# Patient Record
Sex: Male | Born: 1935 | Race: White | Hispanic: No | Marital: Married | Smoking: Former smoker
Health system: Southern US, Community
[De-identification: ages and names within clinical notes are randomized; demographics above are authoritative.]

## PROBLEM LIST (undated history)

## (undated) DIAGNOSIS — E039 Hypothyroidism, unspecified: Secondary | ICD-10-CM

## (undated) DIAGNOSIS — D649 Anemia, unspecified: Secondary | ICD-10-CM

## (undated) DIAGNOSIS — C801 Malignant (primary) neoplasm, unspecified: Secondary | ICD-10-CM

## (undated) DIAGNOSIS — F0390 Unspecified dementia without behavioral disturbance: Secondary | ICD-10-CM

---
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
## 2006-02-07 DIAGNOSIS — C801 Malignant (primary) neoplasm, unspecified: Secondary | ICD-10-CM

## 2006-02-07 HISTORY — DX: Malignant (primary) neoplasm, unspecified: C80.1

## 2015-12-08 DIAGNOSIS — E559 Vitamin D deficiency, unspecified: Secondary | ICD-10-CM | POA: Insufficient documentation

## 2015-12-08 DIAGNOSIS — E039 Hypothyroidism, unspecified: Secondary | ICD-10-CM | POA: Insufficient documentation

## 2015-12-09 DIAGNOSIS — R3129 Other microscopic hematuria: Secondary | ICD-10-CM | POA: Insufficient documentation

## 2015-12-09 DIAGNOSIS — Z66 Do not resuscitate: Secondary | ICD-10-CM | POA: Insufficient documentation

## 2015-12-09 DIAGNOSIS — R0989 Other specified symptoms and signs involving the circulatory and respiratory systems: Secondary | ICD-10-CM | POA: Insufficient documentation

## 2016-02-23 DIAGNOSIS — L309 Dermatitis, unspecified: Secondary | ICD-10-CM | POA: Insufficient documentation

## 2016-04-15 DIAGNOSIS — H6123 Impacted cerumen, bilateral: Secondary | ICD-10-CM | POA: Insufficient documentation

## 2017-05-09 DIAGNOSIS — K521 Toxic gastroenteritis and colitis: Secondary | ICD-10-CM | POA: Insufficient documentation

## 2017-05-09 DIAGNOSIS — T451X5A Adverse effect of antineoplastic and immunosuppressive drugs, initial encounter: Secondary | ICD-10-CM | POA: Insufficient documentation

## 2017-05-09 DIAGNOSIS — R7301 Impaired fasting glucose: Secondary | ICD-10-CM | POA: Insufficient documentation

## 2017-05-09 DIAGNOSIS — F0280 Dementia in other diseases classified elsewhere without behavioral disturbance: Secondary | ICD-10-CM | POA: Insufficient documentation

## 2018-01-12 DIAGNOSIS — Z7189 Other specified counseling: Secondary | ICD-10-CM | POA: Insufficient documentation

## 2018-02-26 ENCOUNTER — Telehealth: Payer: Self-pay | Admitting: Hematology

## 2018-02-26 NOTE — Telephone Encounter (Signed)
Pt's wife cld back to reschedule her husband Dr. Irene Limbo on 2/5 at 11am.

## 2018-02-26 NOTE — Telephone Encounter (Signed)
A new patient appt has been scheduled for the pt to see Dr. Irene Limbo on 2/4 at 11am. Appt date and time has been given to the pt's wife and aware to arrive 30 minutes early to be checked in on time.

## 2018-03-13 ENCOUNTER — Encounter: Payer: Self-pay | Admitting: Hematology

## 2018-03-13 NOTE — Progress Notes (Signed)
HEMATOLOGY/ONCOLOGY CONSULTATION NOTE  Date of Service: 03/14/2018  Patient Care Team: Patient, No Pcp Per as PCP - General (General Practice)  CHIEF COMPLAINTS/PURPOSE OF CONSULTATION:  Myelodysplastic Syndrome  Oncologic History:   Shaun Hobbs initially presented to care with pancytopenia and was evaluated with a BM Bx on 05/16/06 which did not reveal evidence of abnormal myeloid maturation or other abnormalities, and was without chromosomal abnormality as well. A BM Bx was repeated on 08/28/07 which did reveal dyserythropoiesis and some dysplasia. A 04/17/12 Bm biopsy revealed mild dysplasia, hypercellular marrow with 1.5% blasts, and a minute CD5 positive monoclonal B-cell population detected by flow. 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 His most recent 12/27/17 Bm Bx revealed 30% cellularity with trilineage hematopoiesis, mild megakaryocytic hyperplasia with dyspoietic changes, mild erythroid hyperplasia with ringed sideroblasts, and a minute population of monoclonal B-cells detected by flow.   The pt started 45m Revlimid for 3 months, beginning 09/03/10 "without noticeable improvement." He later began Vidaza, and completed his 21st cycle the week of November 30, 2017.  HISTORY OF PRESENTING ILLNESS:   Shaun GANAWAYis a wonderful 85y.o. male who [facility]el[place] Syndrome. He is accompanied today by his wife and daughter. The pt reports that he is doing well overall.  The pt's wife reports that the first thing that occurred related to the patient's MDS was that his PLT were seen to be reduced to 99k, 12 years ago. The pt was evaluated with repeat BM biopsies over the last 12 years, and began  Revlimid for only 3 months in 2012. The pt's wife notes that he began Vidaza a little less than two years ago, after his PLT dropped to about 60k, and his last dose of Vidaza was in late October 2019. The pt has had one blood transfusion ever, which was in November 2019. The pt's wife notes that during this time of not taking Vidaza, she has not noticed a change in her husband's energy levels or day to day activities. The pt denies dizziness, light headedness, fatigue, abnormal bruising, nose bleeds, gum bleeds, or other concerns for bleeding. The pt and wife deny recent or frequent infections.  The pt's wife notes that Vidaza was stopped to "take a break," trend his counts, [place] His last BM Bx was November 2019.  The pt and his wife note that there have not been other recent medical concerns. She notes that the pt's last A1C was 4.9.  The pt's wife notes that he is current with his annual flu vaccine, both every 5-year Prevnar and Pneumovax, and shingles vaccine.  The pt's wife notes that the pt's memory is "slowly decreasing," and he no longer drives. The pt's [facility]," and has taken Aricept for 20 years.  Most recent lab results (02/19/18) of CBC w/diff and CMP is as follows: all values are WNL except for WBC at 1.5k, RBC at 2.42, HGB at 7.9, HCT at 23.3, MCH at 32.2, PLT at 28k, RDW at 18.8, ANC at 700, Lymphocytes at 600, Monocytes at 200, Glucose at 111, BUN at 28.  On review of systems, pt  reports stable energy levels, eating well, stable weight, good appetite, and denies fevers, fatigue, light headedness, dizziness, nose bleeds, gum bleeds, abnormal bruising, other concerns for bleeding, falls, recent infections, frequent infections, bone pains, problems passing urine, abdominal pains, lower abdominal pains, and any other symptoms.  On PMHx the pt reports MDS, HLD, Early onset  Alzheimer's dementia, Hypothyroidism, hypertrophy or prostate with urinary obstruction.  MEDICAL HISTORY:  MDS, HLD, Early onset Alzheimer's dementia, Hypothyroidism, hypertrophy or prostate with urinary obstruction.  SURGICAL HISTORY: BM Bx  SOCIAL HISTORY: Social History   Socioeconomic History  . Marital status: Married    Spouse name: Not on file  . Number of children: Not on file  . Years of education: Not on file  . Highest education level: Not on file  Occupational History  . Not on file  Social Needs  . Financial resource strain: Not on file  . Food insecurity:    Worry: Not on file    Inability: Not on file  . Transportation needs:    Medical: Not on file    Non-medical: Not on file  Tobacco Use  . Smoking status: Not on file  Substance and Sexual Activity  . Alcohol use: Not on file  . Drug use: Not on file  . Sexual activity: Not on file  Lifestyle  . Physical activity:    Days per week: Not on file    Minutes per session: Not on file  . Stress: Not on file  Relationships  . Social connections:    Talks on phone: Not on file    Gets together: Not on file    Attends religious service: Not on file    Active member of club or organization: Not on file    Attends meetings of clubs or organizations: Not on file    Relationship status: Not on file  . Intimate partner violence:    Fear of current or ex partner: Not on file    Emotionally abused: Not on file    Physically abused: Not on file    Forced sexual activity: Not on file  Other Topics Concern  . Not on file  Social History Narrative  . Not on file    FAMILY HISTORY: No family history on file.  ALLERGIES:  has no allergies on file.  MEDICATIONS:  No current outpatient medications on file.   No current facility-administered medications for this visit.     REVIEW OF SYSTEMS:    10 Point review of Systems was done is negative except as noted above.  PHYSICAL EXAMINATION: ECOG  PERFORMANCE STATUS: 2-3   . Vitals:   03/14/18 1058  BP: (!) 109/52  Pulse: 82  Resp: 18  Temp: (!) 97.4 F (36.3 C)  SpO2: 100%   Filed Weights   03/14/18 1058  Weight: 167 lb 11.2 oz (76.1 kg)   .Body mass index is 26.27 kg/m.  GENERAL:alert, in no acute distress and comfortable SKIN: no acute rashes, no significant lesions EYES: conjunctiva are pink and non-injected, sclera anicteric OROPHARYNX: MMM, no exudates, no oropharyngeal erythema or ulceration NECK: supple, no JVD LYMPH:  no palpable lymphadenopathy in the cervical, axillary or inguinal regions LUNGS: clear to auscultation b/l with normal respiratory effort HEART: regular rate & rhythm ABDOMEN:  normoactive bowel sounds , non tender, not distended. Extremity: no pedal edema PSYCH: alert & oriented x 3 with fluent speech NEURO: no focal motor/sensory deficits  LABORATORY DATA:  I have reviewed the data as listed  .  No flowsheet data found.  .No flowsheet data found.  CBC w/diff and CMP:    11/19/15 BM Bx:   11/19/15 Cytogenetics:   12/27/17 Bm Bx:    RADIOGRAPHIC STUDIES: I have personally reviewed the radiological images as listed and agreed with the findings in the report. No results found.  ASSESSMENT & PLAN:  85 y.o. male with  1. 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 No evidence for a lymphoproliferative disorder. Normal cytogenetics. 08/28/07 BM Bx revealed mild dyserythropoiesis and a suggestion of some dysplasia within the myeloid series. 04/17/12 BM Bx revealed hypercellular marrow with 50% cellularity, 1.5% blasts, with trilineage hematopoiesis, marrow monocytosis, mild myeloid and megakaryocytic dysplasia, adequate storage and iron utilization, no ring sideroblasts.  Small monoclonal B lymphocytic population of uncertain significance. 11/19/15 BM Bx revealed Hypercellular marrow for age about 70% with panmyelosis including increased megakaryocytes with atypia, moderate marrow monocytosis about 15-20% and <5% blasts. Minute CD5 positive monoclonal B-cell population. Storage iron present. 12/27/17 BM Bx revealed normocellular marrow for age with 30% cellularity and trilineage hematopoiesis, mild megakaryocytic hyperplasia with dyspoietic changes, mild erythroid hyperplasia with ringed sideroblasts, and minute population of monoclonal B cells detected by flow.  S/p 21 cycles of Vidaza completed in the week of November 30, 2017  PLAN: -Discussed patient's most recent labs from 02/19/18, WBC at 1.5k, HGB at 7.9, PLT at 28k, [place] at 700 -Pt has not been transfusion dependent, and has not had frequent nor recent infections -Previous oncologic notes suggest that the patient's pancytopenia was continuing to worsen through [facility] treatment -Discussed that as the pt has completed 21 cycles of Vidaza without overt blood count improvement, does not have excess blasts, and has not been transfusion dependent, a supportive care approach could be indicated at this time as opposed to returning to [place] -The pt and his family agree with this suggestion, and we will continue to trend out his blood counts and offer supportive therapies and blood transfusions as needed -transfuse PRBC for hgb <+7 or if symptomatic and PLT<= 10k or if bleeding. -avoid NSAIDS and other medications that could lower platelet counts or affect platelet function. -Advised that the pt prioritize establishing care with a PCP locally -Advised that pt continue to stay up to date with flu vaccine, Prevnar, Pneumovax, and Shignles vaccines -Will order labs today -Will see the pt back in 6 weeks with labs   Labs today RTC with Dr Irene Limbo with labs in 6 weeks (with appointment for 1 unit of PRBC if needed)   All  of the patients questions were answered with apparent satisfaction. The patient knows to call the clinic with any problems, questions or concerns.  The total time spent in the appt was 65 minutes and more than 50% was on counseling and direct patient cares.    Sullivan Lone MD MS AAHIVMS [facility] Hematology/Oncology Physician [facility]  (Office):       [phone] (Work cell):  [phone] (Fax):           [phone]  03/14/2018 12:12 PM  I, Baldwin [place], am acting as a scribe for Dr. Sullivan Lone.   .I have reviewed the above documentation for accuracy and completeness, and I agree with the above. Brunetta Genera MD

## 2018-03-14 ENCOUNTER — Inpatient Hospital Stay: Payer: Medicare Other | Attending: Hematology | Admitting: Hematology

## 2018-03-14 ENCOUNTER — Telehealth: Payer: Self-pay | Admitting: Hematology

## 2018-03-14 ENCOUNTER — Inpatient Hospital Stay: Payer: Medicare Other

## 2018-03-14 VITALS — BP 109/52 | HR 82 | Temp 97.4°F | Resp 18 | Ht 67.0 in | Wt 167.7 lb

## 2018-03-14 DIAGNOSIS — D469 Myelodysplastic syndrome, unspecified: Secondary | ICD-10-CM

## 2018-03-14 DIAGNOSIS — D61818 Other pancytopenia: Secondary | ICD-10-CM

## 2018-03-14 LAB — SAMPLE TO BLOOD BANK

## 2018-03-14 LAB — LACTATE DEHYDROGENASE: LDH: 190 U/L (ref 98–192)

## 2018-03-14 LAB — CMP (CANCER CENTER ONLY)
ALT: 9 U/L (ref 0–44)
AST: 18 U/L (ref 15–41)
Albumin: 4.1 g/dL (ref 3.5–5.0)
Alkaline Phosphatase: 54 U/L (ref 38–126)
Anion gap: 6 (ref 5–15)
BUN: 28 mg/dL — ABNORMAL HIGH (ref 8–23)
CO2: 27 mmol/L (ref 22–32)
Calcium: 8.6 mg/dL — ABNORMAL LOW (ref 8.9–10.3)
Chloride: 108 mmol/L (ref 98–111)
Creatinine: 0.98 mg/dL (ref 0.61–1.24)
GFR, Est AFR Am: >60 mL/min (ref >60)
GFR, Estimated: >60 mL/min (ref >60)
Glucose, Bld: 134 mg/dL — ABNORMAL HIGH (ref 70–99)
Potassium: 4.4 mmol/L (ref 3.5–5.1)
Sodium: 141 mmol/L (ref 135–145)
Total Bilirubin: 0.7 mg/dL (ref 0.3–1.2)
Total Protein: 6.2 g/dL — ABNORMAL LOW (ref 6.5–8.1)

## 2018-03-14 LAB — CBC WITH DIFFERENTIAL/PLATELET
Abs Immature Granulocytes: 0 10*3/uL (ref 0.00–0.07)
Basophils Absolute: 0 10*3/uL (ref 0.0–0.1)
Basophils Relative: 0 %
Eosinophils Absolute: 0 10*3/uL (ref 0.0–0.5)
Eosinophils Relative: 1 %
HCT: 24.5 % — ABNORMAL LOW (ref 39.0–52.0)
Hemoglobin: 7.6 g/dL — ABNORMAL LOW (ref 13.0–17.0)
Immature Granulocytes: 0 %
Lymphocytes Relative: 39 %
Lymphs Abs: 0.6 10*3/uL — ABNORMAL LOW (ref 0.7–4.0)
MCH: 31.8 pg (ref 26.0–34.0)
MCHC: 31 g/dL (ref 30.0–36.0)
MCV: 102.5 fL — ABNORMAL HIGH (ref 80.0–100.0)
Monocytes Absolute: 0.2 10*3/uL (ref 0.1–1.0)
Monocytes Relative: 10 %
Neutro Abs: 0.8 10*3/uL — ABNORMAL LOW (ref 1.7–7.7)
Neutrophils Relative %: 50 %
Platelets: 12 10*3/uL — ABNORMAL LOW (ref 150–400)
RBC: 2.39 MIL/uL — ABNORMAL LOW (ref 4.22–5.81)
RDW: 23 % — ABNORMAL HIGH (ref 11.5–15.5)
WBC: 1.5 10*3/uL — ABNORMAL LOW (ref 4.0–10.5)
nRBC: 0 % (ref 0.0–0.2)

## 2018-03-14 LAB — RETICULOCYTES: RBC.: 2.39 MIL/uL — ABNORMAL LOW (ref 4.22–5.81)

## 2018-03-14 LAB — FERRITIN: Ferritin: 227 ng/mL (ref 24–336)

## 2018-03-14 LAB — IRON AND TIBC
Iron: 234 ug/dL — ABNORMAL HIGH (ref 42–163)
Saturation Ratios: 85 % — ABNORMAL HIGH (ref 20–55)
TIBC: 276 ug/dL (ref 202–409)
UIBC: 43 ug/dL — ABNORMAL LOW (ref 117–376)

## 2018-03-14 NOTE — Telephone Encounter (Signed)
Scheduled appt per 02/05 los. ° °Printed calendar and avs. °

## 2018-03-15 LAB — ERYTHROPOIETIN: Erythropoietin: 47.6 m[IU]/mL — ABNORMAL HIGH (ref 2.6–18.5)

## 2018-04-24 NOTE — Progress Notes (Signed)
HEMATOLOGY/ONCOLOGY CLINIC NOTE  Date of Service: 04/25/2018  Patient Care Team: Patient, No Pcp Per as PCP - General (General Practice)  CHIEF COMPLAINTS/PURPOSE OF CONSULTATION:  Myelodysplastic Syndrome  Oncologic History:   Shaun Hobbs initially presented to care with pancytopenia and was evaluated with a BM Bx on 05/16/06 which did not reveal evidence of abnormal myeloid maturation or other abnormalities, and was without chromosomal abnormality as well. A BM Bx was repeated on 08/28/07 which did reveal dyserythropoiesis and some dysplasia. A 04/17/12 Bm biopsy revealed mild dysplasia, hypercellular marrow with 1.5% blasts, and a minute CD5 positive monoclonal B-cell population detected by flow. 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 His most recent 12/27/17 Bm Bx revealed 30% cellularity with trilineage hematopoiesis, mild megakaryocytic hyperplasia with dyspoietic changes, mild erythroid hyperplasia with ringed sideroblasts, and a minute population of monoclonal B-cells detected by flow.   The pt started 68m Revlimid for 3 months, beginning 09/03/10 "without noticeable improvement." He later began Vidaza, and completed his 21st cycle the week of November 30, 2017.  HISTORY OF PRESENTING ILLNESS:   Shaun DOMANGUEis a wonderful 85y.o. male who [facility] [place] [facility] [place] [facility] [place]. He is accompanied today by his wife and daughter. The pt reports that he is doing well overall.  The pt's wife reports that the first thing that occurred related to the patient's MDS was that his PLT were seen to be reduced to 99k, 12 years ago. The pt was evaluated with repeat BM biopsies over the last 12 years, and began Revlimid  for only 3 months in 2012. The pt's wife notes that he began Vidaza a little less than two years ago, after his PLT dropped to about 60k, and his last dose of Vidaza was in late October 2019. The pt has had one blood transfusion ever, which was in November 2019. The pt's wife notes that during this time of not taking Vidaza, she has not noticed a change in her husband's energy levels or day to day activities. The pt denies dizziness, light headedness, fatigue, abnormal bruising, nose bleeds, gum bleeds, or other concerns for bleeding. The pt and wife deny recent or frequent infections.  The pt's wife notes that Vidaza was stopped to "take a break," trend his counts, and to [place] His last BM Bx was November 2019.  The pt and his wife note that there have not been other recent medical concerns. She notes that the pt's last A1C was 4.9.  The pt's wife notes that he is current with his annual flu vaccine, both every 5-year Prevnar and Pneumovax, and shingles vaccine.  The pt's wife notes that the pt's memory is "slowly decreasing," and he no longer drives. The pt's [facility]," and has taken Aricept for 20 years.  Most recent lab results (02/19/18) of CBC w/diff and CMP is as follows: all values are WNL except for WBC at 1.5k, RBC at 2.42, HGB at 7.9, HCT at 23.3, MCH at 32.2, PLT at 28k, RDW at 18.8, ANC at 700, Lymphocytes at 600, Monocytes at 200, Glucose at 111, BUN at 28.  On review of systems, pt  reports stable energy levels, eating well, stable weight, good appetite, and denies fevers, fatigue, light headedness, dizziness, nose bleeds, gum bleeds, abnormal bruising, other concerns for bleeding, falls, recent infections, frequent infections, bone pains, problems passing urine, abdominal pains, lower abdominal pains, and any other symptoms.  On PMHx the pt reports MDS, HLD, Early onset Alzheimer's  dementia, Hypothyroidism, hypertrophy or prostate with urinary obstruction.  Interval History:   Shaun Hobbs returns today for management and evaluation of his MDS. The patient's last visit with [place] was on 03/14/18. He is accompanied today by his wife and daughter. The pt reports that he is doing well overall.   The pt notes that he has not developed any new concerns. He denies new fatigue, light headedness, or dizziness. The pt also denies any nose bleeds, gum bleeds, or blood in the stools. The pt's wife reports that he was seen to have a drop of blood within his eye after seeing his ophthalmologist, but notes that there was not anything recommended for this and will continue following with ophthalmology.   The pt's wife notes that she has seen a decrease in the patient's energy levels and has had some SOB after walking a couple blocks. She notes that they walk regularly, but the pt has not been able to walk as far in the last 1-2 weeks.   The pt has not begun any new medications, and denies taking any NSAIDs.   The pt and his wife note that he eats well, and has walked outside every day.  Lab results today (04/25/18) of CBC w/diff and CMP is as follows: all values are WNL except for WBC at 1.1k, RBC at 2.02, HGB at 6.3, HCT at 21.3, MCV at 105.4, MCHC at 29.6, RDW at 26.2, PLT at 13k, ANC at 500, Lymphs abs at 500, Glucose at 143, Calcium at 8.5, Total Protein at 6.0, AST at 14.  On review of systems, pt reports eating well, continuing with mild activity, moving his bowels well, and denies light headedness, dizziness, concerns for bleeding, nose bleeds, gum bleeds, concerns for infection, recent infections, and any other symptoms.   MEDICAL HISTORY:  MDS, HLD, Early onset Alzheimer's dementia, Hypothyroidism, hypertrophy or prostate with urinary obstruction.  SURGICAL HISTORY: BM Bx  SOCIAL HISTORY: Social History   Socioeconomic History   Marital status: Married    Spouse name: Not  on file   Number of children: Not on file   Years of education: Not on file   Highest education level: Not on file  Occupational History   Not on file  Social Needs   Financial resource strain: Not on file   Food insecurity:    Worry: Not on file    Inability: Not on file   Transportation needs:    Medical: Not on file    Non-medical: Not on file  Tobacco Use   Smoking status: Not on file  Substance and Sexual Activity   Alcohol use: Not on file   Drug use: Not on file   Sexual activity: Not on file  Lifestyle   Physical activity:    Days per week: Not on file    Minutes per session: Not on file   Stress: Not on file  Relationships   Social connections:    Talks on phone: Not on file    Gets together: Not on file    Attends religious service: Not on file    Active member of club or organization: Not on file  Attends meetings of clubs or organizations: Not on file    Relationship status: Not on file   Intimate partner violence:    Fear of current or ex partner: Not on file    Emotionally abused: Not on file    Physically abused: Not on file    Forced sexual activity: Not on file  Other Topics Concern   Not on file  Social History Narrative   Not on file    FAMILY HISTORY: No family history on file.  ALLERGIES:  has no allergies on file.  MEDICATIONS:  No current outpatient medications on file.   No current facility-administered medications for this visit.     REVIEW OF SYSTEMS:    A 10+ POINT REVIEW OF SYSTEMS WAS OBTAINED including neurology, dermatology, psychiatry, cardiac, respiratory, lymph, extremities, GI, GU, Musculoskeletal, constitutional, breasts, reproductive, HEENT.  All pertinent positives are noted in the HPI.  All others are negative.   PHYSICAL EXAMINATION: ECOG PERFORMANCE STATUS: 2-3   . Vitals:   04/25/18 0944  BP: (!) 113/50  Pulse: 70  Resp: 17  Temp: 97.6 F (36.4 C)  SpO2: 100%   Filed Weights    04/25/18 0944  Weight: 167 lb 4.8 oz (75.9 kg)   .Body mass index is 26.2 kg/m.  GENERAL:alert, in no acute distress and comfortable SKIN: no acute rashes, no significant lesions EYES: conjunctiva are pink and non-injected, sclera anicteric OROPHARYNX: MMM, no exudates, no oropharyngeal erythema or ulceration NECK: supple, no JVD LYMPH:  no palpable lymphadenopathy in the cervical, axillary or inguinal regions LUNGS: clear to auscultation b/l with normal respiratory effort HEART: regular rate & rhythm ABDOMEN:  normoactive bowel sounds , non tender, not distended. No palpable hepatosplenomegaly.  Extremity: no pedal edema PSYCH: alert & oriented x 3 with fluent speech NEURO: no focal motor/sensory deficits   LABORATORY DATA:  I have reviewed the data as listed  . CBC Latest Ref Rng & Units 04/25/2018 03/14/2018  WBC 4.0 - 10.5 K/uL 1.1(L) 1.5(L)  Hemoglobin 13.0 - 17.0 g/dL 6.3(LL) 7.6(L)  Hematocrit 39.0 - 52.0 % 21.3(L) 24.5(L)  Platelets 150 - 400 K/uL 13(L) 12(L)   ANC 500 . CMP Latest Ref Rng & Units 04/25/2018 03/14/2018  Glucose 70 - 99 mg/dL 143(H) 134(H)  BUN 8 - 23 mg/dL 23 28(H)  Creatinine 0.61 - 1.24 mg/dL 1.01 0.98  Sodium 135 - 145 mmol/L 140 141  Potassium 3.5 - 5.1 mmol/L 4.2 4.4  Chloride 98 - 111 mmol/L 107 108  CO2 22 - 32 mmol/L 24 27  Calcium 8.9 - 10.3 mg/dL 8.5(L) 8.6(L)  Total Protein 6.5 - 8.1 g/dL 6.0(L) 6.2(L)  Total Bilirubin 0.3 - 1.2 mg/dL 0.9 0.7  Alkaline Phos 38 - 126 U/L 48 54  AST 15 - 41 U/L 14(L) 18  ALT 0 - 44 U/L 8 9    CBC w/diff and CMP:    11/19/15 BM Bx:   11/19/15 Cytogenetics:   12/27/17 Bm Bx:    RADIOGRAPHIC STUDIES: I have personally reviewed the radiological images as listed and agreed with the findings in the report. No results found.  ASSESSMENT & PLAN:  85 y.o. male with  1. Myelodysplastic syndrome - IPSS score of 2, with copy-neutral loss of heterozygosity in 7q consistent with myeloid lineage  clone  05/16/06 BM Bx indicated by pancytopenia but revealed no evidence of abnormal myeloid maturation or an increased blast population. No evidence for a lymphoproliferative disorder. Normal cytogenetics. 08/28/07 BM Bx revealed mild dyserythropoiesis  and a suggestion of some dysplasia within the myeloid series. 04/17/12 BM Bx revealed hypercellular marrow with 50% cellularity, 1.5% blasts, with trilineage hematopoiesis, marrow monocytosis, mild myeloid and megakaryocytic dysplasia, adequate storage and iron utilization, no ring sideroblasts. Small monoclonal B lymphocytic population of uncertain significance. 11/19/15 BM Bx revealed Hypercellular marrow for age about 70% with panmyelosis including increased megakaryocytes with atypia, moderate marrow monocytosis about 15-20% and <5% blasts. Minute CD5 positive monoclonal B-cell population. Storage iron present. 12/27/17 BM Bx revealed normocellular marrow for age with 30% cellularity and trilineage hematopoiesis, mild megakaryocytic hyperplasia with dyspoietic changes, mild erythroid hyperplasia with ringed sideroblasts, and minute population of monoclonal B cells detected by flow.  S/p 21 cycles of Vidaza completed in the week of November 30, 2017. Previous oncologic notes suggest that the patient's pancytopenia was continuing to worsen through [place] treatment.  Labs upon initial presentation from 02/19/18, WBC at 1.5k, HGB at 7.9, PLT at 28k, [place] at 700  PLAN: -Discussed pt labwork today, 04/25/18; HGB worsened to 6.3, PLT at 13k, ANC at 500 without recent infections -Will order 2 units PRBCs today -Will order 1 unit of platelets as well -Will continue with supportive cares as necessary -Recommend crowd avoidance and infection prevention strategies -Will consider prophylactic anti-microbials if ANC persists to decrease, but will continue to watch closely -Discussed that at any point, the pt and his family can choose to focus purely on comfort  measures with hospice services. -Pt has not been transfusion dependent, and has not had frequent nor recent infections -Discussed again that as the pt has completed 21 cycles of Vidaza without overt blood count improvement, does not have excess blasts, and has not been transfusion dependent, a supportive care approach could be indicated at this time as opposed to returning to [place]. The pt and his family agree with this suggestion, and we will continue to trend out his blood counts and offer supportive therapies and blood transfusions as needed -Transfuse PRBC for hgb <,=7 or if symptomatic and PLT<= 10k or if bleeding. -Avoid NSAIDS and other medications that could lower platelet counts or affect platelet function. -Advised that the pt prioritize establishing care with a PCP locally -Advised that pt continue to stay up to date with flu vaccine, Prevnar, Pneumovax, and Shignles vaccines -Will check labs again in 4 weeks with tentative appointment for blood transfusion  -Will see the pt back in 2 months   Labs and appointment for 2 units of PRBCs in 1 months RTC with Dr Irene Limbo with labs and appointment for 2 units of PRBCs in 2 months   All of the patients questions were answered with apparent satisfaction. The patient knows to call the clinic with any problems, questions or concerns.  The total time spent in the appt was 25 minutes and more than 50% was on counseling and direct patient cares.    Sullivan Lone MD MS AAHIVMS [facility] Hematology/Oncology Physician [facility]  (Office):       [phone] (Work cell):  [phone] (Fax):           [phone]  04/25/2018 10:29 AM  I, Baldwin Jamaica, am acting as a scribe for Dr. Sullivan Lone.   .I have reviewed the above documentation for accuracy and completeness, and I agree with the above. Brunetta Genera MD

## 2018-04-25 ENCOUNTER — Inpatient Hospital Stay: Payer: Medicare Other | Attending: Hematology | Admitting: Hematology

## 2018-04-25 ENCOUNTER — Inpatient Hospital Stay: Payer: Medicare Other

## 2018-04-25 ENCOUNTER — Other Ambulatory Visit: Payer: Self-pay

## 2018-04-25 ENCOUNTER — Other Ambulatory Visit: Payer: Self-pay | Admitting: *Deleted

## 2018-04-25 ENCOUNTER — Telehealth: Payer: Self-pay | Admitting: Hematology

## 2018-04-25 VITALS — BP 113/50 | HR 70 | Temp 97.6°F | Resp 17 | Ht 67.0 in | Wt 167.3 lb

## 2018-04-25 DIAGNOSIS — D696 Thrombocytopenia, unspecified: Secondary | ICD-10-CM

## 2018-04-25 DIAGNOSIS — D649 Anemia, unspecified: Secondary | ICD-10-CM

## 2018-04-25 DIAGNOSIS — D469 Myelodysplastic syndrome, unspecified: Secondary | ICD-10-CM

## 2018-04-25 DIAGNOSIS — D61818 Other pancytopenia: Secondary | ICD-10-CM

## 2018-04-25 DIAGNOSIS — E039 Hypothyroidism, unspecified: Secondary | ICD-10-CM | POA: Insufficient documentation

## 2018-04-25 LAB — CMP (CANCER CENTER ONLY)
ALT: 8 U/L (ref 0–44)
AST: 14 U/L — ABNORMAL LOW (ref 15–41)
Albumin: 4 g/dL (ref 3.5–5.0)
Alkaline Phosphatase: 48 U/L (ref 38–126)
Anion gap: 9 (ref 5–15)
BUN: 23 mg/dL (ref 8–23)
CO2: 24 mmol/L (ref 22–32)
Calcium: 8.5 mg/dL — ABNORMAL LOW (ref 8.9–10.3)
Chloride: 107 mmol/L (ref 98–111)
Creatinine: 1.01 mg/dL (ref 0.61–1.24)
GFR, Est AFR Am: >60 mL/min (ref >60)
GFR, Estimated: >60 mL/min (ref >60)
Glucose, Bld: 143 mg/dL — ABNORMAL HIGH (ref 70–99)
Potassium: 4.2 mmol/L (ref 3.5–5.1)
Sodium: 140 mmol/L (ref 135–145)
Total Bilirubin: 0.9 mg/dL (ref 0.3–1.2)
Total Protein: 6 g/dL — ABNORMAL LOW (ref 6.5–8.1)

## 2018-04-25 LAB — CBC WITH DIFFERENTIAL/PLATELET
Abs Immature Granulocytes: 0 10*3/uL (ref 0.00–0.07)
Basophils Absolute: 0 10*3/uL (ref 0.0–0.1)
Basophils Relative: 0 %
Eosinophils Absolute: 0 10*3/uL (ref 0.0–0.5)
Eosinophils Relative: 1 %
HCT: 21.3 % — ABNORMAL LOW (ref 39.0–52.0)
Hemoglobin: 6.3 g/dL — CL (ref 13.0–17.0)
Immature Granulocytes: 0 %
Lymphocytes Relative: 46 %
Lymphs Abs: 0.5 10*3/uL — ABNORMAL LOW (ref 0.7–4.0)
MCH: 31.2 pg (ref 26.0–34.0)
MCHC: 29.6 g/dL — ABNORMAL LOW (ref 30.0–36.0)
MCV: 105.4 fL — ABNORMAL HIGH (ref 80.0–100.0)
Monocytes Absolute: 0.1 10*3/uL (ref 0.1–1.0)
Monocytes Relative: 8 %
Neutro Abs: 0.5 10*3/uL — ABNORMAL LOW (ref 1.7–7.7)
Neutrophils Relative %: 45 %
Platelets: 13 10*3/uL — ABNORMAL LOW (ref 150–400)
RBC: 2.02 MIL/uL — ABNORMAL LOW (ref 4.22–5.81)
RDW: 26.2 % — ABNORMAL HIGH (ref 11.5–15.5)
WBC: 1.1 10*3/uL — ABNORMAL LOW (ref 4.0–10.5)
nRBC: 0 % (ref 0.0–0.2)

## 2018-04-25 LAB — SAMPLE TO BLOOD BANK

## 2018-04-25 LAB — ABO/RH: ABO/RH(D): A POS

## 2018-04-25 LAB — PREPARE RBC (CROSSMATCH)

## 2018-04-25 MED ORDER — SODIUM CHLORIDE 0.9% IV SOLUTION
250.0000 mL | Freq: Once | INTRAVENOUS | Status: AC
  Administered 2018-04-25: 250 mL via INTRAVENOUS
  Filled 2018-04-25: qty 250

## 2018-04-25 MED ORDER — DIPHENHYDRAMINE HCL 25 MG PO CAPS
25.0000 mg | ORAL_CAPSULE | Freq: Once | ORAL | Status: AC
  Administered 2018-04-25: 25 mg via ORAL

## 2018-04-25 MED ORDER — FUROSEMIDE 10 MG/ML IJ SOLN
20.0000 mg | Freq: Once | INTRAMUSCULAR | Status: AC
  Administered 2018-04-25: 20 mg via INTRAVENOUS

## 2018-04-25 MED ORDER — METHYLPREDNISOLONE SODIUM SUCC 40 MG IJ SOLR
INTRAMUSCULAR | Status: AC
  Filled 2018-04-25: qty 1

## 2018-04-25 MED ORDER — METHYLPREDNISOLONE SODIUM SUCC 40 MG IJ SOLR
40.0000 mg | Freq: Once | INTRAMUSCULAR | Status: AC
  Administered 2018-04-25: 40 mg via INTRAVENOUS

## 2018-04-25 MED ORDER — ACETAMINOPHEN 325 MG PO TABS
ORAL_TABLET | ORAL | Status: AC
  Filled 2018-04-25: qty 2

## 2018-04-25 MED ORDER — DIPHENHYDRAMINE HCL 25 MG PO CAPS
ORAL_CAPSULE | ORAL | Status: AC
  Filled 2018-04-25: qty 1

## 2018-04-25 MED ORDER — ACETAMINOPHEN 325 MG PO TABS
650.0000 mg | ORAL_TABLET | Freq: Once | ORAL | Status: AC
  Administered 2018-04-25: 650 mg via ORAL

## 2018-04-25 MED ORDER — FUROSEMIDE 10 MG/ML IJ SOLN
INTRAMUSCULAR | Status: AC
  Filled 2018-04-25: qty 2

## 2018-04-25 NOTE — Telephone Encounter (Signed)
Scheduled appt per 3/13 los. ° °Printed calendar and avs. °

## 2018-04-25 NOTE — Progress Notes (Signed)
Pt. Went to the Bathroom at 1530 while in the bathroom,Pt's wife stated that he pulled the IV out. Blood stopped, catheter was intact, 22g IV replaced by Douglass Rivers RN blood restarted at 1537.

## 2018-04-25 NOTE — Telephone Encounter (Signed)
Scheduled appt per 3/18 sch message - left message on daughters phone with apt date and time

## 2018-04-25 NOTE — Patient Instructions (Signed)
Thank you for choosing [facility] to provide your oncology and hematology care.  To afford each patient quality time with our providers, please arrive 30 minutes before your scheduled appointment time.  If you arrive late for your appointment, you may be asked to reschedule.  We strive to give you quality time with our providers, and arriving late affects you and other patients whose appointments are after yours.    If you are a no show for multiple scheduled visits, you may be dismissed from the clinic at the providers discretion.     Again, thank you for choosing [facility], our hope is that these requests will decrease the amount of time that you wait before being seen by our physicians.  ______________________________________________________________________   Should you have questions after your visit to the [facility], please contact our office at [phone] between the hours of 8:30 and 4:30 p.m.    Voicemails left after 4:30p.m will not be returned until the following business day.     For prescription refill requests, please have your pharmacy contact us directly.  Please also try to allow 48 hours for prescription requests.     Please contact the scheduling department for questions regarding scheduling.  For scheduling of procedures such as PET scans, CT scans, MRI, Ultrasound, etc please contact central scheduling at [phone].     Resources For Cancer Patients and Caregivers:    Oncolink.org:  A wonderful resource for patients and healthcare providers for information regarding your disease, ways to tract your treatment, what to expect, etc.      American Cancer Society:  800-227-2345  Can help patients locate various types of support and financial assistance   Cancer Care: 1-800-813-HOPE (4673) Provides financial assistance, online support groups, medication/co-pay assistance.     [place] DSS:  336-641-3447 Where to apply  for food stamps, Medicaid, and utility assistance   Medicare Rights Center: 800-333-4114 Helps people with Medicare understand their rights and benefits, navigate the Medicare system, and secure the quality healthcare they deserve   SCAT: 336-333-6589 [employer]'s shared-ride transportation service for eligible riders who have a disability that prevents them from riding the fixed route bus.     For additional information on assistance programs please contact our social worker:   Abigail Elmore:  [phone]  

## 2018-04-25 NOTE — Patient Instructions (Signed)
Blood Transfusion, Adult, Care After This sheet gives you information about how to care for yourself after your procedure. Your doctor may also give you more specific instructions. If you have problems or questions, contact your doctor. Follow these instructions at home:   Take over-the-counter and prescription medicines only as told by your doctor.  Go back to your normal activities as told by your doctor.  Follow instructions from your doctor about how to take care of the area where an IV tube was put into your vein (insertion site). Make sure you: ? Wash your hands with soap and water before you change your bandage (dressing). If there is no soap and water, use hand sanitizer. ? Change your bandage as told by your doctor.  Check your IV insertion site every day for signs of infection. Check for: ? More redness, swelling, or pain. ? More fluid or blood. ? Warmth. ? Pus or a bad smell. Contact a doctor if:  You have more redness, swelling, or pain around the IV insertion site.  You have more fluid or blood coming from the IV insertion site.  Your IV insertion site feels warm to the touch.  You have pus or a bad smell coming from the IV insertion site.  Your pee (urine) turns pink, red, or brown.  You feel weak after doing your normal activities. Get help right away if:  You have signs of a serious allergic or body defense (immune) system reaction, including: ? Itchiness. ? Hives. ? Trouble breathing. ? Anxiety. ? Pain in your chest or lower back. ? Fever, flushing, and chills. ? Fast pulse. ? Rash. ? Watery poop (diarrhea). ? Throwing up (vomiting). ? Dark pee. ? Serious headache. ? Dizziness. ? Stiff neck. ? Yellow color in your face or the white parts of your eyes (jaundice). Summary  After a blood transfusion, return to your normal activities as told by your doctor.  Every day, check for signs of infection where the IV tube was put into your vein.  Some  signs of infection are warm skin, more redness and pain, more fluid or blood, and pus or a bad smell where the needle went in.  Contact your doctor if you feel weak or have any unusual symptoms. This information is not intended to replace advice given to you by your health care provider. Make sure you discuss any questions you have with your health care provider. Document Released: 02/14/2014 Document Revised: 09/18/2015 Document Reviewed: 09/18/2015 Elsevier Interactive Patient Education  2019 [employer]    Platelet Transfusion A platelet transfusion is a procedure in which you receive donated platelets through an IV. Platelets are tiny pieces of blood cells. When you get an injury, platelets clump together in the area to form a blood clot. This helps stop bleeding and is the beginning of the healing process. If you have too few platelets, your blood may have trouble clotting. This may cause you to bleed and bruise very easily. You may need a platelet transfusion if you have a condition that causes a low number of platelets (thrombocytopenia). A platelet transfusion may be used to stop or prevent excessive bleeding. Tell a health care provider about:  Any reactions you have had during previous transfusions.  Any allergies you have.  All medicines you are taking, including vitamins, herbs, eye drops, creams, and over-the-counter medicines.  Any blood disorders you have.  Any surgeries you have had.  Any medical conditions you have.  Whether you are pregnant or   may be pregnant. What are the risks? Generally, this is a safe procedure. However, problems may occur, including:  Fever.  Infection.  Allergic reaction to the donor platelets.  Your body's disease-fighting system (immune system) attacking the donor platelets (hemolytic reaction). This is rare.  A rare reaction that causes lung damage (transfusion-related acute lung injury). What happens before the  procedure? Medicines  Ask your health care provider about: ? Changing or stopping your regular medicines. This is especially important if you are taking diabetes medicines or blood thinners. ? Taking medicines such as aspirin and ibuprofen. These medicines can thin your blood. Do not take these medicines unless your health care provider tells you to take them. ? Taking over-the-counter medicines, vitamins, herbs, and supplements. General instructions  You will have a blood test to determine your blood type. Your blood type determines what kind of platelets you will be given.  Follow instructions from your health care provider about eating or drinking restrictions.  If you have had an allergic reaction to a transfusion in the past, you may be given medicine to help prevent a reaction.  Your temperature, blood pressure, pulse, and breathing will be monitored. What happens during the procedure?   An IV will be inserted into one of your veins.  For your safety, two health care providers will verify your identity along with the donor platelets about to be infused.  A bag of donor platelets will be connected to your IV. The platelets will flow into your bloodstream. This usually takes 30-60 minutes.  Your temperature, blood pressure, pulse, and breathing will be monitored during the transfusion. This helps detect early signs of any reaction.  You will also be monitored for other symptoms that may indicate a reaction, including chills, hives, or itching.  If you have signs of a reaction at any time, your transfusion will be stopped, and you may be given medicine to help manage the reaction.  When your transfusion is complete, your IV will be removed.  Pressure may be applied to the IV site for a few minutes to stop any bleeding.  The IV site will be covered with a bandage (dressing). The procedure may vary among health care providers and hospitals. What happens after the  procedure?  Your blood pressure, temperature, pulse, and breathing will be monitored until you leave the hospital or clinic.  You may have some bruising and soreness at your IV site. Follow these instructions at home: Medicines  Take over-the-counter and prescription medicines only as told by your health care provider.  Talk with your health care provider before you take any medicines that contain aspirin or NSAIDs. These medicines increase your risk for dangerous bleeding. General instructions  Change or remove your dressing as told by your health care provider.  Return to your normal activities as told by your health care provider. Ask your health care provider what activities are safe for you.  Do not take baths, swim, or use a hot tub until your health care provider approves. Ask your health care provider if you may take showers.  Check your IV site every day for signs of infection. Check for: ? Redness, swelling, or pain. ? Fluid or blood. If fluid or blood drains from your IV site, use your hands to press down firmly on a bandage covering the area for a minute or two. Doing this should stop the bleeding. ? Warmth. ? Pus or a bad smell.  Keep all follow-up visits as told by your   health care provider. This is important. Contact a health care provider if you have:  A headache that does not go away with medicine.  Hives, rash, or itchy skin.  Nausea or vomiting.  Unusual tiredness or weakness.  Signs of infection at your IV site. Get help right away if:  You have a fever or chills.  You urinate less often than usual.  Your urine is darker colored than normal.  You have any of the following: ? Trouble breathing. ? Pain in your back, abdomen, or chest. ? Cool, clammy skin. ? A fast heartbeat. Summary  Platelets are tiny pieces of blood cells that clump together to form a blood clot when you have an injury. If you have too few platelets, your blood may have trouble  clotting.  A platelet transfusion is a procedure in which you receive donated platelets through an IV.  A platelet transfusion may be used to stop or prevent excessive bleeding.  After the procedure, check your IV site every day for signs of infection, including redness, swelling, pain, or warmth. This information is not intended to replace advice given to you by your health care provider. Make sure you discuss any questions you have with your health care provider. Document Released: 11/21/2006 Document Revised: 03/01/2017 Document Reviewed: 03/01/2017 Elsevier Interactive Patient Education  2019 [employer]  

## 2018-04-25 NOTE — Progress Notes (Signed)
Per Dr. Irene Limbo OK to run platelets in 30 minutes

## 2018-04-26 LAB — BPAM PLATELET PHERESIS
Blood Product Expiration Date: 202003202359
ISSUE DATE / TIME: 202003181155
Unit Type and Rh: 6200

## 2018-04-26 LAB — BPAM RBC
Blood Product Expiration Date: 202004102359
ISSUE DATE / TIME: 202003181153
Unit Type and Rh: 6200

## 2018-04-26 LAB — TYPE AND SCREEN
ABO/RH(D): A POS
Antibody Screen: NEGATIVE
Unit division: 0

## 2018-04-26 LAB — PREPARE PLATELET PHERESIS: Unit division: 0

## 2018-05-08 ENCOUNTER — Telehealth: Payer: Self-pay | Admitting: *Deleted

## 2018-05-08 NOTE — Telephone Encounter (Signed)
Wife called to confirm patient lab/transfusion appt for 4/1 and ask if Dr.Kale thought they needed to come. Per Dr. Irene Limbo, he recommends patient get labs checked for low hgb and receive transfusion if indicated. Wife asked if d/t husband's dx of Alzheimers that she be allowed to remain with patient. M.Rodgers, ADN, authorized wife to remain with spouse though [place] appts. Blood transfusion to take place in [facility] to minimize exposure. Wife advised to arrive early for appts. She verbalized understanding.

## 2018-05-09 ENCOUNTER — Inpatient Hospital Stay: Payer: Medicare Other

## 2018-05-09 ENCOUNTER — Other Ambulatory Visit: Payer: Self-pay

## 2018-05-09 ENCOUNTER — Other Ambulatory Visit: Payer: Self-pay | Admitting: Hematology

## 2018-05-09 ENCOUNTER — Other Ambulatory Visit: Payer: Self-pay | Admitting: Emergency Medicine

## 2018-05-09 ENCOUNTER — Inpatient Hospital Stay: Payer: Medicare Other | Attending: Hematology

## 2018-05-09 DIAGNOSIS — D469 Myelodysplastic syndrome, unspecified: Secondary | ICD-10-CM | POA: Diagnosis present

## 2018-05-09 DIAGNOSIS — D649 Anemia, unspecified: Secondary | ICD-10-CM

## 2018-05-09 LAB — CBC WITH DIFFERENTIAL (CANCER CENTER ONLY)
Abs Immature Granulocytes: 0 10*3/uL (ref 0.00–0.07)
Basophils Absolute: 0 10*3/uL (ref 0.0–0.1)
Basophils Relative: 0 %
Eosinophils Absolute: 0 10*3/uL (ref 0.0–0.5)
Eosinophils Relative: 0 %
HCT: 24.4 % — ABNORMAL LOW (ref 39.0–52.0)
Hemoglobin: 7.2 g/dL — ABNORMAL LOW (ref 13.0–17.0)
Immature Granulocytes: 0 %
Lymphocytes Relative: 53 %
Lymphs Abs: 0.6 10*3/uL — ABNORMAL LOW (ref 0.7–4.0)
MCH: 30 pg (ref 26.0–34.0)
MCHC: 29.5 g/dL — ABNORMAL LOW (ref 30.0–36.0)
MCV: 101.7 fL — ABNORMAL HIGH (ref 80.0–100.0)
Monocytes Absolute: 0.1 10*3/uL (ref 0.1–1.0)
Monocytes Relative: 7 %
Neutro Abs: 0.5 10*3/uL — ABNORMAL LOW (ref 1.7–7.7)
Neutrophils Relative %: 40 %
Platelet Count: 8 10*3/uL — CL (ref 150–400)
RBC: 2.4 MIL/uL — ABNORMAL LOW (ref 4.22–5.81)
RDW: 25 % — ABNORMAL HIGH (ref 11.5–15.5)
WBC Count: 1.2 10*3/uL — ABNORMAL LOW (ref 4.0–10.5)
nRBC: 0 % (ref 0.0–0.2)

## 2018-05-09 LAB — CMP (CANCER CENTER ONLY)
ALT: 7 U/L (ref 0–44)
AST: 14 U/L — ABNORMAL LOW (ref 15–41)
Albumin: 4 g/dL (ref 3.5–5.0)
Alkaline Phosphatase: 51 U/L (ref 38–126)
Anion gap: 8 (ref 5–15)
BUN: 23 mg/dL (ref 8–23)
CO2: 24 mmol/L (ref 22–32)
Calcium: 8.5 mg/dL — ABNORMAL LOW (ref 8.9–10.3)
Chloride: 106 mmol/L (ref 98–111)
Creatinine: 0.89 mg/dL (ref 0.61–1.24)
GFR, Est AFR Am: >60 mL/min (ref >60)
GFR, Estimated: >60 mL/min (ref >60)
Glucose, Bld: 109 mg/dL — ABNORMAL HIGH (ref 70–99)
Potassium: 4.2 mmol/L (ref 3.5–5.1)
Sodium: 138 mmol/L (ref 135–145)
Total Bilirubin: 0.7 mg/dL (ref 0.3–1.2)
Total Protein: 6.1 g/dL — ABNORMAL LOW (ref 6.5–8.1)

## 2018-05-09 LAB — SAMPLE TO BLOOD BANK

## 2018-05-09 LAB — PREPARE RBC (CROSSMATCH)

## 2018-05-09 MED ORDER — DIPHENHYDRAMINE HCL 25 MG PO CAPS
ORAL_CAPSULE | ORAL | Status: AC
  Filled 2018-05-09: qty 1

## 2018-05-09 MED ORDER — HEPARIN SOD (PORK) LOCK FLUSH 100 UNIT/ML IV SOLN
250.0000 [IU] | INTRAVENOUS | Status: DC | PRN
  Filled 2018-05-09: qty 5

## 2018-05-09 MED ORDER — HEPARIN SOD (PORK) LOCK FLUSH 100 UNIT/ML IV SOLN
500.0000 [IU] | Freq: Every day | INTRAVENOUS | Status: DC | PRN
  Filled 2018-05-09: qty 5

## 2018-05-09 MED ORDER — SODIUM CHLORIDE 0.9% FLUSH
10.0000 mL | INTRAVENOUS | Status: DC | PRN
  Filled 2018-05-09: qty 10

## 2018-05-09 MED ORDER — SODIUM CHLORIDE 0.9% IV SOLUTION
250.0000 mL | Freq: Once | INTRAVENOUS | Status: AC
  Administered 2018-05-09: 250 mL via INTRAVENOUS
  Filled 2018-05-09: qty 250

## 2018-05-09 MED ORDER — DIPHENHYDRAMINE HCL 25 MG PO CAPS
25.0000 mg | ORAL_CAPSULE | Freq: Once | ORAL | Status: AC
  Administered 2018-05-09: 25 mg via ORAL

## 2018-05-09 MED ORDER — ACETAMINOPHEN 325 MG PO TABS
650.0000 mg | ORAL_TABLET | Freq: Once | ORAL | Status: AC
  Administered 2018-05-09: 650 mg via ORAL

## 2018-05-09 MED ORDER — ACETAMINOPHEN 325 MG PO TABS
ORAL_TABLET | ORAL | Status: AC
  Filled 2018-05-09: qty 2

## 2018-05-09 MED ORDER — SODIUM CHLORIDE 0.9% FLUSH
3.0000 mL | INTRAVENOUS | Status: DC | PRN
  Filled 2018-05-09: qty 10

## 2018-05-09 NOTE — Patient Instructions (Signed)
Blood Transfusion, Adult, Care After This sheet gives you information about how to care for yourself after your procedure. Your doctor may also give you more specific instructions. If you have problems or questions, contact your doctor. Follow these instructions at home:   Take over-the-counter and prescription medicines only as told by your doctor.  Go back to your normal activities as told by your doctor.  Follow instructions from your doctor about how to take care of the area where an IV tube was put into your vein (insertion site). Make sure you: ? Wash your hands with soap and water before you change your bandage (dressing). If there is no soap and water, use hand sanitizer. ? Change your bandage as told by your doctor.  Check your IV insertion site every day for signs of infection. Check for: ? More redness, swelling, or pain. ? More fluid or blood. ? Warmth. ? Pus or a bad smell. Contact a doctor if:  You have more redness, swelling, or pain around the IV insertion site.  You have more fluid or blood coming from the IV insertion site.  Your IV insertion site feels warm to the touch.  You have pus or a bad smell coming from the IV insertion site.  Your pee (urine) turns pink, red, or brown.  You feel weak after doing your normal activities. Get help right away if:  You have signs of a serious allergic or body defense (immune) system reaction, including: ? Itchiness. ? Hives. ? Trouble breathing. ? Anxiety. ? Pain in your chest or lower back. ? Fever, flushing, and chills. ? Fast pulse. ? Rash. ? Watery poop (diarrhea). ? Throwing up (vomiting). ? Dark pee. ? Serious headache. ? Dizziness. ? Stiff neck. ? Yellow color in your face or the white parts of your eyes (jaundice). Summary  After a blood transfusion, return to your normal activities as told by your doctor.  Every day, check for signs of infection where the IV tube was put into your vein.  Some  signs of infection are warm skin, more redness and pain, more fluid or blood, and pus or a bad smell where the needle went in.  Contact your doctor if you feel weak or have any unusual symptoms. This information is not intended to replace advice given to you by your health care provider. Make sure you discuss any questions you have with your health care provider. Document Released: 02/14/2014 Document Revised: 09/18/2015 Document Reviewed: 09/18/2015 Elsevier Interactive Patient Education  2019 [employer]  

## 2018-05-09 NOTE — Progress Notes (Addendum)
Pt received 1 unit PRBCs today per MD Irene Limbo.  Tolerated well.  Ate and drank during transfusion without issue.  VSS.  Hx dementia, at baseline.  Pt had to be re-oriented multiple times but was calm & cooperative.  Pt signed blood consent during last transfusion in March 2020 with wife present, pt reports continued verbal understanding and consent of transfusion today.  Wife not present d/t visitor restrictions.  At end of transfusion pt was switched to saline to flush line.  Pt advised of line flush and told that RN Learta Codding would be going to restroom and be right back, call bell placed in his hand and door left open per pt request to see surroundings.  When RN returned after about a minute the pt had pulled out his IV without any memory of why or how he did it.  Pt was not aggressive or agitated, only stated he was ready to go home.  Applied pressure to area which showed swelling and bruising, bleeding stopped almost immediately.  PA Sandi Mealy called to assess site, applied new dressing w/pressure.  VS taken ,stable.  Pt denies any complaints or concerns or pain.  Per VO from PA Carrington new IV not started since pt had received all of transfusion and was starting his flush when IV was removed.  Pt cleaned and ambulated by PA Lucianne Lei and RN Learta Codding to exit where wife was waiting.  PA Lucianne Lei gave f/u instructions of how to care for site and what to call back for, wife VU of instructions and denies any further questions at this time.  MD Irene Limbo to be advised of incident by RN Sandi.

## 2018-05-10 LAB — TYPE AND SCREEN
ABO/RH(D): A POS
Antibody Screen: NEGATIVE
Unit division: 0

## 2018-05-10 LAB — BPAM RBC
Blood Product Expiration Date: 202004162359
ISSUE DATE / TIME: 202004011316
Unit Type and Rh: 6200

## 2018-05-24 ENCOUNTER — Telehealth: Payer: Self-pay | Admitting: *Deleted

## 2018-05-24 NOTE — Telephone Encounter (Signed)
Ms. Hubbert contacted office. Husband has poor memory and will require assistance during lab and transfusion appt on 4/17. She provided her cell#[phone].

## 2018-05-24 NOTE — Telephone Encounter (Signed)
Opened in error

## 2018-05-25 ENCOUNTER — Inpatient Hospital Stay: Payer: Medicare Other

## 2018-05-25 ENCOUNTER — Other Ambulatory Visit: Payer: Self-pay

## 2018-05-25 VITALS — BP 128/62 | HR 57 | Temp 97.7°F | Resp 18

## 2018-05-25 DIAGNOSIS — D61818 Other pancytopenia: Secondary | ICD-10-CM

## 2018-05-25 DIAGNOSIS — D469 Myelodysplastic syndrome, unspecified: Secondary | ICD-10-CM | POA: Diagnosis not present

## 2018-05-25 DIAGNOSIS — D649 Anemia, unspecified: Secondary | ICD-10-CM

## 2018-05-25 LAB — CBC WITH DIFFERENTIAL/PLATELET
Abs Immature Granulocytes: 0 10*3/uL (ref 0.00–0.07)
Basophils Absolute: 0 10*3/uL (ref 0.0–0.1)
Basophils Relative: 0 %
Eosinophils Absolute: 0 10*3/uL (ref 0.0–0.5)
Eosinophils Relative: 1 %
HCT: 26.8 % — ABNORMAL LOW (ref 39.0–52.0)
Hemoglobin: 8 g/dL — ABNORMAL LOW (ref 13.0–17.0)
Immature Granulocytes: 0 %
Lymphocytes Relative: 46 %
Lymphs Abs: 0.5 10*3/uL — ABNORMAL LOW (ref 0.7–4.0)
MCH: 30.1 pg (ref 26.0–34.0)
MCHC: 29.9 g/dL — ABNORMAL LOW (ref 30.0–36.0)
MCV: 100.8 fL — ABNORMAL HIGH (ref 80.0–100.0)
Monocytes Absolute: 0.1 10*3/uL (ref 0.1–1.0)
Monocytes Relative: 8 %
Neutro Abs: 0.5 10*3/uL — ABNORMAL LOW (ref 1.7–7.7)
Neutrophils Relative %: 45 %
Platelets: 8 10*3/uL — CL (ref 150–400)
RBC: 2.66 MIL/uL — ABNORMAL LOW (ref 4.22–5.81)
RDW: 23.8 % — ABNORMAL HIGH (ref 11.5–15.5)
WBC: 1.1 10*3/uL — ABNORMAL LOW (ref 4.0–10.5)
nRBC: 0 % (ref 0.0–0.2)

## 2018-05-25 LAB — CMP (CANCER CENTER ONLY)
ALT: 8 U/L (ref 0–44)
AST: 17 U/L (ref 15–41)
Albumin: 4 g/dL (ref 3.5–5.0)
Alkaline Phosphatase: 51 U/L (ref 38–126)
Anion gap: 7 (ref 5–15)
BUN: 21 mg/dL (ref 8–23)
CO2: 25 mmol/L (ref 22–32)
Calcium: 8.3 mg/dL — ABNORMAL LOW (ref 8.9–10.3)
Chloride: 108 mmol/L (ref 98–111)
Creatinine: 0.9 mg/dL (ref 0.61–1.24)
GFR, Est AFR Am: >60 mL/min (ref >60)
GFR, Estimated: >60 mL/min (ref >60)
Glucose, Bld: 132 mg/dL — ABNORMAL HIGH (ref 70–99)
Potassium: 4.4 mmol/L (ref 3.5–5.1)
Sodium: 140 mmol/L (ref 135–145)
Total Bilirubin: 0.5 mg/dL (ref 0.3–1.2)
Total Protein: 6.1 g/dL — ABNORMAL LOW (ref 6.5–8.1)

## 2018-05-25 LAB — SAMPLE TO BLOOD BANK

## 2018-05-25 MED ORDER — SODIUM CHLORIDE 0.9% IV SOLUTION
250.0000 mL | Freq: Once | INTRAVENOUS | Status: AC
  Administered 2018-05-25: 250 mL via INTRAVENOUS
  Filled 2018-05-25: qty 250

## 2018-05-25 MED ORDER — METHYLPREDNISOLONE SODIUM SUCC 40 MG IJ SOLR
INTRAMUSCULAR | Status: AC
  Filled 2018-05-25: qty 1

## 2018-05-25 MED ORDER — ACETAMINOPHEN 325 MG PO TABS
650.0000 mg | ORAL_TABLET | Freq: Once | ORAL | Status: AC
  Administered 2018-05-25: 650 mg via ORAL

## 2018-05-25 MED ORDER — DIPHENHYDRAMINE HCL 25 MG PO CAPS
ORAL_CAPSULE | ORAL | Status: AC
  Filled 2018-05-25: qty 1

## 2018-05-25 MED ORDER — DIPHENHYDRAMINE HCL 25 MG PO CAPS
25.0000 mg | ORAL_CAPSULE | Freq: Once | ORAL | Status: AC
  Administered 2018-05-25: 25 mg via ORAL

## 2018-05-25 MED ORDER — METHYLPREDNISOLONE SODIUM SUCC 40 MG IJ SOLR
40.0000 mg | Freq: Once | INTRAMUSCULAR | Status: AC
  Administered 2018-05-25: 40 mg via INTRAVENOUS

## 2018-05-25 MED ORDER — ACETAMINOPHEN 325 MG PO TABS
ORAL_TABLET | ORAL | Status: AC
  Filled 2018-05-25: qty 2

## 2018-05-25 NOTE — Patient Instructions (Signed)

## 2018-05-26 LAB — PREPARE PLATELET PHERESIS: Unit division: 0

## 2018-05-26 LAB — BPAM PLATELET PHERESIS
Blood Product Expiration Date: 202004192359
ISSUE DATE / TIME: 202004171037
Unit Type and Rh: 6200

## 2018-06-21 NOTE — Progress Notes (Signed)
HEMATOLOGY/ONCOLOGY CLINIC NOTE  Date of Service: 06/25/2018  Patient Care Team: Patient, No Pcp Per as PCP - General (General Practice)  CHIEF COMPLAINTS/PURPOSE OF CONSULTATION:  Myelodysplastic Syndrome  Oncologic History:   Shaun Hobbs initially presented to care with pancytopenia and was evaluated with a BM Bx on 05/16/06 which did not reveal evidence of abnormal myeloid maturation or other abnormalities, and was without chromosomal abnormality as well. A BM Bx was repeated on 08/28/07 which did reveal dyserythropoiesis and some dysplasia. A 04/17/12 Bm biopsy revealed mild dysplasia, hypercellular marrow with 1.5% blasts, and a minute CD5 positive monoclonal B-cell population detected by flow. 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 His most recent 12/27/17 Bm Bx revealed 30% cellularity with trilineage hematopoiesis, mild megakaryocytic hyperplasia with dyspoietic changes, mild erythroid hyperplasia with ringed sideroblasts, and a minute population of monoclonal B-cells detected by flow.   The pt started 5m Revlimid for 3 months, beginning 09/03/10 "without noticeable improvement." He later began Vidaza, and completed his 21st cycle the week of November 30, 2017.  HISTORY OF PRESENTING ILLNESS:   Shaun FILLis a wonderful 85y.o. male who [facility] [place] [facility] [place] [facility] [place]. He is accompanied today by his wife and daughter. The pt reports that he is doing well overall.  The pt's wife reports that the first thing that occurred related to the patient's MDS was that his PLT were seen to be reduced to 99k, 12 years ago. The pt was evaluated with repeat BM biopsies over the last 12 years, and began Revlimid  for only 3 months in 2012. The pt's wife notes that he began Vidaza a little less than two years ago, after his PLT dropped to about 60k, and his last dose of Vidaza was in late October 2019. The pt has had one blood transfusion ever, which was in November 2019. The pt's wife notes that during this time of not taking Vidaza, she has not noticed a change in her husband's energy levels or day to day activities. The pt denies dizziness, light headedness, fatigue, abnormal bruising, nose bleeds, gum bleeds, or other concerns for bleeding. The pt and wife deny recent or frequent infections.  The pt's wife notes that Vidaza was stopped to "take a break," trend his counts, and [place] His last BM Bx was November 2019.  The pt and his wife note that there have not been other recent medical concerns. She notes that the pt's last A1C was 4.9.  The pt's wife notes that he is current with his annual flu vaccine, both every 5-year Prevnar and Pneumovax, and shingles vaccine.  The pt's wife notes that the pt's memory is "slowly decreasing," and he no longer drives. The pt's [facility]," and has taken Aricept for 20 years.  Most recent lab results (02/19/18) of CBC w/diff and CMP is as follows: all values are WNL except for WBC at 1.5k, RBC at 2.42, HGB at 7.9, HCT at 23.3, MCH at 32.2, PLT at 28k, RDW at 18.8, ANC at 700, Lymphocytes at 600, Monocytes at 200, Glucose at 111, BUN at 28.  On review of systems, pt  reports stable energy levels, eating well, stable weight, good appetite, and denies fevers, fatigue, light headedness, dizziness, nose bleeds, gum bleeds, abnormal bruising, other concerns for bleeding, falls, recent infections, frequent infections, bone pains, problems passing urine, abdominal pains, lower abdominal pains, and any other symptoms.  On PMHx the pt reports MDS, HLD, Early onset Alzheimer's  dementia, Hypothyroidism, hypertrophy or prostate with urinary obstruction.  Interval History:   Shaun Hobbs returns today for management and evaluation of his MDS. The patient's last visit with [place] was on 04/25/18. The pt reports that he is doing well overall. He is accompanied today by his wife.  The pt's wife notes that the pt has been doing well. The pt notes that he does not have light headedness or dizziness, however his energy level is much reduced. The pt's wife notes that the pt has been only able to walk a half mile before getting fatigued, whereas his baseline is to walk a couple miles. Pt's wife denies concerns for bleeding.  Lab results today (06/25/18) of CBC w/diff and CMP is as follows: all values are WNL except for WBC at 1.1k, RBC at 2.41, HGB at 7.3, HCT at 24.0, RDW at 25.6, PLT at 8k, ANC at 500, Lymphs abs at 500, Glucose at 130, Calcium at 8.5, Total Protein at 6.0.  On review of systems, pt reports lower energy levels, eating well, lower activity levels, and denies concerns for bleeding, light headedness, dizziness, and any other symptoms.    MEDICAL HISTORY:  MDS, HLD, Early onset Alzheimer's dementia, Hypothyroidism, hypertrophy or prostate with urinary obstruction.  SURGICAL HISTORY: BM Bx  SOCIAL HISTORY: Social History   Socioeconomic History  . Marital status: Married    Spouse name: Not on file  . Number of children: Not on file  . Years of education: Not on file  . Highest education level: Not on file  Occupational History  . Not on file  Social Needs  . Financial resource strain: Not on file  . Food insecurity:    Worry: Not on file    Inability: Not on file  . Transportation needs:    Medical: Not on file    Non-medical: Not on file  Tobacco Use  . Smoking status: Not on file  Substance and Sexual Activity  . Alcohol use: Not on file  . Drug use: Not on file  . Sexual activity: Not on file  Lifestyle  . Physical activity:    Days per  week: Not on file    Minutes per session: Not on file  . Stress: Not on file  Relationships  . Social connections:    Talks on phone: Not on file    Gets together: Not on file    Attends religious service: Not on file    Active member of club or organization: Not on file    Attends meetings of clubs or organizations: Not on file    Relationship status: Not on file  . Intimate partner violence:    Fear of current or ex partner: Not on file    Emotionally abused: Not on file    Physically abused: Not on file    Forced sexual activity: Not on file  Other Topics Concern  . Not on file  Social History Narrative  . Not on file    FAMILY HISTORY: No family history on file.  ALLERGIES:  is allergic to sulfa antibiotics.  MEDICATIONS:  Current Outpatient Medications  Medication Sig Dispense Refill  .  Atorvastatin Calcium (LIPITOR PO) Take 5 mg by mouth at bedtime.    . Cholecalciferol (VITAMIN D3 PO) Take by mouth daily.    Marland Kitchen donepezil (ARICEPT) 10 MG tablet Take 10 mg by mouth at bedtime.    Marland Kitchen levothyroxine (SYNTHROID, LEVOTHROID) 100 MCG tablet Take 100 mcg by mouth daily before breakfast.    . MAGNESIUM PO Take by mouth.    . sertraline (ZOLOFT) 100 MG tablet Take 100 mg by mouth at bedtime.    . tamsulosin (FLOMAX) 0.4 MG CAPS capsule Take 0.4 mg by mouth at bedtime.     No current facility-administered medications for this visit.     REVIEW OF SYSTEMS:    A 10+ POINT REVIEW OF SYSTEMS WAS OBTAINED including neurology, dermatology, psychiatry, cardiac, respiratory, lymph, extremities, GI, GU, Musculoskeletal, constitutional, breasts, reproductive, HEENT.  All pertinent positives are noted in the HPI.  All others are negative.   PHYSICAL EXAMINATION: ECOG PERFORMANCE STATUS: 2-3   . Vitals:   06/25/18 0947  BP: (!) 113/47  Pulse: 74  Resp: 17  Temp: 98.5 F (36.9 C)  SpO2: 99%   Filed Weights   06/25/18 0947  Weight: 168 lb (76.2 kg)   .Body mass index is 26.31  kg/m.  GENERAL:alert, in no acute distress and comfortable SKIN: no acute rashes, no significant lesions EYES: conjunctiva are pink and non-injected, sclera anicteric OROPHARYNX: MMM, no exudates, no oropharyngeal erythema or ulceration NECK: supple, no JVD LYMPH:  no palpable lymphadenopathy in the cervical, axillary or inguinal regions LUNGS: clear to auscultation b/l with normal respiratory effort HEART: regular rate & rhythm ABDOMEN:  normoactive bowel sounds , non tender, not distended. No palpable hepatosplenomegaly.  Extremity: no pedal edema PSYCH: alert & oriented x 3 with fluent speech NEURO: no focal motor/sensory deficits   LABORATORY DATA:  I have reviewed the data as listed  . CBC Latest Ref Rng & Units 06/25/2018 05/25/2018 05/09/2018  WBC 4.0 - 10.5 K/uL 1.1(L) 1.1(L) 1.2(L)  Hemoglobin 13.0 - 17.0 g/dL 7.3(L) 8.0(L) 7.2(L)  Hematocrit 39.0 - 52.0 % 24.0(L) 26.8(L) 24.4(L)  Platelets 150 - 400 K/uL 8(LL) 8(LL) 8(LL)   ANC 500 . CMP Latest Ref Rng & Units 06/25/2018 05/25/2018 05/09/2018  Glucose 70 - 99 mg/dL 130(H) 132(H) 109(H)  BUN 8 - 23 mg/dL _0 Creatinine 0.61 - 1.24 mg/dL 0.97 0.90 0.89  Sodium 135 - 145 mmol/L 138 140 138  Potassium 3.5 - 5.1 mmol/L 4.1 4.4 4.2  Chloride 98 - 111 mmol/L 106 108 106  CO2 22 - 32 mmol/L _1 Calcium 8.9 - 10.3 mg/dL 8.5(L) 8.3(L) 8.5(L)  Total Protein 6.5 - 8.1 g/dL 6.0(L) 6.1(L) 6.1(L)  Total Bilirubin 0.3 - 1.2 mg/dL 0.7 0.5 0.7  Alkaline Phos 38 - 126 U/L 48 51 51  AST 15 - 41 U/L 17 17 14(L)  ALT 0 - 44 U/L _2 CBC w/diff and CMP:    11/19/15 BM Bx:   11/19/15 Cytogenetics:   12/27/17 Bm Bx:    RADIOGRAPHIC STUDIES: I have personally reviewed the radiological images as listed and agreed with the findings in the report. No results found.  ASSESSMENT & PLAN:  85 y.o. male with  1. Myelodysplastic syndrome - IPSS score of 2, with copy-neutral loss of heterozygosity in 7q consistent with  myeloid lineage clone  05/16/06 BM Bx indicated by pancytopenia but revealed no evidence of abnormal myeloid maturation or an increased blast population.  No evidence for a lymphoproliferative disorder. Normal cytogenetics. 08/28/07 BM Bx revealed mild dyserythropoiesis and a suggestion of some dysplasia within the myeloid series. 04/17/12 BM Bx revealed hypercellular marrow with 50% cellularity, 1.5% blasts, with trilineage hematopoiesis, marrow monocytosis, mild myeloid and megakaryocytic dysplasia, adequate storage and iron utilization, no ring sideroblasts. Small monoclonal B lymphocytic population of uncertain significance. 11/19/15 BM Bx revealed Hypercellular marrow for age about 70% with panmyelosis including increased megakaryocytes with atypia, moderate marrow monocytosis about 15-20% and <5% blasts. Minute CD5 positive monoclonal B-cell population. Storage iron present. 12/27/17 BM Bx revealed normocellular marrow for age with 30% cellularity and trilineage hematopoiesis, mild megakaryocytic hyperplasia with dyspoietic changes, mild erythroid hyperplasia with ringed sideroblasts, and minute population of monoclonal B cells detected by flow.  S/p 21 cycles of Vidaza completed in the week of November 30, 2017. Previous oncologic notes suggest that the patient's pancytopenia was continuing to worsen through [place] treatment.  Labs upon initial presentation from 02/19/18, WBC at 1.5k, HGB at 7.9, PLT at 28k, [place] at 700  PLAN: -Discussed pt labwork today, 06/25/18; ANC stable at 500, PLT stable at 8k, HGB low at 7.3 -Due to national blood shortage, transfusions are being limited to HGB<7 or with symptom burden -Pt is fatigued and weak with exertion but without light headedness or dizziness, will order one unit of PRBCs today -Will order one unit of PLT today  -If pt cannot receive PRBC transfusion today, will check labs again in 2 weeks for transfusion consideration -Will continue with supportive  cares as necessary -Recommend crowd avoidance and infection prevention strategies -Will consider prophylactic anti-microbials if ANC persists to decrease, but will continue to watch closely -Discussed that at any point, the pt and his family can choose to focus purely on comfort measures with hospice services. -Pt has not been transfusion dependent, and has not had frequent nor recent infections -Discussed again that as the pt has completed 21 cycles of Vidaza without overt blood count improvement, does not have excess blasts, and has not been transfusion dependent, a supportive care approach could be indicated at this time as opposed to returning to [place]. The pt and his family agree with this suggestion, and we will continue to trend out his blood counts and offer supportive therapies and blood transfusions as needed -Transfuse PRBC for hgb <,=7 or if symptomatic and PLT<= 10k or if bleeding. -Avoid NSAIDS and other medications that could lower platelet counts or affect platelet function. -Advised that the pt prioritize establishing care with a PCP locally -Advised that pt continue to stay up to date with flu vaccine, Prevnar, Pneumovax, and Shignles vaccines -Will see the pt back in 8 weeks   plz schedule for labs and PRBC/PLTS every 2 weeks x 4 RTC with Dr Irene Limbo in 8 weeks   All of the patients questions were answered with apparent satisfaction. The patient knows to call the clinic with any problems, questions or concerns.  The total time spent in the appt was 20 minutes and more than 50% was on counseling and direct patient cares.    Sullivan Lone MD MS AAHIVMS [facility] Hematology/Oncology Physician [facility]  (Office):       [phone] (Work cell):  [phone] (Fax):           [phone]  06/25/2018 10:44 AM  I, Baldwin Jamaica, am acting as a scribe for Dr. Sullivan Lone.   .I have reviewed the above documentation for accuracy and completeness, and I agree with  the above. Brunetta Genera MD

## 2018-06-25 ENCOUNTER — Inpatient Hospital Stay: Payer: Medicare Other

## 2018-06-25 ENCOUNTER — Other Ambulatory Visit: Payer: Self-pay

## 2018-06-25 ENCOUNTER — Other Ambulatory Visit: Payer: Self-pay | Admitting: *Deleted

## 2018-06-25 ENCOUNTER — Inpatient Hospital Stay (HOSPITAL_BASED_OUTPATIENT_CLINIC_OR_DEPARTMENT_OTHER): Payer: Medicare Other | Admitting: Hematology

## 2018-06-25 ENCOUNTER — Inpatient Hospital Stay: Payer: Medicare Other | Attending: Hematology

## 2018-06-25 VITALS — BP 113/47 | HR 74 | Temp 98.5°F | Resp 17 | Ht 67.0 in | Wt 168.0 lb

## 2018-06-25 DIAGNOSIS — D469 Myelodysplastic syndrome, unspecified: Secondary | ICD-10-CM | POA: Diagnosis present

## 2018-06-25 DIAGNOSIS — D649 Anemia, unspecified: Secondary | ICD-10-CM

## 2018-06-25 DIAGNOSIS — D61818 Other pancytopenia: Secondary | ICD-10-CM

## 2018-06-25 DIAGNOSIS — E039 Hypothyroidism, unspecified: Secondary | ICD-10-CM | POA: Diagnosis not present

## 2018-06-25 DIAGNOSIS — Z79899 Other long term (current) drug therapy: Secondary | ICD-10-CM | POA: Insufficient documentation

## 2018-06-25 DIAGNOSIS — D696 Thrombocytopenia, unspecified: Secondary | ICD-10-CM

## 2018-06-25 DIAGNOSIS — G300 Alzheimer's disease with early onset: Secondary | ICD-10-CM | POA: Insufficient documentation

## 2018-06-25 LAB — CMP (CANCER CENTER ONLY)
ALT: 10 U/L (ref 0–44)
AST: 17 U/L (ref 15–41)
Albumin: 4 g/dL (ref 3.5–5.0)
Alkaline Phosphatase: 48 U/L (ref 38–126)
Anion gap: 7 (ref 5–15)
BUN: 22 mg/dL (ref 8–23)
CO2: 25 mmol/L (ref 22–32)
Calcium: 8.5 mg/dL — ABNORMAL LOW (ref 8.9–10.3)
Chloride: 106 mmol/L (ref 98–111)
Creatinine: 0.97 mg/dL (ref 0.61–1.24)
GFR, Est AFR Am: >60 mL/min (ref >60)
GFR, Estimated: >60 mL/min (ref >60)
Glucose, Bld: 130 mg/dL — ABNORMAL HIGH (ref 70–99)
Potassium: 4.1 mmol/L (ref 3.5–5.1)
Sodium: 138 mmol/L (ref 135–145)
Total Bilirubin: 0.7 mg/dL (ref 0.3–1.2)
Total Protein: 6 g/dL — ABNORMAL LOW (ref 6.5–8.1)

## 2018-06-25 LAB — CBC WITH DIFFERENTIAL/PLATELET
Abs Immature Granulocytes: 0.03 10*3/uL (ref 0.00–0.07)
Basophils Absolute: 0 10*3/uL (ref 0.0–0.1)
Basophils Relative: 0 %
Eosinophils Absolute: 0 10*3/uL (ref 0.0–0.5)
Eosinophils Relative: 0 %
HCT: 24 % — ABNORMAL LOW (ref 39.0–52.0)
Hemoglobin: 7.3 g/dL — ABNORMAL LOW (ref 13.0–17.0)
Immature Granulocytes: 3 %
Lymphocytes Relative: 44 %
Lymphs Abs: 0.5 10*3/uL — ABNORMAL LOW (ref 0.7–4.0)
MCH: 30.3 pg (ref 26.0–34.0)
MCHC: 30.4 g/dL (ref 30.0–36.0)
MCV: 99.6 fL (ref 80.0–100.0)
Monocytes Absolute: 0.1 10*3/uL (ref 0.1–1.0)
Monocytes Relative: 8 %
Neutro Abs: 0.5 10*3/uL — ABNORMAL LOW (ref 1.7–7.7)
Neutrophils Relative %: 45 %
Platelets: 8 10*3/uL — CL (ref 150–400)
RBC: 2.41 MIL/uL — ABNORMAL LOW (ref 4.22–5.81)
RDW: 25.6 % — ABNORMAL HIGH (ref 11.5–15.5)
WBC: 1.1 10*3/uL — ABNORMAL LOW (ref 4.0–10.5)
nRBC: 0 % (ref 0.0–0.2)

## 2018-06-25 LAB — SAMPLE TO BLOOD BANK

## 2018-06-25 LAB — PREPARE RBC (CROSSMATCH)

## 2018-06-25 MED ORDER — ACETAMINOPHEN 325 MG PO TABS
ORAL_TABLET | ORAL | Status: AC
  Filled 2018-06-25: qty 2

## 2018-06-25 MED ORDER — DIPHENHYDRAMINE HCL 25 MG PO CAPS
25.0000 mg | ORAL_CAPSULE | Freq: Once | ORAL | Status: AC
  Administered 2018-06-25: 25 mg via ORAL

## 2018-06-25 MED ORDER — DIPHENHYDRAMINE HCL 25 MG PO CAPS
ORAL_CAPSULE | ORAL | Status: AC
  Filled 2018-06-25: qty 1

## 2018-06-25 MED ORDER — SODIUM CHLORIDE 0.9% IV SOLUTION
250.0000 mL | Freq: Once | INTRAVENOUS | Status: AC
  Administered 2018-06-25: 250 mL via INTRAVENOUS
  Filled 2018-06-25: qty 250

## 2018-06-25 MED ORDER — ACETAMINOPHEN 325 MG PO TABS
650.0000 mg | ORAL_TABLET | Freq: Once | ORAL | Status: AC
  Administered 2018-06-25: 650 mg via ORAL

## 2018-06-25 NOTE — Patient Instructions (Signed)
Platelet Transfusion A platelet transfusion is a procedure in which you receive donated platelets through an IV. Platelets are tiny pieces of blood cells. When you get an injury, platelets clump together in the area to form a blood clot. This helps stop bleeding and is the beginning of the healing process. If you have too few platelets, your blood may have trouble clotting. This may cause you to bleed and bruise very easily. You may need a platelet transfusion if you have a condition that causes a low number of platelets (thrombocytopenia). A platelet transfusion may be used to stop or prevent excessive bleeding. Tell a health care provider about:  Any reactions you have had during previous transfusions.  Any allergies you have.  All medicines you are taking, including vitamins, herbs, eye drops, creams, and over-the-counter medicines.  Any blood disorders you have.  Any surgeries you have had.  Any medical conditions you have.  Whether you are pregnant or may be pregnant. What are the risks? Generally, this is a safe procedure. However, problems may occur, including:  Fever.  Infection.  Allergic reaction to the donor platelets.  Your body's disease-fighting system (immune system) attacking the donor platelets (hemolytic reaction). This is rare.  A rare reaction that causes lung damage (transfusion-related acute lung injury). What happens before the procedure? Medicines  Ask your health care provider about: ? Changing or stopping your regular medicines. This is especially important if you are taking diabetes medicines or blood thinners. ? Taking medicines such as aspirin and ibuprofen. These medicines can thin your blood. Do not take these medicines unless your health care provider tells you to take them. ? Taking over-the-counter medicines, vitamins, herbs, and supplements. General instructions  You will have a blood test to determine your blood type. Your blood type  determines what kind of platelets you will be given.  Follow instructions from your health care provider about eating or drinking restrictions.  If you have had an allergic reaction to a transfusion in the past, you may be given medicine to help prevent a reaction.  Your temperature, blood pressure, pulse, and breathing will be monitored. What happens during the procedure?   An IV will be inserted into one of your veins.  For your safety, two health care providers will verify your identity along with the donor platelets about to be infused.  A bag of donor platelets will be connected to your IV. The platelets will flow into your bloodstream. This usually takes 30-60 minutes.  Your temperature, blood pressure, pulse, and breathing will be monitored during the transfusion. This helps detect early signs of any reaction.  You will also be monitored for other symptoms that may indicate a reaction, including chills, hives, or itching.  If you have signs of a reaction at any time, your transfusion will be stopped, and you may be given medicine to help manage the reaction.  When your transfusion is complete, your IV will be removed.  Pressure may be applied to the IV site for a few minutes to stop any bleeding.  The IV site will be covered with a bandage (dressing). The procedure may vary among health care providers and hospitals. What happens after the procedure?  Your blood pressure, temperature, pulse, and breathing will be monitored until you leave the hospital or clinic.  You may have some bruising and soreness at your IV site. Follow these instructions at home: Medicines  Take over-the-counter and prescription medicines only as told by your health care provider.  Talk with your health care provider before you take any medicines that contain aspirin or NSAIDs. These medicines increase your risk for dangerous bleeding. General instructions  Change or remove your dressing as told  by your health care provider.  Return to your normal activities as told by your health care provider. Ask your health care provider what activities are safe for you.  Do not take baths, swim, or use a hot tub until your health care provider approves. Ask your health care provider if you may take showers.  Check your IV site every day for signs of infection. Check for: ? Redness, swelling, or pain. ? Fluid or blood. If fluid or blood drains from your IV site, use your hands to press down firmly on a bandage covering the area for a minute or two. Doing this should stop the bleeding. ? Warmth. ? Pus or a bad smell.  Keep all follow-up visits as told by your health care provider. This is important. Contact a health care provider if you have:  A headache that does not go away with medicine.  Hives, rash, or itchy skin.  Nausea or vomiting.  Unusual tiredness or weakness.  Signs of infection at your IV site. Get help right away if:  You have a fever or chills.  You urinate less often than usual.  Your urine is darker colored than normal.  You have any of the following: ? Trouble breathing. ? Pain in your back, abdomen, or chest. ? Cool, clammy skin. ? A fast heartbeat. Summary  Platelets are tiny pieces of blood cells that clump together to form a blood clot when you have an injury. If you have too few platelets, your blood may have trouble clotting.  A platelet transfusion is a procedure in which you receive donated platelets through an IV.  A platelet transfusion may be used to stop or prevent excessive bleeding.  After the procedure, check your IV site every day for signs of infection, including redness, swelling, pain, or warmth. This information is not intended to replace advice given to you by your health care provider. Make sure you discuss any questions you have with your health care provider. Document Released: 11/21/2006 Document Revised: 03/01/2017 Document  Reviewed: 03/01/2017 Elsevier Interactive Patient Education  2019 [place].  Blood Transfusion, Adult, Care After This sheet gives you information about how to care for yourself after your procedure. Your doctor may also give you more specific instructions. If you have problems or questions, contact your doctor. Follow these instructions at home:   Take over-the-counter and prescription medicines only as told by your doctor.  Go back to your normal activities as told by your doctor.  Follow instructions from your doctor about how to take care of the area where an IV tube was put into your vein (insertion site). Make sure you: ? Wash your hands with soap and water before you change your bandage (dressing). If there is no soap and water, use hand sanitizer. ? Change your bandage as told by your doctor.  Check your IV insertion site every day for signs of infection. Check for: ? More redness, swelling, or pain. ? More fluid or blood. ? Warmth. ? Pus or a bad smell. Contact a doctor if:  You have more redness, swelling, or pain around the IV insertion site.  You have more fluid or blood coming from the IV insertion site.  Your IV insertion site feels warm to the touch.  You have pus or  a bad smell coming from the IV insertion site.  Your pee (urine) turns pink, red, or brown.  You feel weak after doing your normal activities. Get help right away if:  You have signs of a serious allergic or body defense (immune) system reaction, including: ? Itchiness. ? Hives. ? Trouble breathing. ? Anxiety. ? Pain in your chest or lower back. ? Fever, flushing, and chills. ? Fast pulse. ? Rash. ? Watery poop (diarrhea). ? Throwing up (vomiting). ? Dark pee. ? Serious headache. ? Dizziness. ? Stiff neck. ? Yellow color in your face or the white parts of your eyes (jaundice). Summary  After a blood transfusion, return to your normal activities as told by your doctor.  Every  day, check for signs of infection where the IV tube was put into your vein.  Some signs of infection are warm skin, more redness and pain, more fluid or blood, and pus or a bad smell where the needle went in.  Contact your doctor if you feel weak or have any unusual symptoms. This information is not intended to replace advice given to you by your health care provider. Make sure you discuss any questions you have with your health care provider. Document Released: 02/14/2014 Document Revised: 09/18/2015 Document Reviewed: 09/18/2015 Elsevier Interactive Patient Education  [employer].

## 2018-06-26 ENCOUNTER — Telehealth: Payer: Self-pay | Admitting: Hematology

## 2018-06-26 LAB — BPAM RBC
Blood Product Expiration Date: 202006072359
ISSUE DATE / TIME: 202005181142
Unit Type and Rh: 6200

## 2018-06-26 LAB — TYPE AND SCREEN
ABO/RH(D): A POS
Antibody Screen: NEGATIVE
Unit division: 0

## 2018-06-26 LAB — BPAM PLATELET PHERESIS
Blood Product Expiration Date: 202005192359
ISSUE DATE / TIME: 202005181357
Unit Type and Rh: 6200

## 2018-06-26 LAB — PREPARE PLATELET PHERESIS: Unit division: 0

## 2018-06-26 NOTE — Telephone Encounter (Signed)
Scheduled appt per 5/18 los.  House number is wrong on file and needas to be update, when I called the person stated I have the wrong number and they do not know anyone by the name of Suhail.  Left a voice message of scheduled appts on mobile.  A calendar will be mailed out.

## 2018-07-06 ENCOUNTER — Telehealth: Payer: Self-pay | Admitting: *Deleted

## 2018-07-06 NOTE — Telephone Encounter (Signed)
Wife called to ask if someone was being assigned to patient for his appt Monday 6/1 for labs/blood transfusion. Per wife, due to patient's dementia/confusion, he needs someone to stay with him. Information given to [employer], [facility]. She states she will authorize wife to remain w/patient during time at [facility] in interest of patient safety. Contacted wife with this information and she verbalized understanding. Added note to patient appointment.

## 2018-07-09 ENCOUNTER — Telehealth: Payer: Self-pay | Admitting: *Deleted

## 2018-07-09 ENCOUNTER — Other Ambulatory Visit: Payer: Self-pay | Admitting: *Deleted

## 2018-07-09 ENCOUNTER — Inpatient Hospital Stay: Payer: Medicare Other | Attending: Hematology

## 2018-07-09 ENCOUNTER — Other Ambulatory Visit: Payer: Self-pay

## 2018-07-09 ENCOUNTER — Inpatient Hospital Stay: Payer: Medicare Other

## 2018-07-09 DIAGNOSIS — D696 Thrombocytopenia, unspecified: Secondary | ICD-10-CM

## 2018-07-09 DIAGNOSIS — D469 Myelodysplastic syndrome, unspecified: Secondary | ICD-10-CM | POA: Diagnosis present

## 2018-07-09 DIAGNOSIS — D649 Anemia, unspecified: Secondary | ICD-10-CM

## 2018-07-09 LAB — SAMPLE TO BLOOD BANK

## 2018-07-09 LAB — CBC WITH DIFFERENTIAL/PLATELET
Abs Immature Granulocytes: 0 10*3/uL (ref 0.00–0.07)
Basophils Absolute: 0 10*3/uL (ref 0.0–0.1)
Basophils Relative: 0 %
Eosinophils Absolute: 0 10*3/uL (ref 0.0–0.5)
Eosinophils Relative: 1 %
HCT: 26.7 % — ABNORMAL LOW (ref 39.0–52.0)
Hemoglobin: 8 g/dL — ABNORMAL LOW (ref 13.0–17.0)
Immature Granulocytes: 0 %
Lymphocytes Relative: 50 %
Lymphs Abs: 0.5 10*3/uL — ABNORMAL LOW (ref 0.7–4.0)
MCH: 30.2 pg (ref 26.0–34.0)
MCHC: 30 g/dL (ref 30.0–36.0)
MCV: 100.8 fL — ABNORMAL HIGH (ref 80.0–100.0)
Monocytes Absolute: 0.1 10*3/uL (ref 0.1–1.0)
Monocytes Relative: 6 %
Neutro Abs: 0.4 10*3/uL — CL (ref 1.7–7.7)
Neutrophils Relative %: 43 %
Platelets: 6 10*3/uL — CL (ref 150–400)
RBC: 2.65 MIL/uL — ABNORMAL LOW (ref 4.22–5.81)
RDW: 23.7 % — ABNORMAL HIGH (ref 11.5–15.5)
WBC: 1 10*3/uL — ABNORMAL LOW (ref 4.0–10.5)
nRBC: 0 % (ref 0.0–0.2)

## 2018-07-09 MED ORDER — DIPHENHYDRAMINE HCL 25 MG PO CAPS
25.0000 mg | ORAL_CAPSULE | Freq: Once | ORAL | Status: AC
  Administered 2018-07-09: 25 mg via ORAL

## 2018-07-09 MED ORDER — ACETAMINOPHEN 325 MG PO TABS
650.0000 mg | ORAL_TABLET | Freq: Once | ORAL | Status: AC
  Administered 2018-07-09: 650 mg via ORAL

## 2018-07-09 MED ORDER — DIPHENHYDRAMINE HCL 25 MG PO CAPS
ORAL_CAPSULE | ORAL | Status: AC
  Filled 2018-07-09: qty 1

## 2018-07-09 MED ORDER — SODIUM CHLORIDE 0.9% FLUSH
10.0000 mL | INTRAVENOUS | Status: DC | PRN
  Filled 2018-07-09: qty 10

## 2018-07-09 MED ORDER — SODIUM CHLORIDE 0.9% IV SOLUTION
250.0000 mL | Freq: Once | INTRAVENOUS | Status: AC
  Administered 2018-07-09: 250 mL via INTRAVENOUS
  Filled 2018-07-09: qty 250

## 2018-07-09 MED ORDER — ACETAMINOPHEN 325 MG PO TABS
ORAL_TABLET | ORAL | Status: AC
  Filled 2018-07-09: qty 2

## 2018-07-09 NOTE — Patient Instructions (Signed)

## 2018-07-09 NOTE — Telephone Encounter (Signed)
Received critical value from lab.  ANC 04.  Message given to Inocencio Homes, RN with Dr. Irene Limbo.

## 2018-07-10 LAB — PREPARE PLATELET PHERESIS: Unit division: 0

## 2018-07-10 LAB — BPAM PLATELET PHERESIS
Blood Product Expiration Date: 202006032359
ISSUE DATE / TIME: 202006011016
Unit Type and Rh: 5100

## 2018-07-19 ENCOUNTER — Telehealth: Payer: Self-pay | Admitting: Hematology

## 2018-07-19 NOTE — Telephone Encounter (Signed)
Returned patient's phone call regarding rescheduling an appointment, left a voicemail. 

## 2018-07-20 ENCOUNTER — Telehealth: Payer: Self-pay | Admitting: *Deleted

## 2018-07-20 NOTE — Telephone Encounter (Signed)
Wife called with two requests: 1)She'd like to accompany her husband to his appointments on Monday d/t his diagnosis of Alzheimer's and 2) she needs to reschedule her husband's appt on 6/29 because they will be out of town.  Wife approved to stay with husband per [facility] AD for appointments on 6/15. Appointment note added.  Schedule message sent to change 6/29 appts to 6/26 (ok per Dr. Irene Limbo to move up a few days). Informed wife of all information and that scheduler will contact her. She verbalized understanding.

## 2018-07-23 ENCOUNTER — Inpatient Hospital Stay: Payer: Medicare Other

## 2018-07-23 ENCOUNTER — Other Ambulatory Visit: Payer: Self-pay

## 2018-07-23 ENCOUNTER — Telehealth: Payer: Self-pay | Admitting: Hematology

## 2018-07-23 ENCOUNTER — Other Ambulatory Visit: Payer: Self-pay | Admitting: *Deleted

## 2018-07-23 DIAGNOSIS — D469 Myelodysplastic syndrome, unspecified: Secondary | ICD-10-CM

## 2018-07-23 DIAGNOSIS — D649 Anemia, unspecified: Secondary | ICD-10-CM

## 2018-07-23 DIAGNOSIS — D696 Thrombocytopenia, unspecified: Secondary | ICD-10-CM

## 2018-07-23 DIAGNOSIS — D61818 Other pancytopenia: Secondary | ICD-10-CM

## 2018-07-23 LAB — CBC WITH DIFFERENTIAL/PLATELET
Abs Immature Granulocytes: 0.01 10*3/uL (ref 0.00–0.07)
Basophils Absolute: 0 10*3/uL (ref 0.0–0.1)
Basophils Relative: 0 %
Eosinophils Absolute: 0 10*3/uL (ref 0.0–0.5)
Eosinophils Relative: 0 %
HCT: 25 % — ABNORMAL LOW (ref 39.0–52.0)
Hemoglobin: 7.6 g/dL — ABNORMAL LOW (ref 13.0–17.0)
Immature Granulocytes: 1 %
Lymphocytes Relative: 51 %
Lymphs Abs: 0.7 10*3/uL (ref 0.7–4.0)
MCH: 30.2 pg (ref 26.0–34.0)
MCHC: 30.4 g/dL (ref 30.0–36.0)
MCV: 99.2 fL (ref 80.0–100.0)
Monocytes Absolute: 0.1 10*3/uL (ref 0.1–1.0)
Monocytes Relative: 9 %
Neutro Abs: 0.6 10*3/uL — ABNORMAL LOW (ref 1.7–7.7)
Neutrophils Relative %: 39 %
Platelets: 8 10*3/uL — CL (ref 150–400)
RBC: 2.52 MIL/uL — ABNORMAL LOW (ref 4.22–5.81)
RDW: 24.8 % — ABNORMAL HIGH (ref 11.5–15.5)
WBC: 1.4 10*3/uL — ABNORMAL LOW (ref 4.0–10.5)
nRBC: 0 % (ref 0.0–0.2)

## 2018-07-23 LAB — CMP (CANCER CENTER ONLY)
ALT: 11 U/L (ref 0–44)
AST: 17 U/L (ref 15–41)
Albumin: 4.2 g/dL (ref 3.5–5.0)
Alkaline Phosphatase: 52 U/L (ref 38–126)
Anion gap: 8 (ref 5–15)
BUN: 24 mg/dL — ABNORMAL HIGH (ref 8–23)
CO2: 23 mmol/L (ref 22–32)
Calcium: 8.5 mg/dL — ABNORMAL LOW (ref 8.9–10.3)
Chloride: 109 mmol/L (ref 98–111)
Creatinine: 0.89 mg/dL (ref 0.61–1.24)
GFR, Est AFR Am: >60 mL/min (ref >60)
GFR, Estimated: >60 mL/min (ref >60)
Glucose, Bld: 85 mg/dL (ref 70–99)
Potassium: 4.4 mmol/L (ref 3.5–5.1)
Sodium: 140 mmol/L (ref 135–145)
Total Bilirubin: 0.7 mg/dL (ref 0.3–1.2)
Total Protein: 6.1 g/dL — ABNORMAL LOW (ref 6.5–8.1)

## 2018-07-23 LAB — SAMPLE TO BLOOD BANK

## 2018-07-23 LAB — PREPARE RBC (CROSSMATCH)

## 2018-07-23 MED ORDER — DIPHENHYDRAMINE HCL 25 MG PO CAPS
25.0000 mg | ORAL_CAPSULE | Freq: Once | ORAL | Status: AC
  Administered 2018-07-23: 25 mg via ORAL

## 2018-07-23 MED ORDER — DIPHENHYDRAMINE HCL 25 MG PO CAPS
ORAL_CAPSULE | ORAL | Status: AC
  Filled 2018-07-23: qty 1

## 2018-07-23 MED ORDER — ACETAMINOPHEN 325 MG PO TABS
650.0000 mg | ORAL_TABLET | Freq: Once | ORAL | Status: AC
  Administered 2018-07-23: 650 mg via ORAL

## 2018-07-23 MED ORDER — SODIUM CHLORIDE 0.9% IV SOLUTION
250.0000 mL | Freq: Once | INTRAVENOUS | Status: AC
  Administered 2018-07-23: 250 mL via INTRAVENOUS
  Filled 2018-07-23: qty 250

## 2018-07-23 MED ORDER — ACETAMINOPHEN 325 MG PO TABS
ORAL_TABLET | ORAL | Status: AC
  Filled 2018-07-23: qty 2

## 2018-07-23 NOTE — Patient Instructions (Signed)
Platelet Transfusion A platelet transfusion is a procedure in which you receive donated platelets through an IV. Platelets are tiny pieces of blood cells. When you get an injury, platelets clump together in the area to form a blood clot. This helps stop bleeding and is the beginning of the healing process. If you have too few platelets, your blood may have trouble clotting. This may cause you to bleed and bruise very easily. You may need a platelet transfusion if you have a condition that causes a low number of platelets (thrombocytopenia). A platelet transfusion may be used to stop or prevent excessive bleeding. Tell a health care provider about:  Any reactions you have had during previous transfusions.  Any allergies you have.  All medicines you are taking, including vitamins, herbs, eye drops, creams, and over-the-counter medicines.  Any blood disorders you have.  Any surgeries you have had.  Any medical conditions you have.  Whether you are pregnant or may be pregnant. What are the risks? Generally, this is a safe procedure. However, problems may occur, including:  Fever.  Infection.  Allergic reaction to the donor platelets.  Your body's disease-fighting system (immune system) attacking the donor platelets (hemolytic reaction). This is rare.  A rare reaction that causes lung damage (transfusion-related acute lung injury). What happens before the procedure? Medicines  Ask your health care provider about: ? Changing or stopping your regular medicines. This is especially important if you are taking diabetes medicines or blood thinners. ? Taking medicines such as aspirin and ibuprofen. These medicines can thin your blood. Do not take these medicines unless your health care provider tells you to take them. ? Taking over-the-counter medicines, vitamins, herbs, and supplements. General instructions  You will have a blood test to determine your blood type. Your blood type  determines what kind of platelets you will be given.  Follow instructions from your health care provider about eating or drinking restrictions.  If you have had an allergic reaction to a transfusion in the past, you may be given medicine to help prevent a reaction.  Your temperature, blood pressure, pulse, and breathing will be monitored. What happens during the procedure?   An IV will be inserted into one of your veins.  For your safety, two health care providers will verify your identity along with the donor platelets about to be infused.  A bag of donor platelets will be connected to your IV. The platelets will flow into your bloodstream. This usually takes 30-60 minutes.  Your temperature, blood pressure, pulse, and breathing will be monitored during the transfusion. This helps detect early signs of any reaction.  You will also be monitored for other symptoms that may indicate a reaction, including chills, hives, or itching.  If you have signs of a reaction at any time, your transfusion will be stopped, and you may be given medicine to help manage the reaction.  When your transfusion is complete, your IV will be removed.  Pressure may be applied to the IV site for a few minutes to stop any bleeding.  The IV site will be covered with a bandage (dressing). The procedure may vary among health care providers and hospitals. What happens after the procedure?  Your blood pressure, temperature, pulse, and breathing will be monitored until you leave the hospital or clinic.  You may have some bruising and soreness at your IV site. Follow these instructions at home: Medicines  Take over-the-counter and prescription medicines only as told by your health care provider.  Talk with your health care provider before you take any medicines that contain aspirin or NSAIDs. These medicines increase your risk for dangerous bleeding. General instructions  Change or remove your dressing as told  by your health care provider.  Return to your normal activities as told by your health care provider. Ask your health care provider what activities are safe for you.  Do not take baths, swim, or use a hot tub until your health care provider approves. Ask your health care provider if you may take showers.  Check your IV site every day for signs of infection. Check for: ? Redness, swelling, or pain. ? Fluid or blood. If fluid or blood drains from your IV site, use your hands to press down firmly on a bandage covering the area for a minute or two. Doing this should stop the bleeding. ? Warmth. ? Pus or a bad smell.  Keep all follow-up visits as told by your health care provider. This is important. Contact a health care provider if you have:  A headache that does not go away with medicine.  Hives, rash, or itchy skin.  Nausea or vomiting.  Unusual tiredness or weakness.  Signs of infection at your IV site. Get help right away if:  You have a fever or chills.  You urinate less often than usual.  Your urine is darker colored than normal.  You have any of the following: ? Trouble breathing. ? Pain in your back, abdomen, or chest. ? Cool, clammy skin. ? A fast heartbeat. Summary  Platelets are tiny pieces of blood cells that clump together to form a blood clot when you have an injury. If you have too few platelets, your blood may have trouble clotting.  A platelet transfusion is a procedure in which you receive donated platelets through an IV.  A platelet transfusion may be used to stop or prevent excessive bleeding.  After the procedure, check your IV site every day for signs of infection, including redness, swelling, pain, or warmth. This information is not intended to replace advice given to you by your health care provider. Make sure you discuss any questions you have with your health care provider. Document Released: 11/21/2006 Document Revised: 03/01/2017 Document  Reviewed: 03/01/2017 Elsevier Interactive Patient Education  2019 [place].  Blood Transfusion, Adult, Care After This sheet gives you information about how to care for yourself after your procedure. Your doctor may also give you more specific instructions. If you have problems or questions, contact your doctor. Follow these instructions at home:   Take over-the-counter and prescription medicines only as told by your doctor.  Go back to your normal activities as told by your doctor.  Follow instructions from your doctor about how to take care of the area where an IV tube was put into your vein (insertion site). Make sure you: ? Wash your hands with soap and water before you change your bandage (dressing). If there is no soap and water, use hand sanitizer. ? Change your bandage as told by your doctor.  Check your IV insertion site every day for signs of infection. Check for: ? More redness, swelling, or pain. ? More fluid or blood. ? Warmth. ? Pus or a bad smell. Contact a doctor if:  You have more redness, swelling, or pain around the IV insertion site.  You have more fluid or blood coming from the IV insertion site.  Your IV insertion site feels warm to the touch.  You have pus or  a bad smell coming from the IV insertion site.  Your pee (urine) turns pink, red, or brown.  You feel weak after doing your normal activities. Get help right away if:  You have signs of a serious allergic or body defense (immune) system reaction, including: ? Itchiness. ? Hives. ? Trouble breathing. ? Anxiety. ? Pain in your chest or lower back. ? Fever, flushing, and chills. ? Fast pulse. ? Rash. ? Watery poop (diarrhea). ? Throwing up (vomiting). ? Dark pee. ? Serious headache. ? Dizziness. ? Stiff neck. ? Yellow color in your face or the white parts of your eyes (jaundice). Summary  After a blood transfusion, return to your normal activities as told by your doctor.  Every  day, check for signs of infection where the IV tube was put into your vein.  Some signs of infection are warm skin, more redness and pain, more fluid or blood, and pus or a bad smell where the needle went in.  Contact your doctor if you feel weak or have any unusual symptoms. This information is not intended to replace advice given to you by your health care provider. Make sure you discuss any questions you have with your health care provider. Document Released: 02/14/2014 Document Revised: 09/18/2015 Document Reviewed: 09/18/2015 Elsevier Interactive Patient Education  [employer].

## 2018-07-23 NOTE — Telephone Encounter (Signed)
Left message on cell re moving 6/29 appointments to 6/26. Home number incorrect. Added comment for registration to update demographic information. Schedule mailed.

## 2018-07-24 LAB — BPAM PLATELET PHERESIS
Blood Product Expiration Date: 202006162359
ISSUE DATE / TIME: 202006151447
Unit Type and Rh: 6200

## 2018-07-24 LAB — PREPARE PLATELET PHERESIS: Unit division: 0

## 2018-07-24 LAB — TYPE AND SCREEN
ABO/RH(D): A POS
Antibody Screen: NEGATIVE
Unit division: 0

## 2018-07-24 LAB — BPAM RBC
Blood Product Expiration Date: 202007012359
ISSUE DATE / TIME: 202006151431
Unit Type and Rh: 6200

## 2018-07-25 ENCOUNTER — Telehealth: Payer: Self-pay | Admitting: Hematology

## 2018-07-25 NOTE — Telephone Encounter (Signed)
Returned call to wife re moving 6/26 appointments from PM to AM. Confirmed change with wife for 6/26@ 7:30 am.

## 2018-08-03 ENCOUNTER — Other Ambulatory Visit: Payer: Self-pay | Admitting: *Deleted

## 2018-08-03 ENCOUNTER — Other Ambulatory Visit: Payer: Self-pay

## 2018-08-03 ENCOUNTER — Inpatient Hospital Stay: Payer: Medicare Other

## 2018-08-03 ENCOUNTER — Ambulatory Visit: Payer: Medicare Other

## 2018-08-03 ENCOUNTER — Other Ambulatory Visit: Payer: Medicare Other

## 2018-08-03 ENCOUNTER — Telehealth: Payer: Self-pay | Admitting: *Deleted

## 2018-08-03 DIAGNOSIS — D649 Anemia, unspecified: Secondary | ICD-10-CM

## 2018-08-03 DIAGNOSIS — D469 Myelodysplastic syndrome, unspecified: Secondary | ICD-10-CM

## 2018-08-03 DIAGNOSIS — D696 Thrombocytopenia, unspecified: Secondary | ICD-10-CM

## 2018-08-03 LAB — CBC WITH DIFFERENTIAL/PLATELET
Abs Immature Granulocytes: 0.02 10*3/uL (ref 0.00–0.07)
Basophils Absolute: 0 10*3/uL (ref 0.0–0.1)
Basophils Relative: 0 %
Eosinophils Absolute: 0 10*3/uL (ref 0.0–0.5)
Eosinophils Relative: 0 %
HCT: 28.7 % — ABNORMAL LOW (ref 39.0–52.0)
Hemoglobin: 8.8 g/dL — ABNORMAL LOW (ref 13.0–17.0)
Immature Granulocytes: 2 %
Lymphocytes Relative: 45 %
Lymphs Abs: 0.6 10*3/uL — ABNORMAL LOW (ref 0.7–4.0)
MCH: 29.7 pg (ref 26.0–34.0)
MCHC: 30.7 g/dL (ref 30.0–36.0)
MCV: 97 fL (ref 80.0–100.0)
Monocytes Absolute: 0.1 10*3/uL (ref 0.1–1.0)
Monocytes Relative: 9 %
Neutro Abs: 0.6 10*3/uL — ABNORMAL LOW (ref 1.7–7.7)
Neutrophils Relative %: 44 %
Platelets: 5 10*3/uL — CL (ref 150–400)
RBC: 2.96 MIL/uL — ABNORMAL LOW (ref 4.22–5.81)
RDW: 23.7 % — ABNORMAL HIGH (ref 11.5–15.5)
WBC: 1.3 10*3/uL — ABNORMAL LOW (ref 4.0–10.5)
nRBC: 0 % (ref 0.0–0.2)

## 2018-08-03 LAB — TYPE AND SCREEN
ABO/RH(D): A POS
Antibody Screen: NEGATIVE

## 2018-08-03 LAB — SAMPLE TO BLOOD BANK

## 2018-08-03 MED ORDER — ACETAMINOPHEN 325 MG PO TABS
ORAL_TABLET | ORAL | Status: AC
  Filled 2018-08-03: qty 2

## 2018-08-03 MED ORDER — ACETAMINOPHEN 325 MG PO TABS
650.0000 mg | ORAL_TABLET | Freq: Once | ORAL | Status: AC
  Administered 2018-08-03: 09:00:00 650 mg via ORAL

## 2018-08-03 MED ORDER — DIPHENHYDRAMINE HCL 25 MG PO CAPS
25.0000 mg | ORAL_CAPSULE | Freq: Once | ORAL | Status: AC
  Administered 2018-08-03: 25 mg via ORAL

## 2018-08-03 MED ORDER — SODIUM CHLORIDE 0.9% IV SOLUTION
250.0000 mL | Freq: Once | INTRAVENOUS | Status: AC
  Administered 2018-08-03: 250 mL via INTRAVENOUS
  Filled 2018-08-03: qty 250

## 2018-08-03 MED ORDER — DIPHENHYDRAMINE HCL 25 MG PO CAPS
ORAL_CAPSULE | ORAL | Status: AC
  Filled 2018-08-03: qty 1

## 2018-08-03 NOTE — Telephone Encounter (Signed)
Critical value reported by lab @ 8:10am - platelets 5,000. Report given to Dr. Irene Limbo 8:12am. Verbal order received for 1 unit platelets. No PRBCs today Hgb 8.8.

## 2018-08-03 NOTE — Patient Instructions (Signed)
Platelet Transfusion A platelet transfusion is a procedure in which you receive donated platelets through an IV. Platelets are tiny pieces of blood cells. When you get an injury, platelets clump together in the area to form a blood clot. This helps stop bleeding and is the beginning of the healing process. If you have too few platelets, your blood may have trouble clotting. This may cause you to bleed and bruise very easily. You may need a platelet transfusion if you have a condition that causes a low number of platelets (thrombocytopenia). A platelet transfusion may be used to stop or prevent excessive bleeding. Tell a health care provider about:  Any reactions you have had during previous transfusions.  Any allergies you have.  All medicines you are taking, including vitamins, herbs, eye drops, creams, and over-the-counter medicines.  Any blood disorders you have.  Any surgeries you have had.  Any medical conditions you have.  Whether you are pregnant or may be pregnant. What are the risks? Generally, this is a safe procedure. However, problems may occur, including:  Fever.  Infection.  Allergic reaction to the donor platelets.  Your body's disease-fighting system (immune system) attacking the donor platelets (hemolytic reaction). This is rare.  A rare reaction that causes lung damage (transfusion-related acute lung injury). What happens before the procedure? Medicines  Ask your health care provider about: ? Changing or stopping your regular medicines. This is especially important if you are taking diabetes medicines or blood thinners. ? Taking medicines such as aspirin and ibuprofen. These medicines can thin your blood. Do not take these medicines unless your health care provider tells you to take them. ? Taking over-the-counter medicines, vitamins, herbs, and supplements. General instructions  You will have a blood test to determine your blood type. Your blood type  determines what kind of platelets you will be given.  Follow instructions from your health care provider about eating or drinking restrictions.  If you have had an allergic reaction to a transfusion in the past, you may be given medicine to help prevent a reaction.  Your temperature, blood pressure, pulse, and breathing will be monitored. What happens during the procedure?   An IV will be inserted into one of your veins.  For your safety, two health care providers will verify your identity along with the donor platelets about to be infused.  A bag of donor platelets will be connected to your IV. The platelets will flow into your bloodstream. This usually takes 30-60 minutes.  Your temperature, blood pressure, pulse, and breathing will be monitored during the transfusion. This helps detect early signs of any reaction.  You will also be monitored for other symptoms that may indicate a reaction, including chills, hives, or itching.  If you have signs of a reaction at any time, your transfusion will be stopped, and you may be given medicine to help manage the reaction.  When your transfusion is complete, your IV will be removed.  Pressure may be applied to the IV site for a few minutes to stop any bleeding.  The IV site will be covered with a bandage (dressing). The procedure may vary among health care providers and hospitals. What happens after the procedure?  Your blood pressure, temperature, pulse, and breathing will be monitored until you leave the hospital or clinic.  You may have some bruising and soreness at your IV site. Follow these instructions at home: Medicines  Take over-the-counter and prescription medicines only as told by your health care provider.  Talk with your health care provider before you take any medicines that contain aspirin or NSAIDs. These medicines increase your risk for dangerous bleeding. General instructions  Change or remove your dressing as told  by your health care provider.  Return to your normal activities as told by your health care provider. Ask your health care provider what activities are safe for you.  Do not take baths, swim, or use a hot tub until your health care provider approves. Ask your health care provider if you may take showers.  Check your IV site every day for signs of infection. Check for: ? Redness, swelling, or pain. ? Fluid or blood. If fluid or blood drains from your IV site, use your hands to press down firmly on a bandage covering the area for a minute or two. Doing this should stop the bleeding. ? Warmth. ? Pus or a bad smell.  Keep all follow-up visits as told by your health care provider. This is important. Contact a health care provider if you have:  A headache that does not go away with medicine.  Hives, rash, or itchy skin.  Nausea or vomiting.  Unusual tiredness or weakness.  Signs of infection at your IV site. Get help right away if:  You have a fever or chills.  You urinate less often than usual.  Your urine is darker colored than normal.  You have any of the following: ? Trouble breathing. ? Pain in your back, abdomen, or chest. ? Cool, clammy skin. ? A fast heartbeat. Summary  Platelets are tiny pieces of blood cells that clump together to form a blood clot when you have an injury. If you have too few platelets, your blood may have trouble clotting.  A platelet transfusion is a procedure in which you receive donated platelets through an IV.  A platelet transfusion may be used to stop or prevent excessive bleeding.  After the procedure, check your IV site every day for signs of infection, including redness, swelling, pain, or warmth. This information is not intended to replace advice given to you by your health care provider. Make sure you discuss any questions you have with your health care provider. Document Released: 11/21/2006 Document Revised: 03/01/2017 Document  Reviewed: 03/01/2017 Elsevier Interactive Patient Education  2019 [place].  Blood Transfusion, Adult, Care After This sheet gives you information about how to care for yourself after your procedure. Your doctor may also give you more specific instructions. If you have problems or questions, contact your doctor. Follow these instructions at home:   Take over-the-counter and prescription medicines only as told by your doctor.  Go back to your normal activities as told by your doctor.  Follow instructions from your doctor about how to take care of the area where an IV tube was put into your vein (insertion site). Make sure you: ? Wash your hands with soap and water before you change your bandage (dressing). If there is no soap and water, use hand sanitizer. ? Change your bandage as told by your doctor.  Check your IV insertion site every day for signs of infection. Check for: ? More redness, swelling, or pain. ? More fluid or blood. ? Warmth. ? Pus or a bad smell. Contact a doctor if:  You have more redness, swelling, or pain around the IV insertion site.  You have more fluid or blood coming from the IV insertion site.  Your IV insertion site feels warm to the touch.  You have pus or  a bad smell coming from the IV insertion site.  Your pee (urine) turns pink, red, or brown.  You feel weak after doing your normal activities. Get help right away if:  You have signs of a serious allergic or body defense (immune) system reaction, including: ? Itchiness. ? Hives. ? Trouble breathing. ? Anxiety. ? Pain in your chest or lower back. ? Fever, flushing, and chills. ? Fast pulse. ? Rash. ? Watery poop (diarrhea). ? Throwing up (vomiting). ? Dark pee. ? Serious headache. ? Dizziness. ? Stiff neck. ? Yellow color in your face or the white parts of your eyes (jaundice). Summary  After a blood transfusion, return to your normal activities as told by your doctor.  Every  day, check for signs of infection where the IV tube was put into your vein.  Some signs of infection are warm skin, more redness and pain, more fluid or blood, and pus or a bad smell where the needle went in.  Contact your doctor if you feel weak or have any unusual symptoms. This information is not intended to replace advice given to you by your health care provider. Make sure you discuss any questions you have with your health care provider. Document Released: 02/14/2014 Document Revised: 09/18/2015 Document Reviewed: 09/18/2015 Elsevier Interactive Patient Education  [employer].

## 2018-08-04 LAB — PREPARE PLATELET PHERESIS: Unit division: 0

## 2018-08-04 LAB — BPAM PLATELET PHERESIS
Blood Product Expiration Date: 202006282359
ISSUE DATE / TIME: 202006260909
Unit Type and Rh: 6200

## 2018-08-06 ENCOUNTER — Ambulatory Visit: Payer: Medicare Other

## 2018-08-06 ENCOUNTER — Other Ambulatory Visit: Payer: Medicare Other

## 2018-08-20 ENCOUNTER — Other Ambulatory Visit: Payer: Self-pay | Admitting: *Deleted

## 2018-08-20 ENCOUNTER — Inpatient Hospital Stay (HOSPITAL_BASED_OUTPATIENT_CLINIC_OR_DEPARTMENT_OTHER): Payer: Medicare Other | Admitting: Hematology

## 2018-08-20 ENCOUNTER — Telehealth: Payer: Self-pay | Admitting: *Deleted

## 2018-08-20 ENCOUNTER — Inpatient Hospital Stay: Payer: Medicare Other

## 2018-08-20 ENCOUNTER — Inpatient Hospital Stay: Payer: Medicare Other | Attending: Hematology

## 2018-08-20 ENCOUNTER — Telehealth: Payer: Self-pay | Admitting: Hematology

## 2018-08-20 ENCOUNTER — Other Ambulatory Visit: Payer: Self-pay

## 2018-08-20 VITALS — BP 119/60 | HR 92 | Temp 98.7°F | Resp 18 | Ht 67.0 in | Wt 167.0 lb

## 2018-08-20 DIAGNOSIS — D469 Myelodysplastic syndrome, unspecified: Secondary | ICD-10-CM | POA: Diagnosis present

## 2018-08-20 DIAGNOSIS — D61818 Other pancytopenia: Secondary | ICD-10-CM | POA: Diagnosis not present

## 2018-08-20 DIAGNOSIS — D696 Thrombocytopenia, unspecified: Secondary | ICD-10-CM

## 2018-08-20 DIAGNOSIS — D649 Anemia, unspecified: Secondary | ICD-10-CM

## 2018-08-20 DIAGNOSIS — E039 Hypothyroidism, unspecified: Secondary | ICD-10-CM | POA: Insufficient documentation

## 2018-08-20 LAB — PREPARE RBC (CROSSMATCH)

## 2018-08-20 LAB — CBC WITH DIFFERENTIAL/PLATELET
Abs Immature Granulocytes: 0 10*3/uL (ref 0.00–0.07)
Basophils Absolute: 0 10*3/uL (ref 0.0–0.1)
Basophils Relative: 0 %
Eosinophils Absolute: 0 10*3/uL (ref 0.0–0.5)
Eosinophils Relative: 0 %
HCT: 25.6 % — ABNORMAL LOW (ref 39.0–52.0)
Hemoglobin: 7.9 g/dL — ABNORMAL LOW (ref 13.0–17.0)
Immature Granulocytes: 0 %
Lymphocytes Relative: 43 %
Lymphs Abs: 0.7 10*3/uL (ref 0.7–4.0)
MCH: 29.7 pg (ref 26.0–34.0)
MCHC: 30.9 g/dL (ref 30.0–36.0)
MCV: 96.2 fL (ref 80.0–100.0)
Monocytes Absolute: 0.1 10*3/uL (ref 0.1–1.0)
Monocytes Relative: 8 %
Neutro Abs: 0.8 10*3/uL — ABNORMAL LOW (ref 1.7–7.7)
Neutrophils Relative %: 49 %
Platelets: 7 10*3/uL — CL (ref 150–400)
RBC: 2.66 MIL/uL — ABNORMAL LOW (ref 4.22–5.81)
RDW: 23.9 % — ABNORMAL HIGH (ref 11.5–15.5)
WBC: 1.6 10*3/uL — ABNORMAL LOW (ref 4.0–10.5)
nRBC: 0 % (ref 0.0–0.2)

## 2018-08-20 LAB — CMP (CANCER CENTER ONLY)
ALT: 9 U/L (ref 0–44)
AST: 15 U/L (ref 15–41)
Albumin: 4 g/dL (ref 3.5–5.0)
Alkaline Phosphatase: 50 U/L (ref 38–126)
Anion gap: 5 (ref 5–15)
BUN: 28 mg/dL — ABNORMAL HIGH (ref 8–23)
CO2: 25 mmol/L (ref 22–32)
Calcium: 8.4 mg/dL — ABNORMAL LOW (ref 8.9–10.3)
Chloride: 109 mmol/L (ref 98–111)
Creatinine: 0.91 mg/dL (ref 0.61–1.24)
GFR, Est AFR Am: >60 mL/min (ref >60)
GFR, Estimated: >60 mL/min (ref >60)
Glucose, Bld: 92 mg/dL (ref 70–99)
Potassium: 4.3 mmol/L (ref 3.5–5.1)
Sodium: 139 mmol/L (ref 135–145)
Total Bilirubin: 0.7 mg/dL (ref 0.3–1.2)
Total Protein: 6.2 g/dL — ABNORMAL LOW (ref 6.5–8.1)

## 2018-08-20 LAB — SAMPLE TO BLOOD BANK

## 2018-08-20 MED ORDER — ACETAMINOPHEN 325 MG PO TABS
650.0000 mg | ORAL_TABLET | Freq: Once | ORAL | Status: AC
  Administered 2018-08-20: 650 mg via ORAL

## 2018-08-20 MED ORDER — DIPHENHYDRAMINE HCL 25 MG PO CAPS
25.0000 mg | ORAL_CAPSULE | Freq: Once | ORAL | Status: AC
  Administered 2018-08-20: 25 mg via ORAL

## 2018-08-20 MED ORDER — ACETAMINOPHEN 325 MG PO TABS
ORAL_TABLET | ORAL | Status: AC
  Filled 2018-08-20: qty 2

## 2018-08-20 MED ORDER — SODIUM CHLORIDE 0.9% IV SOLUTION
250.0000 mL | Freq: Once | INTRAVENOUS | Status: AC
  Administered 2018-08-20: 250 mL via INTRAVENOUS
  Filled 2018-08-20: qty 250

## 2018-08-20 MED ORDER — DIPHENHYDRAMINE HCL 25 MG PO CAPS
ORAL_CAPSULE | ORAL | Status: AC
  Filled 2018-08-20: qty 1

## 2018-08-20 NOTE — Patient Instructions (Signed)
Thrombocytopenia Thrombocytopenia means that you have a low number of platelets in your blood. Platelets are tiny cells in the blood. When you bleed, they clump together at the cut or injury to stop the bleeding. This is called blood clotting. If you do not have enough platelets, it can cause bleeding problems. Some cases of this condition are mild while others are more severe. What are the causes? This condition may be caused by:  Your body not making enough platelets. This may be caused by: ? Your bone marrow not making blood cells (aplastic anemia). ? Cancer in the bone marrow. ? Certain medicines. ? Infection in the bone marrow. ? Drinking a lot of alcohol.  Your body destroying platelets too quickly. This may be caused by: ? Certain immune diseases. ? Certain medicines. ? Certain blood clotting disorders. ? Certain disorders that are passed from parent to child (inherited). ? Certain bleeding disorders. ? Pregnancy. ? Having a spleen that is larger than normal. What are the signs or symptoms?  Bleeding that is not normal.  Nosebleeds.  Heavy menstrual periods.  Blood in the pee (urine) or poop (stool).  A purple-like color to the skin (purpura).  Bruising.  A rash that looks like pinpoint, purple-red spots (petechiae). How is this treated?  Treatment of another condition that is causing the low platelet count.  Medicines to help protect your platelets from being destroyed.  A replacement (transfusion) of platelets to stop or prevent bleeding.  Surgery to remove the spleen. Follow these instructions at home: Activity  Avoid activities that could cause you to get hurt or bruised. Follow instructions about how to prevent falls.  Take care not to cut yourself: ? When you shave. ? When you use scissors, needles, knives, or other tools.  Take care not to burn yourself: ? When you use an iron. ? When you cook. General instructions   Check your skin and the  inside of your mouth for bruises or blood as told by your doctor.  Check to see if there is blood in your spit (sputum), pee, and poop. Do this as told by your doctor.  Do not drink alcohol.  Take over-the-counter and prescription medicines only as told by your doctor.  Do not take any medicines that have aspirin or NSAIDs in them. These medicines can thin your blood and cause you to bleed.  Tell all of your doctors that you have this condition. Be sure to tell your dentist and eye doctor too. Contact a doctor if:  You have bruises and you do not know why. Get help right away if:  You are bleeding anywhere on your body.  You have blood in your spit, pee, or poop. Summary  Thrombocytopenia means that you have a low number of platelets in your blood.  Platelets are needed for blood clotting.  Symptoms of this condition include bleeding that is not normal, and bruising.  Take care not to cut or burn yourself. This information is not intended to replace advice given to you by your health care provider. Make sure you discuss any questions you have with your health care provider. Document Released: 01/13/2011 Document Revised: 10/26/2017 Document Reviewed: 10/26/2017 Elsevier Patient Education  2020 [employer].   Blood Transfusion, Adult A blood transfusion is a procedure in which you are given blood through an IV tube. You may need this procedure because of:  Illness.  Surgery.  Injury. The blood may come from someone else (a donor). You may also  be able to donate blood for yourself (autologous blood donation). The blood given in a transfusion is made up of different types of cells. You may get:  Red blood cells. These carry oxygen to the cells in the body.  White blood cells. These help you fight infections.  Platelets. These help your blood to clot.  Plasma. This is the liquid part of your blood. It helps with fluid imbalances. If you have a clotting disorder, you  may also get other types of blood products. What happens before the procedure?  You will have a blood test to find out your blood type. The test also finds out what type of blood your body will accept and matches it to the donor type.  If you are going to have a planned surgery, you may be able to donate your own blood. This may be done in case you need a transfusion.  If you have had an allergic reaction to a transfusion in the past, you may be given medicine to help prevent a reaction. This medicine may be given to you by mouth or through an IV.  You will have your temperature, blood pressure, and pulse checked.  Follow instructions from your doctor about what you cannot eat or drink.  Ask your doctor about: ? Changing or stopping your regular medicines. This is important if you take diabetes medicines or blood thinners. ? Taking medicines such as aspirin and ibuprofen. These medicines can thin your blood. Do not take these medicines before your procedure if your doctor tells you not to. What happens during the procedure?  An IV tube will be put into one of your veins.  The bag of donated blood will be attached to your IV tube. Then, the blood will enter through your vein.  Your temperature, blood pressure, and pulse will be checked regularly during the procedure. This is done to find early signs of a transfusion reaction.  If you have any signs or symptoms of a reaction, your transfusion will be stopped. You may also be given medicine.  When the transfusion is done, your IV tube will be taken out.  Pressure may be applied to the IV site for a few minutes.  A bandage (dressing) will be put on the IV site. The procedure may vary among doctors and hospitals. What happens after the procedure?  Your temperature, blood pressure, heart rate, breathing rate, and blood oxygen level will be checked often.  Your blood may be tested to see how you are responding to the transfusion.  You  may be warmed with fluids or blankets. This is done to keep the temperature of your body normal. Summary  A blood transfusion is a procedure in which you are given blood through an IV tube.  The blood may come from someone else (a donor). You may also be able to donate blood for yourself.  If you have had an allergic reaction to a transfusion in the past, you may be given medicine to help prevent a reaction. This medicine may be given to you by mouth or through an IV tube.  Your temperature, blood pressure, heart rate, breathing rate, and blood oxygen level will be checked often.  Your blood may be tested to see how you are responding to the transfusion. This information is not intended to replace advice given to you by your health care provider. Make sure you discuss any questions you have with your health care provider. Document Released: 04/22/2008 Document Revised: 03/16/2016 Document  Reviewed: 09/18/2015 Elsevier Patient Education  2020 [place] (COVID-19) Are you at risk?  Are you at risk for the Coronavirus (COVID-19)?  To be considered HIGH RISK for Coronavirus (COVID-19), you have to meet the following criteria:  . Traveled to [place], [place], [place], [place] or [place]; or in the [place] to [place], [place], [place], or [place]; and have fever, cough, and shortness of breath within the last 2 weeks of travel OR . Been in close contact with a person diagnosed with COVID-19 within the last 2 weeks and have fever, cough, and shortness of breath . IF YOU DO NOT MEET THESE CRITERIA, YOU ARE CONSIDERED LOW RISK FOR COVID-19.  What to do if you are HIGH RISK for COVID-19?  Marland Kitchen If you are having a medical emergency, call 911. . Seek medical care right away. Before you go to a doctor's office, urgent care or emergency department, call ahead and tell them about your recent travel, contact with someone diagnosed with COVID-19, and your symptoms. You should  receive instructions from your physician's office regarding next steps of care.  . When you arrive at healthcare provider, tell the healthcare staff immediately you have returned from visiting [place], [place], [place], [place] or [place]; or traveled in the [place] to [place], [place], [place], or [place]; in the last two weeks or you have been in close contact with a person diagnosed with COVID-19 in the last 2 weeks.   . Tell the health care staff about your symptoms: fever, cough and shortness of breath. . After you have been seen by a medical provider, you will be either: o Tested for (COVID-19) and discharged home on quarantine except to seek medical care if symptoms worsen, and asked to  - Stay home and avoid contact with others until you get your results (4-5 days)  - Avoid travel on public transportation if possible (such as bus, train, or airplane) or o Sent to the Emergency Department by EMS for evaluation, COVID-19 testing, and possible admission depending on your condition and test results.  What to do if you are LOW RISK for COVID-19?  Reduce your risk of any infection by using the same precautions used for avoiding the common cold or flu:  Marland Kitchen Wash your hands often with soap and warm water for at least 20 seconds.  If soap and water are not readily available, use an alcohol-based hand sanitizer with at least 60% alcohol.  . If coughing or sneezing, cover your mouth and nose by coughing or sneezing into the elbow areas of your shirt or coat, into a tissue or into your sleeve (not your hands). . Avoid shaking hands with others and consider head nods or verbal greetings only. . Avoid touching your eyes, nose, or mouth with unwashed hands.  . Avoid close contact with people who are sick. . Avoid places or events with large numbers of people in one location, like concerts or sporting events. . Carefully consider travel plans you have or are making. . If you are planning any  travel outside or inside the [place], visit the CDC's Travelers' Health webpage for the latest health notices. . If you have some symptoms but not all symptoms, continue to monitor at home and seek medical attention if your symptoms worsen. . If you are having a medical emergency, call 911.   ADDITIONAL HEALTHCARE OPTIONS FOR PATIENTS  [facility] / e-Visit: [email or web address]  [facility]: [facility]: [phone]                   [facility]: [phone]

## 2018-08-20 NOTE — Progress Notes (Signed)
HEMATOLOGY/ONCOLOGY CLINIC NOTE  Date of Service: 08/20/2018  Patient Care Team: Patient, No Pcp Per as PCP - General (General Practice)  CHIEF COMPLAINTS/PURPOSE OF CONSULTATION:  Myelodysplastic Syndrome  Oncologic History:   Shaun Hobbs initially presented to care with pancytopenia and was evaluated with a BM Bx on 05/16/06 which did not reveal evidence of abnormal myeloid maturation or other abnormalities, and was without chromosomal abnormality as well. A BM Bx was repeated on 08/28/07 which did reveal dyserythropoiesis and some dysplasia. A 04/17/12 Bm biopsy revealed mild dysplasia, hypercellular marrow with 1.5% blasts, and a minute CD5 positive monoclonal B-cell population detected by flow. 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 His most recent 12/27/17 Bm Bx revealed 30% cellularity with trilineage hematopoiesis, mild megakaryocytic hyperplasia with dyspoietic changes, mild erythroid hyperplasia with ringed sideroblasts, and a minute population of monoclonal B-cells detected by flow.   The pt started 24m Revlimid for 3 months, beginning 09/03/10 "without noticeable improvement." He later began Vidaza, and completed his 21st cycle the week of November 30, 2017.  HISTORY OF PRESENTING ILLNESS:   Shaun BREITHAUPTis a wonderful 85y.o. male who [facility] [facility] Myelod[place] Syndrome. He is accompanied today by his wife and daughter. The pt reports that he is doing well overall.  The pt's wife reports that the first thing that occurred related to the patient's MDS was that his PLT were seen to be reduced to 99k, 12 years ago. The pt was evaluated with repeat BM biopsies over the last 12 years, and began Revlimid  for only 3 months in 2012. The pt's wife notes that he began Vidaza a little less than two years ago, after his PLT dropped to about 60k, and his last dose of Vidaza was in late October 2019. The pt has had one blood transfusion ever, which was in November 2019. The pt's wife notes that during this time of not taking Vidaza, she has not noticed a change in her husband's energy levels or day to day activities. The pt denies dizziness, light headedness, fatigue, abnormal bruising, nose bleeds, gum bleeds, or other concerns for bleeding. The pt and wife deny recent or frequent infections.  The pt's wife notes that Vidaza was stopped to "take a break," trend his counts, and [place] His last BM Bx was November 2019.  The pt and his wife note that there have not been other recent medical concerns. She notes that the pt's last A1C was 4.9.  The pt's wife notes that he is current with his annual flu vaccine, both every 5-year Prevnar and Pneumovax, and shingles vaccine.  The pt's wife notes that the pt's memory is "slowly decreasing," and he no longer drives. The pt's [facility] loss," and has taken Aricept for 20 years.  Most recent lab results (02/19/18) of CBC w/diff and CMP is as follows: all values are WNL except for WBC at 1.5k, RBC at 2.42, HGB at 7.9, HCT at 23.3, MCH at 32.2, PLT at 28k, RDW at 18.8, ANC at 700, Lymphocytes at 600, Monocytes at 200, Glucose at 111, BUN at 28.  On review of systems, pt  reports stable energy levels, eating well, stable weight, good appetite, and denies fevers, fatigue, light headedness, dizziness, nose bleeds, gum bleeds, abnormal bruising, other concerns for bleeding, falls, recent infections, frequent infections, bone pains, problems passing urine, abdominal pains, lower abdominal pains, and any other symptoms.  On PMHx the pt reports MDS, HLD, Early onset Alzheimer's  dementia, Hypothyroidism, hypertrophy or prostate with urinary obstruction.   Interval History:   Shaun Hobbs returns today for management and evaluation of his MDS.He is here with his wife. The patient's last visit with [place] was on 06/25/2018. The pt reports that he is doing well overall.  The pt reports that he has been feeling well. He has been fairly active. There will occasionally be some blood on his pillow after sleeping, but he has had no nose or gum bleeding. He has been   Lab results today (08/20/18) of CBC w/diff and CMP is as follows: all values are WNL except for WBC at 1.6, RBC at 2.66, hemoglobin at 7.9, HCT at 25.6, RDW at 23.9, platelets at 7, neutro abs at 0.8.  On review of systems, pt reports good energy and denies bleeding and any other symptoms.     MEDICAL HISTORY:  MDS, HLD, Early onset Alzheimer's dementia, Hypothyroidism, hypertrophy or prostate with urinary obstruction.   SURGICAL HISTORY: BM Bx   SOCIAL HISTORY: Social History   Socioeconomic History  . Marital status: Married    Spouse name: Not on file  . Number of children: Not on file  . Years of education: Not on file  . Highest education level: Not on file  Occupational History  . Not on file  Social Needs  . Financial resource strain: Not on file  . Food insecurity    Worry: Not on file    Inability: Not on file  . Transportation needs    Medical: Not on file    Non-medical: Not on file  Tobacco Use  . Smoking status: Not on file  Substance and Sexual Activity  . Alcohol use: Not on file  . Drug use: Not on file  . Sexual activity: Not on file  Lifestyle  . Physical activity    Days per week: Not on file    Minutes per session: Not on file  . Stress: Not on file  Relationships  . Social Herbalist on phone: Not on file    Gets together: Not on file    Attends religious service: Not on file    Active member of club or organization: Not on file    Attends meetings  of clubs or organizations: Not on file    Relationship status: Not on file  . Intimate partner violence    Fear of current or ex partner: Not on file    Emotionally abused: Not on file    Physically abused: Not on file    Forced sexual activity: Not on file  Other Topics Concern  . Not on file  Social History Narrative  . Not on file    FAMILY HISTORY: No family history on file.  ALLERGIES:  is allergic to sulfa antibiotics.  MEDICATIONS:  Current Outpatient Medications  Medication Sig Dispense Refill  . Atorvastatin Calcium (LIPITOR PO) Take 5 mg by mouth at bedtime.    . Cholecalciferol (VITAMIN D3 PO) Take by mouth daily.    Marland Kitchen donepezil (ARICEPT) 10 MG tablet Take 10 mg by mouth at bedtime.    Marland Kitchen levothyroxine (SYNTHROID, LEVOTHROID) 100  MCG tablet Take 100 mcg by mouth daily before breakfast.    . MAGNESIUM PO Take by mouth.    . sertraline (ZOLOFT) 100 MG tablet Take 100 mg by mouth at bedtime.    . tamsulosin (FLOMAX) 0.4 MG CAPS capsule Take 0.4 mg by mouth at bedtime.     No current facility-administered medications for this visit.     REVIEW OF SYSTEMS:   A 10+ POINT REVIEW OF SYSTEMS WAS OBTAINED including neurology, dermatology, psychiatry, cardiac, respiratory, lymph, extremities, GI, GU, Musculoskeletal, constitutional, breasts, reproductive, HEENT.  All pertinent positives are noted in the HPI.  All others are negative.     PHYSICAL EXAMINATION: ECOG PERFORMANCE STATUS: 2-3   . Vitals:   08/20/18 1242  BP: 119/60  Pulse: 92  Resp: 18  Temp: 98.7 F (37.1 C)  SpO2: 100%   Filed Weights   08/20/18 1242  Weight: 167 lb (75.8 kg)   .Body mass index is 26.16 kg/m.  GENERAL:alert, in no acute distress and comfortable SKIN: no acute rashes, no significant lesions EYES: conjunctiva are pink and non-injected, sclera anicteric OROPHARYNX: MMM, no exudates, no oropharyngeal erythema or ulceration NECK: supple, no JVD LYMPH:  no palpable lymphadenopathy  in the cervical, axillary or inguinal regions LUNGS: clear to auscultation b/l with normal respiratory effort HEART: regular rate & rhythm ABDOMEN:  normoactive bowel sounds , non tender, not distended. Extremity: no pedal edema PSYCH: alert & oriented x 3 with fluent speech NEURO: no focal motor/sensory deficits   LABORATORY DATA:  I have reviewed the data as listed  . CBC Latest Ref Rng & Units 08/20/2018 08/03/2018 07/23/2018  WBC 4.0 - 10.5 K/uL 1.6(L) 1.3(L) 1.4(L)  Hemoglobin 13.0 - 17.0 g/dL 7.9(L) 8.8(L) 7.6(L)  Hematocrit 39.0 - 52.0 % 25.6(L) 28.7(L) 25.0(L)  Platelets 150 - 400 K/uL 7(LL) 5(LL) 8(LL)   ANC 500 . CMP Latest Ref Rng & Units 08/20/2018 07/23/2018 06/25/2018  Glucose 70 - 99 mg/dL 92 85 130(H)  BUN 8 - 23 mg/dL 28(H) 24(H) 22  Creatinine 0.61 - 1.24 mg/dL 0.91 0.89 0.97  Sodium 135 - 145 mmol/L 139 140 138  Potassium 3.5 - 5.1 mmol/L 4.3 4.4 4.1  Chloride 98 - 111 mmol/L 109 109 106  CO2 22 - 32 mmol/L '25 23 25  ' Calcium 8.9 - 10.3 mg/dL 8.4(L) 8.5(L) 8.5(L)  Total Protein 6.5 - 8.1 g/dL 6.2(L) 6.1(L) 6.0(L)  Total Bilirubin 0.3 - 1.2 mg/dL 0.7 0.7 0.7  Alkaline Phos 38 - 126 U/L 50 52 48  AST 15 - 41 U/L '15 17 17  ' ALT 0 - 44 U/L '9 11 10    ' CBC w/diff and CMP:    11/19/15 BM Bx:   11/19/15 Cytogenetics:   12/27/17 Bm Bx:    RADIOGRAPHIC STUDIES: I have personally reviewed the radiological images as listed and agreed with the findings in the report. No results found.  ASSESSMENT & PLAN:  85 y.o. male with:  1. 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 No evidence for a lymphoproliferative disorder. Normal cytogenetics. 08/28/07 BM Bx revealed mild dyserythropoiesis and a suggestion of some dysplasia within the myeloid series.  04/17/12 BM Bx revealed hypercellular marrow with 50% cellularity, 1.5% blasts, with trilineage hematopoiesis, marrow monocytosis, mild myeloid and megakaryocytic dysplasia, adequate storage and iron utilization, no  ring sideroblasts. Small monoclonal B lymphocytic population of uncertain significance. 11/19/15 BM Bx revealed Hypercellular marrow for age about 70% with panmyelosis including increased megakaryocytes with atypia, moderate marrow monocytosis about 15-20% and <5% blasts. Minute CD5 positive monoclonal B-cell population. Storage iron present. 12/27/17 BM Bx revealed normocellular marrow for age with 30% cellularity and trilineage hematopoiesis, mild megakaryocytic hyperplasia with dyspoietic changes, mild erythroid hyperplasia with ringed sideroblasts, and minute population of monoclonal B cells detected by flow.  S/p 21 cycles of Vidaza completed in the week of November 30, 2017. Previous oncologic notes suggest that the patient's pancytopenia was continuing to worsen through [place] treatment.  Labs upon initial presentation from 02/19/18, WBC at 1.5k, HGB at 7.9, PLT at 28k, ANC at 700   PLAN: -Discussed pt labwork today, 08/20/18; all values are WNL except for WBC at 1.6, RBC at 2.66, hemoglobin at 7.9, HCT at 25.6, RDW at 23.9, platelets at 7, neutro abs at 0.8. -Discussed transfusion schedule for platelets every 2 weeks and red blood every 1 month   -Discussed schedule changes to accommodate distance and timing preferences -See back in 8 weeks with CBC and CMP   FOLLOW UP: Plz schedule for labs and 2 units of PRBC every 2 weeks - plz schedule x 6 RTC with Dr Irene Limbo in 8 weeks. (Patient/wife prefer earliest appointment in the morning that is possible)   All of the patients questions were answered with apparent satisfaction. The patient knows to call the clinic with any problems, questions or concerns.  The total time spent in the appt was 20 minutes and more than 50% was on  counseling and direct patient cares.     Sullivan Lone MD MS AAHIVMS [facility] Hematology/Oncology Physician [facility]  (Office):       [phone] (Work cell):  [phone] (Fax):           [phone]  08/20/2018 1:12 PM  I, Jacqualyn Posey, am acting as a Education administrator for Dr. Sullivan Lone.   .I have reviewed the above documentation for accuracy and completeness, and I agree with the above. Brunetta Genera MD

## 2018-08-20 NOTE — Telephone Encounter (Signed)
Received call report from [facility].  "Today's Pltc = 7; Hgb = 7.9.  Hgb not critical but we are sending specimen to blood bank now."  Connected with collaborative with results.  Scheduled provider F/U today.

## 2018-08-20 NOTE — Telephone Encounter (Signed)
Scheduled appt per 7/13 los.  Called infusion and infusion nurse will give patient his appt calendar

## 2018-08-21 LAB — PREPARE PLATELET PHERESIS: Unit division: 0

## 2018-08-21 LAB — BPAM RBC
Blood Product Expiration Date: 202008072359
ISSUE DATE / TIME: 202007131408
Unit Type and Rh: 6200

## 2018-08-21 LAB — TYPE AND SCREEN
ABO/RH(D): A POS
Antibody Screen: NEGATIVE
Unit division: 0

## 2018-08-21 LAB — BPAM PLATELET PHERESIS
Blood Product Expiration Date: 202007162359
ISSUE DATE / TIME: 202007131630
Unit Type and Rh: 6200

## 2018-09-03 ENCOUNTER — Inpatient Hospital Stay: Payer: Medicare Other

## 2018-09-03 ENCOUNTER — Other Ambulatory Visit: Payer: Self-pay

## 2018-09-03 ENCOUNTER — Telehealth: Payer: Self-pay | Admitting: *Deleted

## 2018-09-03 DIAGNOSIS — D469 Myelodysplastic syndrome, unspecified: Secondary | ICD-10-CM

## 2018-09-03 DIAGNOSIS — D649 Anemia, unspecified: Secondary | ICD-10-CM

## 2018-09-03 DIAGNOSIS — D696 Thrombocytopenia, unspecified: Secondary | ICD-10-CM

## 2018-09-03 LAB — CBC WITH DIFFERENTIAL/PLATELET
Abs Immature Granulocytes: 0 10*3/uL (ref 0.00–0.07)
Basophils Absolute: 0 10*3/uL (ref 0.0–0.1)
Basophils Relative: 0 %
Eosinophils Absolute: 0 10*3/uL (ref 0.0–0.5)
Eosinophils Relative: 0 %
HCT: 28.5 % — ABNORMAL LOW (ref 39.0–52.0)
Hemoglobin: 8.7 g/dL — ABNORMAL LOW (ref 13.0–17.0)
Immature Granulocytes: 0 %
Lymphocytes Relative: 44 %
Lymphs Abs: 0.5 10*3/uL — ABNORMAL LOW (ref 0.7–4.0)
MCH: 29.5 pg (ref 26.0–34.0)
MCHC: 30.5 g/dL (ref 30.0–36.0)
MCV: 96.6 fL (ref 80.0–100.0)
Monocytes Absolute: 0.1 10*3/uL (ref 0.1–1.0)
Monocytes Relative: 8 %
Neutro Abs: 0.5 10*3/uL — ABNORMAL LOW (ref 1.7–7.7)
Neutrophils Relative %: 48 %
Platelets: 5 10*3/uL — CL (ref 150–400)
RBC: 2.95 MIL/uL — ABNORMAL LOW (ref 4.22–5.81)
RDW: 23.1 % — ABNORMAL HIGH (ref 11.5–15.5)
WBC: 1.1 10*3/uL — ABNORMAL LOW (ref 4.0–10.5)
nRBC: 0 % (ref 0.0–0.2)

## 2018-09-03 LAB — SAMPLE TO BLOOD BANK

## 2018-09-03 MED ORDER — ACETAMINOPHEN 325 MG PO TABS
ORAL_TABLET | ORAL | Status: AC
  Filled 2018-09-03: qty 2

## 2018-09-03 MED ORDER — DIPHENHYDRAMINE HCL 25 MG PO CAPS
ORAL_CAPSULE | ORAL | Status: AC
  Filled 2018-09-03: qty 1

## 2018-09-03 MED ORDER — ACETAMINOPHEN 325 MG PO TABS
650.0000 mg | ORAL_TABLET | Freq: Once | ORAL | Status: AC
  Administered 2018-09-03: 650 mg via ORAL

## 2018-09-03 MED ORDER — DIPHENHYDRAMINE HCL 25 MG PO CAPS
25.0000 mg | ORAL_CAPSULE | Freq: Once | ORAL | Status: AC
  Administered 2018-09-03: 25 mg via ORAL

## 2018-09-03 MED ORDER — SODIUM CHLORIDE 0.9% IV SOLUTION
250.0000 mL | Freq: Once | INTRAVENOUS | Status: AC
  Administered 2018-09-03: 10:00:00 250 mL via INTRAVENOUS
  Filled 2018-09-03: qty 250

## 2018-09-03 NOTE — Patient Instructions (Signed)

## 2018-09-03 NOTE — Telephone Encounter (Signed)
CRITICAL VALUE STICKER  CRITICAL VALUE: "Pltc = 5 K/L.  FYI not critical Hgb = 8.7 g/dL blood bank hold sent."   RECEIVER (on-site recipient of call): Cherylynn Ridges RN, Triage [place] NOTIFIED: 09/03/2018 9:37 am.   MESSENGER (representative from lab): Reva MT Myles Gip  MD NOTIFIED: Dr. Irene Limbo.  TIME OF NOTIFICATION: 09/03/2018, 9:44 am collaborative nurse provided result note.  RESPONSE: None at this time.

## 2018-09-04 LAB — PREPARE PLATELET PHERESIS: Unit division: 0

## 2018-09-04 LAB — BPAM PLATELET PHERESIS
Blood Product Expiration Date: 202007292359
ISSUE DATE / TIME: 202007271201
Unit Type and Rh: 6200

## 2018-09-17 ENCOUNTER — Other Ambulatory Visit: Payer: Self-pay | Admitting: *Deleted

## 2018-09-17 ENCOUNTER — Telehealth: Payer: Self-pay | Admitting: *Deleted

## 2018-09-17 ENCOUNTER — Inpatient Hospital Stay: Payer: Medicare Other

## 2018-09-17 ENCOUNTER — Inpatient Hospital Stay: Payer: Medicare Other | Attending: Hematology

## 2018-09-17 ENCOUNTER — Other Ambulatory Visit: Payer: Self-pay

## 2018-09-17 DIAGNOSIS — D696 Thrombocytopenia, unspecified: Secondary | ICD-10-CM

## 2018-09-17 DIAGNOSIS — D649 Anemia, unspecified: Secondary | ICD-10-CM

## 2018-09-17 DIAGNOSIS — D469 Myelodysplastic syndrome, unspecified: Secondary | ICD-10-CM | POA: Insufficient documentation

## 2018-09-17 DIAGNOSIS — D61818 Other pancytopenia: Secondary | ICD-10-CM

## 2018-09-17 LAB — CMP (CANCER CENTER ONLY)
ALT: 9 U/L (ref 0–44)
AST: 17 U/L (ref 15–41)
Albumin: 4 g/dL (ref 3.5–5.0)
Alkaline Phosphatase: 49 U/L (ref 38–126)
Anion gap: 10 (ref 5–15)
BUN: 22 mg/dL (ref 8–23)
CO2: 22 mmol/L (ref 22–32)
Calcium: 8.6 mg/dL — ABNORMAL LOW (ref 8.9–10.3)
Chloride: 108 mmol/L (ref 98–111)
Creatinine: 0.99 mg/dL (ref 0.61–1.24)
GFR, Est AFR Am: >60 mL/min (ref >60)
GFR, Estimated: >60 mL/min (ref >60)
Glucose, Bld: 104 mg/dL — ABNORMAL HIGH (ref 70–99)
Potassium: 4.1 mmol/L (ref 3.5–5.1)
Sodium: 140 mmol/L (ref 135–145)
Total Bilirubin: 0.5 mg/dL (ref 0.3–1.2)
Total Protein: 6.1 g/dL — ABNORMAL LOW (ref 6.5–8.1)

## 2018-09-17 LAB — CBC WITH DIFFERENTIAL/PLATELET
Abs Immature Granulocytes: 0.01 10*3/uL (ref 0.00–0.07)
Basophils Absolute: 0 10*3/uL (ref 0.0–0.1)
Basophils Relative: 0 %
Eosinophils Absolute: 0 10*3/uL (ref 0.0–0.5)
Eosinophils Relative: 1 %
HCT: 26.1 % — ABNORMAL LOW (ref 39.0–52.0)
Hemoglobin: 8 g/dL — ABNORMAL LOW (ref 13.0–17.0)
Immature Granulocytes: 1 %
Lymphocytes Relative: 42 %
Lymphs Abs: 0.5 10*3/uL — ABNORMAL LOW (ref 0.7–4.0)
MCH: 29.5 pg (ref 26.0–34.0)
MCHC: 30.7 g/dL (ref 30.0–36.0)
MCV: 96.3 fL (ref 80.0–100.0)
Monocytes Absolute: 0.1 10*3/uL (ref 0.1–1.0)
Monocytes Relative: 8 %
Neutro Abs: 0.6 10*3/uL — ABNORMAL LOW (ref 1.7–7.7)
Neutrophils Relative %: 48 %
Platelets: 5 10*3/uL — CL (ref 150–400)
RBC: 2.71 MIL/uL — ABNORMAL LOW (ref 4.22–5.81)
RDW: 23.3 % — ABNORMAL HIGH (ref 11.5–15.5)
WBC: 1.2 10*3/uL — ABNORMAL LOW (ref 4.0–10.5)
nRBC: 0 % (ref 0.0–0.2)

## 2018-09-17 LAB — SAMPLE TO BLOOD BANK

## 2018-09-17 LAB — PREPARE RBC (CROSSMATCH)

## 2018-09-17 MED ORDER — METHYLPREDNISOLONE SODIUM SUCC 125 MG IJ SOLR
INTRAMUSCULAR | Status: AC
  Filled 2018-09-17: qty 2

## 2018-09-17 MED ORDER — ACETAMINOPHEN 325 MG PO TABS
650.0000 mg | ORAL_TABLET | Freq: Once | ORAL | Status: AC
  Administered 2018-09-17: 650 mg via ORAL

## 2018-09-17 MED ORDER — ACETAMINOPHEN 325 MG PO TABS
ORAL_TABLET | ORAL | Status: AC
  Filled 2018-09-17: qty 2

## 2018-09-17 MED ORDER — SODIUM CHLORIDE 0.9% IV SOLUTION
250.0000 mL | Freq: Once | INTRAVENOUS | Status: AC
  Administered 2018-09-17: 250 mL via INTRAVENOUS
  Filled 2018-09-17: qty 250

## 2018-09-17 MED ORDER — METHYLPREDNISOLONE SODIUM SUCC 125 MG IJ SOLR
60.0000 mg | Freq: Once | INTRAMUSCULAR | Status: AC
  Administered 2018-09-17: 60 mg via INTRAVENOUS

## 2018-09-17 NOTE — Patient Instructions (Signed)
Blood Transfusion, Adult, Care After This sheet gives you information about how to care for yourself after your procedure. Your health care provider may also give you more specific instructions. If you have problems or questions, contact your health care provider. What can I expect after the procedure? After your procedure, it is common to have:  Bruising and soreness where the IV tube was inserted.  Headache. Follow these instructions at home:   Take over-the-counter and prescription medicines only as told by your health care provider.  Return to your normal activities as told by your health care provider.  Follow instructions from your health care provider about how to take care of your IV insertion site. Make sure you: ? Wash your hands with soap and water before you change your bandage (dressing). If soap and water are not available, use hand sanitizer. ? Change your dressing as told by your health care provider.  Check your IV insertion site every day for signs of infection. Check for: ? More redness, swelling, or pain. ? More fluid or blood. ? Warmth. ? Pus or a bad smell. Contact a health care provider if:  You have more redness, swelling, or pain around the IV insertion site.  You have more fluid or blood coming from the IV insertion site.  Your IV insertion site feels warm to the touch.  You have pus or a bad smell coming from the IV insertion site.  Your urine turns pink, red, or brown.  You feel weak after doing your normal activities. Get help right away if:  You have signs of a serious allergic or immune system reaction, including: ? Itchiness. ? Hives. ? Trouble breathing. ? Anxiety. ? Chest or lower back pain. ? Fever, flushing, and chills. ? Rapid pulse. ? Rash. ? Diarrhea. ? Vomiting. ? Dark urine. ? Serious headache. ? Dizziness. ? Stiff neck. ? Yellow coloration of the face or the white parts of the eyes (jaundice). This information is not  intended to replace advice given to you by your health care provider. Make sure you discuss any questions you have with your health care provider. Document Released: 02/14/2014 Document Revised: 11/21/2016 Document Reviewed: 08/10/2015 Elsevier Patient Education  2020 [employer]  

## 2018-09-17 NOTE — Telephone Encounter (Signed)
CRITICAL VALUE STICKER  CRITICAL VALUE: Platelets 5 and hgb 8.0  DATE & TIME NOTIFIED: 09/17/2018, 4585  MESSENGER (representative from lab):Tomi Bamberger  MD NOTIFIED: Dr. Irene Limbo  TIME OF NOTIFICATION:0858  RESPONSE: Orders received to give 1 unit PRBC and 1 unit platelets

## 2018-09-18 LAB — TYPE AND SCREEN
ABO/RH(D): A POS
Antibody Screen: NEGATIVE
Unit division: 0

## 2018-09-18 LAB — PREPARE PLATELET PHERESIS: Unit division: 0

## 2018-09-18 LAB — BPAM PLATELET PHERESIS
Blood Product Expiration Date: 202008112359
ISSUE DATE / TIME: 202008101047
Unit Type and Rh: 6200

## 2018-09-18 LAB — BPAM RBC
Blood Product Expiration Date: 202008292359
ISSUE DATE / TIME: 202008101041
Unit Type and Rh: 6200

## 2018-10-01 ENCOUNTER — Telehealth: Payer: Self-pay | Admitting: *Deleted

## 2018-10-01 ENCOUNTER — Inpatient Hospital Stay: Payer: Medicare Other

## 2018-10-01 ENCOUNTER — Other Ambulatory Visit: Payer: Self-pay

## 2018-10-01 ENCOUNTER — Other Ambulatory Visit: Payer: Self-pay | Admitting: *Deleted

## 2018-10-01 DIAGNOSIS — D469 Myelodysplastic syndrome, unspecified: Secondary | ICD-10-CM

## 2018-10-01 DIAGNOSIS — D649 Anemia, unspecified: Secondary | ICD-10-CM

## 2018-10-01 DIAGNOSIS — D61818 Other pancytopenia: Secondary | ICD-10-CM

## 2018-10-01 LAB — CMP (CANCER CENTER ONLY)
ALT: 10 U/L (ref 0–44)
AST: 17 U/L (ref 15–41)
Albumin: 3.9 g/dL (ref 3.5–5.0)
Alkaline Phosphatase: 54 U/L (ref 38–126)
Anion gap: 7 (ref 5–15)
BUN: 21 mg/dL (ref 8–23)
CO2: 24 mmol/L (ref 22–32)
Calcium: 8.3 mg/dL — ABNORMAL LOW (ref 8.9–10.3)
Chloride: 108 mmol/L (ref 98–111)
Creatinine: 0.94 mg/dL (ref 0.61–1.24)
GFR, Est AFR Am: >60 mL/min (ref >60)
GFR, Estimated: >60 mL/min (ref >60)
Glucose, Bld: 106 mg/dL — ABNORMAL HIGH (ref 70–99)
Potassium: 4.3 mmol/L (ref 3.5–5.1)
Sodium: 139 mmol/L (ref 135–145)
Total Bilirubin: 0.5 mg/dL (ref 0.3–1.2)
Total Protein: 6 g/dL — ABNORMAL LOW (ref 6.5–8.1)

## 2018-10-01 LAB — CBC WITH DIFFERENTIAL/PLATELET
Abs Immature Granulocytes: 0.03 10*3/uL (ref 0.00–0.07)
Basophils Absolute: 0 10*3/uL (ref 0.0–0.1)
Basophils Relative: 0 %
Eosinophils Absolute: 0 10*3/uL (ref 0.0–0.5)
Eosinophils Relative: 0 %
HCT: 26.8 % — ABNORMAL LOW (ref 39.0–52.0)
Hemoglobin: 8.3 g/dL — ABNORMAL LOW (ref 13.0–17.0)
Immature Granulocytes: 2 %
Lymphocytes Relative: 39 %
Lymphs Abs: 0.5 10*3/uL — ABNORMAL LOW (ref 0.7–4.0)
MCH: 29.6 pg (ref 26.0–34.0)
MCHC: 31 g/dL (ref 30.0–36.0)
MCV: 95.7 fL (ref 80.0–100.0)
Monocytes Absolute: 0.1 10*3/uL (ref 0.1–1.0)
Monocytes Relative: 8 %
Neutro Abs: 0.6 10*3/uL — ABNORMAL LOW (ref 1.7–7.7)
Neutrophils Relative %: 51 %
Platelets: 5 10*3/uL — CL (ref 150–400)
RBC: 2.8 MIL/uL — ABNORMAL LOW (ref 4.22–5.81)
RDW: 22.3 % — ABNORMAL HIGH (ref 11.5–15.5)
WBC: 1.3 10*3/uL — ABNORMAL LOW (ref 4.0–10.5)
nRBC: 0 % (ref 0.0–0.2)

## 2018-10-01 LAB — SAMPLE TO BLOOD BANK

## 2018-10-01 LAB — TYPE AND SCREEN
ABO/RH(D): A POS
Antibody Screen: NEGATIVE

## 2018-10-01 MED ORDER — ACETAMINOPHEN 325 MG PO TABS
650.0000 mg | ORAL_TABLET | Freq: Once | ORAL | Status: AC
  Administered 2018-10-01: 650 mg via ORAL

## 2018-10-01 MED ORDER — DIPHENHYDRAMINE HCL 25 MG PO CAPS
ORAL_CAPSULE | ORAL | Status: AC
  Filled 2018-10-01: qty 1

## 2018-10-01 MED ORDER — ACETAMINOPHEN 325 MG PO TABS
ORAL_TABLET | ORAL | Status: AC
  Filled 2018-10-01: qty 2

## 2018-10-01 MED ORDER — DIPHENHYDRAMINE HCL 25 MG PO CAPS
25.0000 mg | ORAL_CAPSULE | Freq: Once | ORAL | Status: AC
  Administered 2018-10-01: 25 mg via ORAL

## 2018-10-01 MED ORDER — SODIUM CHLORIDE 0.9% IV SOLUTION
250.0000 mL | Freq: Once | INTRAVENOUS | Status: AC
  Administered 2018-10-01: 250 mL via INTRAVENOUS
  Filled 2018-10-01: qty 250

## 2018-10-01 NOTE — Patient Instructions (Signed)

## 2018-10-01 NOTE — Telephone Encounter (Signed)
CRITICAL VALUE STICKER  CRITICAL VALUE: Hgb 8.3, Plt 5  DATE & TIME NOTIFIED: 25  MESSENGER (representative from lab):Pam  MD NOTIFIED: Dr.Kale   TIME OF NOTIFICATION: 0906  RESPONSE: I unit of platelets today

## 2018-10-02 LAB — BPAM PLATELET PHERESIS
Blood Product Expiration Date: 202008250936
ISSUE DATE / TIME: 202008241054
Unit Type and Rh: 600

## 2018-10-02 LAB — PREPARE PLATELET PHERESIS: Unit division: 0

## 2018-10-14 NOTE — Progress Notes (Signed)
HEMATOLOGY/ONCOLOGY CLINIC NOTE  Date of Service: 10/14/2018  Patient Care Team: Patient, No Pcp Per as PCP - General (General Practice)  CHIEF COMPLAINTS/PURPOSE OF CONSULTATION:  Myelodysplastic Syndrome  Oncologic History:   Shaun Hobbs initially presented to care with pancytopenia and was evaluated with a BM Bx on 05/16/06 which did not reveal evidence of abnormal myeloid maturation or other abnormalities, and was without chromosomal abnormality as well. A BM Bx was repeated on 08/28/07 which did reveal dyserythropoiesis and some dysplasia. A 04/17/12 Bm biopsy revealed mild dysplasia, hypercellular marrow with 1.5% blasts, and a minute CD5 positive monoclonal B-cell population detected by flow. 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 His most recent 12/27/17 Bm Bx revealed 30% cellularity with trilineage hematopoiesis, mild megakaryocytic hyperplasia with dyspoietic changes, mild erythroid hyperplasia with ringed sideroblasts, and a minute population of monoclonal B-cells detected by flow.   The pt started 62m Revlimid for 3 months, beginning 09/03/10 "without noticeable improvement." He later began Vidaza, and completed his 21st cycle the week of November 30, 2017.  HISTORY OF PRESENTING ILLNESS:   Shaun LINCKSis a wonderful 85y.o. male who [place] [facility] [place] [facility]. He is accompanied today by his wife and daughter. The pt reports that he is doing well overall.  The pt's wife reports that the first thing that occurred related to the patient's MDS was that his PLT were seen to be reduced to 99k, 12 years ago. The pt was evaluated with repeat BM biopsies over the last 12 years, and began Revlimid  for only 3 months in 2012. The pt's wife notes that he began Vidaza a little less than two years ago, after his PLT dropped to about 60k, and his last dose of Vidaza was in late October 2019. The pt has had one blood transfusion ever, which was in November 2019. The pt's wife notes that during this time of not taking Vidaza, she has not noticed a change in her husband's energy levels or day to day activities. The pt denies dizziness, light headedness, fatigue, abnormal bruising, nose bleeds, gum bleeds, or other concerns for bleeding. The pt and wife deny recent or frequent infections.  The pt's wife notes that Vidaza was stopped to "take a break," trend his counts, [place] last [place] Bx was November 2019.  The pt and his wife note that there have not been other recent medical concerns. She notes that the pt's last A1C was 4.9.  The pt's wife notes that he is current with his annual flu vaccine, both every 5-year Prevnar and Pneumovax, and shingles vaccine.  The pt's wife notes that the pt's memory is "slowly decreasing," and he no longer drives. The pt's [facility]," and has taken Aricept for 20 years.  Most recent lab results (02/19/18) of CBC w/diff and CMP is as follows: all values are WNL except for WBC at 1.5k, RBC at 2.42, HGB at 7.9, HCT at 23.3, MCH at 32.2, PLT at 28k, RDW at 18.8, ANC at 700, Lymphocytes at 600, Monocytes at 200, Glucose at 111, BUN at 28.  On review of systems, pt  reports stable energy levels, eating well, stable weight, good appetite, and denies fevers, fatigue, light headedness, dizziness, nose bleeds, gum bleeds, abnormal bruising, other concerns for bleeding, falls, recent infections, frequent infections, bone pains, problems passing urine, abdominal pains, lower abdominal pains, and any other symptoms.  On PMHx the pt reports MDS, HLD, Early onset Alzheimer's  dementia, Hypothyroidism, hypertrophy or prostate with urinary obstruction.   Interval History:   Shaun Hobbs returns today for management and evaluation of his MDS. The patient's last visit with [place] was on 08/20/2018. He is accompanied today by his wife.The pt reports that he is doing well overall.  The pt reports that he has been eating well and snacking a lot. The pt's wife states that there has not been significant bleeding in the interim but does note small petechiae on his head. She also notices that pt has increased energy after blood/platelet transfusions.  She notes that he often falls asleep when he sits down. [place]   Lab results today (10/16/18) of CBC w/diff and CMP is as follows: all values are WNL except for WBC at 1.4K, RBC at 2.59, Hgb at 7.7, HCT at 24.9, RDW at 23.0, Platelets at 6K, Neutro Abs at 0.7K, Lymphs Abs at 0.6K, Glucose at 125, BUN at 24, Calcium at 8.4, Total Protein at 6.0.   On review of systems, pt reports SOB, normal bowel movements and denies gum bleeding, GI bleeding, other bleeding concerns, fevers, infection issues, abdominal pain, leg swelling and any other symptoms.    MEDICAL HISTORY:  MDS, HLD, Early onset Alzheimer's dementia, Hypothyroidism, hypertrophy or prostate with urinary obstruction.   SURGICAL HISTORY: BM Bx   SOCIAL HISTORY: Social History   Socioeconomic History  . Marital status: Married    Spouse name: Not on file  . Number of children: Not on file  . Years of education: Not on file  . Highest education level: Not on file  Occupational History  . Not on file  Social Needs  . Financial resource strain: Not on file  . Food insecurity    Worry: Not on file    Inability: Not on file  . Transportation needs    Medical: Not on file    Non-medical: Not on file  Tobacco Use  . Smoking status: Not on file  Substance and Sexual Activity  . Alcohol use: Not on file   . Drug use: Not on file  . Sexual activity: Not on file  Lifestyle  . Physical activity    Days per week: Not on file    Minutes per session: Not on file  . Stress: Not on file  Relationships  . Social Herbalist on phone: Not on file    Gets together: Not on file    Attends religious service: Not on file    Active member of club or organization: Not on file    Attends meetings of clubs or organizations: Not on file    Relationship status: Not on file  . Intimate partner violence    Fear of current or ex partner: Not on file    Emotionally abused: Not on file    Physically abused: Not on file    Forced sexual activity: Not on file  Other Topics Concern  . Not on file  Social History Narrative  . Not on file    FAMILY HISTORY: No family history on file.  ALLERGIES:  is allergic to sulfa antibiotics.  MEDICATIONS:  Current Outpatient Medications  Medication Sig Dispense Refill  . Atorvastatin Calcium (LIPITOR PO) Take 5 mg by mouth at bedtime.    . Cholecalciferol (VITAMIN D3 PO) Take by mouth daily.    Marland Kitchen donepezil (ARICEPT) 10 MG tablet Take 10 mg by mouth at bedtime.    Marland Kitchen levothyroxine (SYNTHROID, LEVOTHROID) 100 MCG tablet Take 100 mcg by mouth daily before breakfast.    . MAGNESIUM PO Take by mouth.    . sertraline (ZOLOFT) 100 MG tablet Take 100 mg by mouth at bedtime.    . tamsulosin (FLOMAX) 0.4 MG CAPS capsule Take 0.4 mg by mouth at bedtime.     No current facility-administered medications for this visit.     REVIEW OF SYSTEMS:    A 10+ POINT REVIEW OF SYSTEMS WAS OBTAINED including neurology, dermatology, psychiatry, cardiac, respiratory, lymph, extremities, GI, GU, Musculoskeletal, constitutional, breasts, reproductive, HEENT.  All pertinent positives are noted in the HPI.  All others are negative.   PHYSICAL EXAMINATION: ECOG PERFORMANCE STATUS: 2-3   . Vitals:   10/16/18 0857  BP: (!) 116/43  Pulse: 82  Resp: 18  Temp: 98.2 F (36.8 C)   SpO2: 100%   Filed Weights   10/16/18 0857  Weight: 166 lb 11.2 oz (75.6 kg)   .Body mass index is 26.11 kg/m.  GENERAL:alert, in no acute distress and comfortable SKIN: no acute rashes, no significant lesions EYES: conjunctiva are pink and non-injected, sclera anicteric OROPHARYNX: MMM, no exudates, no oropharyngeal erythema or ulceration NECK: supple, no JVD LYMPH:  no palpable lymphadenopathy in the cervical, axillary or inguinal regions LUNGS: clear to auscultation b/l with normal respiratory effort HEART: regular rate & rhythm ABDOMEN:  normoactive bowel sounds , non tender, not distended. No palpable hepatosplenomegaly.  Extremity: no pedal edema PSYCH: alert & oriented x 3 with fluent speech NEURO: no focal motor/sensory deficits   LABORATORY DATA:  I have reviewed the data as listed  . CBC Latest Ref Rng & Units 10/01/2018 09/17/2018 09/03/2018  WBC 4.0 - 10.5 K/uL 1.3(L) 1.2(L) 1.1(L)  Hemoglobin 13.0 - 17.0 g/dL 8.3(L) 8.0(L) 8.7(L)  Hematocrit 39.0 - 52.0 % 26.8(L) 26.1(L) 28.5(L)  Platelets 150 - 400 K/uL 5(LL) 5(LL) 5(LL)   ANC 500 . CMP Latest Ref Rng & Units 10/01/2018 09/17/2018 08/20/2018  Glucose 70 - 99 mg/dL 106(H) 104(H) 92  BUN 8 - 23 mg/dL 21 22 28(H)  Creatinine 0.61 - 1.24 mg/dL 0.94 0.99 0.91  Sodium 135 - 145 mmol/L 139 140 139  Potassium 3.5 - 5.1 mmol/L 4.3 4.1 4.3  Chloride 98 - 111 mmol/L 108 108 109  CO2 22 - 32 mmol/L '24 22 25  ' Calcium 8.9 - 10.3 mg/dL 8.3(L) 8.6(L) 8.4(L)  Total Protein 6.5 - 8.1 g/dL 6.0(L) 6.1(L) 6.2(L)  Total Bilirubin 0.3 - 1.2 mg/dL 0.5 0.5 0.7  Alkaline Phos 38 - 126 U/L 54 49 50  AST 15 - 41 U/L '17 17 15  ' ALT 0 - 44 U/L '10 9 9    ' CBC w/diff and CMP:    11/19/15 BM Bx:   11/19/15 Cytogenetics:   12/27/17 Bm Bx:    RADIOGRAPHIC STUDIES: I have personally reviewed the radiological images as listed and agreed with the findings in the report. No results found.  ASSESSMENT & PLAN:  85 y.o. male with:   1. 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 No evidence for a lymphoproliferative disorder. Normal cytogenetics. 08/28/07 BM Bx revealed mild dyserythropoiesis and a suggestion of some dysplasia within the myeloid series. 04/17/12 BM Bx revealed hypercellular marrow with 50% cellularity, 1.5% blasts, with trilineage hematopoiesis, marrow monocytosis, mild myeloid and megakaryocytic dysplasia, adequate storage and iron utilization, no ring sideroblasts. Small monoclonal B lymphocytic population of uncertain significance. 11/19/15 BM Bx revealed Hypercellular marrow for age about 70% with panmyelosis including increased megakaryocytes with atypia, moderate marrow monocytosis about 15-20% and <5% blasts. Minute CD5 positive monoclonal B-cell population. Storage iron present. 12/27/17 BM Bx revealed normocellular marrow for age with 30% cellularity and trilineage hematopoiesis, mild megakaryocytic hyperplasia with dyspoietic changes, mild erythroid hyperplasia with ringed sideroblasts, and minute population of monoclonal B cells detected by flow.  S/p 21 cycles of Vidaza completed in the week of November 30, 2017. Previous oncologic notes suggest that the patient's pancytopenia was continuing to worsen through [place] treatment.  Labs upon initial presentation from 02/19/18, WBC at 1.5k, HGB at 7.9, PLT at 28k, ANC at 700   PLAN: -Discussed pt labwork today, 10/16/18; all values are WNL except for WBC at 1.4K, RBC at 2.59, Hgb at 7.7, HCT at 24.9, RDW at 23.0, Platelets at 6K, Neutro Abs at 0.7K, Lymphs Abs at 0.6K, Glucose at 125, BUN at 24, Calcium at 8.4, Total Protein at 6.0.  -Discussed the possiblility of placing pt on Revlimid but will not move to do at this time due to concerns of  inciting thrombocytpenia and and increasing transfusion needs. -Will continue transfusion schedule for 1 Unit of PRBC and 1 Unit of platelets every 2 weeks  -Will see back in 8 weeks  -Advised pt to contact if symptoms change significantly in the interim   FOLLOW UP: Plz schedule labs and 1Unit of PRBC and 1 Unit of platelets q2weeks x 6 RTC with Dr Irene Limbo in 8 weeks     The total time spent in the appt was 20 minutes and more than 50% was on counseling and direct patient cares.  All of the patient's questions were answered with apparent satisfaction. The patient knows to call the clinic with any problems, questions or concerns.   Sullivan Lone MD [place] AAHIVMS [facility] Hematology/Oncology [facility]  (Office):       [phone] (Work cell):  [phone] (Fax):           [phone]  10/14/2018 10:18 PM  I, Yevette Edwards, am acting as a scribe for Dr. Sullivan Lone.   .I have reviewed the above documentation for accuracy and completeness, and I agree with the above. Brunetta Genera MD

## 2018-10-16 ENCOUNTER — Inpatient Hospital Stay: Payer: Medicare Other

## 2018-10-16 ENCOUNTER — Inpatient Hospital Stay: Payer: Medicare Other | Attending: Hematology

## 2018-10-16 ENCOUNTER — Other Ambulatory Visit: Payer: Self-pay

## 2018-10-16 ENCOUNTER — Inpatient Hospital Stay (HOSPITAL_BASED_OUTPATIENT_CLINIC_OR_DEPARTMENT_OTHER): Payer: Medicare Other | Admitting: Hematology

## 2018-10-16 ENCOUNTER — Other Ambulatory Visit: Payer: Self-pay | Admitting: *Deleted

## 2018-10-16 VITALS — BP 116/43 | HR 82 | Temp 98.2°F | Resp 18 | Ht 67.0 in | Wt 166.7 lb

## 2018-10-16 DIAGNOSIS — G300 Alzheimer's disease with early onset: Secondary | ICD-10-CM | POA: Diagnosis not present

## 2018-10-16 DIAGNOSIS — D696 Thrombocytopenia, unspecified: Secondary | ICD-10-CM | POA: Diagnosis not present

## 2018-10-16 DIAGNOSIS — D469 Myelodysplastic syndrome, unspecified: Secondary | ICD-10-CM

## 2018-10-16 DIAGNOSIS — D649 Anemia, unspecified: Secondary | ICD-10-CM

## 2018-10-16 DIAGNOSIS — D61818 Other pancytopenia: Secondary | ICD-10-CM

## 2018-10-16 DIAGNOSIS — Z79899 Other long term (current) drug therapy: Secondary | ICD-10-CM | POA: Insufficient documentation

## 2018-10-16 DIAGNOSIS — E039 Hypothyroidism, unspecified: Secondary | ICD-10-CM | POA: Insufficient documentation

## 2018-10-16 DIAGNOSIS — E785 Hyperlipidemia, unspecified: Secondary | ICD-10-CM | POA: Diagnosis not present

## 2018-10-16 DIAGNOSIS — D72821 Monocytosis (symptomatic): Secondary | ICD-10-CM | POA: Diagnosis not present

## 2018-10-16 LAB — CBC WITH DIFFERENTIAL/PLATELET
Abs Immature Granulocytes: 0 10*3/uL (ref 0.00–0.07)
Basophils Absolute: 0 10*3/uL (ref 0.0–0.1)
Basophils Relative: 0 %
Eosinophils Absolute: 0 10*3/uL (ref 0.0–0.5)
Eosinophils Relative: 1 %
HCT: 24.9 % — ABNORMAL LOW (ref 39.0–52.0)
Hemoglobin: 7.7 g/dL — ABNORMAL LOW (ref 13.0–17.0)
Immature Granulocytes: 0 %
Lymphocytes Relative: 39 %
Lymphs Abs: 0.6 10*3/uL — ABNORMAL LOW (ref 0.7–4.0)
MCH: 29.7 pg (ref 26.0–34.0)
MCHC: 30.9 g/dL (ref 30.0–36.0)
MCV: 96.1 fL (ref 80.0–100.0)
Monocytes Absolute: 0.2 10*3/uL (ref 0.1–1.0)
Monocytes Relative: 11 %
Neutro Abs: 0.7 10*3/uL — ABNORMAL LOW (ref 1.7–7.7)
Neutrophils Relative %: 49 %
Platelets: 6 10*3/uL — CL (ref 150–400)
RBC: 2.59 MIL/uL — ABNORMAL LOW (ref 4.22–5.81)
RDW: 23 % — ABNORMAL HIGH (ref 11.5–15.5)
WBC: 1.4 10*3/uL — ABNORMAL LOW (ref 4.0–10.5)
nRBC: 0 % (ref 0.0–0.2)

## 2018-10-16 LAB — SAMPLE TO BLOOD BANK

## 2018-10-16 LAB — CMP (CANCER CENTER ONLY)
ALT: 9 U/L (ref 0–44)
AST: 17 U/L (ref 15–41)
Albumin: 4.2 g/dL (ref 3.5–5.0)
Alkaline Phosphatase: 47 U/L (ref 38–126)
Anion gap: 7 (ref 5–15)
BUN: 24 mg/dL — ABNORMAL HIGH (ref 8–23)
CO2: 25 mmol/L (ref 22–32)
Calcium: 8.4 mg/dL — ABNORMAL LOW (ref 8.9–10.3)
Chloride: 107 mmol/L (ref 98–111)
Creatinine: 1.08 mg/dL (ref 0.61–1.24)
GFR, Est AFR Am: >60 mL/min (ref >60)
GFR, Estimated: >60 mL/min (ref >60)
Glucose, Bld: 125 mg/dL — ABNORMAL HIGH (ref 70–99)
Potassium: 4.6 mmol/L (ref 3.5–5.1)
Sodium: 139 mmol/L (ref 135–145)
Total Bilirubin: 0.4 mg/dL (ref 0.3–1.2)
Total Protein: 6 g/dL — ABNORMAL LOW (ref 6.5–8.1)

## 2018-10-16 LAB — PREPARE RBC (CROSSMATCH)

## 2018-10-16 MED ORDER — ACETAMINOPHEN 325 MG PO TABS
ORAL_TABLET | ORAL | Status: AC
  Filled 2018-10-16: qty 2

## 2018-10-16 MED ORDER — ACETAMINOPHEN 325 MG PO TABS
650.0000 mg | ORAL_TABLET | Freq: Once | ORAL | Status: AC
  Administered 2018-10-16: 650 mg via ORAL

## 2018-10-16 MED ORDER — METHYLPREDNISOLONE SODIUM SUCC 125 MG IJ SOLR
60.0000 mg | Freq: Once | INTRAMUSCULAR | Status: AC
  Administered 2018-10-16: 60 mg via INTRAVENOUS

## 2018-10-16 MED ORDER — SODIUM CHLORIDE 0.9% IV SOLUTION
250.0000 mL | Freq: Once | INTRAVENOUS | Status: AC
  Administered 2018-10-16: 250 mL via INTRAVENOUS
  Filled 2018-10-16: qty 250

## 2018-10-16 MED ORDER — METHYLPREDNISOLONE SODIUM SUCC 125 MG IJ SOLR
INTRAMUSCULAR | Status: AC
  Filled 2018-10-16: qty 2

## 2018-10-16 NOTE — Progress Notes (Signed)
CRITICAL VALUE: Plt 6 DATE & TIME NOTIFIED: 10/16/2018  9am MESSENGER (representative from lab): Faythe Dingwall MD NOTIFIED: Dr. Irene Limbo TIME OF NOTIFICATION: 9:10 am RESPONSE: ordered transfusion today - 1 unit platelets and 1 unit PRBCs

## 2018-10-16 NOTE — Patient Instructions (Signed)
Blood Transfusion, Adult  A blood transfusion is a procedure in which you receive donated blood, including plasma, platelets, and red blood cells, through an IV tube. You may need a blood transfusion because of illness, surgery, or injury. The blood may come from a donor. You may also be able to donate blood for yourself (autologous blood donation) before a surgery if you know that you might require a blood transfusion. The blood given in a transfusion is made up of different types of cells. You may receive:  Red blood cells. These carry oxygen to the cells in the body.  White blood cells. These help you fight infections.  Platelets. These help your blood to clot.  Plasma. This is the liquid part of your blood and it helps with fluid imbalances. If you have hemophilia or another clotting disorder, you may also receive other types of blood products. Tell a health care provider about:  Any allergies you have.  All medicines you are taking, including vitamins, herbs, eye drops, creams, and over-the-counter medicines.  Any problems you or family members have had with anesthetic medicines.  Any blood disorders you have.  Any surgeries you have had.  Any medical conditions you have, including any recent fever or cold symptoms.  Whether you are pregnant or may be pregnant.  Any previous reactions you have had during a blood transfusion. What are the risks? Generally, this is a safe procedure. However, problems may occur, including:  Having an allergic reaction to something in the donated blood. Hives and itching may be symptoms of this type of reaction.  Fever. This may be a reaction to the white blood cells in the transfused blood. Nausea or chest pain may accompany a fever.  Iron overload. This can happen from having many transfusions.  Transfusion-related acute lung injury (TRALI). This is a rare reaction that causes lung damage. The cause is not known.TRALI can occur within hours  of a transfusion or several days later.  Sudden (acute) or delayed hemolytic reactions. This happens if your blood does not match the cells in your transfusion. Your body's defense system (immune system) may try to attack the new cells. This complication is rare. The symptoms include fever, chills, nausea, and low back pain or chest pain.  Infection or disease transmission. This is rare. What happens before the procedure?  You will have a blood test to determine your blood type. This is necessary to know what kind of blood your body will accept and to match it to the donor blood.  If you are going to have a planned surgery, you may be able to do an autologous blood donation. This may be done in case you need to have a transfusion.  If you have had an allergic reaction to a transfusion in the past, you may be given medicine to help prevent a reaction. This medicine may be given to you by mouth or through an IV tube.  You will have your temperature, blood pressure, and pulse monitored before the transfusion.  Follow instructions from your health care provider about eating and drinking restrictions.  Ask your health care provider about: ? Changing or stopping your regular medicines. This is especially important if you are taking diabetes medicines or blood thinners. ? Taking medicines such as aspirin and ibuprofen. These medicines can thin your blood. Do not take these medicines before your procedure if your health care provider instructs you not to. What happens during the procedure?  An IV tube will be   inserted into one of your veins.  The bag of donated blood will be attached to your IV tube. The blood will then enter through your vein.  Your temperature, blood pressure, and pulse will be monitored regularly during the transfusion. This monitoring is done to detect early signs of a transfusion reaction.  If you have any signs or symptoms of a reaction, your transfusion will be stopped and  you may be given medicine.  When the transfusion is complete, your IV tube will be removed.  Pressure may be applied to the IV site for a few minutes.  A bandage (dressing) will be applied. The procedure may vary among health care providers and hospitals. What happens after the procedure?  Your temperature, blood pressure, heart rate, breathing rate, and blood oxygen level will be monitored often.  Your blood may be tested to see how you are responding to the transfusion.  You may be warmed with fluids or blankets to maintain a normal body temperature. Summary  A blood transfusion is a procedure in which you receive donated blood, including plasma, platelets, and red blood cells, through an IV tube.  Your temperature, blood pressure, and pulse will be monitored before, during, and after the transfusion.  Your blood may be tested after the transfusion to see how your body has responded. This information is not intended to replace advice given to you by your health care provider. Make sure you discuss any questions you have with your health care provider. Document Released: 01/22/2000 Document Revised: 12/11/2017 Document Reviewed: 10/22/2015 Elsevier Patient Education  [place].  Platelet Transfusion A platelet transfusion is a procedure in which you receive donated platelets through an IV. Platelets are tiny pieces of blood cells. When you get an injury, platelets clump together in the area to form a blood clot. This helps stop bleeding and is the beginning of the healing process. If you have too few platelets, your blood may have trouble clotting. This may cause you to bleed and bruise very easily. You may need a platelet transfusion if you have a condition that causes a low number of platelets (thrombocytopenia). A platelet transfusion may be used to stop or prevent excessive bleeding. Tell a health care provider about:  Any reactions you have had during previous  transfusions.  Any allergies you have.  All medicines you are taking, including vitamins, herbs, eye drops, creams, and over-the-counter medicines.  Any blood disorders you have.  Any surgeries you have had.  Any medical conditions you have.  Whether you are pregnant or may be pregnant. What are the risks? Generally, this is a safe procedure. However, problems may occur, including:  Fever.  Infection.  Allergic reaction to the donor platelets.  Your body's disease-fighting system (immune system) attacking the donor platelets (hemolytic reaction). This is rare.  A rare reaction that causes lung damage (transfusion-related acute lung injury). What happens before the procedure? Medicines  Ask your health care provider about: ? Changing or stopping your regular medicines. This is especially important if you are taking diabetes medicines or blood thinners. ? Taking medicines such as aspirin and ibuprofen. These medicines can thin your blood. Do not take these medicines unless your health care provider tells you to take them. ? Taking over-the-counter medicines, vitamins, herbs, and supplements. General instructions  You will have a blood test to determine your blood type. Your blood type determines what kind of platelets you will be given.  Follow instructions from your health care provider about  eating or drinking restrictions.  If you have had an allergic reaction to a transfusion in the past, you may be given medicine to help prevent a reaction.  Your temperature, blood pressure, pulse, and breathing will be monitored. What happens during the procedure?   An IV will be inserted into one of your veins.  For your safety, two health care providers will verify your identity along with the donor platelets about to be infused.  A bag of donor platelets will be connected to your IV. The platelets will flow into your bloodstream. This usually takes 30-60 minutes.  Your  temperature, blood pressure, pulse, and breathing will be monitored during the transfusion. This helps detect early signs of any reaction.  You will also be monitored for other symptoms that may indicate a reaction, including chills, hives, or itching.  If you have signs of a reaction at any time, your transfusion will be stopped, and you may be given medicine to help manage the reaction.  When your transfusion is complete, your IV will be removed.  Pressure may be applied to the IV site for a few minutes to stop any bleeding.  The IV site will be covered with a bandage (dressing). The procedure may vary among health care providers and hospitals. What happens after the procedure?  Your blood pressure, temperature, pulse, and breathing will be monitored until you leave the hospital or clinic.  You may have some bruising and soreness at your IV site. Follow these instructions at home: Medicines  Take over-the-counter and prescription medicines only as told by your health care provider.  Talk with your health care provider before you take any medicines that contain aspirin or NSAIDs. These medicines increase your risk for dangerous bleeding. General instructions  Change or remove your dressing as told by your health care provider.  Return to your normal activities as told by your health care provider. Ask your health care provider what activities are safe for you.  Do not take baths, swim, or use a hot tub until your health care provider approves. Ask your health care provider if you may take showers.  Check your IV site every day for signs of infection. Check for: ? Redness, swelling, or pain. ? Fluid or blood. If fluid or blood drains from your IV site, use your hands to press down firmly on a bandage covering the area for a minute or two. Doing this should stop the bleeding. ? Warmth. ? Pus or a bad smell.  Keep all follow-up visits as told by your health care provider. This is  important. Contact a health care provider if you have:  A headache that does not go away with medicine.  Hives, rash, or itchy skin.  Nausea or vomiting.  Unusual tiredness or weakness.  Signs of infection at your IV site. Get help right away if:  You have a fever or chills.  You urinate less often than usual.  Your urine is darker colored than normal.  You have any of the following: ? Trouble breathing. ? Pain in your back, abdomen, or chest. ? Cool, clammy skin. ? A fast heartbeat. Summary  Platelets are tiny pieces of blood cells that clump together to form a blood clot when you have an injury. If you have too few platelets, your blood may have trouble clotting.  A platelet transfusion is a procedure in which you receive donated platelets through an IV.  A platelet transfusion may be used to stop or prevent excessive  bleeding.  After the procedure, check your IV site every day for signs of infection, including redness, swelling, pain, or warmth. This information is not intended to replace advice given to you by your health care provider. Make sure you discuss any questions you have with your health care provider. Document Released: 11/21/2006 Document Revised: 03/01/2017 Document Reviewed: 03/01/2017 Elsevier Patient Education  2020 [employer].

## 2018-10-17 LAB — TYPE AND SCREEN
ABO/RH(D): A POS
Antibody Screen: NEGATIVE
Unit division: 0

## 2018-10-17 LAB — PREPARE PLATELET PHERESIS: Unit division: 0

## 2018-10-17 LAB — BPAM RBC
Blood Product Expiration Date: 202009302359
ISSUE DATE / TIME: 202009081040
Unit Type and Rh: 6200

## 2018-10-17 LAB — BPAM PLATELET PHERESIS
Blood Product Expiration Date: 202009102359
ISSUE DATE / TIME: 202009081250
Unit Type and Rh: 6200

## 2018-10-29 ENCOUNTER — Other Ambulatory Visit: Payer: Self-pay

## 2018-10-29 ENCOUNTER — Other Ambulatory Visit: Payer: Self-pay | Admitting: *Deleted

## 2018-10-29 ENCOUNTER — Telehealth: Payer: Self-pay | Admitting: *Deleted

## 2018-10-29 ENCOUNTER — Inpatient Hospital Stay: Payer: Medicare Other

## 2018-10-29 VITALS — BP 115/57 | HR 61 | Temp 97.9°F | Resp 16

## 2018-10-29 DIAGNOSIS — D61818 Other pancytopenia: Secondary | ICD-10-CM

## 2018-10-29 DIAGNOSIS — D469 Myelodysplastic syndrome, unspecified: Secondary | ICD-10-CM | POA: Diagnosis not present

## 2018-10-29 DIAGNOSIS — D649 Anemia, unspecified: Secondary | ICD-10-CM

## 2018-10-29 DIAGNOSIS — D696 Thrombocytopenia, unspecified: Secondary | ICD-10-CM

## 2018-10-29 LAB — CBC WITH DIFFERENTIAL/PLATELET
Abs Immature Granulocytes: 0 10*3/uL (ref 0.00–0.07)
Basophils Absolute: 0 10*3/uL (ref 0.0–0.1)
Basophils Relative: 0 %
Eosinophils Absolute: 0 10*3/uL (ref 0.0–0.5)
Eosinophils Relative: 0 %
HCT: 26.9 % — ABNORMAL LOW (ref 39.0–52.0)
Hemoglobin: 8.4 g/dL — ABNORMAL LOW (ref 13.0–17.0)
Immature Granulocytes: 0 %
Lymphocytes Relative: 45 %
Lymphs Abs: 0.5 10*3/uL — ABNORMAL LOW (ref 0.7–4.0)
MCH: 29.5 pg (ref 26.0–34.0)
MCHC: 31.2 g/dL (ref 30.0–36.0)
MCV: 94.4 fL (ref 80.0–100.0)
Monocytes Absolute: 0.1 10*3/uL (ref 0.1–1.0)
Monocytes Relative: 8 %
Neutro Abs: 0.5 10*3/uL — ABNORMAL LOW (ref 1.7–7.7)
Neutrophils Relative %: 47 %
Platelets: <5 10*3/uL — CL (ref 150–400)
RBC: 2.85 MIL/uL — ABNORMAL LOW (ref 4.22–5.81)
RDW: 21.9 % — ABNORMAL HIGH (ref 11.5–15.5)
WBC: 1.1 10*3/uL — ABNORMAL LOW (ref 4.0–10.5)
nRBC: 0 % (ref 0.0–0.2)

## 2018-10-29 LAB — CMP (CANCER CENTER ONLY)
ALT: 8 U/L (ref 0–44)
AST: 17 U/L (ref 15–41)
Albumin: 4.2 g/dL (ref 3.5–5.0)
Alkaline Phosphatase: 58 U/L (ref 38–126)
Anion gap: 8 (ref 5–15)
BUN: 23 mg/dL (ref 8–23)
CO2: 24 mmol/L (ref 22–32)
Calcium: 8.5 mg/dL — ABNORMAL LOW (ref 8.9–10.3)
Chloride: 107 mmol/L (ref 98–111)
Creatinine: 1.02 mg/dL (ref 0.61–1.24)
GFR, Est AFR Am: >60 mL/min (ref >60)
GFR, Estimated: >60 mL/min (ref >60)
Glucose, Bld: 183 mg/dL — ABNORMAL HIGH (ref 70–99)
Potassium: 4.2 mmol/L (ref 3.5–5.1)
Sodium: 139 mmol/L (ref 135–145)
Total Bilirubin: 0.4 mg/dL (ref 0.3–1.2)
Total Protein: 6.1 g/dL — ABNORMAL LOW (ref 6.5–8.1)

## 2018-10-29 LAB — SAMPLE TO BLOOD BANK

## 2018-10-29 MED ORDER — METHYLPREDNISOLONE SODIUM SUCC 125 MG IJ SOLR
INTRAMUSCULAR | Status: AC
  Filled 2018-10-29: qty 2

## 2018-10-29 MED ORDER — METHYLPREDNISOLONE SODIUM SUCC 125 MG IJ SOLR
60.0000 mg | Freq: Once | INTRAMUSCULAR | Status: AC
  Administered 2018-10-29: 60 mg via INTRAVENOUS

## 2018-10-29 MED ORDER — ACETAMINOPHEN 325 MG PO TABS
ORAL_TABLET | ORAL | Status: AC
  Filled 2018-10-29: qty 2

## 2018-10-29 MED ORDER — SODIUM CHLORIDE 0.9 % IV SOLN
Freq: Once | INTRAVENOUS | Status: AC
  Administered 2018-10-29: 09:00:00 via INTRAVENOUS
  Filled 2018-10-29: qty 250

## 2018-10-29 MED ORDER — ACETAMINOPHEN 325 MG PO TABS
650.0000 mg | ORAL_TABLET | Freq: Once | ORAL | Status: AC
  Administered 2018-10-29: 650 mg via ORAL

## 2018-10-29 NOTE — Patient Instructions (Signed)

## 2018-10-29 NOTE — Telephone Encounter (Signed)
CRITICAL VALUE: Platelets less than 5, Hgb 8.4 DATE & TIME NOTIFIED: 10/29/2018, 8:20 AM MESSENGER (representative from lab): Pam MD NOTIFIED: Dr.Kale TIME OF NOTIFICATION: 9:10 RESPONSE: MD ordered 2 units of platelets to be transfused today (patient here for transfusion appt following lab appt)

## 2018-10-30 LAB — BPAM PLATELET PHERESIS
Blood Product Expiration Date: 202009212359
Blood Product Expiration Date: 202009232359
ISSUE DATE / TIME: 202009211020
ISSUE DATE / TIME: 202009211141
Unit Type and Rh: 6200
Unit Type and Rh: 7300

## 2018-10-30 LAB — PREPARE PLATELET PHERESIS
Unit division: 0
Unit division: 0

## 2018-11-13 ENCOUNTER — Other Ambulatory Visit: Payer: Self-pay

## 2018-11-13 ENCOUNTER — Other Ambulatory Visit: Payer: Self-pay | Admitting: *Deleted

## 2018-11-13 ENCOUNTER — Inpatient Hospital Stay: Payer: Medicare Other

## 2018-11-13 ENCOUNTER — Telehealth: Payer: Self-pay | Admitting: *Deleted

## 2018-11-13 ENCOUNTER — Inpatient Hospital Stay: Payer: Medicare Other | Attending: Hematology

## 2018-11-13 DIAGNOSIS — D469 Myelodysplastic syndrome, unspecified: Secondary | ICD-10-CM | POA: Diagnosis not present

## 2018-11-13 DIAGNOSIS — D61818 Other pancytopenia: Secondary | ICD-10-CM

## 2018-11-13 DIAGNOSIS — D649 Anemia, unspecified: Secondary | ICD-10-CM

## 2018-11-13 LAB — CMP (CANCER CENTER ONLY)
ALT: 7 U/L (ref 0–44)
AST: 13 U/L — ABNORMAL LOW (ref 15–41)
Albumin: 4 g/dL (ref 3.5–5.0)
Alkaline Phosphatase: 51 U/L (ref 38–126)
Anion gap: 8 (ref 5–15)
BUN: 23 mg/dL (ref 8–23)
CO2: 24 mmol/L (ref 22–32)
Calcium: 8.4 mg/dL — ABNORMAL LOW (ref 8.9–10.3)
Chloride: 107 mmol/L (ref 98–111)
Creatinine: 0.91 mg/dL (ref 0.61–1.24)
GFR, Est AFR Am: >60 mL/min (ref >60)
GFR, Estimated: >60 mL/min (ref >60)
Glucose, Bld: 146 mg/dL — ABNORMAL HIGH (ref 70–99)
Potassium: 4.2 mmol/L (ref 3.5–5.1)
Sodium: 139 mmol/L (ref 135–145)
Total Bilirubin: 0.6 mg/dL (ref 0.3–1.2)
Total Protein: 5.9 g/dL — ABNORMAL LOW (ref 6.5–8.1)

## 2018-11-13 LAB — CBC WITH DIFFERENTIAL/PLATELET
Abs Immature Granulocytes: 0.03 10*3/uL (ref 0.00–0.07)
Basophils Absolute: 0 10*3/uL (ref 0.0–0.1)
Basophils Relative: 0 %
Eosinophils Absolute: 0 10*3/uL (ref 0.0–0.5)
Eosinophils Relative: 0 %
HCT: 23.7 % — ABNORMAL LOW (ref 39.0–52.0)
Hemoglobin: 7.5 g/dL — ABNORMAL LOW (ref 13.0–17.0)
Immature Granulocytes: 2 %
Lymphocytes Relative: 33 %
Lymphs Abs: 0.5 10*3/uL — ABNORMAL LOW (ref 0.7–4.0)
MCH: 30 pg (ref 26.0–34.0)
MCHC: 31.6 g/dL (ref 30.0–36.0)
MCV: 94.8 fL (ref 80.0–100.0)
Monocytes Absolute: 0.1 10*3/uL (ref 0.1–1.0)
Monocytes Relative: 8 %
Neutro Abs: 0.8 10*3/uL — ABNORMAL LOW (ref 1.7–7.7)
Neutrophils Relative %: 57 %
Platelets: 6 10*3/uL — CL (ref 150–400)
RBC: 2.5 MIL/uL — ABNORMAL LOW (ref 4.22–5.81)
RDW: 23 % — ABNORMAL HIGH (ref 11.5–15.5)
WBC: 1.4 10*3/uL — ABNORMAL LOW (ref 4.0–10.5)
nRBC: 0 % (ref 0.0–0.2)

## 2018-11-13 LAB — SAMPLE TO BLOOD BANK

## 2018-11-13 LAB — PREPARE RBC (CROSSMATCH)

## 2018-11-13 MED ORDER — FAMOTIDINE 20 MG PO TABS
20.0000 mg | ORAL_TABLET | Freq: Once | ORAL | Status: AC
  Administered 2018-11-13: 20 mg via ORAL

## 2018-11-13 MED ORDER — ACETAMINOPHEN 325 MG PO TABS
650.0000 mg | ORAL_TABLET | Freq: Once | ORAL | Status: AC
  Administered 2018-11-13: 650 mg via ORAL

## 2018-11-13 MED ORDER — FAMOTIDINE 20 MG PO TABS: 20.0000 mg | ORAL_TABLET | Freq: Once | ORAL | Status: DC

## 2018-11-13 MED ORDER — METHYLPREDNISOLONE SODIUM SUCC 125 MG IJ SOLR
INTRAMUSCULAR | Status: AC
  Filled 2018-11-13: qty 2

## 2018-11-13 MED ORDER — FAMOTIDINE 20 MG PO TABS
ORAL_TABLET | ORAL | Status: AC
  Filled 2018-11-13: qty 1

## 2018-11-13 MED ORDER — SODIUM CHLORIDE 0.9% IV SOLUTION
250.0000 mL | Freq: Once | INTRAVENOUS | Status: AC
  Administered 2018-11-13: 250 mL via INTRAVENOUS
  Filled 2018-11-13: qty 250

## 2018-11-13 MED ORDER — METHYLPREDNISOLONE SODIUM SUCC 125 MG IJ SOLR
60.0000 mg | Freq: Once | INTRAMUSCULAR | Status: AC
  Administered 2018-11-13: 60 mg via INTRAVENOUS

## 2018-11-13 MED ORDER — ACETAMINOPHEN 325 MG PO TABS
ORAL_TABLET | ORAL | Status: AC
  Filled 2018-11-13: qty 2

## 2018-11-13 NOTE — Telephone Encounter (Signed)
CRITICAL VALUE: HGB 7.5, PLT 6 DATE & TIME NOTIFIED: 11/13/2018, 0915 MESSENGER (representative from lab): Suanne Marker MD NOTIFIED: Dr. Irene Limbo TIME OF NOTIFICATIONXR:[id number] RESPONSE: PRBC and PLT (1 unit each) ordered by MD

## 2018-11-13 NOTE — Patient Instructions (Addendum)
Blood Transfusion, Adult  A blood transfusion is a procedure in which you receive donated blood, including plasma, platelets, and red blood cells, through an IV tube. You may need a blood transfusion because of illness, surgery, or injury. The blood may come from a donor. You may also be able to donate blood for yourself (autologous blood donation) before a surgery if you know that you might require a blood transfusion. The blood given in a transfusion is made up of different types of cells. You may receive:  Red blood cells. These carry oxygen to the cells in the body.  White blood cells. These help you fight infections.  Platelets. These help your blood to clot.  Plasma. This is the liquid part of your blood and it helps with fluid imbalances. If you have hemophilia or another clotting disorder, you may also receive other types of blood products. Tell a health care provider about:  Any allergies you have.  All medicines you are taking, including vitamins, herbs, eye drops, creams, and over-the-counter medicines.  Any problems you or family members have had with anesthetic medicines.  Any blood disorders you have.  Any surgeries you have had.  Any medical conditions you have, including any recent fever or cold symptoms.  Whether you are pregnant or may be pregnant.  Any previous reactions you have had during a blood transfusion. What are the risks? Generally, this is a safe procedure. However, problems may occur, including:  Having an allergic reaction to something in the donated blood. Hives and itching may be symptoms of this type of reaction.  Fever. This may be a reaction to the white blood cells in the transfused blood. Nausea or chest pain may accompany a fever.  Iron overload. This can happen from having many transfusions.  Transfusion-related acute lung injury (TRALI). This is a rare reaction that causes lung damage. The cause is not known.TRALI can occur within hours  of a transfusion or several days later.  Sudden (acute) or delayed hemolytic reactions. This happens if your blood does not match the cells in your transfusion. Your body's defense system (immune system) may try to attack the new cells. This complication is rare. The symptoms include fever, chills, nausea, and low back pain or chest pain.  Infection or disease transmission. This is rare. What happens before the procedure?  You will have a blood test to determine your blood type. This is necessary to know what kind of blood your body will accept and to match it to the donor blood.  If you are going to have a planned surgery, you may be able to do an autologous blood donation. This may be done in case you need to have a transfusion.  If you have had an allergic reaction to a transfusion in the past, you may be given medicine to help prevent a reaction. This medicine may be given to you by mouth or through an IV tube.  You will have your temperature, blood pressure, and pulse monitored before the transfusion.  Follow instructions from your health care provider about eating and drinking restrictions.  Ask your health care provider about: ? Changing or stopping your regular medicines. This is especially important if you are taking diabetes medicines or blood thinners. ? Taking medicines such as aspirin and ibuprofen. These medicines can thin your blood. Do not take these medicines before your procedure if your health care provider instructs you not to. What happens during the procedure?  An IV tube will be   inserted into one of your veins.  The bag of donated blood will be attached to your IV tube. The blood will then enter through your vein.  Your temperature, blood pressure, and pulse will be monitored regularly during the transfusion. This monitoring is done to detect early signs of a transfusion reaction.  If you have any signs or symptoms of a reaction, your transfusion will be stopped and  you may be given medicine.  When the transfusion is complete, your IV tube will be removed.  Pressure may be applied to the IV site for a few minutes.  A bandage (dressing) will be applied. The procedure may vary among health care providers and hospitals. What happens after the procedure?  Your temperature, blood pressure, heart rate, breathing rate, and blood oxygen level will be monitored often.  Your blood may be tested to see how you are responding to the transfusion.  You may be warmed with fluids or blankets to maintain a normal body temperature. Summary  A blood transfusion is a procedure in which you receive donated blood, including plasma, platelets, and red blood cells, through an IV tube.  Your temperature, blood pressure, and pulse will be monitored before, during, and after the transfusion.  Your blood may be tested after the transfusion to see how your body has responded. This information is not intended to replace advice given to you by your health care provider. Make sure you discuss any questions you have with your health care provider. Document Released: 01/22/2000 Document Revised: 12/11/2017 Document Reviewed: 10/22/2015 Elsevier Patient Education  [place].  Platelet Transfusion A platelet transfusion is a procedure in which you receive donated platelets through an IV. Platelets are tiny pieces of blood cells. When you get an injury, platelets clump together in the area to form a blood clot. This helps stop bleeding and is the beginning of the healing process. If you have too few platelets, your blood may have trouble clotting. This may cause you to bleed and bruise very easily. You may need a platelet transfusion if you have a condition that causes a low number of platelets (thrombocytopenia). A platelet transfusion may be used to stop or prevent excessive bleeding. Tell a health care provider about:  Any reactions you have had during previous  transfusions.  Any allergies you have.  All medicines you are taking, including vitamins, herbs, eye drops, creams, and over-the-counter medicines.  Any blood disorders you have.  Any surgeries you have had.  Any medical conditions you have.  Whether you are pregnant or may be pregnant. What are the risks? Generally, this is a safe procedure. However, problems may occur, including:  Fever.  Infection.  Allergic reaction to the donor platelets.  Your body's disease-fighting system (immune system) attacking the donor platelets (hemolytic reaction). This is rare.  A rare reaction that causes lung damage (transfusion-related acute lung injury). What happens before the procedure? Medicines  Ask your health care provider about: ? Changing or stopping your regular medicines. This is especially important if you are taking diabetes medicines or blood thinners. ? Taking medicines such as aspirin and ibuprofen. These medicines can thin your blood. Do not take these medicines unless your health care provider tells you to take them. ? Taking over-the-counter medicines, vitamins, herbs, and supplements. General instructions  You will have a blood test to determine your blood type. Your blood type determines what kind of platelets you will be given.  Follow instructions from your health care provider about  eating or drinking restrictions.  If you have had an allergic reaction to a transfusion in the past, you may be given medicine to help prevent a reaction.  Your temperature, blood pressure, pulse, and breathing will be monitored. What happens during the procedure?   An IV will be inserted into one of your veins.  For your safety, two health care providers will verify your identity along with the donor platelets about to be infused.  A bag of donor platelets will be connected to your IV. The platelets will flow into your bloodstream. This usually takes 30-60 minutes.  Your  temperature, blood pressure, pulse, and breathing will be monitored during the transfusion. This helps detect early signs of any reaction.  You will also be monitored for other symptoms that may indicate a reaction, including chills, hives, or itching.  If you have signs of a reaction at any time, your transfusion will be stopped, and you may be given medicine to help manage the reaction.  When your transfusion is complete, your IV will be removed.  Pressure may be applied to the IV site for a few minutes to stop any bleeding.  The IV site will be covered with a bandage (dressing). The procedure may vary among health care providers and hospitals. What happens after the procedure?  Your blood pressure, temperature, pulse, and breathing will be monitored until you leave the hospital or clinic.  You may have some bruising and soreness at your IV site. Follow these instructions at home: Medicines  Take over-the-counter and prescription medicines only as told by your health care provider.  Talk with your health care provider before you take any medicines that contain aspirin or NSAIDs. These medicines increase your risk for dangerous bleeding. General instructions  Change or remove your dressing as told by your health care provider.  Return to your normal activities as told by your health care provider. Ask your health care provider what activities are safe for you.  Do not take baths, swim, or use a hot tub until your health care provider approves. Ask your health care provider if you may take showers.  Check your IV site every day for signs of infection. Check for: ? Redness, swelling, or pain. ? Fluid or blood. If fluid or blood drains from your IV site, use your hands to press down firmly on a bandage covering the area for a minute or two. Doing this should stop the bleeding. ? Warmth. ? Pus or a bad smell.  Keep all follow-up visits as told by your health care provider. This is  important. Contact a health care provider if you have:  A headache that does not go away with medicine.  Hives, rash, or itchy skin.  Nausea or vomiting.  Unusual tiredness or weakness.  Signs of infection at your IV site. Get help right away if:  You have a fever or chills.  You urinate less often than usual.  Your urine is darker colored than normal.  You have any of the following: ? Trouble breathing. ? Pain in your back, abdomen, or chest. ? Cool, clammy skin. ? A fast heartbeat. Summary  Platelets are tiny pieces of blood cells that clump together to form a blood clot when you have an injury. If you have too few platelets, your blood may have trouble clotting.  A platelet transfusion is a procedure in which you receive donated platelets through an IV.  A platelet transfusion may be used to stop or prevent excessive  bleeding.  After the procedure, check your IV site every day for signs of infection, including redness, swelling, pain, or warmth. This information is not intended to replace advice given to you by your health care provider. Make sure you discuss any questions you have with your health care provider. Document Released: 11/21/2006 Document Revised: 03/01/2017 Document Reviewed: 03/01/2017 Elsevier Patient Education  2020 [employer].

## 2018-11-14 DIAGNOSIS — N400 Enlarged prostate without lower urinary tract symptoms: Secondary | ICD-10-CM | POA: Insufficient documentation

## 2018-11-14 DIAGNOSIS — D696 Thrombocytopenia, unspecified: Secondary | ICD-10-CM | POA: Insufficient documentation

## 2018-11-14 DIAGNOSIS — R413 Other amnesia: Secondary | ICD-10-CM | POA: Insufficient documentation

## 2018-11-14 LAB — BPAM PLATELET PHERESIS
Blood Product Expiration Date: 202010082359
ISSUE DATE / TIME: 202010061227
Unit Type and Rh: 7300

## 2018-11-14 LAB — PREPARE PLATELET PHERESIS: Unit division: 0

## 2018-11-14 LAB — TYPE AND SCREEN
ABO/RH(D): A POS
Antibody Screen: NEGATIVE
Unit division: 0

## 2018-11-14 LAB — BPAM RBC
Blood Product Expiration Date: 202010262359
ISSUE DATE / TIME: 202010061031
Unit Type and Rh: 6200

## 2018-11-25 ENCOUNTER — Inpatient Hospital Stay (HOSPITAL_COMMUNITY)
Admission: EM | Admit: 2018-11-25 | Discharge: 2018-11-30 | DRG: 177 | Disposition: A | Discharge status: Skilled Nursing Facility | Payer: Medicare Other | Attending: Internal Medicine | Admitting: Internal Medicine

## 2018-11-25 ENCOUNTER — Other Ambulatory Visit: Payer: Self-pay

## 2018-11-25 ENCOUNTER — Encounter (HOSPITAL_COMMUNITY): Payer: Self-pay | Admitting: Internal Medicine

## 2018-11-25 ENCOUNTER — Emergency Department (HOSPITAL_COMMUNITY): Payer: Medicare Other

## 2018-11-25 DIAGNOSIS — Z881 Allergy status to other antibiotic agents status: Secondary | ICD-10-CM | POA: Diagnosis not present

## 2018-11-25 DIAGNOSIS — D61818 Other pancytopenia: Secondary | ICD-10-CM | POA: Diagnosis present

## 2018-11-25 DIAGNOSIS — D649 Anemia, unspecified: Secondary | ICD-10-CM

## 2018-11-25 DIAGNOSIS — J159 Unspecified bacterial pneumonia: Secondary | ICD-10-CM | POA: Diagnosis present

## 2018-11-25 DIAGNOSIS — E872 Acidosis: Secondary | ICD-10-CM | POA: Diagnosis present

## 2018-11-25 DIAGNOSIS — D696 Thrombocytopenia, unspecified: Secondary | ICD-10-CM | POA: Diagnosis not present

## 2018-11-25 DIAGNOSIS — Z79899 Other long term (current) drug therapy: Secondary | ICD-10-CM | POA: Diagnosis not present

## 2018-11-25 DIAGNOSIS — J1289 Other viral pneumonia: Secondary | ICD-10-CM | POA: Diagnosis present

## 2018-11-25 DIAGNOSIS — Z66 Do not resuscitate: Secondary | ICD-10-CM | POA: Diagnosis present

## 2018-11-25 DIAGNOSIS — E039 Hypothyroidism, unspecified: Secondary | ICD-10-CM | POA: Diagnosis present

## 2018-11-25 DIAGNOSIS — Z79890 Hormone replacement therapy (postmenopausal): Secondary | ICD-10-CM

## 2018-11-25 DIAGNOSIS — J1282 Pneumonia due to coronavirus disease 2019: Secondary | ICD-10-CM | POA: Diagnosis present

## 2018-11-25 DIAGNOSIS — N400 Enlarged prostate without lower urinary tract symptoms: Secondary | ICD-10-CM | POA: Diagnosis present

## 2018-11-25 DIAGNOSIS — D469 Myelodysplastic syndrome, unspecified: Secondary | ICD-10-CM | POA: Diagnosis present

## 2018-11-25 DIAGNOSIS — F0390 Unspecified dementia without behavioral disturbance: Secondary | ICD-10-CM | POA: Diagnosis present

## 2018-11-25 DIAGNOSIS — U071 COVID-19: Secondary | ICD-10-CM | POA: Diagnosis present

## 2018-11-25 HISTORY — DX: Hypothyroidism, unspecified: E03.9

## 2018-11-25 HISTORY — DX: Unspecified dementia, unspecified severity, without behavioral disturbance, psychotic disturbance, mood disturbance, and anxiety: F03.90

## 2018-11-25 LAB — FERRITIN: Ferritin: 856 ng/mL — ABNORMAL HIGH (ref 24–336)

## 2018-11-25 LAB — CBC WITH DIFFERENTIAL/PLATELET
Abs Immature Granulocytes: 0.03 10*3/uL (ref 0.00–0.07)
Basophils Absolute: 0 10*3/uL (ref 0.0–0.1)
Basophils Relative: 0 %
Eosinophils Absolute: 0 10*3/uL (ref 0.0–0.5)
Eosinophils Relative: 0 %
HCT: 20.6 % — ABNORMAL LOW (ref 39.0–52.0)
Hemoglobin: 6.3 g/dL — CL (ref 13.0–17.0)
Immature Granulocytes: 2 %
Lymphocytes Relative: 17 %
Lymphs Abs: 0.3 10*3/uL — ABNORMAL LOW (ref 0.7–4.0)
MCH: 29.4 pg (ref 26.0–34.0)
MCHC: 30.6 g/dL (ref 30.0–36.0)
MCV: 96.3 fL (ref 80.0–100.0)
Monocytes Absolute: 0.1 10*3/uL (ref 0.1–1.0)
Monocytes Relative: 7 %
Neutro Abs: 1.3 10*3/uL — ABNORMAL LOW (ref 1.7–7.7)
Neutrophils Relative %: 74 %
Platelets: <5 10*3/uL — CL (ref 150–400)
RBC: 2.14 MIL/uL — ABNORMAL LOW (ref 4.22–5.81)
RDW: 22.2 % — ABNORMAL HIGH (ref 11.5–15.5)
WBC: 1.7 10*3/uL — ABNORMAL LOW (ref 4.0–10.5)
nRBC: 0 % (ref 0.0–0.2)

## 2018-11-25 LAB — PROCALCITONIN: Procalcitonin: 3.94 ng/mL

## 2018-11-25 LAB — HEPATIC FUNCTION PANEL
ALT: 15 U/L (ref 0–44)
AST: 37 U/L (ref 15–41)
Albumin: 3.9 g/dL (ref 3.5–5.0)
Alkaline Phosphatase: 42 U/L (ref 38–126)
Bilirubin, Direct: 0.2 mg/dL (ref 0.0–0.2)
Indirect Bilirubin: 1.1 mg/dL — ABNORMAL HIGH (ref 0.3–0.9)
Total Bilirubin: 1.3 mg/dL — ABNORMAL HIGH (ref 0.3–1.2)
Total Protein: 6.6 g/dL (ref 6.5–8.1)

## 2018-11-25 LAB — BASIC METABOLIC PANEL
Anion gap: 11 (ref 5–15)
BUN: 28 mg/dL — ABNORMAL HIGH (ref 8–23)
CO2: 19 mmol/L — ABNORMAL LOW (ref 22–32)
Calcium: 8.6 mg/dL — ABNORMAL LOW (ref 8.9–10.3)
Chloride: 104 mmol/L (ref 98–111)
Creatinine, Ser: 1.16 mg/dL (ref 0.61–1.24)
GFR calc Af Amer: >60 mL/min (ref >60)
GFR calc non Af Amer: 56 mL/min — ABNORMAL LOW (ref >60)
Glucose, Bld: 135 mg/dL — ABNORMAL HIGH (ref 70–99)
Potassium: 3.9 mmol/L (ref 3.5–5.1)
Sodium: 134 mmol/L — ABNORMAL LOW (ref 135–145)

## 2018-11-25 LAB — ABO/RH: ABO/RH(D): A POS

## 2018-11-25 LAB — FIBRINOGEN: Fibrinogen: 532 mg/dL — ABNORMAL HIGH (ref 210–475)

## 2018-11-25 LAB — LACTATE DEHYDROGENASE: LDH: 252 U/L — ABNORMAL HIGH (ref 98–192)

## 2018-11-25 LAB — D-DIMER, QUANTITATIVE: D-Dimer, Quant: 5.43 ug/mL-FEU — ABNORMAL HIGH (ref 0.00–0.50)

## 2018-11-25 LAB — TRIGLYCERIDES: Triglycerides: 69 mg/dL (ref <150)

## 2018-11-25 LAB — PREPARE RBC (CROSSMATCH)

## 2018-11-25 LAB — C-REACTIVE PROTEIN: CRP: 11.4 mg/dL — ABNORMAL HIGH (ref <1.0)

## 2018-11-25 LAB — LACTIC ACID, PLASMA
Lactic Acid, Venous: 2.1 mmol/L (ref 0.5–1.9)
Lactic Acid, Venous: 1 mmol/L (ref 0.5–1.9)

## 2018-11-25 MED ORDER — SERTRALINE HCL 50 MG PO TABS
100.0000 mg | ORAL_TABLET | Freq: Every day | ORAL | Status: DC
  Administered 2018-11-26 – 2018-11-29 (×5): 100 mg via ORAL
  Filled 2018-11-25 (×5): qty 2

## 2018-11-25 MED ORDER — ONDANSETRON HCL 4 MG PO TABS: 4.0000 mg | ORAL_TABLET | Freq: Four times a day (QID) | ORAL | Status: DC | PRN

## 2018-11-25 MED ORDER — VITAMIN D 25 MCG (1000 UNIT) PO TABS
1000.0000 [IU] | ORAL_TABLET | Freq: Every day | ORAL | Status: DC
  Administered 2018-11-26 – 2018-11-30 (×5): 1000 [IU] via ORAL
  Filled 2018-11-25 (×10): qty 1

## 2018-11-25 MED ORDER — ONDANSETRON HCL 4 MG/2ML IJ SOLN: 4.0000 mg | Freq: Four times a day (QID) | INTRAMUSCULAR | Status: DC | PRN

## 2018-11-25 MED ORDER — POLYETHYLENE GLYCOL 3350 17 G PO PACK: 17.0000 g | PACK | Freq: Every day | ORAL | Status: DC | PRN

## 2018-11-25 MED ORDER — LEVOTHYROXINE SODIUM 50 MCG PO TABS
100.0000 ug | ORAL_TABLET | Freq: Every day | ORAL | Status: DC
  Administered 2018-11-27 – 2018-11-30 (×4): 100 ug via ORAL
  Filled 2018-11-25 (×4): qty 2

## 2018-11-25 MED ORDER — ACETAMINOPHEN 325 MG PO TABS
650.0000 mg | ORAL_TABLET | Freq: Once | ORAL | Status: AC
  Administered 2018-11-25: 650 mg via ORAL
  Filled 2018-11-25: qty 2

## 2018-11-25 MED ORDER — SODIUM CHLORIDE 0.9 % IV SOLN
100.0000 mg | INTRAVENOUS | Status: AC
  Administered 2018-11-26 – 2018-11-29 (×4): 100 mg via INTRAVENOUS
  Filled 2018-11-25 (×5): qty 20

## 2018-11-25 MED ORDER — SODIUM CHLORIDE 0.9 % IV SOLN
500.0000 mg | Freq: Every day | INTRAVENOUS | Status: AC
  Administered 2018-11-25 – 2018-11-28 (×4): 500 mg via INTRAVENOUS
  Filled 2018-11-25 (×4): qty 500

## 2018-11-25 MED ORDER — TAMSULOSIN HCL 0.4 MG PO CAPS
0.4000 mg | ORAL_CAPSULE | Freq: Every day | ORAL | Status: DC
  Administered 2018-11-26 – 2018-11-29 (×5): 0.4 mg via ORAL
  Filled 2018-11-25 (×5): qty 1

## 2018-11-25 MED ORDER — METHYLPREDNISOLONE SODIUM SUCC 125 MG IJ SOLR
60.0000 mg | Freq: Two times a day (BID) | INTRAMUSCULAR | Status: DC
  Administered 2018-11-25 – 2018-11-29 (×8): 60 mg via INTRAVENOUS
  Filled 2018-11-25 (×8): qty 2

## 2018-11-25 MED ORDER — SODIUM CHLORIDE 0.9 % IV SOLN
1.0000 g | Freq: Every day | INTRAVENOUS | Status: AC
  Administered 2018-11-25 – 2018-11-29 (×5): 1 g via INTRAVENOUS
  Filled 2018-11-25 (×5): qty 10

## 2018-11-25 MED ORDER — DEXAMETHASONE SODIUM PHOSPHATE 10 MG/ML IJ SOLN: 8.0000 mg | Freq: Four times a day (QID) | INTRAMUSCULAR | Status: DC

## 2018-11-25 MED ORDER — ACETAMINOPHEN 325 MG PO TABS
650.0000 mg | ORAL_TABLET | Freq: Four times a day (QID) | ORAL | Status: DC | PRN
  Administered 2018-11-26: 650 mg via ORAL
  Filled 2018-11-25: qty 2

## 2018-11-25 MED ORDER — GUAIFENESIN-DM 100-10 MG/5ML PO SYRP
10.0000 mL | ORAL_SOLUTION | ORAL | Status: DC | PRN
  Administered 2018-11-26: 10 mL via ORAL
  Filled 2018-11-25 (×2): qty 10

## 2018-11-25 MED ORDER — DEXAMETHASONE SODIUM PHOSPHATE 10 MG/ML IJ SOLN: 8.0000 mg | INTRAMUSCULAR | Status: DC

## 2018-11-25 MED ORDER — SODIUM CHLORIDE 0.9% IV SOLUTION
Freq: Once | INTRAVENOUS | Status: AC
  Administered 2018-11-26: 01:00:00 via INTRAVENOUS

## 2018-11-25 MED ORDER — SODIUM CHLORIDE 0.9 % IV SOLN
200.0000 mg | Freq: Once | INTRAVENOUS | Status: AC
  Administered 2018-11-25: 200 mg via INTRAVENOUS
  Filled 2018-11-25: qty 40

## 2018-11-25 MED ORDER — DONEPEZIL HCL 10 MG PO TABS
10.0000 mg | ORAL_TABLET | Freq: Every day | ORAL | Status: DC
  Administered 2018-11-26 – 2018-11-29 (×5): 10 mg via ORAL
  Filled 2018-11-25: qty 2
  Filled 2018-11-25 (×4): qty 1

## 2018-11-25 MED ORDER — ATORVASTATIN CALCIUM 10 MG PO TABS
5.0000 mg | ORAL_TABLET | Freq: Every day | ORAL | Status: DC
  Administered 2018-11-26 – 2018-11-30 (×5): 5 mg via ORAL
  Filled 2018-11-25 (×2): qty 1
  Filled 2018-11-25: qty 0.5
  Filled 2018-11-25 (×3): qty 1

## 2018-11-25 MED ORDER — ALBUTEROL SULFATE HFA 108 (90 BASE) MCG/ACT IN AERS
2.0000 | INHALATION_SPRAY | Freq: Four times a day (QID) | RESPIRATORY_TRACT | Status: DC | PRN
  Filled 2018-11-25: qty 6.7

## 2018-11-25 NOTE — ED Notes (Signed)
Informed family know we would be out to get patient soon

## 2018-11-25 NOTE — ED Notes (Signed)
Paged MD regarding platelet expiring at midnight and RBC's infusing

## 2018-11-25 NOTE — ED Triage Notes (Signed)
Patient presented to ed with c/o headache, fever and was test positive for COVID 19. Patient have hx of alzheimers's dementia.

## 2018-11-25 NOTE — ED Notes (Signed)
Date and time results received: 11/25/18 4:36 PM  (use smartphrase ".now" to insert current time)  Test: hemoblobin:  Critical Value: 6.3  Name of Provider Notified: [facility]  Orders Received? Or Actions Taken?: notified MD

## 2018-11-25 NOTE — H&P (Signed)
History and Physical    Shaun Hobbs [id number] DOB: 08/20/1928 DOA: 11/25/2018  PCP: Patient, No Pcp Per  Patient coming from: Home.  I have personally briefly reviewed patient's old medical records available.   Chief Complaint: Cough, shortness of breath and fever.  Recent diagnosis of COVID-19.  HPI: Shaun Hobbs is a 85 y.o. male with medical history significant of dementia, chronic myelodysplastic syndrome transfusion dependent with chronic pancytopenia, hypothyroidism who presents to the emergency room with family due to fever, hypoxia, cough and shortness of breath for 2 days.  Patient has mild dementia, he only has limited complaints.  He states he is coughing.  He denies any particular symptoms.  Denies any chest pain.  Patient states that he is short of breath on walking and today he had difficulty walking.  Patient lives with his wife at home.  Son accompanied him who is elaborating history. According to the son, patient is very functional at baseline.  He walks without any support.  Both of his parents went to [facility] 3 weeks ago to spend some time in their vacation house, he started having some dry cough about a week ago, they came back home 2 days ago, on the way back started having low-grade temperature and worsening cough, went to local emergency room and COVID-19 test was done and was sent home.  He was recovering at home, today was noted to be weak and unable to walk, cough was worsening, he had a temperature, they measured his oxygen saturation 89% while attempting to ambulate and he became short of breath that prompted him to visit ER.  Has cough, denies any sputum production. ED Course: Blood pressure is stable.  Temperature 101.4, respiratory rate 21, 96% on room air, less than 91% on attempted ambulation, WBC 1.7, ANC 1.3, hemoglobin 6.3, platelets less than 5000, chest x-ray with right lower lobe infiltrate, CRP more than 10, procalcitonin more than 3, AST  ALT pending COVID-19 positive at outside emergency room, no physical documentation present COVID-19 pending in our ER.  Review of Systems: all systems are reviewed and pertinent positive as per HPI otherwise rest are negative.    Past Medical History:  Diagnosis Date  . Cancer ([place]) 2008   Myelodysplastic syndrome  . Dementia ([place])   . Hypothyroidism     History reviewed. No pertinent surgical history.   reports that he has never smoked. He has never used smokeless tobacco. He reports previous alcohol use. He reports that he does not use drugs.  Allergies  Allergen Reactions  . Sulfa Antibiotics Other (See Comments)    Patient reports: reaction in past  Patient reports: reaction in past     History reviewed. No pertinent family history.   Prior to Admission medications   Medication Sig Start Date End Date Taking? Authorizing Provider  atorvastatin (LIPITOR) 10 MG tablet Take 5 mg by mouth daily.   Yes [provider]  azithromycin (ZITHROMAX) 250 MG tablet Take 250-500 mg by mouth See admin instructions. As directed per package instructions.   Yes [provider]  Cholecalciferol (VITAMIN D3 PO) Take by mouth daily.   Yes [provider]  donepezil (ARICEPT) 10 MG tablet Take 10 mg by mouth at bedtime.   Yes [provider]  levothyroxine (SYNTHROID, LEVOTHROID) 100 MCG tablet Take 100 mcg by mouth daily before breakfast.   Yes [provider]  MAGNESIUM PO Take 1 tablet by mouth daily.    Yes [provider]  sertraline (ZOLOFT) 100 MG tablet Take 100 mg by mouth at bedtime.   Yes [provider]  tamsulosin (FLOMAX) 0.4 MG CAPS capsule Take 0.4 mg by mouth at bedtime.   Yes [provider]    Physical Exam: Vitals:   11/25/18 1645 11/25/18 1700 11/25/18 1715 11/25/18 1730  BP:  135/64  133/75  Pulse: 90 93 88 86  Resp: (!) 22 (!) 27 (!) 25 (!) 27  Temp:      TempSrc:      SpO2: 94% 96% 94% 94%     Constitutional: NAD, calm, comfortable Vitals:   11/25/18 1645 11/25/18 1700 11/25/18 1715 11/25/18 1730  BP:  135/64  133/75  Pulse: 90 93 88 86  Resp: (!) 22 (!) 27 (!) 25 (!) 27  Temp:      TempSrc:      SpO2: 94% 96% 94% 94%   Eyes: PERRL, lids and conjunctivae normal ENMT: Mucous membranes are dry . Posterior pharynx clear of any exudate or lesions.Normal dentition.  Neck: normal, supple, no masses, no thyromegaly Respiratory: clear to auscultation bilaterally, no wheezing, no crackles. Normal respiratory effort. No accessory muscle use.  Mostly clear.  Some conducted airway sounds. Cardiovascular: Regular rate and rhythm, no murmurs / rubs / gallops. No extremity edema. 2+ pedal pulses. No carotid bruits.  Venous pigmentation present.  No edema.  No tenderness. Abdomen: no tenderness, no masses palpated. No hepatosplenomegaly. Bowel sounds positive.  Musculoskeletal: no clubbing / cyanosis. No joint deformity upper and lower extremities. Good ROM, no contractures. Normal muscle tone.  Skin: no rashes, lesions, ulcers. No induration Neurologic: CN 2-12 grossly intact. Sensation intact, DTR normal. Strength 5/5 in all 4.  Psychiatric: Normal judgment and insight. Alert and awake.  Not very interactive.  Lethargic.    Labs on Admission: I have personally reviewed following labs and imaging studies  CBC: Recent Labs  Lab 11/25/18 1523  WBC 1.7*  NEUTROABS 1.3*  HGB 6.3*  HCT 20.6*  MCV 96.3  PLT <5*   Basic Metabolic Panel: Recent Labs  Lab 11/25/18 1523  NA 134*  K 3.9  CL 104  CO2 19*  GLUCOSE 135*  BUN 28*  CREATININE 1.16  CALCIUM 8.6*   GFR: CrCl cannot be calculated (Unknown ideal weight.). Liver Function Tests: Recent Labs  Lab 11/25/18 1522  AST 37  ALT 15  ALKPHOS 42  BILITOT 1.3*  PROT 6.6  ALBUMIN 3.9   No results for input(s): LIPASE, AMYLASE in the last 168 hours. No results for input(s): AMMONIA in the last 168 hours. Coagulation  Profile: No results for input(s): INR, PROTIME in the last 168 hours. Cardiac Enzymes: No results for input(s): CKTOTAL, CKMB, CKMBINDEX, TROPONINI in the last 168 hours. BNP (last 3 results) No results for input(s): PROBNP in the last 8760 hours. HbA1C: No results for input(s): HGBA1C in the last 72 hours. CBG: No results for input(s): GLUCAP in the last 168 hours. Lipid Profile: Recent Labs    11/25/18 1523  TRIG 69   Thyroid Function Tests: No results for input(s): TSH, T4TOTAL, FREET4, T3FREE, THYROIDAB in the last 72 hours. Anemia Panel: Recent Labs    11/25/18 1523  FERRITIN 856*   Urine analysis: No results found for: COLORURINE, APPEARANCEUR, LABSPEC, PHURINE, GLUCOSEU, HGBUR, BILIRUBINUR, KETONESUR, PROTEINUR, UROBILINOGEN, NITRITE, LEUKOCYTESUR  Radiological Exams on Admission: Dg Chest Portable 1 View  Result Date: 11/25/2018 CLINICAL DATA:  Headache, fever, COVID-19 EXAM: PORTABLE CHEST 1 VIEW COMPARISON:  None.  FINDINGS: Patchy right lower lobe opacity, suspicious for pneumonia. Mild left basilar opacity, possibly atelectasis. No pleural effusion or pneumothorax. Cardiomegaly. IMPRESSION: Right lower lobe pneumonia in this patient with known COVID. Electronically Signed   By: Julian Hy M.D.   On: 11/25/2018 16:19    EKG: Independently reviewed.  Normal sinus rhythm.  Low voltage EKG.  Anterior fascicular block present.  No previous EKG to compare.  Assessment/Plan Principal Problem:   Pneumonia due to COVID-19 virus Active Problems:   Myelodysplasia (myelodysplastic syndrome) (HCC)   Dementia without behavioral disturbance ([place])     1.  Pneumonia due to COVID-19 virus: Suspect superadded bacterial infection due to lactic acidosis, high procalcitonin and worsening symptoms. Agree with admission given severity of symptoms. Antibiotics to treat bacterial pneumonia due to severity of symptoms.  We will continue Rocephin and azithromycin.  Blood cultures  were drawn.  Send sputum cultures. Chest physiotherapy, incentive spirometry, deep breathing exercises, sputum induction, mucolytic's and bronchodilators. Supplemental oxygen to keep saturations more than 90%.  COVID-19 directed therapies: We will start patient on IV Solu-Medrol.  Pending LFTs, remdesivir will be given. Severe pancytopenia and thrombocytopenia, currently no indication for Actemra. Patient will need daily inflammatory markers and monitoring.  2.  Myelodysplastic syndrome with severe pancytopenia: Hemoglobin is 6.3, baseline hemoglobin about 7.  Will benefit with 1 unit PRBC transfusion.  Ordered. Platelets less than 5, no evidence of a spontaneous bleeding, will order 1 unit of platelets. Recheck levels in the morning.  3.  Dementia without behavioral disturbances: Patient on supportive treatment that he will continue.  He is on Zoloft and Aricept.  4.  Hypothyroidism: Clinically euthyroid.  Continue Synthroid as he takes at home.  Patient has severe systemic symptoms.  Anticipate hospitalizations more than 2 midnights.  DVT prophylaxis: SCDs.  Pharmacological agents contraindicated because of critically low platelets. Code Status: DNR/DNI.  Communicated with patient's son at the bedside.  Family Communication: Patient's son at the bedside.  Different modalities of treatment explained, consented for blood transfusion, consented for antiviral treatment. Disposition Plan: Admit to [facility]. Consults called: None Admission status: Inpatient.   Barb Merino MD [facility] Pager [phone]

## 2018-11-25 NOTE — ED Notes (Signed)
Date and time results received: 11/25/18 4:39 PM  (use smartphrase ".now" to insert current time)  Test: [place]  Critical Value: less than 5  Name of Provider Notified: Dykstra  Orders Received? Or Actions Taken?:

## 2018-11-25 NOTE — ED Notes (Signed)
Date and time results received: 11/25/18 4:38 PM  (use smartphrase ".now" to insert current time)  Test:Lactic Acid Critical Value: 2.1  Name of Provider Notified: Dykstra  Orders Received? Or Actions Taken?:

## 2018-11-25 NOTE — ED Provider Notes (Signed)
Shaun Hobbs   CSN: [id number] Arrival date & time: 11/25/18  1346     History   Chief Complaint No chief complaint on file.   HPI Shaun Hobbs is a 85 y.o. male.  We will 5 caveat history limited due to patient'Hobbs baseline dementia.  86 history obtained from patient'Hobbs son Shaun Hobbs. for past week patient has had cough, shortness of breath, difficulty with walking long distance; went to ER in [place] few days [place], tested for [place] and was positive.  Today, temp 101.8, oxygen 89-95% at home, increased shortness of breath.  When interviewing patient, he has apparent dementia, but answer basic yes no question. Denies any acute complaints.       HPI  No past medical history on file.  There are no active problems to display for this patient.    Home Medications    Prior to Admission medications   Medication Sig Start Date End Date Taking? Authorizing Provider  Atorvastatin Calcium (LIPITOR PO) Take 5 mg by mouth at bedtime.    [provider]  Cholecalciferol (VITAMIN D3 PO) Take by mouth daily.    [provider]  donepezil (ARICEPT) 10 MG tablet Take 10 mg by mouth at bedtime.    [provider]  levothyroxine (SYNTHROID, LEVOTHROID) 100 MCG tablet Take 100 mcg by mouth daily before breakfast.    [provider]  MAGNESIUM PO Take by mouth.    [provider]  sertraline (ZOLOFT) 100 MG tablet Take 100 mg by mouth at bedtime.    [provider]  tamsulosin (FLOMAX) 0.4 MG CAPS capsule Take 0.4 mg by mouth at bedtime.    [provider]    Family History No family history on file.  Social History Social History   Tobacco Use  . Smoking status: Not on file  Substance Use Topics  . Alcohol use: Not on file  . Drug use: Not on file     Allergies   Sulfa antibiotics   Review of Systems Review of Systems  Unable to  perform ROS: Dementia  Constitutional: Positive for fever.  Respiratory: Positive for cough and shortness of breath.      Physical Exam Updated Vital Signs BP (!) 143/81   Pulse 99   Temp (!) 101.4 F (38.6 C) (Oral)   Resp (!) 28   SpO2 96%   Physical Exam Vitals signs and nursing Hobbs reviewed.  Constitutional:      Appearance: He is well-developed.     Comments: No acute distress, pleasantly demented  HENT:     Head: Normocephalic and atraumatic.  Eyes:     Conjunctiva/sclera: Conjunctivae normal.  Neck:     Musculoskeletal: Neck supple.  Cardiovascular:     Rate and Rhythm: Normal rate and regular rhythm.     Heart sounds: No murmur.  Pulmonary:     Effort: Pulmonary effort is normal. No respiratory distress.     Breath sounds: Normal breath sounds.  Abdominal:     Palpations: Abdomen is soft.     Tenderness: There is no abdominal tenderness.  Skin:    General: Skin is warm and dry.  Neurological:     Mental Status: He is alert.     Comments: Alert, not oriented      ED Treatments / Results  Labs (all labs ordered are listed, but only abnormal results are displayed) Labs Reviewed  CULTURE, BLOOD (ROUTINE  X 2)  CULTURE, BLOOD (ROUTINE X 2)  SARS CORONAVIRUS 2 (TAT 6-24 HRS)  CBC WITH DIFFERENTIAL/PLATELET  BASIC METABOLIC PANEL  LACTIC ACID, PLASMA  LACTIC ACID, PLASMA  D-DIMER, QUANTITATIVE (NOT AT [facility])  PROCALCITONIN  LACTATE DEHYDROGENASE  FERRITIN  TRIGLYCERIDES  FIBRINOGEN  C-REACTIVE PROTEIN    EKG EKG Interpretation  Date/Time:  "Sunday November 25 2018 14:58:30 EDT Ventricular Rate:  101 PR Interval:    QRS Duration: 116 QT Interval:  356 QTC Calculation: 462 R Axis:   -65 Text Interpretation:  Sinus tachycardia with irregular rate Left anterior fascicular block Low voltage, precordial leads Consider anterior infarct Nonspecific T abnormalities, lateral leads No old tracing to compare Confirmed by Shaun Hobbs ([id number]) on 11/25/2018  3:01:12 PM   Radiology No results found.  Procedures .Critical Care Performed by: Shaun Magistro S, MD Authorized by: Shaun Sanagustin S, MD   Critical care provider statement:    Critical care time (minutes):  45   Critical care was necessary to treat or prevent imminent or life-threatening deterioration of the following conditions:  Respiratory failure (anemia, thrombocytopenia)   Critical care was time spent personally by me on the following activities:  Discussions with consultants, evaluation of patient'Hobbs response to treatment, examination of patient, ordering and performing treatments and interventions, ordering and review of laboratory studies, ordering and review of radiographic studies, pulse oximetry, re-evaluation of patient'Hobbs condition, obtaining history from patient or surrogate and review of old charts   (including critical care time)  Medications Ordered in ED Medications  acetaminophen (TYLENOL) tablet 650 mg (650 mg Oral Given 11/25/18 1528)     Initial Impression / Assessment and Plan / ED Course  I have reviewed the triage vital signs and the nursing notes.  Pertinent labs & imaging results that were available during my care of the patient were reviewed by me and considered in my medical decision making (see chart for details).  Clinical Course as of Nov 24 1800  Sun Nov 25, 2018  1517 Discussed with daughter, [phone] - Wit   [RD]  1520 Discussed with Wit - for past week patient has had cough, shortness of breath, difficulty with walking long distance; went to ER in [place], tested for COVID; today, temp 101.8, oxygen 89-95%.     [RD]  1710 Updated patient and son on results, plan for admission   [RD]  1717 Discussed case with hospitalist service who agrees to admission   [RD]  1722 Discussed with oncologist, Shaun Hobbs, agree with 1 unit PRBCs and 1 unit of platelets for now, goal hemoglobin greater than 7, goal platelets greater than 10   [RD]     Clinical Course User Index [RD] Shaun Ueda S, MD       89"  year old male past medical history most notable for bile dysplastic syndrome, recurrent anemia and thrombocytopenia presents to ER with shortness of breath, known Covid diagnosis.  Patient with borderline low oxygen saturations, not requiring supplemental oxygen on at rest though.  Patient lab work concerning for significant anemia, thrombocytopenia.  Likely related to MDS.  Will admit to hospital service for further management.  Discussed with oncology as well.  Will transfuse 1 unit PRBCs and 1 unit platelets.  Dr. Tera Helper accepting.   Final Clinical Impressions(Hobbs) / ED Diagnoses   Final diagnoses:  COVID-19  Anemia, unspecified type  Thrombocytopenia (HCC)  MDS (myelodysplastic syndrome) [facility])    ED Discharge Orders    None       Esperanza Madrazo, Ellwood Dense, MD  11/25/18 1806  

## 2018-11-25 NOTE — ED Notes (Signed)
Pt provided sandwich apple sauce and water.

## 2018-11-26 ENCOUNTER — Telehealth: Payer: Self-pay | Admitting: *Deleted

## 2018-11-26 DIAGNOSIS — F0390 Unspecified dementia without behavioral disturbance: Secondary | ICD-10-CM

## 2018-11-26 DIAGNOSIS — D469 Myelodysplastic syndrome, unspecified: Secondary | ICD-10-CM

## 2018-11-26 LAB — COMPREHENSIVE METABOLIC PANEL
ALT: 18 U/L (ref 0–44)
AST: 41 U/L (ref 15–41)
Albumin: 3.4 g/dL — ABNORMAL LOW (ref 3.5–5.0)
Alkaline Phosphatase: 39 U/L (ref 38–126)
Anion gap: 8 (ref 5–15)
BUN: 25 mg/dL — ABNORMAL HIGH (ref 8–23)
CO2: 22 mmol/L (ref 22–32)
Calcium: 8.4 mg/dL — ABNORMAL LOW (ref 8.9–10.3)
Chloride: 105 mmol/L (ref 98–111)
Creatinine, Ser: 0.82 mg/dL (ref 0.61–1.24)
GFR calc Af Amer: >60 mL/min (ref >60)
GFR calc non Af Amer: >60 mL/min (ref >60)
Glucose, Bld: 162 mg/dL — ABNORMAL HIGH (ref 70–99)
Potassium: 3.6 mmol/L (ref 3.5–5.1)
Sodium: 135 mmol/L (ref 135–145)
Total Bilirubin: 0.6 mg/dL (ref 0.3–1.2)
Total Protein: 6.3 g/dL — ABNORMAL LOW (ref 6.5–8.1)

## 2018-11-26 LAB — C-REACTIVE PROTEIN: CRP: 16.3 mg/dL — ABNORMAL HIGH (ref <1.0)

## 2018-11-26 LAB — TYPE AND SCREEN
ABO/RH(D): A POS
Antibody Screen: NEGATIVE
Unit division: 0

## 2018-11-26 LAB — CBC WITH DIFFERENTIAL/PLATELET
Abs Immature Granulocytes: 0.03 10*3/uL (ref 0.00–0.07)
Basophils Absolute: 0 10*3/uL (ref 0.0–0.1)
Basophils Relative: 0 %
Eosinophils Absolute: 0 10*3/uL (ref 0.0–0.5)
Eosinophils Relative: 0 %
HCT: 25.4 % — ABNORMAL LOW (ref 39.0–52.0)
Hemoglobin: 7.9 g/dL — ABNORMAL LOW (ref 13.0–17.0)
Immature Granulocytes: 2 %
Lymphocytes Relative: 17 %
Lymphs Abs: 0.3 10*3/uL — ABNORMAL LOW (ref 0.7–4.0)
MCH: 29.6 pg (ref 26.0–34.0)
MCHC: 31.1 g/dL (ref 30.0–36.0)
MCV: 95.1 fL (ref 80.0–100.0)
Monocytes Absolute: 0.1 10*3/uL (ref 0.1–1.0)
Monocytes Relative: 4 %
Neutro Abs: 1.2 10*3/uL — ABNORMAL LOW (ref 1.7–7.7)
Neutrophils Relative %: 77 %
Platelets: 17 10*3/uL — CL (ref 150–400)
RBC: 2.67 MIL/uL — ABNORMAL LOW (ref 4.22–5.81)
RDW: 20.9 % — ABNORMAL HIGH (ref 11.5–15.5)
WBC: 1.6 10*3/uL — ABNORMAL LOW (ref 4.0–10.5)
nRBC: 0 % (ref 0.0–0.2)

## 2018-11-26 LAB — BPAM RBC
Blood Product Expiration Date: 202011112359
ISSUE DATE / TIME: 202010182155
Unit Type and Rh: 6200

## 2018-11-26 LAB — D-DIMER, QUANTITATIVE: D-Dimer, Quant: 6.45 ug/mL-FEU — ABNORMAL HIGH (ref 0.00–0.50)

## 2018-11-26 LAB — SARS CORONAVIRUS 2 (TAT 6-24 HRS): SARS Coronavirus 2: POSITIVE — AB

## 2018-11-26 LAB — PHOSPHORUS: Phosphorus: 3.2 mg/dL (ref 2.5–4.6)

## 2018-11-26 LAB — MAGNESIUM: Magnesium: 1.9 mg/dL (ref 1.7–2.4)

## 2018-11-26 LAB — FERRITIN: Ferritin: 1444 ng/mL — ABNORMAL HIGH (ref 24–336)

## 2018-11-26 NOTE — Plan of Care (Signed)
Patient is confused at baseline. Problem: Education: Goal: Knowledge of risk factors and measures for prevention of condition will improve Outcome: Not Progressing   Problem: Education: Goal: Knowledge of General Education information will improve Description: Including pain rating scale, medication(s)/side effects and non-pharmacologic comfort measures Outcome: Not Progressing

## 2018-11-26 NOTE — Progress Notes (Signed)
PROGRESS NOTE                                                                                                                                                                                                             Patient Demographics:    Shaun Hobbs, is a 85 y.o. male, DOB - 1928/10/21, [id number]  Outpatient Primary MD for the patient is Patient, No Pcp Per   Admit date - 11/25/2018   LOS - 1  No chief complaint on file.      Brief Narrative: Patient is a 85 y.o. male with PMHx of dementia, transfusion dependent MDS, HLD, hypothyroidism, BPH presented to the ED on 10/18 with cough, shortness of breath and fever-he was recently diagnosed with COVID-19 a few days prior to this presentation.  Upon further evaluation he was found to have  COVID-19 viral pneumonia.  See below for further details   Subjective:    Shaun Hobbs today remains pleasantly confused-he is on room air.   Assessment  & Plan :   Covid 19 Viral pneumonia with superimposed bacterial pneumonia: On room air-afebrile-does not appear toxic-but at risk of severe disease due to significant medical comorbidities.  Plans are to continue with steroids Remdesivir for Covid 19 viral pneumonia, and Rocephin/Zithromax for presumed bacterial pneumonia.  Blood cultures are negative so far.  Fever: afebrile  O2 requirements: On RA  COVID-19 Labs: Recent Labs    11/25/18 1522 11/25/18 1523 11/26/18 0539  DDIMER 5.43*  --  6.45*  FERRITIN  --  856* 1,444*  LDH 252*  --   --   CRP  --  11.4* 16.3*    No results found for: SARSCOV2NAA   COVID-19 Medications: Steroids: 10/18>> Remdesivir: 10/18 Actemra: Not a candidate given severe thrombocytopenia at baseline Convalescent Plasma: Not indicated as stable disease-can consider if patient worsens. Research Studies:N/A  Other medications: Diuretics:Euvolemic-no need for lasix Antibiotics:10/18>>  Rocephin/Zithromax  Prone/Incentive Spirometry: encouraged incentive spirometry use 3-4/hour.  DVT Prophylaxis  : SCDs due to severe thrombocytopenia.  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  Follow CBC.  Reviewed outpatient hematology note-appears patient gets transfusion every 2 weeks.  Hypothyroidism: Continue Synthroid  Dementia: Pleasantly confused-at risk of delirium during this hospital stay.  Continue Zoloft and  Aricept.  BPH: Continue with Flomax  ABG: No results found for: PHART, PCO2ART, PO2ART, HCO3, TCO2, ACIDBASEDEF, O2SAT  Vent Settings: N/A  Condition - Stable-but at risk for severe disease.  Family Communication  :  Son and spouse updated over the phone  Code Status : DNR  Diet :  Diet Order            Diet regular Room service appropriate? Yes; Fluid consistency: Thin  Diet effective now               Disposition Plan  :  Remain hospitalized  Barriers to discharge:   Consults  :  None  Procedures  :  None  Antibiotics  :    Anti-infectives (From admission, onward)   Start     Dose/Rate Route Frequency Ordered Stop   11/26/18 1845  remdesivir 100 mg in sodium chloride 0.9 % 250 mL IVPB     100 mg 500 mL/hr over 30 Minutes Intravenous Every 24 hours 11/25/18 1843 11/30/18 1844   11/25/18 1845  remdesivir 200 mg in sodium chloride 0.9 % 250 mL IVPB     200 mg 500 mL/hr over 30 Minutes Intravenous  Once 11/25/18 1843 11/25/18 2048   11/25/18 1830  cefTRIAXone (ROCEPHIN) 1 g in sodium chloride 0.9 % 100 mL IVPB     1 g 200 mL/hr over 30 Minutes Intravenous Daily-1800 11/25/18 1800     11/25/18 1830  azithromycin (ZITHROMAX) 500 mg in sodium chloride 0.9 % 250 mL IVPB     500 mg 250 mL/hr over 60 Minutes Intravenous Daily-1800 11/25/18 1811        Inpatient Medications  Scheduled Meds: . atorvastatin  5 mg Oral Daily  . cholecalciferol   1,000 Units Oral Daily  . donepezil  10 mg Oral QHS  . [START ON 11/27/2018] levothyroxine  100 mcg Oral QAC breakfast  . methylPREDNISolone (SOLU-MEDROL) injection  60 mg Intravenous BID  . sertraline  100 mg Oral QHS  . tamsulosin  0.4 mg Oral QHS   Continuous Infusions: . azithromycin Stopped (11/25/18 2150)  . cefTRIAXone (ROCEPHIN)  IV Stopped (11/25/18 2040)  . remdesivir 100 mg in NS 250 mL     PRN Meds:.acetaminophen, albuterol, guaiFENesin-dextromethorphan, ondansetron **OR** ondansetron (ZOFRAN) IV, polyethylene glycol   Time Spent in minutes  25  See all Orders from today for further details  Oren Binet M.D on 11/26/2018 at 1:46 PM  To page go to [email or web address] - use universal password  [facility] -  Office  [phone]    Objective:   Vitals:   11/26/18 1000 11/26/18 1030 11/26/18 1100 11/26/18 1236  BP: 119/64 117/68 116/66 126/65  Pulse: 78 94 65 67  Resp: 20  20 15   Temp: 98.2 F (36.8 C)   98.1 F (36.7 C)  TempSrc: Oral   Oral  SpO2: 95% 96% 95% 97%  Weight:    72.9 kg  Height:    5' 10.5" (1.791 m)    Wt Readings from Last 3 Encounters:  11/26/18 72.9 kg  10/16/18 75.6 kg  08/20/18 75.8 kg     Intake/Output Summary (Last 24 hours) at 11/26/2018 1346 Last data filed at 11/26/2018 0536 Gross per 24 hour  Intake 204 ml  Output -  Net 204 ml     Physical Exam Gen Exam:-Awake but pleasantly confused.   HEENT:atraumatic, normocephalic Chest: B/L clear to auscultation anteriorly CVS:S1S2 regular Abdomen:soft non tender, non distended Extremities:no edema Neurology: Non focal Skin: no  rash   Data Review:    CBC Recent Labs  Lab 11/25/18 1523 11/26/18 0539  WBC 1.7* 1.6*  HGB 6.3* 7.9*  HCT 20.6* 25.4*  PLT <5* 17*  MCV 96.3 95.1  MCH 29.4 29.6  MCHC 30.6 31.1  RDW 22.2* 20.9*  LYMPHSABS 0.3* 0.3*  MONOABS 0.1 0.1  EOSABS 0.0 0.0  BASOSABS 0.0 0.0    Chemistries  Recent Labs  Lab 11/25/18 1522 11/25/18  1523 11/26/18 0539  NA  --  134* 135  K  --  3.9 3.6  CL  --  104 105  CO2  --  19* 22  GLUCOSE  --  135* 162*  BUN  --  28* 25*  CREATININE  --  1.16 0.82  CALCIUM  --  8.6* 8.4*  MG  --   --  1.9  AST 37  --  41  ALT 15  --  18  ALKPHOS 42  --  39  BILITOT 1.3*  --  0.6   ------------------------------------------------------------------------------------------------------------------ Recent Labs    11/25/18 1523  TRIG 69    No results found for: HGBA1C ------------------------------------------------------------------------------------------------------------------ No results for input(s): TSH, T4TOTAL, T3FREE, THYROIDAB in the last 72 hours.  Invalid input(s): FREET3 ------------------------------------------------------------------------------------------------------------------ Recent Labs    11/25/18 1523 11/26/18 0539  FERRITIN 856* 1,444*    Coagulation profile No results for input(s): INR, PROTIME in the last 168 hours.  Recent Labs    11/25/18 1522 11/26/18 0539  DDIMER 5.43* 6.45*    Cardiac Enzymes No results for input(s): CKMB, TROPONINI, MYOGLOBIN in the last 168 hours.  Invalid input(s): CK ------------------------------------------------------------------------------------------------------------------ No results found for: BNP  Micro Results Recent Results (from the past 240 hour(s))  Blood Culture (routine x 2)     Status: None (Preliminary result)   Collection Time: 11/25/18  3:23 PM   Specimen: BLOOD  Result Value Ref Range Status   Specimen Description   Final    BLOOD WRIST RIGHT Performed at [facility], [place] [address] [place], [place] [address]    Special Requests   Final    BOTTLES DRAWN AEROBIC AND ANAEROBIC Blood Culture results may not be optimal due to an excessive volume of blood received in culture bottles Performed at [facility] [address] [place], [place] [address]     Culture   Final    NO GROWTH < 12 HOURS Performed at [facility] [address] [place], [place] [address]    Report Status PENDING  Incomplete  Blood Culture (routine x 2)     Status: None (Preliminary result)   Collection Time: 11/25/18  3:23 PM   Specimen: BLOOD  Result Value Ref Range Status   Specimen Description   Final    BLOOD WRIST LEFT Performed at [facility] [address], [place], [place] [address]    Special Requests   Final    BOTTLES DRAWN AEROBIC AND ANAEROBIC Blood Culture adequate volume Performed at [facility] [address], [place], [place] [address]    Culture   Final    NO GROWTH < 12 HOURS Performed at [facility] [address] [place], [place] [address]    Report Status PENDING  Incomplete    Radiology Reports Dg Chest Portable 1 View  Result Date: 11/25/2018 CLINICAL DATA:  Headache, fever, COVID-19 EXAM: PORTABLE CHEST 1 VIEW COMPARISON:  None. FINDINGS: Patchy right lower lobe opacity, suspicious for pneumonia.  Mild left basilar opacity, possibly atelectasis. No pleural effusion or pneumothorax. Cardiomegaly. IMPRESSION: Right lower lobe pneumonia in this patient with known COVID. Electronically Signed   By: Julian Hy M.D.   On: 11/25/2018 16:19

## 2018-11-26 NOTE — Telephone Encounter (Signed)
Received faxed Telephone Advice fax from [facility]. Son called 10/18, said father had temp over 87, was short of breath and was concerned after father COVID+ this weekend. Was advised to go to ED. Contacted Ms. Bordner - Patient now at [facility] ED - tested positive for COVID 19 on 11/23/2018 at hospital in [place]. She does not feel well and was tested this morning. She asked about pt appt at [facility] on 10/20 for lab/Blood. Patient  received blood while in ED. Ms. Lahner will update on patient condition.  Per Dr. Irene Limbo, cancel tomorrow's appt.

## 2018-11-26 NOTE — Progress Notes (Addendum)
1258: call to son Whit. Updated on patient arrival. All questions answered.  Son states patient pulls at IVs at times and tries to get out of bed alone. States patient is weak but normally able to walk at home without assistance.  1840: Call from son. Updated on patient condition.  Call to wife for video chat, no answer.  1910: Facetime to wife Dub Mikes for patient.

## 2018-11-26 NOTE — ED Notes (Signed)
Pt provided with breakfast tray, no other needs expressed at this time. 

## 2018-11-26 NOTE — ED Notes (Signed)
Called  Lab to ask about platelets. They advised they will call when they are ready.

## 2018-11-26 NOTE — Progress Notes (Signed)
Patient seen and examined at bedside. Patient stable for transfer to [facility]. Doing well. Please see progress note from this afternoon for further plan details.  Cordelia Poche, MD [facility] 11/26/2018, 2:16 PM

## 2018-11-26 NOTE — ED Notes (Signed)
Assisted Pt to stand at bedside to use urinal. Pt able to stand without assistance. Assistance Pt back to bed. Provided assistance turing on tv. Pt provided water. No other needs at this time.

## 2018-11-27 ENCOUNTER — Other Ambulatory Visit: Payer: Medicare Other

## 2018-11-27 LAB — PREPARE PLATELET PHERESIS
Unit division: 0
Unit division: 0

## 2018-11-27 LAB — C-REACTIVE PROTEIN: CRP: 8.6 mg/dL — ABNORMAL HIGH (ref <1.0)

## 2018-11-27 LAB — CBC WITH DIFFERENTIAL/PLATELET
Abs Immature Granulocytes: 0.05 10*3/uL (ref 0.00–0.07)
Basophils Absolute: 0 10*3/uL (ref 0.0–0.1)
Basophils Relative: 0 %
Eosinophils Absolute: 0 10*3/uL (ref 0.0–0.5)
Eosinophils Relative: 0 %
HCT: 24.6 % — ABNORMAL LOW (ref 39.0–52.0)
Hemoglobin: 7.7 g/dL — ABNORMAL LOW (ref 13.0–17.0)
Immature Granulocytes: 2 %
Lymphocytes Relative: 12 %
Lymphs Abs: 0.3 10*3/uL — ABNORMAL LOW (ref 0.7–4.0)
MCH: 29.5 pg (ref 26.0–34.0)
MCHC: 31.3 g/dL (ref 30.0–36.0)
MCV: 94.3 fL (ref 80.0–100.0)
Monocytes Absolute: 0.1 10*3/uL (ref 0.1–1.0)
Monocytes Relative: 4 %
Neutro Abs: 1.8 10*3/uL (ref 1.7–7.7)
Neutrophils Relative %: 82 %
Platelets: 15 10*3/uL — CL (ref 150–400)
RBC: 2.61 MIL/uL — ABNORMAL LOW (ref 4.22–5.81)
RDW: 21.5 % — ABNORMAL HIGH (ref 11.5–15.5)
WBC: 2.1 10*3/uL — ABNORMAL LOW (ref 4.0–10.5)
nRBC: 1.4 % — ABNORMAL HIGH (ref 0.0–0.2)

## 2018-11-27 LAB — COMPREHENSIVE METABOLIC PANEL
ALT: 23 U/L (ref 0–44)
AST: 43 U/L — ABNORMAL HIGH (ref 15–41)
Albumin: 3 g/dL — ABNORMAL LOW (ref 3.5–5.0)
Alkaline Phosphatase: 32 U/L — ABNORMAL LOW (ref 38–126)
Anion gap: 10 (ref 5–15)
BUN: 36 mg/dL — ABNORMAL HIGH (ref 8–23)
CO2: 22 mmol/L (ref 22–32)
Calcium: 8.1 mg/dL — ABNORMAL LOW (ref 8.9–10.3)
Chloride: 105 mmol/L (ref 98–111)
Creatinine, Ser: 0.93 mg/dL (ref 0.61–1.24)
GFR calc Af Amer: >60 mL/min (ref >60)
GFR calc non Af Amer: >60 mL/min (ref >60)
Glucose, Bld: 169 mg/dL — ABNORMAL HIGH (ref 70–99)
Potassium: 3.8 mmol/L (ref 3.5–5.1)
Sodium: 137 mmol/L (ref 135–145)
Total Bilirubin: 0.7 mg/dL (ref 0.3–1.2)
Total Protein: 5.5 g/dL — ABNORMAL LOW (ref 6.5–8.1)

## 2018-11-27 LAB — BPAM PLATELET PHERESIS
Blood Product Expiration Date: 202010182359
Blood Product Expiration Date: 202010202359
ISSUE DATE / TIME: 202010190314
Unit Type and Rh: 5100
Unit Type and Rh: 8400

## 2018-11-27 LAB — FERRITIN: Ferritin: 1756 ng/mL — ABNORMAL HIGH (ref 24–336)

## 2018-11-27 LAB — PHOSPHORUS: Phosphorus: 3.9 mg/dL (ref 2.5–4.6)

## 2018-11-27 LAB — MAGNESIUM: Magnesium: 1.9 mg/dL (ref 1.7–2.4)

## 2018-11-27 LAB — D-DIMER, QUANTITATIVE: D-Dimer, Quant: 3.63 ug/mL-FEU — ABNORMAL HIGH (ref 0.00–0.50)

## 2018-11-27 MED ORDER — MAGNESIUM SULFATE 2 GM/50ML IV SOLN
2.0000 g | Freq: Once | INTRAVENOUS | Status: AC
  Administered 2018-11-27: 2 g via INTRAVENOUS
  Filled 2018-11-27: qty 50

## 2018-11-27 NOTE — Progress Notes (Addendum)
PROGRESS NOTE                                                                                                                                                                                                             Patient Demographics:    Shaun Hobbs, is a 85 y.o. male, DOB - July 26, 1928, [id number]  Outpatient Primary MD for the patient is Patient, No Pcp Per   Admit date - 11/25/2018   LOS - 2  No chief complaint on file.      Brief Narrative: Patient is a 85 y.o. male with PMHx of dementia, transfusion dependent MDS, HLD, hypothyroidism, BPH presented to the ED on 10/18 with cough, shortness of breath and fever-he was recently diagnosed with COVID-19 a few days prior to this presentation.  Upon further evaluation he was found to have  COVID-19 viral pneumonia.  See below for further details   Subjective:    Shaun Hobbs today remains pleasantly confused-not on O2.    Assessment  & Plan :   Covid 19 Viral pneumonia with superimposed bacterial pneumonia: Improving-stable on room air. Continue Rocephin/Zithro along Remdesivir/Steroids. Blood cultures neg so far.  Fever: afebrile  O2 requirements: On RA  COVID-19 Labs: Recent Labs    11/25/18 1522 11/25/18 1523 11/26/18 0539 11/27/18 0500  DDIMER 5.43*  --  6.45* 3.63*  FERRITIN  --  856* 1,444* 1,756*  LDH 252*  --   --   --   CRP  --  11.4* 16.3* 8.6*    Lab Results  Component Value Date   SARSCOV2NAA POSITIVE (A) 11/25/2018     COVID-19 Medications: Steroids: 10/18>> Remdesivir: 10/18 Actemra: Not a candidate given severe thrombocytopenia at baseline Convalescent Plasma: Not indicated as stable disease-can consider if patient worsens. Research Studies:N/A  Other medications: Diuretics:Euvolemic-no need for lasix Antibiotics:10/18>> Rocephin/Zithromax  Prone/Incentive Spirometry: encouraged incentive spirometry use 3-4/hour.  DVT  Prophylaxis  : SCDs due to severe thrombocytopenia.  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  Counts are slightly better than usual baseline. Follow CBC.   Reviewed outpatient hematology note-appears patient gets transfusion every 2 weeks.  Hypothyroidism: Continue Synthroid  Dementia: Pleasantly confused-at risk of delirium during this hospital stay.  Continue Zoloft and Aricept.  BPH: Continue with  Flomax  ABG: No results found for: PHART, PCO2ART, PO2ART, HCO3, TCO2, ACIDBASEDEF, O2SAT  Vent Settings: N/A  Condition - Stable-but at risk for severe disease.  Family Communication  : Spoke with son over the phone today. Code Status : DNR  Diet :  Diet Order            Diet regular Room service appropriate? Yes; Fluid consistency: Thin  Diet effective now               Disposition Plan  :  Remain hospitalized  Barriers to discharge: Needs to continue with IV Abx/steroids/remdesivir for 5 days total  Consults  :  None  Procedures  :  None  Antibiotics  :    Anti-infectives (From admission, onward)   Start     Dose/Rate Route Frequency Ordered Stop   11/26/18 1600  remdesivir 100 mg in sodium chloride 0.9 % 250 mL IVPB     100 mg 500 mL/hr over 30 Minutes Intravenous Every 24 hours 11/25/18 1843 11/30/18 1559   11/25/18 1845  remdesivir 200 mg in sodium chloride 0.9 % 250 mL IVPB     200 mg 500 mL/hr over 30 Minutes Intravenous  Once 11/25/18 1843 11/25/18 2048   11/25/18 1830  cefTRIAXone (ROCEPHIN) 1 g in sodium chloride 0.9 % 100 mL IVPB     1 g 200 mL/hr over 30 Minutes Intravenous Daily-1800 11/25/18 1800     11/25/18 1830  azithromycin (ZITHROMAX) 500 mg in sodium chloride 0.9 % 250 mL IVPB     500 mg 250 mL/hr over 60 Minutes Intravenous Daily-1800 11/25/18 1811        Inpatient Medications  Scheduled Meds: . atorvastatin  5 mg Oral Daily  .  cholecalciferol  1,000 Units Oral Daily  . donepezil  10 mg Oral QHS  . levothyroxine  100 mcg Oral QAC breakfast  . methylPREDNISolone (SOLU-MEDROL) injection  60 mg Intravenous BID  . sertraline  100 mg Oral QHS  . tamsulosin  0.4 mg Oral QHS   Continuous Infusions: . azithromycin 500 mg (11/26/18 1833)  . cefTRIAXone (ROCEPHIN)  IV Stopped (11/26/18 1808)  . remdesivir 100 mg in NS 250 mL Stopped (11/26/18 1735)   PRN Meds:.acetaminophen, albuterol, guaiFENesin-dextromethorphan, ondansetron **OR** ondansetron (ZOFRAN) IV, polyethylene glycol   Time Spent in minutes  25  See all Orders from today for further details  Oren Binet M.D on 11/27/2018 at 10:46 AM  To page go to [email or web address] - use universal password  [facility] -  Office  [phone]    Objective:   Vitals:   11/26/18 1543 11/26/18 2008 11/27/18 0335 11/27/18 0745  BP: (!) 108/58 (!) 124/59 119/80 (!) 105/56  Pulse: 72 74 75 68  Resp: (!) 21 20 (!) 22 20  Temp: 98.7 F (37.1 C) 98.2 F (36.8 C) 97.6 F (36.4 C) (!) 97.4 F (36.3 C)  TempSrc: Oral Oral Axillary Oral  SpO2: 96% 97% 97% 96%  Weight:      Height:        Wt Readings from Last 3 Encounters:  11/26/18 72.9 kg  10/16/18 75.6 kg  08/20/18 75.8 kg     Intake/Output Summary (Last 24 hours) at 11/27/2018 1046 Last data filed at 11/27/2018 0900 Gross per 24 hour  Intake 1332.06 ml  Output 550 ml  Net 782.06 ml     Physical Exam Gen Exam:pleasantly confused-not in any distress HEENT:atraumatic, normocephalic Chest: B/L clear to auscultation anteriorly CVS:S1S2 regular Abdomen:soft non tender,  non distended Extremities:no edema Neurology: Non focal Skin: no rash   Data Review:    CBC Recent Labs  Lab 11/25/18 1523 11/26/18 0539 11/27/18 0500  WBC 1.7* 1.6* 2.1*  HGB 6.3* 7.9* 7.7*  HCT 20.6* 25.4* 24.6*  PLT <5* 17* 15*  MCV 96.3 95.1 94.3  MCH 29.4 29.6 29.5  MCHC 30.6 31.1 31.3  RDW 22.2* 20.9* 21.5*   LYMPHSABS 0.3* 0.3* 0.3*  MONOABS 0.1 0.1 0.1  EOSABS 0.0 0.0 0.0  BASOSABS 0.0 0.0 0.0    Chemistries  Recent Labs  Lab 11/25/18 1522 11/25/18 1523 11/26/18 0539 11/27/18 0500  NA  --  134* 135 137  K  --  3.9 3.6 3.8  CL  --  104 105 105  CO2  --  19* 22 22  GLUCOSE  --  135* 162* 169*  BUN  --  28* 25* 36*  CREATININE  --  1.16 0.82 0.93  CALCIUM  --  8.6* 8.4* 8.1*  MG  --   --  1.9 1.9  AST 37  --  41 43*  ALT 15  --  18 23  ALKPHOS 42  --  39 32*  BILITOT 1.3*  --  0.6 0.7   ------------------------------------------------------------------------------------------------------------------ Recent Labs    11/25/18 1523  TRIG 69    No results found for: HGBA1C ------------------------------------------------------------------------------------------------------------------ No results for input(s): TSH, T4TOTAL, T3FREE, THYROIDAB in the last 72 hours.  Invalid input(s): FREET3 ------------------------------------------------------------------------------------------------------------------ Recent Labs    11/26/18 0539 11/27/18 0500  FERRITIN 1,444* 1,756*    Coagulation profile No results for input(s): INR, PROTIME in the last 168 hours.  Recent Labs    11/26/18 0539 11/27/18 0500  DDIMER 6.45* 3.63*    Cardiac Enzymes No results for input(s): CKMB, TROPONINI, MYOGLOBIN in the last 168 hours.  Invalid input(s): CK ------------------------------------------------------------------------------------------------------------------ No results found for: BNP  Micro Results Recent Results (from the past 240 hour(s))  SARS CORONAVIRUS 2 (TAT 6-24 HRS) Nasopharyngeal Nasopharyngeal Swab     Status: Abnormal   Collection Time: 11/25/18  3:16 PM   Specimen: Nasopharyngeal Swab  Result Value Ref Range Status   SARS Coronavirus 2 POSITIVE (A) NEGATIVE Final    Comment: RESULT CALLED TO, READ BACK BY AND VERIFIED WITH: L.WHITE RN [id number] 11/26/2018 MCCORMICK K  (NOTE) SARS-CoV-2 target nucleic acids are DETECTED. The SARS-CoV-2 RNA is generally detectable in upper and lower respiratory specimens during the acute phase of infection. Positive results are indicative of active infection with SARS-CoV-2. Clinical  correlation with patient history and other diagnostic information is necessary to determine patient infection status. Positive results do  not rule out bacterial infection or co-infection with other viruses. The expected result is Negative. Fact Sheet for Patients: SugarRoll.be Fact Sheet for Healthcare Providers: https://www.woods-mathews.com/ This test is not yet approved or cleared by the [place] FDA and  has been authorized for detection and/or diagnosis of SARS-CoV-2 by FDA under an Emergency Use Authorization (EUA). This EUA will remain  in effect (meaning this test can be used) fo r the duration of the COVID-19 declaration under Section 564(b)(1) of the Act, 21 U.S.C. section 360bbb-3(b)(1), unless the authorization is terminated or revoked sooner. Performed at [facility], [place] [address] [place], [place] [address]   Blood Culture (routine x 2)     Status: None (Preliminary result)   Collection Time: 11/25/18  3:23 PM   Specimen: BLOOD  Result Value Ref Range Status   Specimen Description  Final    BLOOD WRIST RIGHT Performed at [facility], [place] [address] [place], [place] [address]    Special Requests   Final    BOTTLES DRAWN AEROBIC AND ANAEROBIC Blood Culture results may not be optimal due to an excessive volume of blood received in culture bottles Performed at [facility] [address] [place], [place] [address]    Culture   Final    NO GROWTH 2 DAYS Performed at [facility] [address] [place], [place] [address]    Report Status PENDING  Incomplete  Blood Culture (routine x 2)     Status: None (Preliminary  result)   Collection Time: 11/25/18  3:23 PM   Specimen: BLOOD  Result Value Ref Range Status   Specimen Description   Final    BLOOD WRIST LEFT Performed at [facility] [address], [place], [place] [address]    Special Requests   Final    BOTTLES DRAWN AEROBIC AND ANAEROBIC Blood Culture adequate volume Performed at [facility] [address] [place], [place] [address]    Culture   Final    NO GROWTH 2 DAYS Performed at [facility] [address], [place], [place] [address]    Report Status PENDING  Incomplete    Radiology Reports Dg Chest Portable 1 View  Result Date: 11/25/2018 CLINICAL DATA:  Headache, fever, COVID-19 EXAM: PORTABLE CHEST 1 VIEW COMPARISON:  None. FINDINGS: Patchy right lower lobe opacity, suspicious for pneumonia. Mild left basilar opacity, possibly atelectasis. No pleural effusion or pneumothorax. Cardiomegaly. IMPRESSION: Right lower lobe pneumonia in this patient with known COVID. Electronically Signed   By: Julian Hy M.D.   On: 11/25/2018 16:19

## 2018-11-27 NOTE — Progress Notes (Signed)
Pt wife face-timed w/ patient today & daughter called & updated. All questions & concerns addressed. Pt received IV Mg  Pt will be receiving Remdesivir shortly. (day 2/5)  Pt remains on RA Confused-BED ALARM remains on. Bed in low locked position, call bell within reach & functioning properly. Hourly safety checks complete. Will continue to monitor.

## 2018-11-27 NOTE — Evaluation (Signed)
Physical Therapy Evaluation Patient Details Name: Shaun Hobbs MRN: [id number] DOB: [birth year] Today's Date: 11/27/2018   History of Present Illness  85 y.o. male with PMHx of dementia, transfusion dependent MDS, HLD, hypothyroidism, BPH presented to the ED on 10/18 with cough, shortness of breath and fever-he was recently diagnosed with COVID-19 a few days prior to this presentation.  Upon further evaluation he was found to have  COVID-19 viral pneumonia.  Clinical Impression   Pt admitted with above diagnosis. Pt is quite confused this pm, most hx given is unreliable. Pt currently with functional limitations due to the deficits listed below (see PT Problem List). Pt will benefit from skilled PT to increase his overall strength, balance and coordination, activity tolerance, independence and safety with mobility to allow discharge to the venue listed below.       Follow Up Recommendations Supervision for mobility/OOB;Supervision/Assistance - 24 hour    Equipment Recommendations       Recommendations for Other Services       Precautions / Restrictions Precautions Precautions: Fall;Other (comment) Precaution Comments: cognition/ confused Restrictions Weight Bearing Restrictions: No      Mobility  Bed Mobility Overal bed mobility: Needs Assistance Bed Mobility: Supine to Sit     Supine to sit: Supervision        Transfers Overall transfer level: Needs assistance   Transfers: Sit to/from Stand Sit to Stand: Min assist            Ambulation/Gait Ambulation/Gait assistance: Min assist Gait Distance (Feet): 48 Feet Assistive device: Rolling walker (2 wheeled) Gait Pattern/deviations: Step-through pattern     General Gait Details: noted pt leans backwards when ambulating , into hyper extension in cervical and thoracic spine  Stairs            Wheelchair Mobility    Modified Rankin (Stroke Patients Only)       Balance Overall balance assessment:  Needs assistance Sitting-balance support: Feet supported Sitting balance-Leahy Scale: Fair   Postural control: Posterior lean Standing balance support: No upper extremity supported;During functional activity Standing balance-Leahy Scale: Poor                               Pertinent Vitals/Pain Pain Assessment: No/denies pain    Home Living Family/patient expects to be discharged to:: Private residence Living Arrangements: Spouse/significant other               Additional Comments: denies having any equipment but does better with walker    Prior Function Level of Independence: Independent with assistive device(s)         Comments: states he was independent but he is a poor historian and unreliable     Hand Dominance        Extremity/Trunk Assessment   Upper Extremity Assessment Upper Extremity Assessment: Defer to OT evaluation    Lower Extremity Assessment Lower Extremity Assessment: Generalized weakness       Communication   Communication: No difficulties  Cognition Arousal/Alertness: Awake/alert Behavior During Therapy: WFL for tasks assessed/performed Overall Cognitive Status: No family/caregiver present to determine baseline cognitive functioning                                 General Comments: as per chart and therapist own assessment pt is somewhat pleasantly confused      General Comments      Exercises  Assessment/Plan    PT Assessment Patient needs continued PT services  PT Problem List Decreased strength;Decreased activity tolerance;Decreased balance;Decreased mobility;Decreased coordination;Decreased cognition;Decreased knowledge of use of DME;Decreased safety awareness       PT Treatment Interventions Gait training;Functional mobility training;Balance training;Therapeutic exercise;Therapeutic activities;Patient/family education    PT Goals (Current goals can be found in the Care Plan section)  Acute  Rehab PT Goals Patient Stated Goal: unable to state goa;s Time For Goal Achievement: 12/11/18 Potential to Achieve Goals: Fair    Frequency Min 3X/week   Barriers to discharge        Co-evaluation               AM-PAC PT "6 Clicks" Mobility  Outcome Measure Help needed turning from your back to your side while in a flat bed without using bedrails?: A Little Help needed moving from lying on your back to sitting on the side of a flat bed without using bedrails?: A Little Help needed moving to and from a bed to a chair (including a wheelchair)?: A Little Help needed standing up from a chair using your arms (e.g., wheelchair or bedside chair)?: A Little Help needed to walk in hospital room?: A Little Help needed climbing 3-5 steps with a railing? : A Lot 6 Click Score: 17    End of Session   Activity Tolerance: Patient tolerated treatment well Patient left: in bed;with call bell/phone within reach;with bed alarm set   PT Visit Diagnosis: Unsteadiness on feet (R26.81);Muscle weakness (generalized) (M62.81)    Time: [id number] PT Time Calculation (min) (ACUTE ONLY): 19 min   Charges:   PT Evaluation $PT Eval Moderate Complexity: [place], PT   Delford Field 11/27/2018, 3:59 PM

## 2018-11-28 LAB — COMPREHENSIVE METABOLIC PANEL
ALT: 29 U/L (ref 0–44)
AST: 38 U/L (ref 15–41)
Albumin: 3 g/dL — ABNORMAL LOW (ref 3.5–5.0)
Alkaline Phosphatase: 36 U/L — ABNORMAL LOW (ref 38–126)
Anion gap: 8 (ref 5–15)
BUN: 45 mg/dL — ABNORMAL HIGH (ref 8–23)
CO2: 22 mmol/L (ref 22–32)
Calcium: 8.1 mg/dL — ABNORMAL LOW (ref 8.9–10.3)
Chloride: 106 mmol/L (ref 98–111)
Creatinine, Ser: 0.97 mg/dL (ref 0.61–1.24)
GFR calc Af Amer: >60 mL/min (ref >60)
GFR calc non Af Amer: >60 mL/min (ref >60)
Glucose, Bld: 163 mg/dL — ABNORMAL HIGH (ref 70–99)
Potassium: 4 mmol/L (ref 3.5–5.1)
Sodium: 136 mmol/L (ref 135–145)
Total Bilirubin: 0.9 mg/dL (ref 0.3–1.2)
Total Protein: 5.4 g/dL — ABNORMAL LOW (ref 6.5–8.1)

## 2018-11-28 LAB — CBC WITH DIFFERENTIAL/PLATELET
Abs Immature Granulocytes: 0.14 10*3/uL — ABNORMAL HIGH (ref 0.00–0.07)
Basophils Absolute: 0 10*3/uL (ref 0.0–0.1)
Basophils Relative: 0 %
Eosinophils Absolute: 0 10*3/uL (ref 0.0–0.5)
Eosinophils Relative: 0 %
HCT: 25.2 % — ABNORMAL LOW (ref 39.0–52.0)
Hemoglobin: 7.9 g/dL — ABNORMAL LOW (ref 13.0–17.0)
Immature Granulocytes: 6 %
Lymphocytes Relative: 13 %
Lymphs Abs: 0.3 10*3/uL — ABNORMAL LOW (ref 0.7–4.0)
MCH: 29.8 pg (ref 26.0–34.0)
MCHC: 31.3 g/dL (ref 30.0–36.0)
MCV: 95.1 fL (ref 80.0–100.0)
Monocytes Absolute: 0.1 10*3/uL (ref 0.1–1.0)
Monocytes Relative: 3 %
Neutro Abs: 1.9 10*3/uL (ref 1.7–7.7)
Neutrophils Relative %: 78 %
Platelets: 15 10*3/uL — CL (ref 150–400)
RBC: 2.65 MIL/uL — ABNORMAL LOW (ref 4.22–5.81)
RDW: 21.3 % — ABNORMAL HIGH (ref 11.5–15.5)
WBC: 2.4 10*3/uL — ABNORMAL LOW (ref 4.0–10.5)
nRBC: 1.6 % — ABNORMAL HIGH (ref 0.0–0.2)

## 2018-11-28 LAB — D-DIMER, QUANTITATIVE: D-Dimer, Quant: 2.39 ug/mL-FEU — ABNORMAL HIGH (ref 0.00–0.50)

## 2018-11-28 LAB — MAGNESIUM: Magnesium: 2 mg/dL (ref 1.7–2.4)

## 2018-11-28 LAB — FERRITIN: Ferritin: 1466 ng/mL — ABNORMAL HIGH (ref 24–336)

## 2018-11-28 LAB — C-REACTIVE PROTEIN: CRP: 4.2 mg/dL — ABNORMAL HIGH (ref <1.0)

## 2018-11-28 NOTE — Progress Notes (Signed)
Wife called x2 tried both contact #s listed for her. Left voicemail w/ PT update, dinner intake & callback info.

## 2018-11-28 NOTE — Plan of Care (Signed)
Updated the pt's wife regarding condition and vital signs. Pt is resting comfortably, denies pain and shortness of breath. Bed alarm is on. Will continue to monitor.

## 2018-11-28 NOTE — Progress Notes (Signed)
Occupational Therapy Evaluation Patient Details Name: Shaun Hobbs MRN: [id number] DOB: Apr 28, 1928 Today's Date: 11/28/2018    History of Present Illness 85 y.o. male with PMHx of dementia, transfusion dependent MDS, HLD, hypothyroidism, BPH presented to the ED on 10/18 with cough, shortness of breath and fever-he was recently diagnosed with COVID-19 a few days prior to this presentation.  Upon further evaluation he was found to have  COVID-19 viral pneumonia.   Clinical Impression   Talked with wife over the phone for PLOF. Pt walked and completed ADL tasks independently. Wife states they walked about 2 miles/day. Pt did have 2 falls PTA and a poor appetite. Pt with a significant functional decline, requiring Min A, occasional Mod A for posterior LOB. Wife would prefer for her husband to come home but is unsure what help will be available. Wife is elderly and also has Covid. Wife would like for the treatment team to discuss discharge options with her son. Recommend rehab at SNF unless family can provide 24/7 assistance. Will follow acutely and further assess functional status in am.     Follow Up Recommendations  SNF;Supervision/Assistance - 24 hour;Home health OT;Other (comment)(pending progress)    Equipment Recommendations  3 in 1 bedside commode    Recommendations for Other Services       Precautions / Restrictions Precautions Precautions: Fall Precaution Comments: cognition/ confused Restrictions Weight Bearing Restrictions: No      Mobility Bed Mobility Overal bed mobility: Needs Assistance Bed Mobility: Supine to Sit;Sit to Supine     Supine to sit: Supervision Sit to supine: Supervision   General bed mobility comments: needs cues for safety and line management assist  Transfers Overall transfer level: Needs assistance Equipment used: 1 person hand held assist Transfers: Sit to/from Stand Sit to Stand: Min assist              Balance Overall balance  assessment: Needs assistance Sitting-balance support: Feet supported Sitting balance-Leahy Scale: Fair   Postural control: Posterior lean Standing balance support: No upper extremity supported;During functional activity Standing balance-Leahy Scale: Poor                             ADL either performed or assessed with clinical judgement   ADL Overall ADL's : Needs assistance/impaired Eating/Feeding: Set up   Grooming: Set up;Sitting   Upper Body Bathing: Minimal assistance;Sitting   Lower Body Bathing: Moderate assistance;Sit to/from stand   Upper Body Dressing : Minimal assistance;Sitting   Lower Body Dressing: Moderate assistance;Sit to/from stand   Toilet Transfer: Minimal assistance;BSC   Toileting- Clothing Manipulation and Hygiene: Moderate assistance       Functional mobility during ADLs: Minimal assistance General ADL Comments: Unsteady - posterior lean at times     Vision Baseline Vision/History: Wears glasses       Perception     Praxis      Pertinent Vitals/Pain Pain Assessment: No/denies pain     Hand Dominance Right   Extremity/Trunk Assessment Upper Extremity Assessment Upper Extremity Assessment: Generalized weakness   Lower Extremity Assessment Lower Extremity Assessment: Generalized weakness   Cervical / Trunk Assessment Cervical / Trunk Assessment: Kyphotic   Communication Communication Communication: HOH   Cognition Arousal/Alertness: Awake/alert Behavior During Therapy: Impulsive Overall Cognitive Status: No family/caregiver present to determine baseline cognitive functioning  General Comments: still pleasantly confused   General Comments  Pt still quite confused needing cues for safety and also sequencing with mobility. used single tip cane for ambulation today and did marginally better than previous session. Still on room air noted desat to 78% noted on pedi finger  probe. once completed with ambulation was able to raise 02 sats to 90s within 1 min.    Exercises     Shoulder Instructions      Home Living Family/patient expects to be discharged to:: Private residence Living Arrangements: Spouse/significant other Available Help at Discharge: Available 24 hours/day(sick wife) Type of Home: House Home Access: Stairs to enter [employer] of Steps: 1   Home Layout: One level     Bathroom Shower/Tub: Tub/shower unit;Walk-in shower   Bathroom Toilet: Standard Bathroom Accessibility: Yes How Accessible: Accessible via walker Home Equipment: Walker - 2 wheels   Additional Comments: May have access to DME      Prior Functioning/Environment Level of Independence: Independent                 OT Problem List: Decreased strength;Decreased activity tolerance;Impaired balance (sitting and/or standing);Decreased cognition;Decreased safety awareness;Decreased knowledge of use of DME or AE;Cardiopulmonary status limiting activity      OT Treatment/Interventions: Self-care/ADL training;Therapeutic exercise;Neuromuscular education;Energy conservation;DME and/or AE instruction;Therapeutic activities;Cognitive remediation/compensation;Patient/family education;Balance training    OT Goals(Current goals can be found in the care plan section) Acute Rehab OT Goals Patient Stated Goal: per wife for pt to get stronger OT Goal Formulation: With patient/family Time For Goal Achievement: 12/12/18 Potential to Achieve Goals: Good  OT Frequency: Min 3X/week   Barriers to D/C:            Co-evaluation              AM-PAC OT "6 Clicks" Daily Activity     Outcome Measure Help from another person eating meals?: A Little Help from another person taking care of personal grooming?: A Little Help from another person toileting, which includes using toliet, bedpan, or urinal?: A Lot Help from another person bathing (including washing, rinsing,  drying)?: A Lot Help from another person to put on and taking off regular upper body clothing?: A Little Help from another person to put on and taking off regular lower body clothing?: A Lot 6 Click Score: 15   End of Session Nurse Communication: Mobility status  Activity Tolerance: Patient tolerated treatment well Patient left: in chair;with call bell/phone within reach;with chair alarm set  OT Visit Diagnosis: Unsteadiness on feet (R26.81);Other abnormalities of gait and mobility (R26.89);Muscle weakness (generalized) (M62.81);History of falling (Z91.81);Other symptoms and signs involving cognitive function                Time: [id number] OT Time Calculation (min): 21 min Charges:  OT General Charges $OT Visit: 1 Visit OT Evaluation $OT Eval Moderate Complexity: [place], OT/L   Acute OT Clinical Specialist Acute Rehabilitation Services Pager [phone] Office [phone]   [facility] 11/28/2018, 6:56 PM

## 2018-11-28 NOTE — Progress Notes (Signed)
PROGRESS NOTE                                                                                                                                                                                                             Patient Demographics:    Shaun Hobbs, is a 85 y.o. male, DOB - 09/29/1928, [id number]  Outpatient Primary MD for the patient is Patient, No Pcp Per   Admit date - 11/25/2018   LOS - 3  No chief complaint on file.      Brief Narrative: Patient is a 85 y.o. male with PMHx of dementia, transfusion dependent MDS, HLD, hypothyroidism, BPH presented to the ED on 10/18 with cough, shortness of breath and fever-he was recently diagnosed with COVID-19 a few days prior to this presentation.  Upon further evaluation he was found to have  COVID-19 viral pneumonia.  See below for further details   Subjective:    Shaun Hobbs today sensory unchanged-lying comfortably in bed-not on oxygen.  He was eating breakfast when I walked in.   Assessment  & Plan :   Covid 19 Viral pneumonia with superimposed bacterial pneumonia: Remains stable-continue Rocephin/Zithromax for bacterial pneumonia-and remdesivir and steroids for Covid pneumonia.  CRP is downtrending.  Blood cultures remain negative so far.  Fever: afebrile  O2 requirements: On RA  COVID-19 Labs: Recent Labs    11/25/18 1522  11/26/18 0539 11/27/18 0500 11/28/18 0537  DDIMER 5.43*  --  6.45* 3.63* 2.39*  FERRITIN  --    < > 1,444* 1,756* 1,466*  LDH 252*  --   --   --   --   CRP  --    < > 16.3* 8.6* 4.2*   < > = values in this interval not displayed.    Lab Results  Component Value Date   SARSCOV2NAA POSITIVE (A) 11/25/2018     COVID-19 Medications: Steroids: 10/18>> Remdesivir: 10/18 Actemra: Not a candidate given severe thrombocytopenia at baseline Convalescent Plasma: Not indicated as stable disease-can consider if patient worsens.  Research Studies:N/A  Other medications: Diuretics:Euvolemic-no need for lasix Antibiotics:10/18>> Rocephin/Zithromax  Prone/Incentive Spirometry: encouraged incentive spirometry use 3-4/hour.  DVT Prophylaxis  : SCDs due to severe thrombocytopenia.  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  Counts  are slightly better than usual baseline. Follow CBC.   Reviewed outpatient hematology note-appears patient gets transfusion every 2 weeks.  Hypothyroidism: Continue Synthroid  Dementia: Pleasantly confused-at risk of delirium during this hospital stay.  Continue Zoloft and Aricept.  BPH: Continue with Flomax  ABG: No results found for: PHART, PCO2ART, PO2ART, HCO3, TCO2, ACIDBASEDEF, O2SAT  Vent Settings: N/A  Condition - Stable-but at risk for severe disease.  Family Communication  : Spoke with son over the phone on 10/21  Code Status : DNR  Diet :  Diet Order            Diet regular Room service appropriate? Yes; Fluid consistency: Thin  Diet effective now               Disposition Plan  :  Remain hospitalized  Barriers to discharge: Needs to continue with IV Abx/steroids/remdesivir for 5 days total  Consults  :  None  Procedures  :  None  Antibiotics  :    Anti-infectives (From admission, onward)   Start     Dose/Rate Route Frequency Ordered Stop   11/26/18 1600  remdesivir 100 mg in sodium chloride 0.9 % 250 mL IVPB     100 mg 500 mL/hr over 30 Minutes Intravenous Every 24 hours 11/25/18 1843 11/30/18 1559   11/25/18 1845  remdesivir 200 mg in sodium chloride 0.9 % 250 mL IVPB     200 mg 500 mL/hr over 30 Minutes Intravenous  Once 11/25/18 1843 11/25/18 2048   11/25/18 1830  cefTRIAXone (ROCEPHIN) 1 g in sodium chloride 0.9 % 100 mL IVPB     1 g 200 mL/hr over 30 Minutes Intravenous Daily-1800 11/25/18 1800     11/25/18 1830  azithromycin (ZITHROMAX) 500  mg in sodium chloride 0.9 % 250 mL IVPB     500 mg 250 mL/hr over 60 Minutes Intravenous Daily-1800 11/25/18 1811        Inpatient Medications  Scheduled Meds: . atorvastatin  5 mg Oral Daily  . cholecalciferol  1,000 Units Oral Daily  . donepezil  10 mg Oral QHS  . levothyroxine  100 mcg Oral QAC breakfast  . methylPREDNISolone (SOLU-MEDROL) injection  60 mg Intravenous BID  . sertraline  100 mg Oral QHS  . tamsulosin  0.4 mg Oral QHS   Continuous Infusions: . azithromycin Stopped (11/27/18 1930)  . cefTRIAXone (ROCEPHIN)  IV Stopped (11/27/18 1930)  . remdesivir 100 mg in NS 250 mL 100 mg (11/27/18 1700)   PRN Meds:.acetaminophen, albuterol, guaiFENesin-dextromethorphan, ondansetron **OR** ondansetron (ZOFRAN) IV, polyethylene glycol   Time Spent in minutes  25  See all Orders from today for further details  Oren Binet M.D on 11/28/2018 at 10:57 AM  To page go to [email or web address] - use universal password  [facility] -  Office  [phone]    Objective:   Vitals:   11/27/18 1700 11/27/18 1800 11/27/18 1952 11/28/18 0309  BP: (!) 104/57 122/62 127/60 (!) 127/57  Pulse: (!) 58 69 66 72  Resp:   20 18  Temp:   98.2 F (36.8 C) (!) 97.4 F (36.3 C)  TempSrc:   Oral Oral  SpO2: 94% 94% 96% 97%  Weight:      Height:        Wt Readings from Last 3 Encounters:  11/26/18 72.9 kg  10/16/18 75.6 kg  08/20/18 75.8 kg     Intake/Output Summary (Last 24 hours) at 11/28/2018 1057 Last data filed at 11/28/2018 0311 Gross per 24  hour  Intake 960 ml  Output 275 ml  Net 685 ml     Physical Exam Gen Exam:Alert-pleasantly confused-not in any distress HEENT:atraumatic, normocephalic Chest: B/L clear to auscultation anteriorly CVS:S1S2 regular Abdomen:soft non tender, non distended Extremities:no edema Neurology: Non focal Skin: no rash   Data Review:    CBC Recent Labs  Lab 11/25/18 1523 11/26/18 0539 11/27/18 0500 11/28/18 0537  WBC 1.7*  1.6* 2.1* 2.4*  HGB 6.3* 7.9* 7.7* 7.9*  HCT 20.6* 25.4* 24.6* 25.2*  PLT <5* 17* 15* 15*  MCV 96.3 95.1 94.3 95.1  MCH 29.4 29.6 29.5 29.8  MCHC 30.6 31.1 31.3 31.3  RDW 22.2* 20.9* 21.5* 21.3*  LYMPHSABS 0.3* 0.3* 0.3* 0.3*  MONOABS 0.1 0.1 0.1 0.1  EOSABS 0.0 0.0 0.0 0.0  BASOSABS 0.0 0.0 0.0 0.0    Chemistries  Recent Labs  Lab 11/25/18 1522 11/25/18 1523 11/26/18 0539 11/27/18 0500 11/28/18 0537  NA  --  134* 135 137 136  K  --  3.9 3.6 3.8 4.0  CL  --  104 105 105 106  CO2  --  19* 22 22 22   GLUCOSE  --  135* 162* 169* 163*  BUN  --  28* 25* 36* 45*  CREATININE  --  1.16 0.82 0.93 0.97  CALCIUM  --  8.6* 8.4* 8.1* 8.1*  MG  --   --  1.9 1.9 2.0  AST 37  --  41 43* 38  ALT 15  --  18 23 29   ALKPHOS 42  --  39 32* 36*  BILITOT 1.3*  --  0.6 0.7 0.9   ------------------------------------------------------------------------------------------------------------------ Recent Labs    11/25/18 1523  TRIG 69    No results found for: HGBA1C ------------------------------------------------------------------------------------------------------------------ No results for input(s): TSH, T4TOTAL, T3FREE, THYROIDAB in the last 72 hours.  Invalid input(s): FREET3 ------------------------------------------------------------------------------------------------------------------ Recent Labs    11/27/18 0500 11/28/18 0537  FERRITIN 1,756* 1,466*    Coagulation profile No results for input(s): INR, PROTIME in the last 168 hours.  Recent Labs    11/27/18 0500 11/28/18 0537  DDIMER 3.63* 2.39*    Cardiac Enzymes No results for input(s): CKMB, TROPONINI, MYOGLOBIN in the last 168 hours.  Invalid input(s): CK ------------------------------------------------------------------------------------------------------------------ No results found for: BNP  Micro Results Recent Results (from the past 240 hour(s))  SARS CORONAVIRUS 2 (TAT 6-24 HRS) Nasopharyngeal  Nasopharyngeal Swab     Status: Abnormal   Collection Time: 11/25/18  3:16 PM   Specimen: Nasopharyngeal Swab  Result Value Ref Range Status   SARS Coronavirus 2 POSITIVE (A) NEGATIVE Final    Comment: RESULT CALLED TO, READ BACK BY AND VERIFIED WITH: L.WHITE RN [id number] 11/26/2018 MCCORMICK K (NOTE) SARS-CoV-2 target nucleic acids are DETECTED. The SARS-CoV-2 RNA is generally detectable in upper and lower respiratory specimens during the acute phase of infection. Positive results are indicative of active infection with SARS-CoV-2. Clinical  correlation with patient history and other diagnostic information is necessary to determine patient infection status. Positive results do  not rule out bacterial infection or co-infection with other viruses. The expected result is Negative. Fact Sheet for Patients: SugarRoll.be Fact Sheet for Healthcare Providers: https://www.woods-mathews.com/ This test is not yet approved or cleared by the [place] FDA and  has been authorized for detection and/or diagnosis of SARS-CoV-2 by FDA under an Emergency Use Authorization (EUA). This EUA will remain  in effect (meaning this test can be used) fo r the duration of the COVID-19 declaration under Section 564(b)(1)  of the Act, 21 U.S.C. section 360bbb-3(b)(1), unless the authorization is terminated or revoked sooner. Performed at [facility], [place] [address], [place], [place] [address]   Blood Culture (routine x 2)     Status: None (Preliminary result)   Collection Time: 11/25/18  3:23 PM   Specimen: BLOOD  Result Value Ref Range Status   Specimen Description   Final    BLOOD WRIST RIGHT Performed at [facility] [address] [place], [place] [address]    Special Requests   Final    BOTTLES DRAWN AEROBIC AND ANAEROBIC Blood Culture results may not be optimal due to an excessive volume of blood received in culture bottles Performed  at [facility] [address] [place], [place] [address]    Culture   Final    NO GROWTH 3 DAYS Performed at [facility], [place] [address], [place], [place] [address]    Report Status PENDING  Incomplete  Blood Culture (routine x 2)     Status: None (Preliminary result)   Collection Time: 11/25/18  3:23 PM   Specimen: BLOOD  Result Value Ref Range Status   Specimen Description   Final    BLOOD WRIST LEFT Performed at [facility] [address], [place], [place] [address]    Special Requests   Final    BOTTLES DRAWN AEROBIC AND ANAEROBIC Blood Culture adequate volume Performed at [facility] [address], [place], [place] [address]    Culture   Final    NO GROWTH 3 DAYS Performed at [facility], [place] [address], [place], [place] [address]    Report Status PENDING  Incomplete    Radiology Reports Dg Chest Portable 1 View  Result Date: 11/25/2018 CLINICAL DATA:  Headache, fever, COVID-19 EXAM: PORTABLE CHEST 1 VIEW COMPARISON:  None. FINDINGS: Patchy right lower lobe opacity, suspicious for pneumonia. Mild left basilar opacity, possibly atelectasis. No pleural effusion or pneumothorax. Cardiomegaly. IMPRESSION: Right lower lobe pneumonia in this patient with known COVID. Electronically Signed   By: Julian Hy M.D.   On: 11/25/2018 16:19

## 2018-11-28 NOTE — Progress Notes (Addendum)
Physical Therapy Treatment Patient Details Name: Shaun Hobbs MRN: [id number] DOB: [birth year] Today's Date: 11/28/2018    History of Present Illness 85 y.o. male with PMHx of dementia, transfusion dependent MDS, HLD, hypothyroidism, BPH presented to the ED on 10/18 with cough, shortness of breath and fever-he was recently diagnosed with COVID-19 a few days prior to this presentation.  Upon further evaluation he was found to have  COVID-19 viral pneumonia.    PT Comments    Pt his usual confused self, is agreeable to tx and states would like to use STC for ambulation. Was able to get in/out of bed with SBA and line management assist and ambulate in hall approx 162ft with min a and STC. Pt still on room air noted desat to min 78% but with cues for breathing able to regain oxygenation to 80s. Once completed w/ ambulation pt completes deep breathing of his own and sats increase to 90s.    Follow Up Recommendations  Supervision for mobility/OOB;Supervision/Assistance - 24 hour     Equipment Recommendations       Recommendations for Other Services       Precautions / Restrictions Precautions Precautions: Fall Precaution Comments: cognition/ confused Restrictions Weight Bearing Restrictions: No    Mobility  Bed Mobility Overal bed mobility: Modified Independent Bed Mobility: Supine to Sit;Sit to Supine     Supine to sit: Supervision Sit to supine: Supervision   General bed mobility comments: needs cues for safety and line management assist  Transfers Overall transfer level: Needs assistance Equipment used: Straight cane Transfers: Sit to/from Stand Sit to Stand: Min assist            Ambulation/Gait Ambulation/Gait assistance: Min assist Gait Distance (Feet): 100 Feet Assistive device: Straight cane       General Gait Details: posture is still awkward pt leans backwards when ambulating, noted a few LObs to L but pt did not notice these. Pt is on room air  noted  to desat to min 78% when ambulating, he states that mask is limiting his breathing. once back in room again pt with bizzare breathing technique but manages to raise 02 sats into 90s.   Stairs             Wheelchair Mobility    Modified Rankin (Stroke Patients Only)       Balance Overall balance assessment: Needs assistance Sitting-balance support: Feet supported Sitting balance-Leahy Scale: Fair   Postural control: Posterior lean Standing balance support: No upper extremity supported;During functional activity Standing balance-Leahy Scale: Poor                              Cognition Arousal/Alertness: Awake/alert Behavior During Therapy: Impulsive Overall Cognitive Status: No family/caregiver present to determine baseline cognitive functioning                                 General Comments: still pleasantly confused      Exercises      General Comments General comments (skin integrity, edema, etc.): Pt still quite confused needing cues for safety and also sequencing with mobility. used single tip cane for ambulation today and did marginally better than previous session. Still on room air noted desat to 78% noted on pedi finger probe. once completed with ambulation was able to raise 02 sats to 90s within 1 min.      Pertinent  Vitals/Pain Pain Assessment: No/denies pain    Home Living                      Prior Function            PT Goals (current goals can now be found in the care plan section) Acute Rehab PT Goals Time For Goal Achievement: 12/11/18 Potential to Achieve Goals: Fair Progress towards PT goals: Progressing toward goals    Frequency    Min 3X/week      PT Plan Current plan remains appropriate    Co-evaluation              AM-PAC PT "6 Clicks" Mobility   Outcome Measure  Help needed turning from your back to your side while in a flat bed without using bedrails?: A Little Help needed  moving from lying on your back to sitting on the side of a flat bed without using bedrails?: A Little Help needed moving to and from a bed to a chair (including a wheelchair)?: A Little Help needed standing up from a chair using your arms (e.g., wheelchair or bedside chair)?: A Little Help needed to walk in hospital room?: A Little Help needed climbing 3-5 steps with a railing? : A Lot 6 Click Score: 17    End of Session   Activity Tolerance: Treatment limited secondary to medical complications (Comment) Patient left: in bed;with call bell/phone within reach;with bed alarm set   PT Visit Diagnosis: Unsteadiness on feet (R26.81);Muscle weakness (generalized) (M62.81)     Time: [id number] PT Time Calculation (min) (ACUTE ONLY): 24 min  Charges:  $Gait Training: 8-22 mins $Therapeutic Activity: 8-22 mins                     Horald Chestnut, PT    Delford Field 11/28/2018, 3:48 PM

## 2018-11-29 LAB — COMPREHENSIVE METABOLIC PANEL
ALT: 38 U/L (ref 0–44)
AST: 42 U/L — ABNORMAL HIGH (ref 15–41)
Albumin: 2.9 g/dL — ABNORMAL LOW (ref 3.5–5.0)
Alkaline Phosphatase: 37 U/L — ABNORMAL LOW (ref 38–126)
Anion gap: 8 (ref 5–15)
BUN: 46 mg/dL — ABNORMAL HIGH (ref 8–23)
CO2: 22 mmol/L (ref 22–32)
Calcium: 8.2 mg/dL — ABNORMAL LOW (ref 8.9–10.3)
Chloride: 108 mmol/L (ref 98–111)
Creatinine, Ser: 0.92 mg/dL (ref 0.61–1.24)
GFR calc Af Amer: >60 mL/min (ref >60)
GFR calc non Af Amer: >60 mL/min (ref >60)
Glucose, Bld: 142 mg/dL — ABNORMAL HIGH (ref 70–99)
Potassium: 4.4 mmol/L (ref 3.5–5.1)
Sodium: 138 mmol/L (ref 135–145)
Total Bilirubin: 0.6 mg/dL (ref 0.3–1.2)
Total Protein: 5.3 g/dL — ABNORMAL LOW (ref 6.5–8.1)

## 2018-11-29 LAB — CBC WITH DIFFERENTIAL/PLATELET
Abs Immature Granulocytes: 0 10*3/uL (ref 0.00–0.07)
Band Neutrophils: 3 %
Basophils Absolute: 0 10*3/uL (ref 0.0–0.1)
Basophils Relative: 0 %
Eosinophils Absolute: 0 10*3/uL (ref 0.0–0.5)
Eosinophils Relative: 0 %
HCT: 24.1 % — ABNORMAL LOW (ref 39.0–52.0)
Hemoglobin: 7.4 g/dL — ABNORMAL LOW (ref 13.0–17.0)
Lymphocytes Relative: 15 %
Lymphs Abs: 0.3 10*3/uL — ABNORMAL LOW (ref 0.7–4.0)
MCH: 29.4 pg (ref 26.0–34.0)
MCHC: 30.7 g/dL (ref 30.0–36.0)
MCV: 95.6 fL (ref 80.0–100.0)
Monocytes Absolute: 0.1 10*3/uL (ref 0.1–1.0)
Monocytes Relative: 4 %
Myelocytes: 2 %
Neutro Abs: 1.8 10*3/uL (ref 1.7–7.7)
Neutrophils Relative %: 76 %
Platelets: 14 10*3/uL — CL (ref 150–400)
RBC: 2.52 MIL/uL — ABNORMAL LOW (ref 4.22–5.81)
RDW: 21 % — ABNORMAL HIGH (ref 11.5–15.5)
WBC: 2.3 10*3/uL — ABNORMAL LOW (ref 4.0–10.5)
nRBC: 0.9 % — ABNORMAL HIGH (ref 0.0–0.2)

## 2018-11-29 LAB — MAGNESIUM: Magnesium: 1.9 mg/dL (ref 1.7–2.4)

## 2018-11-29 LAB — C-REACTIVE PROTEIN: CRP: 2.4 mg/dL — ABNORMAL HIGH (ref <1.0)

## 2018-11-29 LAB — D-DIMER, QUANTITATIVE: D-Dimer, Quant: 1.78 ug/mL-FEU — ABNORMAL HIGH (ref 0.00–0.50)

## 2018-11-29 LAB — FERRITIN: Ferritin: 1207 ng/mL — ABNORMAL HIGH (ref 24–336)

## 2018-11-29 MED ORDER — METHYLPREDNISOLONE SODIUM SUCC 40 MG IJ SOLR
40.0000 mg | Freq: Two times a day (BID) | INTRAMUSCULAR | Status: DC
  Administered 2018-11-29 – 2018-11-30 (×2): 40 mg via INTRAVENOUS
  Filled 2018-11-29 (×2): qty 1

## 2018-11-29 NOTE — TOC Initial Note (Signed)
Transition of Care [facility]) - Initial/Assessment Note    Patient Details  Name: Shaun Hobbs MRN: [id number] Date of Birth: 10-28-1928  Transition of Care [facility]) CM/SW Contact:    Ninfa Meeker, RN Phone Number: [phone] (working remotely) 11/29/2018, 9:11 AM  Clinical Narrative:  Patient is a 85 y.o. male with PMHx of dementia, transfusion dependent MDS, HLD, hypothyroidism, BPH presented to the ED on 10/18 with cough, shortness of breath and fever-he was recently diagnosed with COVID-19 a few days prior to this admission.Upon further evaluation he was found to have  COVID-19 viral pneumonia. Patient is on Remdesivir, steroids, on room air at this time. Case manager spoke with patient's son- Kelton Pillar [phone] concerning his dad's discharge plan. Whitt requests that all calls concerning his dad come to him first, his mom has COVID and trying to recover. Whitt and his siter will discuss everything with her. Case manager answered questions concerning [facility] vs SNF, At this time he wants to see what PT says but he also feels his dad will need shortterm rehab. Case manager has been given permission to fax pateint out to the facilities that are accepting COVID patient's. Will continue to monitor.                         Patient Goals and CMS Choice        Expected Discharge Plan and Services                                                Prior Living Arrangements/Services                       Activities of Daily Living Home Assistive Devices/Equipment: Eyeglasses ADL Screening (condition at time of admission) Patient's cognitive ability adequate to safely complete daily activities?: Yes Is the patient deaf or have difficulty hearing?: No Does the patient have difficulty seeing, even when wearing glasses/contacts?: No Does the patient have difficulty concentrating, remembering, or making decisions?: Yes Patient able to express need for assistance with  ADLs?: Yes Does the patient have difficulty dressing or bathing?: Yes Independently performs ADLs?: No Communication: Independent Dressing (OT): Needs assistance Is this a change from baseline?: Change from baseline, expected to last >3 days Grooming: Needs assistance Is this a change from baseline?: Change from baseline, expected to last >3 days Feeding: Needs assistance Is this a change from baseline?: Change from baseline, expected to last >3 days Bathing: Needs assistance Is this a change from baseline?: Change from baseline, expected to last >3 days Toileting: Needs assistance Is this a change from baseline?: Change from baseline, expected to last >3days In/Out Bed: Needs assistance Is this a change from baseline?: Change from baseline, expected to last >3 days Walks in Home: Needs assistance Is this a change from baseline?: Change from baseline, expected to last >3 days Does the patient have difficulty walking or climbing stairs?: Yes Weakness of Legs: Both Weakness of Arms/Hands: Both  Permission Sought/Granted                  Emotional Assessment              Admission diagnosis:  Thrombocytopenia ([place]) [D69.6] MDS (myelodysplastic syndrome) ([place]) [D46.9] Anemia, unspecified type [D64.9] [id number] [U07.1] Patient Active Problem List   Diagnosis Date Noted  .  Pneumonia due to COVID-19 virus 11/25/2018  . Myelodysplasia (myelodysplastic syndrome) ([place]) 11/25/2018  . Dementia without behavioral disturbance ([place]) 11/25/2018   PCP:  Patient, No Pcp Per Pharmacy:   [facility] [place] [address] [place] [address] Phone: [phone] Fax: [facility] [address] [place] [address] [place] [address] Phone: [phone] Fax: [phone]     Social Determinants of Health (SDOH) Interventions    Readmission Risk Interventions No  flowsheet data found.

## 2018-11-29 NOTE — Care Management (Signed)
Case manager updated Darden Dates that [facility] offered a bed, he accepted for his dad. CM informed [facility] @ [facility].    Lovenia Kim, RN BSN Case Manager  [phone]

## 2018-11-29 NOTE — Progress Notes (Signed)
Wife facetimed

## 2018-11-29 NOTE — Progress Notes (Signed)
Whitt updated

## 2018-11-29 NOTE — Progress Notes (Signed)
PROGRESS NOTE                                                                                                                                                                                                             Patient Demographics:    Shaun Hobbs, is a 85 y.o. male, DOB - Sep 24, 1928, [id number]  Outpatient Primary MD for the patient is Patient, No Pcp Per   Admit date - 11/25/2018   LOS - 4  No chief complaint on file.      Brief Narrative: Patient is a 85 y.o. male with PMHx of dementia, transfusion dependent MDS, HLD, hypothyroidism, BPH presented to the ED on 10/18 with cough, shortness of breath and fever-he was recently diagnosed with COVID-19 a few days prior to this presentation.  Upon further evaluation he was found to have  COVID-19 viral pneumonia.  See below for further details   Subjective:    Shaun Hobbs remains unchanged-he was sitting up and eating breakfast.  He remains pleasantly confused.  He is not on oxygen-and is stable on room air.    Assessment  & Plan :   Covid 19 Viral pneumonia with superimposed bacterial pneumonia: Stable-we will finish a course of IV Rocephin today.  He remains on Remdesivir-last day on 10/23.  Continue steroids-but will begin taper.  Blood cultures remain negative.    Fever: afebrile  O2 requirements: On RA  COVID-19 Labs: Recent Labs    11/27/18 0500 11/28/18 0537 11/29/18 0523  DDIMER 3.63* 2.39* 1.78*  FERRITIN 1,756* 1,466* 1,207*  CRP 8.6* 4.2* 2.4*    Lab Results  Component Value Date   SARSCOV2NAA POSITIVE (A) 11/25/2018     COVID-19 Medications: Steroids: 10/18>> Remdesivir: 10/19>> Actemra: Not a candidate given severe thrombocytopenia at baseline Convalescent Plasma: Not indicated as stable disease-can consider if patient worsens. Research Studies:N/A  Other medications: Diuretics:Euvolemic-no need for lasix Antibiotics:10/18>>  Rocephin/Zithromax  Prone/Incentive Spirometry: encouraged incentive spirometry use 3-4/hour.  DVT Prophylaxis  : SCDs due to severe thrombocytopenia.  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  Counts are slightly better than usual baseline. Follow CBC.   Reviewed outpatient hematology note-appears patient gets transfusion every 2 weeks.  Hypothyroidism: Continue Synthroid  Dementia: Pleasantly confused-at risk of delirium during  this hospital stay.  Continue Zoloft and Aricept.  BPH: Continue with Flomax  ABG: No results found for: PHART, PCO2ART, PO2ART, HCO3, TCO2, ACIDBASEDEF, O2SAT  Vent Settings: N/A  Condition - Stable  Family Communication  : Spoke with son over the phone on 10/22  Code Status : DNR  Diet :  Diet Order            Diet regular Room service appropriate? Yes; Fluid consistency: Thin  Diet effective now               Disposition Plan  :  Remain hospitalized  Barriers to discharge: Needs to continue with IV Abx/steroids/remdesivir for 5 days total  Consults  :  None  Procedures  :  None  Antibiotics  :    Anti-infectives (From admission, onward)   Start     Dose/Rate Route Frequency Ordered Stop   11/26/18 1600  remdesivir 100 mg in sodium chloride 0.9 % 250 mL IVPB     100 mg 500 mL/hr over 30 Minutes Intravenous Every 24 hours 11/25/18 1843 11/30/18 1559   11/25/18 1845  remdesivir 200 mg in sodium chloride 0.9 % 250 mL IVPB     200 mg 500 mL/hr over 30 Minutes Intravenous  Once 11/25/18 1843 11/25/18 2048   11/25/18 1830  cefTRIAXone (ROCEPHIN) 1 g in sodium chloride 0.9 % 100 mL IVPB     1 g 200 mL/hr over 30 Minutes Intravenous Daily-1800 11/25/18 1800 11/29/18 2359   11/25/18 1830  azithromycin (ZITHROMAX) 500 mg in sodium chloride 0.9 % 250 mL IVPB     500 mg 250 mL/hr over 60 Minutes Intravenous Daily-1800 11/25/18 1811 11/28/18  1928      Inpatient Medications  Scheduled Meds: . atorvastatin  5 mg Oral Daily  . cholecalciferol  1,000 Units Oral Daily  . donepezil  10 mg Oral QHS  . levothyroxine  100 mcg Oral QAC breakfast  . methylPREDNISolone (SOLU-MEDROL) injection  60 mg Intravenous BID  . sertraline  100 mg Oral QHS  . tamsulosin  0.4 mg Oral QHS   Continuous Infusions: . cefTRIAXone (ROCEPHIN)  IV 1 g (11/28/18 1811)  . remdesivir 100 mg in NS 250 mL 100 mg (11/28/18 1618)   PRN Meds:.acetaminophen, albuterol, guaiFENesin-dextromethorphan, ondansetron **OR** ondansetron (ZOFRAN) IV, polyethylene glycol   Time Spent in minutes  25  See all Orders from today for further details  Oren Binet M.D on 11/29/2018 at 11:50 AM  To page go to [email or web address] - use universal password  [facility] -  Office  [phone]    Objective:   Vitals:   11/28/18 1635 11/28/18 2006 11/29/18 0400 11/29/18 0729  BP: 121/62 131/70  125/62  Pulse: 64 78 (!) 59 71  Resp: 18 20  16   Temp: 98 F (36.7 C) 98 F (36.7 C) 98.3 F (36.8 C) (!) 97.2 F (36.2 C)  TempSrc: Oral Oral Oral Oral  SpO2: 99% 96% 92% 96%  Weight:      Height:        Wt Readings from Last 3 Encounters:  11/26/18 72.9 kg  10/16/18 75.6 kg  08/20/18 75.8 kg     Intake/Output Summary (Last 24 hours) at 11/29/2018 1150 Last data filed at 11/29/2018 0930 Gross per 24 hour  Intake 1200 ml  Output -  Net 1200 ml     Physical Exam Gen Exam:Alert awake-not in any distress-remains mildly but pleasantly confused. HEENT:atraumatic, normocephalic Chest: B/L clear to auscultation anteriorly CVS:S1S2 regular  Abdomen:soft non tender, non distended Extremities:no edema Neurology: Non focal Skin: no rash   Data Review:    CBC Recent Labs  Lab 11/25/18 1523 11/26/18 0539 11/27/18 0500 11/28/18 0537 11/29/18 0523  WBC 1.7* 1.6* 2.1* 2.4* 2.3*  HGB 6.3* 7.9* 7.7* 7.9* 7.4*  HCT 20.6* 25.4* 24.6* 25.2* 24.1*  PLT  <5* 17* 15* 15* 14*  MCV 96.3 95.1 94.3 95.1 95.6  MCH 29.4 29.6 29.5 29.8 29.4  MCHC 30.6 31.1 31.3 31.3 30.7  RDW 22.2* 20.9* 21.5* 21.3* 21.0*  LYMPHSABS 0.3* 0.3* 0.3* 0.3* 0.3*  MONOABS 0.1 0.1 0.1 0.1 0.1  EOSABS 0.0 0.0 0.0 0.0 0.0  BASOSABS 0.0 0.0 0.0 0.0 0.0    Chemistries  Recent Labs  Lab 11/25/18 1522 11/25/18 1523 11/26/18 0539 11/27/18 0500 11/28/18 0537 11/29/18 0523  NA  --  134* 135 137 136 138  K  --  3.9 3.6 3.8 4.0 4.4  CL  --  104 105 105 106 108  CO2  --  19* 22 22 22 22   GLUCOSE  --  135* 162* 169* 163* 142*  BUN  --  28* 25* 36* 45* 46*  CREATININE  --  1.16 0.82 0.93 0.97 0.92  CALCIUM  --  8.6* 8.4* 8.1* 8.1* 8.2*  MG  --   --  1.9 1.9 2.0 1.9  AST 37  --  41 43* 38 42*  ALT 15  --  18 23 29  38  ALKPHOS 42  --  39 32* 36* 37*  BILITOT 1.3*  --  0.6 0.7 0.9 0.6   ------------------------------------------------------------------------------------------------------------------ No results for input(s): CHOL, HDL, LDLCALC, TRIG, CHOLHDL, LDLDIRECT in the last 72 hours.  No results found for: HGBA1C ------------------------------------------------------------------------------------------------------------------ No results for input(s): TSH, T4TOTAL, T3FREE, THYROIDAB in the last 72 hours.  Invalid input(s): FREET3 ------------------------------------------------------------------------------------------------------------------ Recent Labs    11/28/18 0537 11/29/18 0523  FERRITIN 1,466* 1,207*    Coagulation profile No results for input(s): INR, PROTIME in the last 168 hours.  Recent Labs    11/28/18 0537 11/29/18 0523  DDIMER 2.39* 1.78*    Cardiac Enzymes No results for input(s): CKMB, TROPONINI, MYOGLOBIN in the last 168 hours.  Invalid input(s): CK ------------------------------------------------------------------------------------------------------------------ No results found for: BNP  Micro Results Recent Results (from  the past 240 hour(s))  SARS CORONAVIRUS 2 (TAT 6-24 HRS) Nasopharyngeal Nasopharyngeal Swab     Status: Abnormal   Collection Time: 11/25/18  3:16 PM   Specimen: Nasopharyngeal Swab  Result Value Ref Range Status   SARS Coronavirus 2 POSITIVE (A) NEGATIVE Final    Comment: RESULT CALLED TO, READ BACK BY AND VERIFIED WITH: L.WHITE RN [id number] 11/26/2018 MCCORMICK K (NOTE) SARS-CoV-2 target nucleic acids are DETECTED. The SARS-CoV-2 RNA is generally detectable in upper and lower respiratory specimens during the acute phase of infection. Positive results are indicative of active infection with SARS-CoV-2. Clinical  correlation with patient history and other diagnostic information is necessary to determine patient infection status. Positive results do  not rule out bacterial infection or co-infection with other viruses. The expected result is Negative. Fact Sheet for Patients: SugarRoll.be Fact Sheet for Healthcare Providers: https://www.woods-mathews.com/ This test is not yet approved or cleared by the [place] FDA and  has been authorized for detection and/or diagnosis of SARS-CoV-2 by FDA under an Emergency Use Authorization (EUA). This EUA will remain  in effect (meaning this test can be used) fo r the duration of the COVID-19 declaration under Section 564(b)(1) of the Act,  21 U.S.C. section 360bbb-3(b)(1), unless the authorization is terminated or revoked sooner. Performed at [facility], [place] [address] [place], [place] [address]   Blood Culture (routine x 2)     Status: None (Preliminary result)   Collection Time: 11/25/18  3:23 PM   Specimen: BLOOD  Result Value Ref Range Status   Specimen Description   Final    BLOOD WRIST RIGHT Performed at [facility] [address] [place], [place] [address]    Special Requests   Final    BOTTLES DRAWN AEROBIC AND ANAEROBIC Blood Culture results may not be optimal  due to an excessive volume of blood received in culture bottles Performed at [facility] [address] [place], [place] [address]    Culture   Final    NO GROWTH 4 DAYS Performed at [facility], [place] [address] [place], [place] [address]    Report Status PENDING  Incomplete  Blood Culture (routine x 2)     Status: None (Preliminary result)   Collection Time: 11/25/18  3:23 PM   Specimen: BLOOD  Result Value Ref Range Status   Specimen Description   Final    BLOOD WRIST LEFT Performed at [facility] [address], [place], [place] [address]    Special Requests   Final    BOTTLES DRAWN AEROBIC AND ANAEROBIC Blood Culture adequate volume Performed at [facility] [address], [place], [place] [address]    Culture   Final    NO GROWTH 4 DAYS Performed at [facility], [place] [address], [place],  [address]    Report Status PENDING  Incomplete    Radiology Reports Dg Chest Portable 1 View  Result Date: 11/25/2018 CLINICAL DATA:  Headache, fever, COVID-19 EXAM: PORTABLE CHEST 1 VIEW COMPARISON:  None. FINDINGS: Patchy right lower lobe opacity, suspicious for pneumonia. Mild left basilar opacity, possibly atelectasis. No pleural effusion or pneumothorax. Cardiomegaly. IMPRESSION: Right lower lobe pneumonia in this patient with known COVID. Electronically Signed   By: Julian Hy M.D.   On: 11/25/2018 16:19

## 2018-11-29 NOTE — NC FL2 (Signed)
[place] LEVEL OF CARE SCREENING TOOL     IDENTIFICATION  Patient Name: Shaun Hobbs Birthdate: Jul 04, 1928 Sex: male Admission Date (Current Location): 11/25/2018  [facility] and [place] Number:  Herbalist and Address:  [facility]. [facility], [place] [address], [place], [place])      Provider Number: [id number]  Attending Physician Name and Address:  Jonetta Osgood, MD  Relative Name and Phone Number:  Wife: Iwao Surowiec Son is 1st contact : Darden Dates [phone]    Current Level of Care: Hospital Recommended Level of Care: [place] Prior Approval Number:    Date Approved/Denied:   PASRR Number:    Discharge Plan: SNF    Current Diagnoses: Patient Active Problem List   Diagnosis Date Noted  . Pneumonia due to COVID-19 virus 11/25/2018  . Myelodysplasia (myelodysplastic syndrome) ([place]) 11/25/2018  . Dementia without behavioral disturbance ([place]) 11/25/2018    Orientation RESPIRATION BLADDER Height & Weight     Self  Normal Continent Weight: 72.9 kg Height:  5' 10.5" (179.1 cm)  BEHAVIORAL SYMPTOMS/MOOD NEUROLOGICAL BOWEL NUTRITION STATUS      Continent Diet(regular)  AMBULATORY STATUS COMMUNICATION OF NEEDS Skin   Supervision Verbally Normal                       Personal Care Assistance Level of Assistance  (needs cues and prompting)           Functional Limitations Info             SPECIAL CARE FACTORS FREQUENCY  PT (By licensed PT), OT (By licensed OT)     PT Frequency: 5x/week OT Frequency: min 3x/week            Contractures Contractures Info: (P) Not present    Additional Factors Info  Allergies, Isolation Precautions Code Status Info: DNR Allergies Info: Sulfa antibiotics     Isolation Precautions Info: COVID positive     Current Medications (11/29/2018):  This is the current hospital active medication list Current  Facility-Administered Medications  Medication Dose Route Frequency Provider Last Rate Last Dose  . acetaminophen (TYLENOL) tablet 650 mg  650 mg Oral Q6H PRN Barb Merino, MD   650 mg at 11/26/18 2105  . albuterol (VENTOLIN HFA) 108 (90 Base) MCG/ACT inhaler 2 puff  2 puff Inhalation Q6H PRN Barb Merino, MD      . atorvastatin (LIPITOR) tablet 5 mg  5 mg Oral Daily Barb Merino, MD   5 mg at 11/29/18 0928  . cefTRIAXone (ROCEPHIN) 1 g in sodium chloride 0.9 % 100 mL IVPB  1 g Intravenous q1800 Jonetta Osgood, MD 200 mL/hr at 11/28/18 1811 1 g at 11/28/18 1811  . cholecalciferol (VITAMIN D) tablet 1,000 Units  1,000 Units Oral Daily Barb Merino, MD   1,000 Units at 11/29/18 [phone]  . donepezil (ARICEPT) tablet 10 mg  10 mg Oral QHS Barb Merino, MD   10 mg at 11/28/18 2109  . guaiFENesin-dextromethorphan (ROBITUSSIN DM) 100-10 MG/5ML syrup 10 mL  10 mL Oral Q4H PRN Barb Merino, MD   10 mL at 11/26/18 2105  . levothyroxine (SYNTHROID) tablet 100 mcg  100 mcg Oral QAC breakfast Barb Merino, MD   100 mcg at 11/29/18 0527  . methylPREDNISolone sodium succinate (SOLU-MEDROL) 125 mg/2 mL injection 60 mg  60 mg Intravenous BID Barb Merino, MD   60 mg at 11/29/18 0528  . ondansetron (  ZOFRAN) tablet 4 mg  4 mg Oral Q6H PRN Barb Merino, MD       Or  . ondansetron (ZOFRAN) injection 4 mg  4 mg Intravenous Q6H PRN Barb Merino, MD      . polyethylene glycol (MIRALAX / GLYCOLAX) packet 17 g  17 g Oral Daily PRN Barb Merino, MD      . remdesivir 100 mg in sodium chloride 0.9 % 250 mL IVPB  100 mg Intravenous Q24H Adrian Saran, RPH 500 mL/hr at 11/28/18 1618 100 mg at 11/28/18 1618  . sertraline (ZOLOFT) tablet 100 mg  100 mg Oral QHS Barb Merino, MD   100 mg at 11/28/18 2108  . tamsulosin (FLOMAX) capsule 0.4 mg  0.4 mg Oral QHS Barb Merino, MD   0.4 mg at 11/28/18 2109     Discharge Medications: Please see discharge summary for a list of discharge  medications.  Relevant Imaging Results:  Relevant Lab Results:   Additional Information [id number]  Ninfa Meeker, RN

## 2018-11-29 NOTE — NC FL2 (Signed)
[place] LEVEL OF CARE SCREENING TOOL     IDENTIFICATION  Patient Name: Shaun Hobbs Birthdate: August 21, 1928 Sex: male Admission Date (Current Location): 11/25/2018  [facility] and [place] Number:  Herbalist and Address:  [facility] [place], [place] [address], [place], [place])      Provider Number: [id number]  Attending Physician Name and Address:  Jonetta Osgood, MD  Relative Name and Phone Number:  Wife: Quantae Porter Son is 1st contact : Darden Dates [phone]    Current Level of Care: Hospital Recommended Level of Care: [place] Prior Approval Number:    Date Approved/Denied:   PASRR Number:    Discharge Plan: SNF    Current Diagnoses: Patient Active Problem List   Diagnosis Date Noted  . Pneumonia due to COVID-19 virus 11/25/2018  . Myelodysplasia (myelodysplastic syndrome) ([place]) 11/25/2018  . Dementia without behavioral disturbance ([place]) 11/25/2018    Orientation RESPIRATION BLADDER Height & Weight     Self  Normal Continent Weight: 72.9 kg Height:  5' 10.5" (179.1 cm)  BEHAVIORAL SYMPTOMS/MOOD NEUROLOGICAL BOWEL NUTRITION STATUS      Continent Diet(regular)  AMBULATORY STATUS COMMUNICATION OF NEEDS Skin   Supervision Verbally Normal                       Personal Care Assistance Level of Assistance  (needs cues and prompting)           Functional Limitations Info             SPECIAL CARE FACTORS FREQUENCY  PT (By licensed PT), OT (By licensed OT)     PT Frequency: 5x/week OT Frequency: min 3x/week            Contractures Contractures Info: (P) Not present    Additional Factors Info  Allergies, Isolation Precautions Code Status Info: DNR Allergies Info: Sulfa antibiotics     Isolation Precautions Info: COVID positive     Current Medications (11/29/2018):  This is the current hospital active medication list Current  Facility-Administered Medications  Medication Dose Route Frequency Provider Last Rate Last Dose  . acetaminophen (TYLENOL) tablet 650 mg  650 mg Oral Q6H PRN Barb Merino, MD   650 mg at 11/26/18 2105  . albuterol (VENTOLIN HFA) 108 (90 Base) MCG/ACT inhaler 2 puff  2 puff Inhalation Q6H PRN Barb Merino, MD      . atorvastatin (LIPITOR) tablet 5 mg  5 mg Oral Daily Barb Merino, MD   5 mg at 11/29/18 0928  . cefTRIAXone (ROCEPHIN) 1 g in sodium chloride 0.9 % 100 mL IVPB  1 g Intravenous q1800 Jonetta Osgood, MD 200 mL/hr at 11/28/18 1811 1 g at 11/28/18 1811  . cholecalciferol (VITAMIN D) tablet 1,000 Units  1,000 Units Oral Daily Barb Merino, MD   1,000 Units at 11/29/18 [phone]  . donepezil (ARICEPT) tablet 10 mg  10 mg Oral QHS Barb Merino, MD   10 mg at 11/28/18 2109  . guaiFENesin-dextromethorphan (ROBITUSSIN DM) 100-10 MG/5ML syrup 10 mL  10 mL Oral Q4H PRN Barb Merino, MD   10 mL at 11/26/18 2105  . levothyroxine (SYNTHROID) tablet 100 mcg  100 mcg Oral QAC breakfast Barb Merino, MD   100 mcg at 11/29/18 0527  . methylPREDNISolone sodium succinate (SOLU-MEDROL) 125 mg/2 mL injection 60 mg  60 mg Intravenous BID Barb Merino, MD   60 mg at 11/29/18 0528  . ondansetron (  ZOFRAN) tablet 4 mg  4 mg Oral Q6H PRN Barb Merino, MD       Or  . ondansetron (ZOFRAN) injection 4 mg  4 mg Intravenous Q6H PRN Barb Merino, MD      . polyethylene glycol (MIRALAX / GLYCOLAX) packet 17 g  17 g Oral Daily PRN Barb Merino, MD      . remdesivir 100 mg in sodium chloride 0.9 % 250 mL IVPB  100 mg Intravenous Q24H Adrian Saran, RPH 500 mL/hr at 11/28/18 1618 100 mg at 11/28/18 1618  . sertraline (ZOLOFT) tablet 100 mg  100 mg Oral QHS Barb Merino, MD   100 mg at 11/28/18 2108  . tamsulosin (FLOMAX) capsule 0.4 mg  0.4 mg Oral QHS Barb Merino, MD   0.4 mg at 11/28/18 2109     Discharge Medications: Please see discharge summary for a list of discharge  medications.  Relevant Imaging Results:  Relevant Lab Results:   Additional Information [id number]  Ninfa Meeker, RN

## 2018-11-30 DIAGNOSIS — D696 Thrombocytopenia, unspecified: Secondary | ICD-10-CM

## 2018-11-30 DIAGNOSIS — D649 Anemia, unspecified: Secondary | ICD-10-CM

## 2018-11-30 LAB — CBC WITH DIFFERENTIAL/PLATELET
Abs Immature Granulocytes: 0.38 10*3/uL — ABNORMAL HIGH (ref 0.00–0.07)
Basophils Absolute: 0 10*3/uL (ref 0.0–0.1)
Basophils Relative: 0 %
Eosinophils Absolute: 0 10*3/uL (ref 0.0–0.5)
Eosinophils Relative: 0 %
HCT: 24.8 % — ABNORMAL LOW (ref 39.0–52.0)
Hemoglobin: 7.7 g/dL — ABNORMAL LOW (ref 13.0–17.0)
Immature Granulocytes: 13 %
Lymphocytes Relative: 9 %
Lymphs Abs: 0.3 10*3/uL — ABNORMAL LOW (ref 0.7–4.0)
MCH: 29.3 pg (ref 26.0–34.0)
MCHC: 31 g/dL (ref 30.0–36.0)
MCV: 94.3 fL (ref 80.0–100.0)
Monocytes Absolute: 0.2 10*3/uL (ref 0.1–1.0)
Monocytes Relative: 7 %
Neutro Abs: 2.2 10*3/uL (ref 1.7–7.7)
Neutrophils Relative %: 71 %
Platelets: 15 10*3/uL — CL (ref 150–400)
RBC: 2.63 MIL/uL — ABNORMAL LOW (ref 4.22–5.81)
RDW: 20.6 % — ABNORMAL HIGH (ref 11.5–15.5)
WBC: 3 10*3/uL — ABNORMAL LOW (ref 4.0–10.5)
nRBC: 0.7 % — ABNORMAL HIGH (ref 0.0–0.2)

## 2018-11-30 LAB — COMPREHENSIVE METABOLIC PANEL
ALT: 40 U/L (ref 0–44)
AST: 38 U/L (ref 15–41)
Albumin: 3.2 g/dL — ABNORMAL LOW (ref 3.5–5.0)
Alkaline Phosphatase: 38 U/L (ref 38–126)
Anion gap: 8 (ref 5–15)
BUN: 41 mg/dL — ABNORMAL HIGH (ref 8–23)
CO2: 22 mmol/L (ref 22–32)
Calcium: 8.1 mg/dL — ABNORMAL LOW (ref 8.9–10.3)
Chloride: 106 mmol/L (ref 98–111)
Creatinine, Ser: 0.89 mg/dL (ref 0.61–1.24)
GFR calc Af Amer: >60 mL/min (ref >60)
GFR calc non Af Amer: >60 mL/min (ref >60)
Glucose, Bld: 173 mg/dL — ABNORMAL HIGH (ref 70–99)
Potassium: 3.9 mmol/L (ref 3.5–5.1)
Sodium: 136 mmol/L (ref 135–145)
Total Bilirubin: 0.5 mg/dL (ref 0.3–1.2)
Total Protein: 5.5 g/dL — ABNORMAL LOW (ref 6.5–8.1)

## 2018-11-30 LAB — MAGNESIUM: Magnesium: 1.9 mg/dL (ref 1.7–2.4)

## 2018-11-30 LAB — CULTURE, BLOOD (ROUTINE X 2)
Culture: NO GROWTH
Culture: NO GROWTH
Special Requests: ADEQUATE

## 2018-11-30 LAB — FERRITIN: Ferritin: 1161 ng/mL — ABNORMAL HIGH (ref 24–336)

## 2018-11-30 LAB — C-REACTIVE PROTEIN: CRP: 2 mg/dL — ABNORMAL HIGH (ref <1.0)

## 2018-11-30 LAB — D-DIMER, QUANTITATIVE: D-Dimer, Quant: 1.99 ug/mL-FEU — ABNORMAL HIGH (ref 0.00–0.50)

## 2018-11-30 MED ORDER — PREDNISONE 10 MG PO TABS: ORAL_TABLET | ORAL | 0 refills | Status: DC

## 2018-11-30 MED ORDER — ALBUTEROL SULFATE HFA 108 (90 BASE) MCG/ACT IN AERS: 2.0000 | INHALATION_SPRAY | Freq: Four times a day (QID) | RESPIRATORY_TRACT | Status: DC | PRN

## 2018-11-30 NOTE — Progress Notes (Signed)
Kitty updated about pt d/c to SNF today

## 2018-11-30 NOTE — Discharge Summary (Signed)
PATIENT DETAILS Name: Shaun Hobbs Age: 85 y.o. Sex: male Date of Birth: 21-Mar-1928 MRN: [id number]. Admitting Physician: Barb Merino, MD [id number], No Pcp Per  Admit Date: 11/25/2018 Discharge date: 11/30/2018  Recommendations for Outpatient Follow-up:  1. Follow up with PCP in 1-2 weeks 2. Please obtain CMP/CBC in one week 3. Repeat Chest Xray in 4-6 week 4. Please ensure follow up with hematology-Dr Irene Limbo  Admitted From:  Home  Disposition: SNF   Home Health: No  Equipment/Devices: None  Discharge Condition: Stable  CODE STATUS: DNR  Diet recommendation:  Diet Order            Diet - low sodium heart healthy        Diet regular Room service appropriate? Yes; Fluid consistency: Thin  Diet effective now               Brief Summary: See H&P, Labs, Consult and Test reports for all details in brief, Patient is a 85 y.o. male with PMHx of dementia, transfusion dependent MDS, HLD, hypothyroidism, BPH presented to the ED on 10/18 with cough, shortness of breath and fever-he was recently diagnosed with COVID-19 a few days prior to this presentation.  Upon further evaluation he was found to have  COVID-19 viral pneumonia.  See below for further details  Brief Hospital Course: Covid 19 Viral pneumonia with superimposed bacterial pneumonia: Improved-no fever-not on oxygen. Has completed 5 days of rocephin, and 5 days of remdesivir. Will taper steroids over the next few days. Stable for discharge  Blood cultures remain negative.   COVID-19 Labs:  Recent Labs    11/28/18 0537 11/29/18 0523 11/30/18 0017  DDIMER 2.39* 1.78* 1.99*  FERRITIN 1,466* 1,207* 1,161*  CRP 4.2* 2.4* 2.0*    Lab Results  Component Value Date   SARSCOV2NAA POSITIVE (A) 11/25/2018     COVID-19 Medications: Steroids: 10/18>> Remdesivir: 10/19>> Actemra: Not a candidate given severe thrombocytopenia at baseline Convalescent Plasma: Not indicated as stable disease-can consider  if patient worsens. 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  Counts are slightly better than usual baseline. Follow CBC.   Reviewed outpatient hematology note-appears patient gets transfusion every 2 weeks. Please ensure outpatient follow up with hematology-and repeat CBC in 1 week  Hypothyroidism: Continue Synthroid  Dementia: Pleasantly confused-at risk of delirium during this hospital stay.  Continue Zoloft and Aricept.  BPH: Continue with Flomax  Procedures/Studies: None  Discharge Diagnoses:  Principal Problem:   Pneumonia due to COVID-19 virus Active Problems:   Myelodysplasia (myelodysplastic syndrome) (HCC)   Dementia without behavioral disturbance [facility])   Discharge Instructions:    Person Under Monitoring Name: WRANGLER KLEVEN  Location: [address] [place] [address]   Infection Prevention Recommendations for Individuals Confirmed to have, or Being Evaluated for, 2019 Novel Coronavirus (COVID-19) Infection Who Receive Care at Home  Individuals who are confirmed to have, or are being evaluated for, COVID-19 should follow the prevention steps below until a healthcare provider or local or state health department says they can return to normal activities.  Stay home except to get medical care You should restrict activities outside your home, except for getting medical care. Do not go to work, school, or public areas, and do not use public transportation or taxis.  Call ahead before visiting your doctor Before your medical appointment, call the  healthcare provider and tell them that you have, or are being evaluated for, COVID-19 infection. This will help the healthcare providers office take steps to keep other people from getting  infected. Ask your healthcare provider to call the local or state health department.  Monitor your symptoms Seek prompt medical attention if your illness is worsening (e.g., difficulty breathing). Before going to your medical appointment, call the healthcare provider and tell them that you have, or are being evaluated for, COVID-19 infection. Ask your healthcare provider to call the local or state health department.  Wear a facemask You should wear a facemask that covers your nose and mouth when you are in the same room with other people and when you visit a healthcare provider. People who live with or visit you should also wear a facemask while they are in the same room with you.  Separate yourself from other people in your home As much as possible, you should stay in a different room from other people in your home. Also, you should use a separate bathroom, if available.  Avoid sharing household items You should not share dishes, drinking glasses, cups, eating utensils, towels, bedding, or other items with other people in your home. After using these items, you should wash them thoroughly with soap and water.  Cover your coughs and sneezes Cover your mouth and nose with a tissue when you cough or sneeze, or you can cough or sneeze into your sleeve. Throw used tissues in a lined trash can, and immediately wash your hands with soap and water for at least 20 seconds or use an alcohol-based hand rub.  Wash your [facility] your hands often and thoroughly with soap and water for at least 20 seconds. You can use an alcohol-based hand sanitizer if soap and water are not available and if your hands are not visibly dirty. Avoid touching your eyes, nose, and mouth with unwashed hands.   Prevention Steps for Caregivers and Household Members of Individuals Confirmed to have, or Being Evaluated for, COVID-19 Infection Being Cared for in the Home  If you live with, or provide care at home for, a  person confirmed to have, or being evaluated for, COVID-19 infection please follow these guidelines to prevent infection:  Follow healthcare providers instructions Make sure that you understand and can help the patient follow any healthcare provider instructions for all care.  Provide for the patients basic needs You should help the patient with basic needs in the home and provide support for getting groceries, prescriptions, and other personal needs.  Monitor the patients symptoms If they are getting sicker, call his or her medical provider and tell them that the patient has, or is being evaluated for, COVID-19 infection. This will help the healthcare providers office take steps to keep other people from getting infected. Ask the healthcare provider to call the local or state health department.  Limit the number of people who have contact with the patient  If possible, have only one caregiver for the patient.  Other household members should stay in another home or place of residence. If this is not possible, they should stay  in another room, or be separated from the patient as much as possible. Use a separate bathroom, if available.  Restrict visitors who do not have an essential need to be in the home.  Keep older adults, very young children, and other sick people away from the patient Keep older adults, very young children, and those who have compromised immune  systems or chronic health conditions away from the patient. This includes people with chronic heart, lung, or kidney conditions, diabetes, and cancer.  Ensure good ventilation Make sure that shared spaces in the home have good air flow, such as from an air conditioner or an opened window, weather permitting.  Wash your hands often  Wash your hands often and thoroughly with soap and water for at least 20 seconds. You can use an alcohol based hand sanitizer if soap and water are not available and if your hands are not  visibly dirty.  Avoid touching your eyes, nose, and mouth with unwashed hands.  Use disposable paper towels to dry your hands. If not available, use dedicated cloth towels and replace them when they become wet.  Wear a facemask and gloves  Wear a disposable facemask at all times in the room and gloves when you touch or have contact with the patients blood, body fluids, and/or secretions or excretions, such as sweat, saliva, sputum, nasal mucus, vomit, urine, or feces.  Ensure the mask fits over your nose and mouth tightly, and do not touch it during use.  Throw out disposable facemasks and gloves after using them. Do not reuse.  Wash your hands immediately after removing your facemask and gloves.  If your personal clothing becomes contaminated, carefully remove clothing and launder. Wash your hands after handling contaminated clothing.  Place all used disposable facemasks, gloves, and other waste in a lined container before disposing them with other household waste.  Remove gloves and wash your hands immediately after handling these items.  Do not share dishes, glasses, or other household items with the patient  Avoid sharing household items. You should not share dishes, drinking glasses, cups, eating utensils, towels, bedding, or other items with a patient who is confirmed to have, or being evaluated for, COVID-19 infection.  After the person uses these items, you should wash them thoroughly with soap and water.  Wash laundry thoroughly  Immediately remove and wash clothes or bedding that have blood, body fluids, and/or secretions or excretions, such as sweat, saliva, sputum, nasal mucus, vomit, urine, or feces, on them.  Wear gloves when handling laundry from the patient.  Read and follow directions on labels of laundry or clothing items and detergent. In general, wash and dry with the warmest temperatures recommended on the label.  Clean all areas the individual has used  often  Clean all touchable surfaces, such as counters, tabletops, doorknobs, bathroom fixtures, toilets, phones, keyboards, tablets, and bedside tables, every day. Also, clean any surfaces that may have blood, body fluids, and/or secretions or excretions on them.  Wear gloves when cleaning surfaces the patient has come in contact with.  Use a diluted bleach solution (e.g., dilute bleach with 1 part bleach and 10 parts water) or a household disinfectant with a label that says EPA-registered for coronaviruses. To make a bleach solution at home, add 1 tablespoon of bleach to 1 quart (4 cups) of water. For a larger supply, add  cup of bleach to 1 gallon (16 cups) of water.  Read labels of cleaning products and follow recommendations provided on product labels. Labels contain instructions for safe and effective use of the cleaning product including precautions you should take when applying the product, such as wearing gloves or eye protection and making sure you have good ventilation during use of the product.  Remove gloves and wash hands immediately after cleaning.  Monitor yourself for signs and symptoms of illness Caregivers and household  members are considered close contacts, should monitor their health, and will be asked to limit movement outside of the home to the extent possible. Follow the monitoring steps for close contacts listed on the symptom monitoring form.   ? If you have additional questions, contact your local health department or call the epidemiologist on call at [phone] (available 24/7). ? This guidance is subject to change. For the most up-to-date guidance from CDC, please refer to their website: YouBlogs.pl    Activity:  As tolerated   Discharge Instructions    Call MD for:  difficulty breathing, headache or visual disturbances   Complete by: As directed    Call MD for:  persistant nausea and vomiting    Complete by: As directed    Call MD for:  temperature >100.4   Complete by: As directed    Diet - low sodium heart healthy   Complete by: As directed    Discharge instructions   Complete by: As directed    Follow with Primary MD  in 1-2 weeks  Follow with Hematologist-Dr Irene Limbo at  cancer  in 1 week  Please get a complete blood count and chemistry panel checked by your Primary MD at your next visit, and again as instructed by your Primary MD.  Get Medicines reviewed and adjusted: Please take all your medications with you for your next visit with your Primary MD  Laboratory/radiological data: Please request your Primary MD to go over all hospital tests and procedure/radiological results at the follow up, please ask your Primary MD to get all Hospital records sent to his/her office.  In some cases, they will be blood work, cultures and biopsy results pending at the time of your discharge. Please request that your primary care M.D. follows up on these results.  Also Note the following: If you experience worsening of your admission symptoms, develop shortness of breath, life threatening emergency, suicidal or homicidal thoughts you must seek medical attention immediately by calling 911 or calling your MD immediately  if symptoms less severe.  You must read complete instructions/literature along with all the possible adverse reactions/side effects for all the Medicines you take and that have been prescribed to you. Take any new Medicines after you have completely understood and accpet all the possible adverse reactions/side effects.   Do not drive when taking Pain medications or sleeping medications (Benzodaizepines)  Do not take more than prescribed Pain, Sleep and Anxiety Medications. It is not advisable to combine anxiety,sleep and pain medications without talking with your primary care practitioner  Special Instructions: If you have smoked or chewed Tobacco  in the last 2 yrs  please stop smoking, stop any regular Alcohol  and or any Recreational drug use.  Wear Seat belts while driving.  Please note: You were cared for by a hospitalist during your hospital stay. Once you are discharged, your primary care physician will handle any further medical issues. Please note that NO REFILLS for any discharge medications will be authorized once you are discharged, as it is imperative that you return to your primary care physician (or establish a relationship with a primary care physician if you do not have one) for your post hospital discharge needs so that they can reassess your need for medications and monitor your lab values.   Increase activity slowly   Complete by: As directed      Allergies as of 11/30/2018      Reactions   Sulfa Antibiotics Other (See Comments)  Patient reports: reaction in past  Patient reports: reaction in past       Medication List    STOP taking these medications   azithromycin 250 MG tablet Commonly known as: ZITHROMAX     TAKE these medications   albuterol 108 (90 Base) MCG/ACT inhaler Commonly known as: VENTOLIN HFA Inhale 2 puffs into the lungs every 6 (six) hours as needed for wheezing or shortness of breath.   atorvastatin 10 MG tablet Commonly known as: LIPITOR Take 5 mg by mouth daily.   donepezil 10 MG tablet Commonly known as: ARICEPT Take 10 mg by mouth at bedtime.   levothyroxine 100 MCG tablet Commonly known as: SYNTHROID Take 100 mcg by mouth daily before breakfast.   MAGNESIUM PO Take 1 tablet by mouth daily.   predniSONE 10 MG tablet Commonly known as: DELTASONE Take 40 mg daily for 1 day, 30 mg daily for 1 day, 20 mg daily for 1 days,10 mg daily for 1 day, then stop   sertraline 100 MG tablet Commonly known as: ZOLOFT Take 100 mg by mouth at bedtime.   tamsulosin 0.4 MG Caps capsule Commonly known as: FLOMAX Take 0.4 mg by mouth at bedtime.   VITAMIN D3 PO Take by mouth daily.      Follow-up  Information    Brunetta Genera, MD. Schedule an appointment as soon as possible for a visit in 1 week(s).   Specialties: Hematology, Oncology Contact information: [place] [address] [phone]        primary care MD. Schedule an appointment as soon as possible for a visit in 1 week(s).          Allergies  Allergen Reactions   Sulfa Antibiotics Other (See Comments)    Patient reports: reaction in past  Patient reports: reaction in past     Consultations:   None  Other Procedures/Studies: Dg Chest Portable 1 View  Result Date: 11/25/2018 CLINICAL DATA:  Headache, fever, COVID-19 EXAM: PORTABLE CHEST 1 VIEW COMPARISON:  None. FINDINGS: Patchy right lower lobe opacity, suspicious for pneumonia. Mild left basilar opacity, possibly atelectasis. No pleural effusion or pneumothorax. Cardiomegaly. IMPRESSION: Right lower lobe pneumonia in this patient with known COVID. Electronically Signed   By: Julian Hy M.D.   On: 11/25/2018 16:19     TODAY-DAY OF DISCHARGE:  Subjective:   [name] today has no headache,no chest abdominal pain,no new weakness tingling or numbness, feels much better wants to go home today.  Objective:   Blood pressure 122/65, pulse 67, temperature 98.5 F (36.9 C), temperature source Oral, resp. rate 18, height 5' 10.5" (1.791 m), weight 72.9 kg, SpO2 96 %.  Intake/Output Summary (Last 24 hours) at 11/30/2018 1000 Last data filed at 11/30/2018 0857 Gross per 24 hour  Intake 1020 ml  Output --  Net 1020 ml   Filed Weights   11/26/18 1236  Weight: 72.9 kg    Exam: Awake Alert, Oriented *3, No new F.N deficits, Normal affect Bruce.AT,PERRAL Supple Neck,No JVD, No cervical lymphadenopathy appriciated.  Symmetrical Chest wall movement, Good air movement bilaterally, CTAB RRR,No Gallops,Rubs or new Murmurs, No Parasternal Heave +ve B.Sounds, Abd Soft, Non tender, No organomegaly appriciated, No rebound  -guarding or rigidity. No Cyanosis, Clubbing or edema, No new Rash or bruise   PERTINENT RADIOLOGIC STUDIES: Dg Chest Portable 1 View  Result Date: 11/25/2018 CLINICAL DATA:  Headache, fever, COVID-19 EXAM: PORTABLE CHEST 1 VIEW COMPARISON:  None. FINDINGS: Patchy right lower lobe opacity,  suspicious for pneumonia. Mild left basilar opacity, possibly atelectasis. No pleural effusion or pneumothorax. Cardiomegaly. IMPRESSION: Right lower lobe pneumonia in this patient with known COVID. Electronically Signed   By: Julian Hy M.D.   On: 11/25/2018 16:19     PERTINENT LAB RESULTS: CBC: Recent Labs    11/29/18 0523 11/30/18 0017  WBC 2.3* 3.0*  HGB 7.4* 7.7*  HCT 24.1* 24.8*  PLT 14* 15*   CMET CMP     Component Value Date/Time   NA 136 11/30/2018 0017   K 3.9 11/30/2018 0017   CL 106 11/30/2018 0017   CO2 22 11/30/2018 0017   GLUCOSE 173 (H) 11/30/2018 0017   BUN 41 (H) 11/30/2018 0017   CREATININE 0.89 11/30/2018 0017   CREATININE 0.91 11/13/2018 0858   CALCIUM 8.1 (L) 11/30/2018 0017   PROT 5.5 (L) 11/30/2018 0017   ALBUMIN 3.2 (L) 11/30/2018 0017   AST 38 11/30/2018 0017   AST 13 (L) 11/13/2018 0858   ALT 40 11/30/2018 0017   ALT 7 11/13/2018 0858   ALKPHOS 38 11/30/2018 0017   BILITOT 0.5 11/30/2018 0017   BILITOT 0.6 11/13/2018 0858   GFRNONAA >60 11/30/2018 0017   GFRNONAA >60 11/13/2018 0858   GFRAA >60 11/30/2018 0017   GFRAA >60 11/13/2018 0858    GFR Estimated Creatinine Clearance: 60.3 mL/min (by C-G formula based on SCr of 0.89 mg/dL). No results for input(s): LIPASE, AMYLASE in the last 72 hours. No results for input(s): CKTOTAL, CKMB, CKMBINDEX, TROPONINI in the last 72 hours. Invalid input(s): POCBNP Recent Labs    11/29/18 0523 11/30/18 0017  DDIMER 1.78* 1.99*   No results for input(s): HGBA1C in the last 72 hours. No results for input(s): CHOL, HDL, LDLCALC, TRIG, CHOLHDL, LDLDIRECT in the last 72 hours. No results for input(s):  TSH, T4TOTAL, T3FREE, THYROIDAB in the last 72 hours.  Invalid input(s): FREET3 Recent Labs    11/29/18 0523 11/30/18 0017  FERRITIN 1,207* 1,161*   Coags: No results for input(s): INR in the last 72 hours.  Invalid input(s): PT Microbiology: Recent Results (from the past 240 hour(s))  SARS CORONAVIRUS 2 (TAT 6-24 HRS) Nasopharyngeal Nasopharyngeal Swab     Status: Abnormal   Collection Time: 11/25/18  3:16 PM   Specimen: Nasopharyngeal Swab  Result Value Ref Range Status   SARS Coronavirus 2 POSITIVE (A) NEGATIVE Final    Comment: RESULT CALLED TO, READ BACK BY AND VERIFIED WITH: L.WHITE RN [id number] 11/26/2018 MCCORMICK K (NOTE) SARS-CoV-2 target nucleic acids are DETECTED. The SARS-CoV-2 RNA is generally detectable in upper and lower respiratory specimens during the acute phase of infection. Positive results are indicative of active infection with SARS-CoV-2. Clinical  correlation with patient history and other diagnostic information is necessary to determine patient infection status. Positive results do  not rule out bacterial infection or co-infection with other viruses. The expected result is Negative. Fact Sheet for Patients: SugarRoll.be Fact Sheet for Healthcare Providers: https://www.woods-mathews.com/ This test is not yet approved or cleared by the [place] FDA and  has been authorized for detection and/or diagnosis of SARS-CoV-2 by FDA under an Emergency Use Authorization (EUA). This EUA will remain  in effect (meaning this test can be used) fo r the duration of the COVID-19 declaration under Section 564(b)(1) of the Act, 21 U.S.C. section 360bbb-3(b)(1), unless the authorization is terminated or revoked sooner. Performed at [facility], [place] [address] [place], [place] [address]   Blood Culture (routine x 2)  Status: None   Collection Time: 11/25/18  3:23 PM   Specimen: BLOOD  Result Value Ref Range Status    Specimen Description   Final    BLOOD WRIST RIGHT Performed at [facility] [address] [place], [place] [address]    Special Requests   Final    BOTTLES DRAWN AEROBIC AND ANAEROBIC Blood Culture results may not be optimal due to an excessive volume of blood received in culture bottles Performed at [facility] [address], [place], [place] [address]    Culture   Final    NO GROWTH 5 DAYS Performed at [facility], [place] [address], [place], [place] [address]    Report Status 11/30/2018 FINAL  Final  Blood Culture (routine x 2)     Status: None   Collection Time: 11/25/18  3:23 PM   Specimen: BLOOD  Result Value Ref Range Status   Specimen Description   Final    BLOOD WRIST LEFT Performed at [facility] [address] [place], [place] [address]    Special Requests   Final    BOTTLES DRAWN AEROBIC AND ANAEROBIC Blood Culture adequate volume Performed at [facility] [address], [place], [place] [address]    Culture   Final    NO GROWTH 5 DAYS Performed at [facility], [place] [address], [place], [place] [address]    Report Status 11/30/2018 FINAL  Final    FURTHER DISCHARGE INSTRUCTIONS:  Get Medicines reviewed and adjusted: Please take all your medications with you for your next visit with your Primary MD  Laboratory/radiological data: Please request your Primary MD to go over all hospital tests and procedure/radiological results at the follow up, please ask your Primary MD to get all Hospital records sent to his/her office.  In some cases, they will be blood work, cultures and biopsy results pending at the time of your discharge. Please request that your primary care M.D. goes through all the records of your hospital data and follows up on these results.  Also Note the following: If you experience worsening of your admission symptoms, develop shortness of breath, life  threatening emergency, suicidal or homicidal thoughts you must seek medical attention immediately by calling 911 or calling your MD immediately  if symptoms less severe.  You must read complete instructions/literature along with all the possible adverse reactions/side effects for all the Medicines you take and that have been prescribed to you. Take any new Medicines after you have completely understood and accpet all the possible adverse reactions/side effects.   Do not drive when taking Pain medications or sleeping medications (Benzodaizepines)  Do not take more than prescribed Pain, Sleep and Anxiety Medications. It is not advisable to combine anxiety,sleep and pain medications without talking with your primary care practitioner  Special Instructions: If you have smoked or chewed Tobacco  in the last 2 yrs please stop smoking, stop any regular Alcohol  and or any Recreational drug use.  Wear Seat belts while driving.  Please note: You were cared for by a hospitalist during your hospital stay. Once you are discharged, your primary care physician will handle any further medical issues. Please note that NO REFILLS for any discharge medications will be authorized once you are discharged, as it is imperative that you return to your primary care physician (or establish a relationship with a primary care physician if you do not have one) for your post hospital  discharge needs so that they can reassess your need for medications and monitor your lab values.  Total Time spent coordinating discharge including counseling, education and face to face time equals 35 minutes.  Signed: Adelfa Lozito 11/30/2018 10:00 AM

## 2018-11-30 NOTE — TOC Transition Note (Signed)
Transition of Care [facility]) - CM/SW Discharge Note   Patient Details  Name: Shaun Hobbs MRN: [id number] Date of Birth: Jun 13, 1928  Transition of Care [facility]) CM/SW Contact:  Ninfa Meeker, RN Phone Number: [phone] (working remotely) 11/30/2018, 11:08 AM   Clinical Narrative:     Patient will DC to: [place] date: 11/30/18 Family notified: Son: Darden Dates Transport by: Corey Harold : 11:30am  Per MD patient ready for discharge to [facility]. Camera operator, patient's family and facility notified of discharge. Discharge summary and FL2 faxed to [facility] RN to call report to [phone], patient going to [place], [employer]. [facility] has been requested for 12N transport. Charge nurse notified.    Final next level of care: (P) Skilled Nursing Facility Barriers to Discharge: (P) No Barriers Identified   Patient Goals and CMS Choice Patient states their goals for this hospitalization and ongoing recovery are:: (P) per family to get better   Choice offered to / list presented to : (P) Spouse, Adult Children  Discharge Placement                       Discharge Plan and Services     Post Acute Care Choice: (P) [place]            DME Agency: (P) [facility] (SDOH) Interventions     Readmission Risk Interventions No flowsheet data found.

## 2018-11-30 NOTE — Progress Notes (Addendum)
Report given to Sharyn Lull, LPN at [facility]

## 2018-11-30 NOTE — Progress Notes (Signed)
Pt d/c to SNF via [facility] with belongings (2 shirts, coat, glasses). IV's removed. Family updated. Report called.

## 2018-12-06 ENCOUNTER — Telehealth: Payer: Self-pay | Admitting: *Deleted

## 2018-12-06 NOTE — Telephone Encounter (Signed)
Son - Whit Waldroup - called. Father spent 6 days in [facility] following diagnosis of coronavirus on 10/18. Was transferred to [facility] 10/23 (son states only place in area to take Covid + patients for rehab).  Father was being considered for discharge home today. Son was called by NP at [facility] this morning: father weaker and O2 sats around 85% after ambulating approx 15 ft. Patient also coughed up blood today.  Son stated he knows his father probably should have blood tests, but wasn't sure how that would happen. Advised him that NP at [facility] would be their contact for concerns at this time as patient is currently admitted there. Advised him that having blood test may be possible while at [facility], again advising him to speak with NP. Encouraged him to contact this office with updates about patient. [facility] currently asking patients who have tested positive for Covid to wait until 21 days have passed before being seen at [facility] - providing the last 3 days are symptom/fever free without medications. Will inform Dr.Kale of all information to determine when to schedule appt for patient.

## 2018-12-07 NOTE — Telephone Encounter (Signed)
Contacted pt son to let him know that per Dr.Kale, once father is d/c'd from rehab, an appt for lab and possible blood transfusion can be scheduled. Shaun Hobbs verbalized understanding and stated he was anticipating his father's discharge being next week - he will contact them to ask if they can check father's hgb and order blood and/or send to [facility] ED for blood if indicated.  Son will contact Dr.Kale's office to schedule lab/transfusion appt once father is d/c'd.

## 2018-12-11 ENCOUNTER — Other Ambulatory Visit: Payer: Medicare Other

## 2018-12-11 ENCOUNTER — Ambulatory Visit: Payer: Medicare Other

## 2018-12-11 ENCOUNTER — Ambulatory Visit: Payer: Medicare Other | Admitting: Hematology

## 2018-12-12 ENCOUNTER — Encounter (HOSPITAL_COMMUNITY): Payer: Self-pay | Admitting: *Deleted

## 2018-12-12 ENCOUNTER — Emergency Department (HOSPITAL_COMMUNITY)
Admission: EM | Admit: 2018-12-12 | Discharge: 2018-12-12 | Disposition: A | Discharge status: Home / Self Care | Payer: Medicare Other | Attending: Emergency Medicine | Admitting: Emergency Medicine

## 2018-12-12 ENCOUNTER — Emergency Department (HOSPITAL_COMMUNITY): Payer: Medicare Other

## 2018-12-12 ENCOUNTER — Other Ambulatory Visit: Payer: Self-pay

## 2018-12-12 DIAGNOSIS — F0390 Unspecified dementia without behavioral disturbance: Secondary | ICD-10-CM | POA: Diagnosis not present

## 2018-12-12 DIAGNOSIS — Z79899 Other long term (current) drug therapy: Secondary | ICD-10-CM | POA: Insufficient documentation

## 2018-12-12 DIAGNOSIS — R531 Weakness: Secondary | ICD-10-CM | POA: Insufficient documentation

## 2018-12-12 DIAGNOSIS — E039 Hypothyroidism, unspecified: Secondary | ICD-10-CM | POA: Diagnosis not present

## 2018-12-12 DIAGNOSIS — D469 Myelodysplastic syndrome, unspecified: Secondary | ICD-10-CM | POA: Diagnosis not present

## 2018-12-12 HISTORY — DX: Anemia, unspecified: D64.9

## 2018-12-12 LAB — URINALYSIS, ROUTINE W REFLEX MICROSCOPIC
Bilirubin Urine: NEGATIVE
Glucose, UA: NEGATIVE mg/dL
Hgb urine dipstick: NEGATIVE
Ketones, ur: 5 mg/dL — AB
Nitrite: NEGATIVE
Protein, ur: 30 mg/dL — AB
Specific Gravity, Urine: 1.026 (ref 1.005–1.030)
pH: 6 (ref 5.0–8.0)

## 2018-12-12 LAB — CBC WITH DIFFERENTIAL/PLATELET
Abs Immature Granulocytes: 0.03 10*3/uL (ref 0.00–0.07)
Basophils Absolute: 0 10*3/uL (ref 0.0–0.1)
Basophils Relative: 0 %
Eosinophils Absolute: 0 10*3/uL (ref 0.0–0.5)
Eosinophils Relative: 0 %
HCT: 25.6 % — ABNORMAL LOW (ref 39.0–52.0)
Hemoglobin: 7.6 g/dL — ABNORMAL LOW (ref 13.0–17.0)
Immature Granulocytes: 1 %
Lymphocytes Relative: 16 %
Lymphs Abs: 0.5 10*3/uL — ABNORMAL LOW (ref 0.7–4.0)
MCH: 30.2 pg (ref 26.0–34.0)
MCHC: 29.7 g/dL — ABNORMAL LOW (ref 30.0–36.0)
MCV: 101.6 fL — ABNORMAL HIGH (ref 80.0–100.0)
Monocytes Absolute: 0.4 10*3/uL (ref 0.1–1.0)
Monocytes Relative: 11 %
Neutro Abs: 2.4 10*3/uL (ref 1.7–7.7)
Neutrophils Relative %: 72 %
Platelets: 17 10*3/uL — CL (ref 150–400)
RBC: 2.52 MIL/uL — ABNORMAL LOW (ref 4.22–5.81)
RDW: 23.6 % — ABNORMAL HIGH (ref 11.5–15.5)
WBC: 3.3 10*3/uL — ABNORMAL LOW (ref 4.0–10.5)
nRBC: 0 % (ref 0.0–0.2)

## 2018-12-12 LAB — PROTIME-INR
INR: 1.3 — ABNORMAL HIGH (ref 0.8–1.2)
Prothrombin Time: 16.2 seconds — ABNORMAL HIGH (ref 11.4–15.2)

## 2018-12-12 LAB — BASIC METABOLIC PANEL
Anion gap: 8 (ref 5–15)
BUN: 28 mg/dL — ABNORMAL HIGH (ref 8–23)
CO2: 22 mmol/L (ref 22–32)
Calcium: 8.3 mg/dL — ABNORMAL LOW (ref 8.9–10.3)
Chloride: 108 mmol/L (ref 98–111)
Creatinine, Ser: 0.89 mg/dL (ref 0.61–1.24)
GFR calc Af Amer: >60 mL/min (ref >60)
GFR calc non Af Amer: >60 mL/min (ref >60)
Glucose, Bld: 109 mg/dL — ABNORMAL HIGH (ref 70–99)
Potassium: 3.6 mmol/L (ref 3.5–5.1)
Sodium: 138 mmol/L (ref 135–145)

## 2018-12-12 LAB — TYPE AND SCREEN
ABO/RH(D): A POS
Antibody Screen: NEGATIVE

## 2018-12-12 LAB — BRAIN NATRIURETIC PEPTIDE: B Natriuretic Peptide: 95.2 pg/mL (ref 0.0–100.0)

## 2018-12-12 NOTE — ED Triage Notes (Signed)
Pt coming from [facility].  Pt had blood work completed and was found to have "anemia, thrombocytopenia" in his blood work. Pt is positive for orthostatic BPs at the facility. Pt denies any pain to EMS. Pt a/o x 4 but appears confused.   Pt is seen by Dr. Irene Limbo for bi-weekly blood transfusion.

## 2018-12-12 NOTE — ED Triage Notes (Signed)
Per EMS: Pt is coming from [place] and rehab. Patient reportedly has been testing negative for COVID since October 23rd. Patient was sent out for weakness and anemia. Pt has a hx of dementia.

## 2018-12-12 NOTE — ED Notes (Addendum)
Facility notified of pt returning.

## 2018-12-12 NOTE — Discharge Instructions (Addendum)
Recommend that rehab facility discuss the patient's rehabilitation progress and plan in detail with the family.  The family expressed their desire to pursue options for rehab in the home and pursue early discharge from the facility.  Please discuss this with family.  Recommend following up with Dr. Irene Limbo once patient is discharged from the rehab facility.  If he develops fever, difficulty in breathing, chest pain, other new concerning symptom recommend return to ER for recheck.

## 2018-12-12 NOTE — ED Provider Notes (Signed)
[name]   CSN: [id number] Arrival date & time: 12/12/18  1453     History   Chief Complaint Chief Complaint  Patient presents with  . Anemia    HPI Shaun Hobbs is a 85 y.o. male.  Presents emerge department with chief complaint of anemia.  Patient was recently admitted to the hospital for COVID-19.  Discharged to [place] and rehab.  Patient reports that he has not had any acute change in his symptoms but has felt generally weak for the past few days.  Wife at bedside states that patient seems to have significant decrease in his energy.  Patient denies any shortness of breath, no chest pain or difficulty breathing.  No abdominal pain, vomiting.  No fevers.  Reportedly had a negative Covid test at [facility].  Past medical history of myelodysplastic syndrome.  Receives periodic transfusions for blood and platelets.     HPI  Past Medical History:  Diagnosis Date  . Anemia   . Cancer ([place]) 2008   Myelodysplastic syndrome  . Dementia ([place])   . Hypothyroidism     Patient Active Problem List   Diagnosis Date Noted  . Pneumonia due to COVID-19 virus 11/25/2018  . Myelodysplasia (myelodysplastic syndrome) ([place]) 11/25/2018  . Dementia without behavioral disturbance ([place]) 11/25/2018    History reviewed. No pertinent surgical history.      Home Medications    Prior to Admission medications   Medication Sig Start Date End Date Taking? Authorizing Provider  acidophilus (RISAQUAD) CAPS capsule Take 1 capsule by mouth 2 (two) times daily.   Yes [provider]  albuterol (VENTOLIN HFA) 108 (90 Base) MCG/ACT inhaler Inhale 2 puffs into the lungs every 6 (six) hours as needed for wheezing or shortness of breath. 11/30/18  Yes Ghimire, Henreitta Leber, MD  amoxicillin-clavulanate (AUGMENTIN) 875-125 MG tablet Take 1 tablet by mouth 2 (two) times daily.   Yes [provider]  ascorbic acid (VITAMIN C) 1000 MG  tablet Take 1,000 mg by mouth daily.   Yes [provider]  atorvastatin (LIPITOR) 10 MG tablet Take 5 mg by mouth daily.   Yes [provider]  Cholecalciferol (VITAMIN D3 PO) Take 1,000 Units by mouth daily.    Yes [provider]  donepezil (ARICEPT) 10 MG tablet Take 10 mg by mouth at bedtime.   Yes [provider]  guaiFENesin (MUCINEX) 600 MG 12 hr tablet Take 600 mg by mouth 2 (two) times daily.   Yes [provider]  levothyroxine (SYNTHROID, LEVOTHROID) 100 MCG tablet Take 100 mcg by mouth daily before breakfast.   Yes [provider]  MAGNESIUM PO Take 400 mg by mouth daily.    Yes [provider]  sertraline (ZOLOFT) 100 MG tablet Take 100 mg by mouth at bedtime.   Yes [provider]  tamsulosin (FLOMAX) 0.4 MG CAPS capsule Take 0.4 mg by mouth at bedtime.   Yes [provider]  zinc sulfate 220 (50 Zn) MG capsule Take 220 mg by mouth daily.   Yes [provider]  predniSONE (DELTASONE) 10 MG tablet Take 40 mg daily for 1 day, 30 mg daily for 1 day, 20 mg daily for 1 days,10 mg daily for 1 day, then stop Patient not taking: Reported on 12/12/2018 11/30/18   Jonetta Osgood, MD    Family History No family history on file.  Social History Social History   Tobacco Use  . Smoking status:  Never Smoker  . Smokeless tobacco: Never Used  Substance Use Topics  . Alcohol use: Not Currently  . Drug use: Never     Allergies   Sulfa antibiotics   Review of Systems Review of Systems  Constitutional: Positive for fatigue. Negative for chills and fever.  HENT: Negative for ear pain and sore throat.   Eyes: Negative for pain and visual disturbance.  Respiratory: Negative for cough and shortness of breath.   Cardiovascular: Negative for chest pain and palpitations.  Gastrointestinal: Negative for abdominal pain and vomiting.  Genitourinary: Negative for dysuria and hematuria.   Musculoskeletal: Negative for arthralgias and back pain.  Skin: Negative for color change and rash.  Neurological: Negative for seizures and syncope.  All other systems reviewed and are negative.    Physical Exam Updated Vital Signs BP 123/73 (BP Location: Right Arm)   Pulse 84   Temp 98.6 F (37 C) (Oral)   Resp 16   SpO2 94% Comment: RA  Physical Exam Vitals signs and nursing note reviewed.  Constitutional:      Appearance: He is well-developed.     Comments: Elderly, frail but no acute distress  HENT:     Head: Normocephalic and atraumatic.  Eyes:     Conjunctiva/sclera: Conjunctivae normal.  Neck:     Musculoskeletal: Neck supple.  Cardiovascular:     Rate and Rhythm: Normal rate and regular rhythm.     Heart sounds: No murmur.  Pulmonary:     Effort: Pulmonary effort is normal. No respiratory distress.     Breath sounds: Normal breath sounds.  Abdominal:     Palpations: Abdomen is soft.     Tenderness: There is no abdominal tenderness.  Skin:    General: Skin is warm and dry.     Capillary Refill: Capillary refill takes less than 2 seconds.  Neurological:     General: No focal deficit present.     Mental Status: He is alert and oriented to person, place, and time.  Psychiatric:        Mood and Affect: Mood normal.        Behavior: Behavior normal.      ED Treatments / Results  Labs (all labs ordered are listed, but only abnormal results are displayed) Labs Reviewed  CBC WITH DIFFERENTIAL/PLATELET  BASIC METABOLIC PANEL  PROTIME-INR  BRAIN NATRIURETIC PEPTIDE  TYPE AND SCREEN    EKG None  Radiology No results found.  Procedures Procedures (including critical care time)  Medications Ordered in ED Medications - No data to display   Initial Impression / Assessment and Plan / ED Course  I have reviewed the triage vital signs and the nursing notes.  Pertinent labs & imaging results that were available during my care of the patient were  reviewed by me and considered in my medical decision making (see chart for details).  Clinical Course as of Dec 11 1929  Wed Dec 12, 2018  1923 Discussed with Dr. Lindi Adie -recommends transfusion goal of hemoglobin greater than 7 and platelets greater than 10, recommends outpatient follow-up with Dr. Irene Limbo after discharge from rehab   [RD]  1929 Updated patient, will check UA to rule out UTI, otherwise patient likely okay to go back to facility   [RD]    Clinical Course User Index [RD] Lucrezia Starch, MD       85 year old male history of myelodysplastic syndrome, recent mission for COVID-19 presents to ER with reported abnormal labs and generalized weakness.  Patient  states he has not had any acute changes in his symptoms.  States he has felt generally weak since having COVID-19.  Denies any new difficulty breathing, no syncope, no chest pain, fever.  Facility reported anemia thrombocytopenia however do not have the results.  Patient has history of mild dysplastic syndrome, requiring frequent transfusions in the outpatient setting.  Today his hemoglobin is actually stable at 7.6, platelets at 17.  Is actually improved from when he was discharged.  Reviewed case with his hematology team, patient does not require transfusion at this time.  His vitals remained stable and in no acute distress.  Believe he is stable for discharge back to his rehab facility.  These findings were discussed with his wife at bedside.  Final Clinical Impressions(s) / ED Diagnoses   Final diagnoses:  Weakness  Myelodysplastic syndrome [facility])    ED Discharge Orders    None       Lucrezia Starch, MD 12/12/18 2332

## 2018-12-12 NOTE — ED Notes (Signed)
[facility] called for transport.  

## 2018-12-17 ENCOUNTER — Telehealth: Payer: Self-pay | Admitting: Hematology

## 2018-12-17 NOTE — Telephone Encounter (Signed)
Scheduled appt per 11/9 sch message - pt wife is aware of apt date and time

## 2018-12-18 ENCOUNTER — Other Ambulatory Visit: Payer: Self-pay

## 2018-12-18 ENCOUNTER — Inpatient Hospital Stay: Payer: Medicare Other | Attending: Hematology | Admitting: Hematology

## 2018-12-18 ENCOUNTER — Inpatient Hospital Stay: Payer: Medicare Other

## 2018-12-18 ENCOUNTER — Telehealth: Payer: Self-pay

## 2018-12-18 ENCOUNTER — Telehealth: Payer: Self-pay | Admitting: Hematology

## 2018-12-18 DIAGNOSIS — D469 Myelodysplastic syndrome, unspecified: Secondary | ICD-10-CM | POA: Insufficient documentation

## 2018-12-18 DIAGNOSIS — R319 Hematuria, unspecified: Secondary | ICD-10-CM | POA: Insufficient documentation

## 2018-12-18 DIAGNOSIS — D61818 Other pancytopenia: Secondary | ICD-10-CM

## 2018-12-18 DIAGNOSIS — D649 Anemia, unspecified: Secondary | ICD-10-CM

## 2018-12-18 LAB — CMP (CANCER CENTER ONLY)
ALT: 6 U/L (ref 0–44)
AST: 13 U/L — ABNORMAL LOW (ref 15–41)
Albumin: 3.6 g/dL (ref 3.5–5.0)
Alkaline Phosphatase: 55 U/L (ref 38–126)
Anion gap: 10 (ref 5–15)
BUN: 22 mg/dL (ref 8–23)
CO2: 26 mmol/L (ref 22–32)
Calcium: 8.7 mg/dL — ABNORMAL LOW (ref 8.9–10.3)
Chloride: 103 mmol/L (ref 98–111)
Creatinine: 0.85 mg/dL (ref 0.61–1.24)
GFR, Est AFR Am: >60 mL/min (ref >60)
GFR, Estimated: >60 mL/min (ref >60)
Glucose, Bld: 104 mg/dL — ABNORMAL HIGH (ref 70–99)
Potassium: 4.1 mmol/L (ref 3.5–5.1)
Sodium: 139 mmol/L (ref 135–145)
Total Bilirubin: 0.7 mg/dL (ref 0.3–1.2)
Total Protein: 6.2 g/dL — ABNORMAL LOW (ref 6.5–8.1)

## 2018-12-18 LAB — SAMPLE TO BLOOD BANK

## 2018-12-18 LAB — CBC WITH DIFFERENTIAL/PLATELET
Abs Immature Granulocytes: 0.07 10*3/uL (ref 0.00–0.07)
Basophils Absolute: 0 10*3/uL (ref 0.0–0.1)
Basophils Relative: 0 %
Eosinophils Absolute: 0 10*3/uL (ref 0.0–0.5)
Eosinophils Relative: 0 %
HCT: 25.8 % — ABNORMAL LOW (ref 39.0–52.0)
Hemoglobin: 7.8 g/dL — ABNORMAL LOW (ref 13.0–17.0)
Immature Granulocytes: 2 %
Lymphocytes Relative: 16 %
Lymphs Abs: 0.6 10*3/uL — ABNORMAL LOW (ref 0.7–4.0)
MCH: 30.4 pg (ref 26.0–34.0)
MCHC: 30.2 g/dL (ref 30.0–36.0)
MCV: 100.4 fL — ABNORMAL HIGH (ref 80.0–100.0)
Monocytes Absolute: 0.5 10*3/uL (ref 0.1–1.0)
Monocytes Relative: 14 %
Neutro Abs: 2.4 10*3/uL (ref 1.7–7.7)
Neutrophils Relative %: 68 %
Platelets: 27 10*3/uL — ABNORMAL LOW (ref 150–400)
RBC: 2.57 MIL/uL — ABNORMAL LOW (ref 4.22–5.81)
RDW: 24.4 % — ABNORMAL HIGH (ref 11.5–15.5)
WBC: 3.6 10*3/uL — ABNORMAL LOW (ref 4.0–10.5)
nRBC: 0 % (ref 0.0–0.2)

## 2018-12-18 NOTE — Telephone Encounter (Signed)
Spoke to patient's wife and made her aware of lab and transfusion appointment on 01/01/19 at 11:15 am. She verbalized understanding.

## 2018-12-18 NOTE — Telephone Encounter (Signed)
Dr. Irene Limbo made aware of today's CBC results. Per Dr. Irene Limbo no transfusion is required and patient to return in 2 weeks for lab, MD, transfusion appointments.

## 2018-12-18 NOTE — Telephone Encounter (Signed)
Scheduled appt per 11/10 sch message - spoke with patients wife. Pt is aware of appt date and time

## 2018-12-26 ENCOUNTER — Telehealth: Payer: Self-pay | Admitting: Hematology

## 2018-12-26 NOTE — Telephone Encounter (Signed)
Scheduled appt per 11/10 sch message and [place] staff message. RN Sandi reviewed appts looks correct per [place] . Pt wife if aware of apt date and time.

## 2018-12-27 ENCOUNTER — Telehealth: Payer: Self-pay | Admitting: *Deleted

## 2018-12-27 ENCOUNTER — Other Ambulatory Visit: Payer: Self-pay | Admitting: *Deleted

## 2018-12-27 DIAGNOSIS — D469 Myelodysplastic syndrome, unspecified: Secondary | ICD-10-CM

## 2018-12-27 DIAGNOSIS — D649 Anemia, unspecified: Secondary | ICD-10-CM

## 2018-12-27 NOTE — Telephone Encounter (Signed)
Contacted by Dr. Veronia Beets at [facility]. Patient in his office 11/18 for post hospital f/u. Patient had low b/p at home per  [place]. Labs on 11/17: Plt 7.0, Hgb 7.9 and WBC 2.2.  He knew patient had appt here on 11/24 for lab and blood, but wondered if it should be sooner. Please inform Dr. Irene Limbo  Dr.Kale ordered 1 unit Plt and 1 unit PRBC tomorrow or Saturday Contacted family, appt for lab is at [place]  and infusion(blood/platelets) at [place].

## 2018-12-28 ENCOUNTER — Other Ambulatory Visit: Payer: Self-pay | Admitting: Emergency Medicine

## 2018-12-28 ENCOUNTER — Other Ambulatory Visit: Payer: Self-pay

## 2018-12-28 ENCOUNTER — Inpatient Hospital Stay: Payer: Medicare Other

## 2018-12-28 ENCOUNTER — Other Ambulatory Visit: Payer: Self-pay | Admitting: Medical

## 2018-12-28 ENCOUNTER — Telehealth: Payer: Self-pay

## 2018-12-28 ENCOUNTER — Inpatient Hospital Stay (HOSPITAL_BASED_OUTPATIENT_CLINIC_OR_DEPARTMENT_OTHER): Payer: Medicare Other | Admitting: Medical

## 2018-12-28 DIAGNOSIS — D469 Myelodysplastic syndrome, unspecified: Secondary | ICD-10-CM

## 2018-12-28 DIAGNOSIS — C9000 Multiple myeloma not having achieved remission: Secondary | ICD-10-CM

## 2018-12-28 DIAGNOSIS — D696 Thrombocytopenia, unspecified: Secondary | ICD-10-CM

## 2018-12-28 DIAGNOSIS — D649 Anemia, unspecified: Secondary | ICD-10-CM

## 2018-12-28 DIAGNOSIS — R311 Benign essential microscopic hematuria: Secondary | ICD-10-CM

## 2018-12-28 LAB — URINALYSIS, COMPLETE (UACMP) WITH MICROSCOPIC
Bilirubin Urine: NEGATIVE
Glucose, UA: NEGATIVE mg/dL
Ketones, ur: NEGATIVE mg/dL
Nitrite: NEGATIVE
Protein, ur: NEGATIVE mg/dL
Specific Gravity, Urine: 1.012 (ref 1.005–1.030)
pH: 6 (ref 5.0–8.0)

## 2018-12-28 LAB — CBC WITH DIFFERENTIAL/PLATELET
Abs Immature Granulocytes: 0.04 10*3/uL (ref 0.00–0.07)
Basophils Absolute: 0 10*3/uL (ref 0.0–0.1)
Basophils Relative: 0 %
Eosinophils Absolute: 0 10*3/uL (ref 0.0–0.5)
Eosinophils Relative: 1 %
HCT: 24.7 % — ABNORMAL LOW (ref 39.0–52.0)
Hemoglobin: 7.4 g/dL — ABNORMAL LOW (ref 13.0–17.0)
Immature Granulocytes: 2 %
Lymphocytes Relative: 25 %
Lymphs Abs: 0.5 10*3/uL — ABNORMAL LOW (ref 0.7–4.0)
MCH: 30.7 pg (ref 26.0–34.0)
MCHC: 30 g/dL (ref 30.0–36.0)
MCV: 102.5 fL — ABNORMAL HIGH (ref 80.0–100.0)
Monocytes Absolute: 0.1 10*3/uL (ref 0.1–1.0)
Monocytes Relative: 6 %
Neutro Abs: 1.4 10*3/uL — ABNORMAL LOW (ref 1.7–7.7)
Neutrophils Relative %: 66 %
Platelets: 5 10*3/uL — CL (ref 150–400)
RBC: 2.41 MIL/uL — ABNORMAL LOW (ref 4.22–5.81)
RDW: 24.3 % — ABNORMAL HIGH (ref 11.5–15.5)
WBC: 2.2 10*3/uL — ABNORMAL LOW (ref 4.0–10.5)
nRBC: 0 % (ref 0.0–0.2)

## 2018-12-28 LAB — PREPARE RBC (CROSSMATCH)

## 2018-12-28 MED ORDER — SODIUM CHLORIDE 0.9% IV SOLUTION
250.0000 mL | Freq: Once | INTRAVENOUS | Status: AC
  Administered 2018-12-28: 250 mL via INTRAVENOUS
  Filled 2018-12-28: qty 250

## 2018-12-28 MED ORDER — SODIUM CHLORIDE 0.9% IV SOLUTION
250.0000 mL | Freq: Once | INTRAVENOUS | Status: DC
  Filled 2018-12-28: qty 250

## 2018-12-28 MED ORDER — ACETAMINOPHEN 325 MG PO TABS
ORAL_TABLET | ORAL | Status: AC
  Filled 2018-12-28: qty 2

## 2018-12-28 MED ORDER — DIPHENHYDRAMINE HCL 25 MG PO CAPS
25.0000 mg | ORAL_CAPSULE | Freq: Once | ORAL | Status: AC
  Administered 2018-12-28: 25 mg via ORAL

## 2018-12-28 MED ORDER — ACETAMINOPHEN 325 MG PO TABS
650.0000 mg | ORAL_TABLET | Freq: Once | ORAL | Status: AC
  Administered 2018-12-28: 650 mg via ORAL

## 2018-12-28 MED ORDER — DIPHENHYDRAMINE HCL 25 MG PO CAPS
ORAL_CAPSULE | ORAL | Status: AC
  Filled 2018-12-28: qty 1

## 2018-12-28 NOTE — Progress Notes (Signed)
Pt received 1 unit PRBCs and 1 unit platelets today, tolerated both well.  VSS.  Reports feeling better at end of transfusions.  Ate and drank during without any problems.  Wife present for visit d/t pt's dementia, verbalized understanding of d/c instructions and to f/u as needed with any questions/concerns.  Urine sample taken and sent to lab d/t pt's wife reporting bloody urine over last 2 days, denies any abd/back/pelvic pain, injury, or any other symptoms of UTI.  Afebrile.  PA Lucianne Lei aware and ordered urine sample, wife aware we will contact her with results of sample.

## 2018-12-28 NOTE — Telephone Encounter (Signed)
Lab called with critical platelet count of 5. Hemoglobin is 7.4. Sandi Mealy, PA made aware. Patient is already scheduled for one unit of platelets and one unit of blood today.

## 2018-12-28 NOTE — Patient Instructions (Signed)
Blood Transfusion, Adult, Care After This sheet gives you information about how to care for yourself after your procedure. Your doctor may also give you more specific instructions. If you have problems or questions, contact your doctor. Follow these instructions at home:   Take over-the-counter and prescription medicines only as told by your doctor.  Go back to your normal activities as told by your doctor.  Follow instructions from your doctor about how to take care of the area where an IV tube was put into your vein (insertion site). Make sure you: ? Wash your hands with soap and water before you change your bandage (dressing). If there is no soap and water, use hand sanitizer. ? Change your bandage as told by your doctor.  Check your IV insertion site every day for signs of infection. Check for: ? More redness, swelling, or pain. ? More fluid or blood. ? Warmth. ? Pus or a bad smell. Contact a doctor if:  You have more redness, swelling, or pain around the IV insertion site.  You have more fluid or blood coming from the IV insertion site.  Your IV insertion site feels warm to the touch.  You have pus or a bad smell coming from the IV insertion site.  Your pee (urine) turns pink, red, or brown.  You feel weak after doing your normal activities. Get help right away if:  You have signs of a serious allergic or body defense (immune) system reaction, including: ? Itchiness. ? Hives. ? Trouble breathing. ? Anxiety. ? Pain in your chest or lower back. ? Fever, flushing, and chills. ? Fast pulse. ? Rash. ? Watery poop (diarrhea). ? Throwing up (vomiting). ? Dark pee. ? Serious headache. ? Dizziness. ? Stiff neck. ? Yellow color in your face or the white parts of your eyes (jaundice). Summary  After a blood transfusion, return to your normal activities as told by your doctor.  Every day, check for signs of infection where the IV tube was put into your vein.  Some  signs of infection are warm skin, more redness and pain, more fluid or blood, and pus or a bad smell where the needle went in.  Contact your doctor if you feel weak or have any unusual symptoms. This information is not intended to replace advice given to you by your health care provider. Make sure you discuss any questions you have with your health care provider. Document Released: 02/14/2014 Document Revised: 05/31/2017 Document Reviewed: 09/18/2015 Elsevier Patient Education  2020 [employer] Inc.  

## 2018-12-29 LAB — BPAM RBC
Blood Product Expiration Date: 202012152359
ISSUE DATE / TIME: 202011201223
Unit Type and Rh: 6200

## 2018-12-29 LAB — TYPE AND SCREEN
ABO/RH(D): A POS
Antibody Screen: NEGATIVE
Unit division: 0

## 2018-12-29 LAB — PREPARE PLATELET PHERESIS: Unit division: 0

## 2018-12-29 LAB — BPAM PLATELET PHERESIS
Blood Product Expiration Date: 202011232359
ISSUE DATE / TIME: 202011201125
Unit Type and Rh: 6200

## 2018-12-30 LAB — URINE CULTURE: Culture: >=100000 — AB

## 2018-12-31 ENCOUNTER — Other Ambulatory Visit: Payer: Self-pay | Admitting: Medical

## 2018-12-31 MED ORDER — NITROFURANTOIN MONOHYD MACRO 100 MG PO CAPS: 100.0000 mg | ORAL_CAPSULE | Freq: Two times a day (BID) | ORAL | 0 refills | Status: DC

## 2018-12-31 NOTE — Progress Notes (Signed)
These results were called to Shaun Hobbs's daughter. A message was left. She was told to call if there were questions. He was placed on Macrobid.  Sandi Mealy, MHS, PA-C

## 2019-01-01 ENCOUNTER — Other Ambulatory Visit: Payer: Medicare Other

## 2019-01-14 NOTE — Progress Notes (Signed)
  HEMATOLOGY/ONCOLOGY CLINIC NOTE  Date of Service: 01/15/2019  Patient Care Team: Badger, Michael C, MD as PCP - General (Family Medicine)  CHIEF COMPLAINTS/PURPOSE OF CONSULTATION:  Myelodysplastic Syndrome  Oncologic History:   Syed W Sluka initially presented to care with pancytopenia and was evaluated with a BM Bx on 05/16/06 which did not reveal evidence of abnormal myeloid maturation or other abnormalities, and was without chromosomal abnormality as well. A BM Bx was repeated on 08/28/07 which did reveal dyserythropoiesis and some dysplasia. A 04/17/12 Bm biopsy revealed mild dysplasia, hypercellular marrow with 1.5% blasts, and a minute CD5 positive monoclonal B-cell population detected by flow. 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 His most recent 12/27/17 Bm Bx revealed 30% cellularity with trilineage hematopoiesis, mild megakaryocytic hyperplasia with dyspoietic changes, mild erythroid hyperplasia with ringed sideroblasts, and a minute population of monoclonal B-cells detected by flow.   The pt started 5mg Revlimid for 3 months, beginning 09/03/10 "without noticeable improvement." He later began Vidaza, and completed his 21st cycle the week of November 30, 2017.  HISTORY OF PRESENTING ILLNESS:   Shaun Hobbs is a wonderful 85 y.o. male who has been referred to us by Dr. Samer Kasabari at [facility] in [place], [place] for evaluation and management of Myelodysplastic Syndrome. He is accompanied today by his wife and daughter. The pt reports that he is doing well overall.  The pt's wife reports that the first thing that occurred related to the patient's MDS was that his PLT were seen to be reduced to 99k, 12 years ago. The pt was evaluated with repeat BM biopsies over the last 12 years, and began Revlimid  for only 3 months in 2012. The pt's wife notes that he began Vidaza a little less than two years ago, after his PLT dropped to about 60k, and his last dose of Vidaza was in late October 2019. The pt has had one blood transfusion ever, which was in November 2019. The pt's wife notes that during this time of not taking Vidaza, she has not noticed a change in her husband's energy levels or day to day activities. The pt denies dizziness, light headedness, fatigue, abnormal bruising, nose bleeds, gum bleeds, or other concerns for bleeding. The pt and wife deny recent or frequent infections.  The pt's wife notes that Vidaza was stopped to "take a break," trend his counts, and to move from [place] to [place]. His last BM Bx was November 2019.  The pt and his wife note that there have not been other recent medical concerns. She notes that the pt's last A1C was 4.9.  The pt's wife notes that he is current with his annual flu vaccine, both every 5-year Prevnar and Pneumovax, and shingles vaccine.  The pt's wife notes that the pt's memory is "slowly decreasing," and he no longer drives. The pt's wife notes that he was evaluated at [facility] and the "bottome line was current memory loss," and has taken Aricept for 20 years.  Most recent lab results (02/19/18) of CBC w/diff and CMP is as follows: all values are WNL except for WBC at 1.5k, RBC at 2.42, HGB at 7.9, HCT at 23.3, MCH at 32.2, PLT at 28k, RDW at 18.8, ANC at 700, Lymphocytes at 600, Monocytes at 200, Glucose at 111, BUN at 28.  On review of systems, pt   reports stable energy levels, eating well, stable weight, good appetite, and denies fevers, fatigue, light headedness, dizziness, nose bleeds, gum bleeds, abnormal bruising, other concerns for bleeding, falls, recent infections, frequent infections, bone pains, problems passing urine, abdominal pains, lower abdominal pains, and any other symptoms.  On PMHx the pt reports MDS, HLD, Early onset Alzheimer's  dementia, Hypothyroidism, hypertrophy or prostate with urinary obstruction.   Interval History:   MICHIAL DISNEY returns today for management and evaluation of his MDS. We are joined today by Mrs. Silberman. The patient's last visit with [place] was on 10/16/2018. The pt reports that he is doing well overall.  The pt reports that he was on Macrobid for 10 days and finished his course last Wednesday. Mrs. Unrein believes that the pt had MRSA in his urine. His PCP is monitoring his UTI infection. Pt has recently had a bowel movement that was darker in color and resembled coffee grounds. He is also experiencing small bleeds on the surface of his tongue. Pt has itchy spots that have surfaced on his abdomen, back, legs and arms. These spots began showing up before the pt began taking Macrobid. Pt feels better and has more energy after his supportive transfusions. He has also gained more strength and energy after recovering from COVID-19.  Lab results today (01/15/19) of CBC w/diff and CMP is as follows: all values are WNL except for WBC at 1.4K, RBC at 2.34, Hgb at 7.1, HCT at 23.5, MCV at 100.4, RDW at 23.4, PLT at 5K, Neutro Abs at 0.7K, Lymphs Abs at 0.6K, Schistocytes are "Present", Tear Drop Cells are "Present", Polychromasia are "Present", Ovalocytes are "Present", Glucose at 124, Calcium at 8.2, Total Protein at 6.0, AST at 14.  On review of systems, pt reports petechiae on tongue/arms/abdomen/back/legs, itchy skin, dark colored/abnormal looking stools and denies any other symptoms.    MEDICAL HISTORY:  MDS, HLD, Early onset Alzheimer's dementia, Hypothyroidism, hypertrophy or prostate with urinary obstruction.   SURGICAL HISTORY: BM Bx   SOCIAL HISTORY: Social History   Socioeconomic History  . Marital status: Married    Spouse name: Not on file  . Number of children: Not on file  . Years of education: Not on file  . Highest education level: Not on file  Occupational History  . Not on file   Social Needs  . Financial resource strain: Not on file  . Food insecurity    Worry: Not on file    Inability: Not on file  . Transportation needs    Medical: Not on file    Non-medical: Not on file  Tobacco Use  . Smoking status: Never Smoker  . Smokeless tobacco: Never Used  Substance and Sexual Activity  . Alcohol use: Not Currently  . Drug use: Never  . Sexual activity: Not Currently  Lifestyle  . Physical activity    Days per week: Not on file    Minutes per session: Not on file  . Stress: Not on file  Relationships  . Social Herbalist on phone: Not on file    Gets together: Not on file    Attends religious service: Not on file    Active member of club or organization: Not on file    Attends meetings of clubs or organizations: Not on file    Relationship status: Not on file  . Intimate partner violence    Fear of current or ex partner: Not on file    Emotionally abused: Not on  file    Physically abused: Not on file    Forced sexual activity: Not on file  Other Topics Concern  . Not on file  Social History Narrative  . Not on file    FAMILY HISTORY: No family history on file.  ALLERGIES:  is allergic to sulfa antibiotics.  MEDICATIONS:  Current Outpatient Medications  Medication Sig Dispense Refill  . atorvastatin (LIPITOR) 10 MG tablet Take 5 mg by mouth daily.    . Cholecalciferol (VITAMIN D3 PO) Take 1,000 Units by mouth daily.     Marland Kitchen levothyroxine (SYNTHROID, LEVOTHROID) 100 MCG tablet Take 100 mcg by mouth daily before breakfast.    . MAGNESIUM PO Take 400 mg by mouth daily.     . sertraline (ZOLOFT) 100 MG tablet Take 100 mg by mouth at bedtime.    . tamsulosin (FLOMAX) 0.4 MG CAPS capsule Take 0.4 mg by mouth at bedtime.    Marland Kitchen acidophilus (RISAQUAD) CAPS capsule Take 1 capsule by mouth 2 (two) times daily.    Marland Kitchen albuterol (VENTOLIN HFA) 108 (90 Base) MCG/ACT inhaler Inhale 2 puffs into the lungs every 6 (six) hours as needed for wheezing or  shortness of breath. (Patient not taking: Reported on 01/15/2019)    . amoxicillin-clavulanate (AUGMENTIN) 875-125 MG tablet Take 1 tablet by mouth 2 (two) times daily.    Marland Kitchen ascorbic acid (VITAMIN C) 1000 MG tablet Take 1,000 mg by mouth daily.    Marland Kitchen donepezil (ARICEPT) 10 MG tablet Take 10 mg by mouth at bedtime.    Marland Kitchen guaiFENesin (MUCINEX) 600 MG 12 hr tablet Take 600 mg by mouth 2 (two) times daily.    . nitrofurantoin, macrocrystal-monohydrate, (MACROBID) 100 MG capsule Take 1 capsule (100 mg total) by mouth 2 (two) times daily. (Patient not taking: Reported on 01/15/2019) 20 capsule 0  . predniSONE (DELTASONE) 10 MG tablet Take 40 mg daily for 1 day, 30 mg daily for 1 day, 20 mg daily for 1 days,10 mg daily for 1 day, then stop (Patient not taking: Reported on 12/12/2018) 10 tablet 0  . zinc sulfate 220 (50 Zn) MG capsule Take 220 mg by mouth daily.     No current facility-administered medications for this visit.     REVIEW OF SYSTEMS:   A 10+ POINT REVIEW OF SYSTEMS WAS OBTAINED including neurology, dermatology, psychiatry, cardiac, respiratory, lymph, extremities, GI, GU, Musculoskeletal, constitutional, breasts, reproductive, HEENT.  All pertinent positives are noted in the HPI.  All others are negative.   PHYSICAL EXAMINATION: ECOG PERFORMANCE STATUS: 2-3   . Vitals:   01/15/19 1008  BP: (!) 115/57  Pulse: 77  Resp: 18  Temp: 98 F (36.7 C)  SpO2: 99%   Filed Weights   01/15/19 1008  Weight: 157 lb 9.6 oz (71.5 kg)   .Body mass index is 22.29 kg/m.  Exam was given in a wheelchair   GENERAL:alert, in no acute distress and comfortable SKIN: no acute rashes, no significant lesions EYES: conjunctiva are pink and non-injected, sclera anicteric OROPHARYNX: MMM, no exudates, no oropharyngeal erythema or ulceration NECK: supple, no JVD LYMPH:  no palpable lymphadenopathy in the cervical, axillary or inguinal regions LUNGS: clear to auscultation b/l with normal respiratory  effort HEART: regular rate & rhythm ABDOMEN:  normoactive bowel sounds , non tender, not distended. No palpable hepatosplenomegaly.  Extremity: no pedal edema PSYCH: alert & oriented x 3 with fluent speech NEURO: no focal motor/sensory deficits  LABORATORY DATA:  I have reviewed the data as listed  .  CBC Latest Ref Rng & Units 01/15/2019 12/28/2018 12/18/2018  WBC 4.0 - 10.5 K/uL 1.4(L) 2.2(L) 3.6(L)  Hemoglobin 13.0 - 17.0 g/dL 7.1(L) 7.4(L) 7.8(L)  Hematocrit 39.0 - 52.0 % 23.5(L) 24.7(L) 25.8(L)  Platelets 150 - 400 K/uL 5(LL) 5(LL) 27(L)   ANC 500 . CMP Latest Ref Rng & Units 01/15/2019 12/18/2018 12/12/2018  Glucose 70 - 99 mg/dL 124(H) 104(H) 109(H)  BUN 8 - 23 mg/dL 15 22 28(H)  Creatinine 0.61 - 1.24 mg/dL 0.80 0.85 0.89  Sodium 135 - 145 mmol/L 138 139 138  Potassium 3.5 - 5.1 mmol/L 4.2 4.1 3.6  Chloride 98 - 111 mmol/L 105 103 108  CO2 22 - 32 mmol/L 24 26 22  Calcium 8.9 - 10.3 mg/dL 8.2(L) 8.7(L) 8.3(L)  Total Protein 6.5 - 8.1 g/dL 6.0(L) 6.2(L) -  Total Bilirubin 0.3 - 1.2 mg/dL 0.4 0.7 -  Alkaline Phos 38 - 126 U/L 56 55 -  AST 15 - 41 U/L 14(L) 13(L) -  ALT 0 - 44 U/L 8 6 -    CBC w/diff and CMP:    11/19/15 BM Bx:   11/19/15 Cytogenetics:   12/27/17 Bm Bx:    RADIOGRAPHIC STUDIES: I have personally reviewed the radiological images as listed and agreed with the findings in the report. No results found.  ASSESSMENT & PLAN:  85 y.o. male with:  1. 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 No evidence for a lymphoproliferative disorder. Normal cytogenetics. 08/28/07 BM Bx revealed mild dyserythropoiesis and a suggestion of some dysplasia within the myeloid series. 04/17/12 BM Bx revealed hypercellular marrow with 50% cellularity, 1.5%  blasts, with trilineage hematopoiesis, marrow monocytosis, mild myeloid and megakaryocytic dysplasia, adequate storage and iron utilization, no ring sideroblasts. Small monoclonal B lymphocytic population of uncertain significance. 11/19/15 BM Bx revealed Hypercellular marrow for age about 70% with panmyelosis including increased megakaryocytes with atypia, moderate marrow monocytosis about 15-20% and <5% blasts. Minute CD5 positive monoclonal B-cell population. Storage iron present. 12/27/17 BM Bx revealed normocellular marrow for age with 30% cellularity and trilineage hematopoiesis, mild megakaryocytic hyperplasia with dyspoietic changes, mild erythroid hyperplasia with ringed sideroblasts, and minute population of monoclonal B cells detected by flow.  S/p 21 cycles of Vidaza completed in the week of November 30, 2017. Previous oncologic notes suggest that the patient's pancytopenia was continuing to worsen through Vidaza treatment.  Labs upon initial presentation from 02/19/18, WBC at 1.5k, HGB at 7.9, PLT at 28k, ANC at 700   PLAN: -Discussed pt labwork today, 01/15/19; leukopenia, anemia, severe thrombocytopenia, blood chemistries are steady -Advised pt that his dark, abnormal looking stools could be concerning for an upper GI bleed -Advised again that blood/platelet transfusions are not curative, but palliative  -Advised pt that low RBC, WBC, and PLTs typically result in a serious infection or risk of major bleeding event -Discussed symptoms of a major bleeding event including: seizures, headaches, syncope, altered mental status, bloody/black stools -Will consider preventive antibiotics and antifungals for repeat infections -Will continue transfusion schedule for 1 Unit of PRBC and 1 Unit of platelets every 2 weeks  -Will give pt 1 unit of PRBC and 2 unit of PLTs today -Continue to f/u with PCP for UTI treatment -Will see back in 2 months with labs -Advised pt to contact if symptoms change  significantly   in the interim   FOLLOW UP: Plz schedule 1 unit of PRBC and 2 units of Platelets q2weeks x 4 RTC with Dr  in 8 weeks   The total time spent in the appt was 20 minutes and more than 50% was on counseling and direct patient cares.  All of the patient's questions were answered with apparent satisfaction. The patient knows to call the clinic with any problems, questions or concerns.      MD MS AAHIVMS SCH CTH Hematology/Oncology Physician [facility]  (Office):       [phone] (Work cell):  [phone] (Fax):           [phone]  01/15/2019 10:45 AM  I, Jazzmine Knight, am acting as a scribe for Dr.  .   .I have reviewed the above documentation for accuracy and completeness, and I agree with the above. . Kishore  MD       

## 2019-01-15 ENCOUNTER — Other Ambulatory Visit: Payer: Self-pay | Admitting: *Deleted

## 2019-01-15 ENCOUNTER — Inpatient Hospital Stay (HOSPITAL_BASED_OUTPATIENT_CLINIC_OR_DEPARTMENT_OTHER): Payer: Medicare Other | Admitting: Hematology

## 2019-01-15 ENCOUNTER — Inpatient Hospital Stay: Payer: Medicare Other

## 2019-01-15 ENCOUNTER — Other Ambulatory Visit: Payer: Self-pay

## 2019-01-15 ENCOUNTER — Inpatient Hospital Stay: Payer: Medicare Other | Attending: Hematology

## 2019-01-15 VITALS — BP 115/57 | HR 77 | Temp 98.0°F | Resp 18 | Ht 70.5 in | Wt 157.6 lb

## 2019-01-15 VITALS — BP 108/55 | HR 67 | Temp 98.4°F | Resp 16

## 2019-01-15 DIAGNOSIS — D469 Myelodysplastic syndrome, unspecified: Secondary | ICD-10-CM | POA: Diagnosis present

## 2019-01-15 DIAGNOSIS — G300 Alzheimer's disease with early onset: Secondary | ICD-10-CM | POA: Diagnosis not present

## 2019-01-15 DIAGNOSIS — D649 Anemia, unspecified: Secondary | ICD-10-CM

## 2019-01-15 DIAGNOSIS — D61818 Other pancytopenia: Secondary | ICD-10-CM | POA: Diagnosis not present

## 2019-01-15 DIAGNOSIS — F0280 Dementia in other diseases classified elsewhere without behavioral disturbance: Secondary | ICD-10-CM | POA: Diagnosis not present

## 2019-01-15 DIAGNOSIS — E785 Hyperlipidemia, unspecified: Secondary | ICD-10-CM | POA: Insufficient documentation

## 2019-01-15 DIAGNOSIS — D696 Thrombocytopenia, unspecified: Secondary | ICD-10-CM

## 2019-01-15 DIAGNOSIS — N390 Urinary tract infection, site not specified: Secondary | ICD-10-CM | POA: Insufficient documentation

## 2019-01-15 DIAGNOSIS — E039 Hypothyroidism, unspecified: Secondary | ICD-10-CM | POA: Diagnosis not present

## 2019-01-15 DIAGNOSIS — N401 Enlarged prostate with lower urinary tract symptoms: Secondary | ICD-10-CM | POA: Diagnosis not present

## 2019-01-15 DIAGNOSIS — N138 Other obstructive and reflux uropathy: Secondary | ICD-10-CM | POA: Diagnosis not present

## 2019-01-15 LAB — CMP (CANCER CENTER ONLY)
ALT: 8 U/L (ref 0–44)
AST: 14 U/L — ABNORMAL LOW (ref 15–41)
Albumin: 3.5 g/dL (ref 3.5–5.0)
Alkaline Phosphatase: 56 U/L (ref 38–126)
Anion gap: 9 (ref 5–15)
BUN: 15 mg/dL (ref 8–23)
CO2: 24 mmol/L (ref 22–32)
Calcium: 8.2 mg/dL — ABNORMAL LOW (ref 8.9–10.3)
Chloride: 105 mmol/L (ref 98–111)
Creatinine: 0.8 mg/dL (ref 0.61–1.24)
GFR, Est AFR Am: >60 mL/min (ref >60)
GFR, Estimated: >60 mL/min (ref >60)
Glucose, Bld: 124 mg/dL — ABNORMAL HIGH (ref 70–99)
Potassium: 4.2 mmol/L (ref 3.5–5.1)
Sodium: 138 mmol/L (ref 135–145)
Total Bilirubin: 0.4 mg/dL (ref 0.3–1.2)
Total Protein: 6 g/dL — ABNORMAL LOW (ref 6.5–8.1)

## 2019-01-15 LAB — CBC WITH DIFFERENTIAL/PLATELET
Abs Immature Granulocytes: 0 10*3/uL (ref 0.00–0.07)
Basophils Absolute: 0 10*3/uL (ref 0.0–0.1)
Basophils Relative: 0 %
Eosinophils Absolute: 0 10*3/uL (ref 0.0–0.5)
Eosinophils Relative: 1 %
HCT: 23.5 % — ABNORMAL LOW (ref 39.0–52.0)
Hemoglobin: 7.1 g/dL — ABNORMAL LOW (ref 13.0–17.0)
Immature Granulocytes: 0 %
Lymphocytes Relative: 40 %
Lymphs Abs: 0.6 10*3/uL — ABNORMAL LOW (ref 0.7–4.0)
MCH: 30.3 pg (ref 26.0–34.0)
MCHC: 30.2 g/dL (ref 30.0–36.0)
MCV: 100.4 fL — ABNORMAL HIGH (ref 80.0–100.0)
Monocytes Absolute: 0.1 10*3/uL (ref 0.1–1.0)
Monocytes Relative: 7 %
Neutro Abs: 0.7 10*3/uL — ABNORMAL LOW (ref 1.7–7.7)
Neutrophils Relative %: 52 %
Platelets: 5 10*3/uL — CL (ref 150–400)
RBC: 2.34 MIL/uL — ABNORMAL LOW (ref 4.22–5.81)
RDW: 23.4 % — ABNORMAL HIGH (ref 11.5–15.5)
WBC: 1.4 10*3/uL — ABNORMAL LOW (ref 4.0–10.5)
nRBC: 0 % (ref 0.0–0.2)

## 2019-01-15 LAB — SAMPLE TO BLOOD BANK

## 2019-01-15 LAB — PREPARE RBC (CROSSMATCH)

## 2019-01-15 MED ORDER — ACETAMINOPHEN 325 MG PO TABS
650.0000 mg | ORAL_TABLET | Freq: Once | ORAL | Status: AC
  Administered 2019-01-15: 11:00:00 650 mg via ORAL

## 2019-01-15 MED ORDER — ACETAMINOPHEN 325 MG PO TABS
ORAL_TABLET | ORAL | Status: AC
  Filled 2019-01-15: qty 2

## 2019-01-15 MED ORDER — SODIUM CHLORIDE 0.9% IV SOLUTION
250.0000 mL | Freq: Once | INTRAVENOUS | Status: AC
  Administered 2019-01-15: 250 mL via INTRAVENOUS
  Filled 2019-01-15: qty 250

## 2019-01-15 MED ORDER — METHYLPREDNISOLONE SODIUM SUCC 125 MG IJ SOLR
INTRAMUSCULAR | Status: AC
  Filled 2019-01-15: qty 2

## 2019-01-15 MED ORDER — METHYLPREDNISOLONE SODIUM SUCC 125 MG IJ SOLR
60.0000 mg | Freq: Once | INTRAMUSCULAR | Status: AC
  Administered 2019-01-15: 11:00:00 60 mg via INTRAVENOUS

## 2019-01-15 NOTE — Patient Instructions (Signed)
Blood Transfusion, Adult, Care After This sheet gives you information about how to care for yourself after your procedure. Your doctor may also give you more specific instructions. If you have problems or questions, contact your doctor. Follow these instructions at home:   Take over-the-counter and prescription medicines only as told by your doctor.  Go back to your normal activities as told by your doctor.  Follow instructions from your doctor about how to take care of the area where an IV tube was put into your vein (insertion site). Make sure you: ? Wash your hands with soap and water before you change your bandage (dressing). If there is no soap and water, use hand sanitizer. ? Change your bandage as told by your doctor.  Check your IV insertion site every day for signs of infection. Check for: ? More redness, swelling, or pain. ? More fluid or blood. ? Warmth. ? Pus or a bad smell. Contact a doctor if:  You have more redness, swelling, or pain around the IV insertion site.  You have more fluid or blood coming from the IV insertion site.  Your IV insertion site feels warm to the touch.  You have pus or a bad smell coming from the IV insertion site.  Your pee (urine) turns pink, red, or brown.  You feel weak after doing your normal activities. Get help right away if:  You have signs of a serious allergic or body defense (immune) system reaction, including: ? Itchiness. ? Hives. ? Trouble breathing. ? Anxiety. ? Pain in your chest or lower back. ? Fever, flushing, and chills. ? Fast pulse. ? Rash. ? Watery poop (diarrhea). ? Throwing up (vomiting). ? Dark pee. ? Serious headache. ? Dizziness. ? Stiff neck. ? Yellow color in your face or the white parts of your eyes (jaundice). Summary  After a blood transfusion, return to your normal activities as told by your doctor.  Every day, check for signs of infection where the IV tube was put into your vein.  Some  signs of infection are warm skin, more redness and pain, more fluid or blood, and pus or a bad smell where the needle went in.  Contact your doctor if you feel weak or have any unusual symptoms. This information is not intended to replace advice given to you by your health care provider. Make sure you discuss any questions you have with your health care provider. Document Released: 02/14/2014 Document Revised: 05/31/2017 Document Reviewed: 09/18/2015 Elsevier Patient Education  2020 [employer] Inc.  

## 2019-01-16 ENCOUNTER — Telehealth: Payer: Self-pay | Admitting: Hematology

## 2019-01-16 ENCOUNTER — Telehealth: Payer: Self-pay | Admitting: *Deleted

## 2019-01-16 LAB — PREPARE PLATELET PHERESIS
Unit division: 0
Unit division: 0

## 2019-01-16 LAB — TYPE AND SCREEN
ABO/RH(D): A POS
Antibody Screen: NEGATIVE
Unit division: 0

## 2019-01-16 LAB — BPAM RBC
Blood Product Expiration Date: 202012202359
ISSUE DATE / TIME: 202012081151
Unit Type and Rh: 6200

## 2019-01-16 LAB — BPAM PLATELET PHERESIS
Blood Product Expiration Date: 202012092359
Blood Product Expiration Date: 202012092359
ISSUE DATE / TIME: 202012081400
ISSUE DATE / TIME: 202012081506
Unit Type and Rh: 6200
Unit Type and Rh: 6200

## 2019-01-16 NOTE — Telephone Encounter (Signed)
Scheduled per los. Called and left msg. mailed printout  

## 2019-01-16 NOTE — Telephone Encounter (Signed)
Patient wife called, left VM: following shower yesterday, she noticed patient had large inflamed raised 'bump' - about 2 inches. Bump is draining. She asked if she should contact PCP?  Attempted to contact patient wife - LVM advising her to contact PCP for evaluation and/or treatment guidance.

## 2019-01-29 ENCOUNTER — Telehealth: Payer: Self-pay | Admitting: Hematology

## 2019-01-29 MED ORDER — SODIUM CHLORIDE 0.9% FLUSH
3.0000 mL | INTRAVENOUS | Status: DC | PRN
  Filled 2019-01-29: qty 10

## 2019-01-29 NOTE — Telephone Encounter (Signed)
Scheduled appt per 12/21 sch message - pt aware of appt date and time . Spoke with [name]

## 2019-01-30 ENCOUNTER — Inpatient Hospital Stay: Payer: Medicare Other

## 2019-01-30 ENCOUNTER — Other Ambulatory Visit: Payer: Self-pay | Admitting: *Deleted

## 2019-01-30 ENCOUNTER — Other Ambulatory Visit: Payer: Self-pay

## 2019-01-30 DIAGNOSIS — D469 Myelodysplastic syndrome, unspecified: Secondary | ICD-10-CM

## 2019-01-30 DIAGNOSIS — D649 Anemia, unspecified: Secondary | ICD-10-CM

## 2019-01-30 DIAGNOSIS — D696 Thrombocytopenia, unspecified: Secondary | ICD-10-CM

## 2019-01-30 LAB — CBC WITH DIFFERENTIAL/PLATELET
Abs Immature Granulocytes: 0.01 10*3/uL (ref 0.00–0.07)
Basophils Absolute: 0 10*3/uL (ref 0.0–0.1)
Basophils Relative: 0 %
Eosinophils Absolute: 0 10*3/uL (ref 0.0–0.5)
Eosinophils Relative: 0 %
HCT: 25.2 % — ABNORMAL LOW (ref 39.0–52.0)
Hemoglobin: 7.8 g/dL — ABNORMAL LOW (ref 13.0–17.0)
Immature Granulocytes: 1 %
Lymphocytes Relative: 44 %
Lymphs Abs: 0.7 10*3/uL (ref 0.7–4.0)
MCH: 30.1 pg (ref 26.0–34.0)
MCHC: 31 g/dL (ref 30.0–36.0)
MCV: 97.3 fL (ref 80.0–100.0)
Monocytes Absolute: 0.2 10*3/uL (ref 0.1–1.0)
Monocytes Relative: 10 %
Neutro Abs: 0.7 10*3/uL — ABNORMAL LOW (ref 1.7–7.7)
Neutrophils Relative %: 45 %
Platelets: 5 10*3/uL — CL (ref 150–400)
RBC: 2.59 MIL/uL — ABNORMAL LOW (ref 4.22–5.81)
RDW: 22 % — ABNORMAL HIGH (ref 11.5–15.5)
WBC: 1.6 10*3/uL — ABNORMAL LOW (ref 4.0–10.5)
nRBC: 0 % (ref 0.0–0.2)

## 2019-01-30 LAB — SAMPLE TO BLOOD BANK

## 2019-01-30 LAB — PREPARE RBC (CROSSMATCH)

## 2019-01-30 MED ORDER — SODIUM CHLORIDE 0.9% IV SOLUTION
250.0000 mL | Freq: Once | INTRAVENOUS | Status: AC
  Administered 2019-01-30: 250 mL via INTRAVENOUS
  Filled 2019-01-30: qty 250

## 2019-01-30 MED ORDER — ACETAMINOPHEN 325 MG PO TABS
650.0000 mg | ORAL_TABLET | Freq: Once | ORAL | Status: AC
  Administered 2019-01-30: 10:00:00 650 mg via ORAL

## 2019-01-30 MED ORDER — METHYLPREDNISOLONE SODIUM SUCC 125 MG IJ SOLR
INTRAMUSCULAR | Status: AC
  Filled 2019-01-30: qty 2

## 2019-01-30 MED ORDER — METHYLPREDNISOLONE SODIUM SUCC 125 MG IJ SOLR
60.0000 mg | Freq: Once | INTRAMUSCULAR | Status: AC
  Administered 2019-01-30: 60 mg via INTRAVENOUS

## 2019-01-30 MED ORDER — HEPARIN SOD (PORK) LOCK FLUSH 100 UNIT/ML IV SOLN
250.0000 [IU] | INTRAVENOUS | Status: DC | PRN
  Filled 2019-01-30: qty 5

## 2019-01-30 MED ORDER — ACETAMINOPHEN 325 MG PO TABS
ORAL_TABLET | ORAL | Status: AC
  Filled 2019-01-30: qty 2

## 2019-01-30 NOTE — Patient Instructions (Signed)
Blood Transfusion, Adult, Care After This sheet gives you information about how to care for yourself after your procedure. Your doctor may also give you more specific instructions. If you have problems or questions, contact your doctor. Follow these instructions at home:   Take over-the-counter and prescription medicines only as told by your doctor.  Go back to your normal activities as told by your doctor.  Follow instructions from your doctor about how to take care of the area where an IV tube was put into your vein (insertion site). Make sure you: ? Wash your hands with soap and water before you change your bandage (dressing). If there is no soap and water, use hand sanitizer. ? Change your bandage as told by your doctor.  Check your IV insertion site every day for signs of infection. Check for: ? More redness, swelling, or pain. ? More fluid or blood. ? Warmth. ? Pus or a bad smell. Contact a doctor if:  You have more redness, swelling, or pain around the IV insertion site.  You have more fluid or blood coming from the IV insertion site.  Your IV insertion site feels warm to the touch.  You have pus or a bad smell coming from the IV insertion site.  Your pee (urine) turns pink, red, or brown.  You feel weak after doing your normal activities. Get help right away if:  You have signs of a serious allergic or body defense (immune) system reaction, including: ? Itchiness. ? Hives. ? Trouble breathing. ? Anxiety. ? Pain in your chest or lower back. ? Fever, flushing, and chills. ? Fast pulse. ? Rash. ? Watery poop (diarrhea). ? Throwing up (vomiting). ? Dark pee. ? Serious headache. ? Dizziness. ? Stiff neck. ? Yellow color in your face or the white parts of your eyes (jaundice). Summary  After a blood transfusion, return to your normal activities as told by your doctor.  Every day, check for signs of infection where the IV tube was put into your vein.  Some  signs of infection are warm skin, more redness and pain, more fluid or blood, and pus or a bad smell where the needle went in.  Contact your doctor if you feel weak or have any unusual symptoms. This information is not intended to replace advice given to you by your health care provider. Make sure you discuss any questions you have with your health care provider. Document Released: 02/14/2014 Document Revised: 05/31/2017 Document Reviewed: 09/18/2015 Elsevier Patient Education  [place].   Platelet Transfusion A platelet transfusion is a procedure in which you receive donated platelets through an IV. Platelets are tiny pieces of blood cells. When you get an injury, platelets clump together in the area to form a blood clot. This helps stop bleeding and is the beginning of the healing process. If you have too few platelets, your blood may have trouble clotting. This may cause you to bleed and bruise very easily. You may need a platelet transfusion if you have a condition that causes a low number of platelets (thrombocytopenia). A platelet transfusion may be used to stop or prevent excessive bleeding. Tell a health care provider about:  Any reactions you have had during previous transfusions.  Any allergies you have.  All medicines you are taking, including vitamins, herbs, eye drops, creams, and over-the-counter medicines.  Any blood disorders you have.  Any surgeries you have had.  Any medical conditions you have.  Whether you are pregnant or may be  pregnant. What are the risks? Generally, this is a safe procedure. However, problems may occur, including:  Fever.  Infection.  Allergic reaction to the donor platelets.  Your body's disease-fighting system (immune system) attacking the donor platelets (hemolytic reaction). This is rare.  A rare reaction that causes lung damage (transfusion-related acute lung injury). What happens before the procedure? Medicines  Ask your  health care provider about: ? Changing or stopping your regular medicines. This is especially important if you are taking diabetes medicines or blood thinners. ? Taking medicines such as aspirin and ibuprofen. These medicines can thin your blood. Do not take these medicines unless your health care provider tells you to take them. ? Taking over-the-counter medicines, vitamins, herbs, and supplements. General instructions  You will have a blood test to determine your blood type. Your blood type determines what kind of platelets you will be given.  Follow instructions from your health care provider about eating or drinking restrictions.  If you have had an allergic reaction to a transfusion in the past, you may be given medicine to help prevent a reaction.  Your temperature, blood pressure, pulse, and breathing will be monitored. What happens during the procedure?   An IV will be inserted into one of your veins.  For your safety, two health care providers will verify your identity along with the donor platelets about to be infused.  A bag of donor platelets will be connected to your IV. The platelets will flow into your bloodstream. This usually takes 30-60 minutes.  Your temperature, blood pressure, pulse, and breathing will be monitored during the transfusion. This helps detect early signs of any reaction.  You will also be monitored for other symptoms that may indicate a reaction, including chills, hives, or itching.  If you have signs of a reaction at any time, your transfusion will be stopped, and you may be given medicine to help manage the reaction.  When your transfusion is complete, your IV will be removed.  Pressure may be applied to the IV site for a few minutes to stop any bleeding.  The IV site will be covered with a bandage (dressing). The procedure may vary among health care providers and hospitals. What happens after the procedure?  Your blood pressure, temperature,  pulse, and breathing will be monitored until you leave the hospital or clinic.  You may have some bruising and soreness at your IV site. Follow these instructions at home: Medicines  Take over-the-counter and prescription medicines only as told by your health care provider.  Talk with your health care provider before you take any medicines that contain aspirin or NSAIDs. These medicines increase your risk for dangerous bleeding. General instructions  Change or remove your dressing as told by your health care provider.  Return to your normal activities as told by your health care provider. Ask your health care provider what activities are safe for you.  Do not take baths, swim, or use a hot tub until your health care provider approves. Ask your health care provider if you may take showers.  Check your IV site every day for signs of infection. Check for: ? Redness, swelling, or pain. ? Fluid or blood. If fluid or blood drains from your IV site, use your hands to press down firmly on a bandage covering the area for a minute or two. Doing this should stop the bleeding. ? Warmth. ? Pus or a bad smell.  Keep all follow-up visits as told by your health care  provider. This is important. Contact a health care provider if you have:  A headache that does not go away with medicine.  Hives, rash, or itchy skin.  Nausea or vomiting.  Unusual tiredness or weakness.  Signs of infection at your IV site. Get help right away if:  You have a fever or chills.  You urinate less often than usual.  Your urine is darker colored than normal.  You have any of the following: ? Trouble breathing. ? Pain in your back, abdomen, or chest. ? Cool, clammy skin. ? A fast heartbeat. Summary  Platelets are tiny pieces of blood cells that clump together to form a blood clot when you have an injury. If you have too few platelets, your blood may have trouble clotting.  A platelet transfusion is a  procedure in which you receive donated platelets through an IV.  A platelet transfusion may be used to stop or prevent excessive bleeding.  After the procedure, check your IV site every day for signs of infection, including redness, swelling, pain, or warmth. This information is not intended to replace advice given to you by your health care provider. Make sure you discuss any questions you have with your health care provider. Document Released: 11/21/2006 Document Revised: 03/01/2017 Document Reviewed: 03/01/2017 Elsevier Patient Education  2020 [place] (COVID-19) Are you at risk?  Are you at risk for the Coronavirus (COVID-19)?  To be considered HIGH RISK for Coronavirus (COVID-19), you have to meet the following criteria:  . Traveled to [place], [place], [place], [place] or [place]; or in the [place] to [place], [place], [place], or [place]; and have fever, cough, and shortness of breath within the last 2 weeks of travel OR . Been in close contact with a person diagnosed with COVID-19 within the last 2 weeks and have fever, cough, and shortness of breath . IF YOU DO NOT MEET THESE CRITERIA, YOU ARE CONSIDERED LOW RISK FOR COVID-19.  What to do if you are HIGH RISK for COVID-19?  Marland Kitchen If you are having a medical emergency, call 911. . Seek medical care right away. Before you go to a doctor's office, urgent care or emergency department, call ahead and tell them about your recent travel, contact with someone diagnosed with COVID-19, and your symptoms. You should receive instructions from your physician's office regarding next steps of care.  . When you arrive at healthcare provider, tell the healthcare staff immediately you have returned from visiting [place], [place], [place], [place] or [place]; or traveled in the [place] to [place], [place], [place], or [place]; in the last two weeks or you have been in close contact with a person diagnosed with  COVID-19 in the last 2 weeks.   . Tell the health care staff about your symptoms: fever, cough and shortness of breath. . After you have been seen by a medical provider, you will be either: o Tested for (COVID-19) and discharged home on quarantine except to seek medical care if symptoms worsen, and asked to  - Stay home and avoid contact with others until you get your results (4-5 days)  - Avoid travel on public transportation if possible (such as bus, train, or airplane) or o Sent to the Emergency Department by EMS for evaluation, COVID-19 testing, and possible admission depending on your condition and test results.  What to do if you are LOW RISK for COVID-19?  Reduce your risk of any infection by using the  same precautions used for avoiding the common cold or flu:  Marland Kitchen Wash your hands often with soap and warm water for at least 20 seconds.  If soap and water are not readily available, use an alcohol-based hand sanitizer with at least 60% alcohol.  . If coughing or sneezing, cover your mouth and nose by coughing or sneezing into the elbow areas of your shirt or coat, into a tissue or into your sleeve (not your hands). . Avoid shaking hands with others and consider head nods or verbal greetings only. . Avoid touching your eyes, nose, or mouth with unwashed hands.  . Avoid close contact with people who are sick. . Avoid places or events with large numbers of people in one location, like concerts or sporting events. . Carefully consider travel plans you have or are making. . If you are planning any travel outside or inside the [place], visit the CDC's Travelers' Health webpage for the latest health notices. . If you have some symptoms but not all symptoms, continue to monitor at home and seek medical attention if your symptoms worsen. . If you are having a medical emergency, call 911.   [place] / e-Visit:  [email or web address]         [facility]: [facility]: [id number]                   [facility]: [phone]

## 2019-01-31 LAB — BPAM RBC
Blood Product Expiration Date: 202101152359
ISSUE DATE / TIME: 202012231014
Unit Type and Rh: 6200

## 2019-01-31 LAB — PREPARE PLATELET PHERESIS
Unit division: 0
Unit division: 0

## 2019-01-31 LAB — BPAM PLATELET PHERESIS
Blood Product Expiration Date: 202012232359
Blood Product Expiration Date: 202012232359
ISSUE DATE / TIME: 202012231212
ISSUE DATE / TIME: 202012231329
Unit Type and Rh: 7300
Unit Type and Rh: 8400

## 2019-01-31 LAB — TYPE AND SCREEN
ABO/RH(D): A POS
Antibody Screen: NEGATIVE
Unit division: 0

## 2019-02-12 ENCOUNTER — Other Ambulatory Visit: Payer: Self-pay

## 2019-02-12 ENCOUNTER — Inpatient Hospital Stay: Payer: Medicare Other | Attending: Hematology

## 2019-02-12 ENCOUNTER — Inpatient Hospital Stay: Payer: Medicare Other

## 2019-02-12 ENCOUNTER — Other Ambulatory Visit: Payer: Self-pay | Admitting: *Deleted

## 2019-02-12 DIAGNOSIS — D469 Myelodysplastic syndrome, unspecified: Secondary | ICD-10-CM | POA: Diagnosis present

## 2019-02-12 DIAGNOSIS — D649 Anemia, unspecified: Secondary | ICD-10-CM

## 2019-02-12 DIAGNOSIS — D696 Thrombocytopenia, unspecified: Secondary | ICD-10-CM

## 2019-02-12 LAB — CBC WITH DIFFERENTIAL/PLATELET
Abs Immature Granulocytes: 0.01 10*3/uL (ref 0.00–0.07)
Basophils Absolute: 0 10*3/uL (ref 0.0–0.1)
Basophils Relative: 0 %
Eosinophils Absolute: 0 10*3/uL (ref 0.0–0.5)
Eosinophils Relative: 1 %
HCT: 26.2 % — ABNORMAL LOW (ref 39.0–52.0)
Hemoglobin: 8.2 g/dL — ABNORMAL LOW (ref 13.0–17.0)
Immature Granulocytes: 1 %
Lymphocytes Relative: 49 %
Lymphs Abs: 0.7 10*3/uL (ref 0.7–4.0)
MCH: 30.6 pg (ref 26.0–34.0)
MCHC: 31.3 g/dL (ref 30.0–36.0)
MCV: 97.8 fL (ref 80.0–100.0)
Monocytes Absolute: 0.2 10*3/uL (ref 0.1–1.0)
Monocytes Relative: 12 %
Neutro Abs: 0.5 10*3/uL — ABNORMAL LOW (ref 1.7–7.7)
Neutrophils Relative %: 37 %
Platelets: <5 10*3/uL — CL (ref 150–400)
RBC: 2.68 MIL/uL — ABNORMAL LOW (ref 4.22–5.81)
RDW: 20.9 % — ABNORMAL HIGH (ref 11.5–15.5)
WBC: 1.4 10*3/uL — ABNORMAL LOW (ref 4.0–10.5)
nRBC: 0 % (ref 0.0–0.2)

## 2019-02-12 LAB — SAMPLE TO BLOOD BANK

## 2019-02-12 MED ORDER — SODIUM CHLORIDE 0.9% IV SOLUTION
250.0000 mL | Freq: Once | INTRAVENOUS | Status: AC
  Administered 2019-02-12: 250 mL via INTRAVENOUS
  Filled 2019-02-12: qty 250

## 2019-02-12 MED ORDER — METHYLPREDNISOLONE SODIUM SUCC 125 MG IJ SOLR
INTRAMUSCULAR | Status: AC
  Filled 2019-02-12: qty 2

## 2019-02-12 MED ORDER — METHYLPREDNISOLONE SODIUM SUCC 125 MG IJ SOLR
60.0000 mg | Freq: Once | INTRAMUSCULAR | Status: AC
  Administered 2019-02-12: 60 mg via INTRAVENOUS

## 2019-02-12 MED ORDER — ACETAMINOPHEN 325 MG PO TABS
ORAL_TABLET | ORAL | Status: AC
  Filled 2019-02-12: qty 2

## 2019-02-12 MED ORDER — ACETAMINOPHEN 325 MG PO TABS
650.0000 mg | ORAL_TABLET | Freq: Once | ORAL | Status: AC
  Administered 2019-02-12: 650 mg via ORAL

## 2019-02-12 NOTE — Patient Instructions (Signed)

## 2019-02-13 LAB — BPAM PLATELET PHERESIS
Blood Product Expiration Date: 202101060854
Blood Product Expiration Date: 202101072359
ISSUE DATE / TIME: 202101050959
ISSUE DATE / TIME: 202101051131
Unit Type and Rh: 6200
Unit Type and Rh: 6200

## 2019-02-13 LAB — PREPARE PLATELET PHERESIS
Unit division: 0
Unit division: 0

## 2019-02-25 ENCOUNTER — Other Ambulatory Visit: Payer: Self-pay | Admitting: Emergency Medicine

## 2019-02-25 DIAGNOSIS — D649 Anemia, unspecified: Secondary | ICD-10-CM

## 2019-02-25 DIAGNOSIS — D469 Myelodysplastic syndrome, unspecified: Secondary | ICD-10-CM

## 2019-02-25 DIAGNOSIS — D696 Thrombocytopenia, unspecified: Secondary | ICD-10-CM

## 2019-02-26 ENCOUNTER — Other Ambulatory Visit: Payer: Self-pay

## 2019-02-26 ENCOUNTER — Inpatient Hospital Stay: Payer: Medicare Other

## 2019-02-26 ENCOUNTER — Telehealth: Payer: Self-pay

## 2019-02-26 DIAGNOSIS — E7800 Pure hypercholesterolemia, unspecified: Secondary | ICD-10-CM | POA: Insufficient documentation

## 2019-02-26 DIAGNOSIS — Z9889 Other specified postprocedural states: Secondary | ICD-10-CM | POA: Insufficient documentation

## 2019-02-26 DIAGNOSIS — D696 Thrombocytopenia, unspecified: Secondary | ICD-10-CM

## 2019-02-26 DIAGNOSIS — D469 Myelodysplastic syndrome, unspecified: Secondary | ICD-10-CM | POA: Diagnosis not present

## 2019-02-26 DIAGNOSIS — M545 Low back pain, unspecified: Secondary | ICD-10-CM | POA: Insufficient documentation

## 2019-02-26 DIAGNOSIS — D61818 Other pancytopenia: Secondary | ICD-10-CM

## 2019-02-26 DIAGNOSIS — E0500 Thyrotoxicosis with diffuse goiter without thyrotoxic crisis or storm: Secondary | ICD-10-CM | POA: Insufficient documentation

## 2019-02-26 DIAGNOSIS — I10 Essential (primary) hypertension: Secondary | ICD-10-CM | POA: Insufficient documentation

## 2019-02-26 DIAGNOSIS — D649 Anemia, unspecified: Secondary | ICD-10-CM

## 2019-02-26 LAB — CMP (CANCER CENTER ONLY)
ALT: <6 U/L (ref 0–44)
AST: 12 U/L — ABNORMAL LOW (ref 15–41)
Albumin: 4 g/dL (ref 3.5–5.0)
Alkaline Phosphatase: 52 U/L (ref 38–126)
Anion gap: 7 (ref 5–15)
BUN: 20 mg/dL (ref 8–23)
CO2: 24 mmol/L (ref 22–32)
Calcium: 8.2 mg/dL — ABNORMAL LOW (ref 8.9–10.3)
Chloride: 108 mmol/L (ref 98–111)
Creatinine: 0.92 mg/dL (ref 0.61–1.24)
GFR, Est AFR Am: >60 mL/min (ref >60)
GFR, Estimated: >60 mL/min (ref >60)
Glucose, Bld: 130 mg/dL — ABNORMAL HIGH (ref 70–99)
Potassium: 4.1 mmol/L (ref 3.5–5.1)
Sodium: 139 mmol/L (ref 135–145)
Total Bilirubin: 0.5 mg/dL (ref 0.3–1.2)
Total Protein: 6.2 g/dL — ABNORMAL LOW (ref 6.5–8.1)

## 2019-02-26 LAB — CBC WITH DIFFERENTIAL/PLATELET
Abs Immature Granulocytes: 0 10*3/uL (ref 0.00–0.07)
Basophils Absolute: 0 10*3/uL (ref 0.0–0.1)
Basophils Relative: 0 %
Eosinophils Absolute: 0 10*3/uL (ref 0.0–0.5)
Eosinophils Relative: 0 %
HCT: 23.9 % — ABNORMAL LOW (ref 39.0–52.0)
Hemoglobin: 7.4 g/dL — ABNORMAL LOW (ref 13.0–17.0)
Immature Granulocytes: 0 %
Lymphocytes Relative: 38 %
Lymphs Abs: 0.5 10*3/uL — ABNORMAL LOW (ref 0.7–4.0)
MCH: 30.5 pg (ref 26.0–34.0)
MCHC: 31 g/dL (ref 30.0–36.0)
MCV: 98.4 fL (ref 80.0–100.0)
Monocytes Absolute: 0.1 10*3/uL (ref 0.1–1.0)
Monocytes Relative: 9 %
Neutro Abs: 0.6 10*3/uL — ABNORMAL LOW (ref 1.7–7.7)
Neutrophils Relative %: 53 %
Platelets: <5 10*3/uL — CL (ref 150–400)
RBC: 2.43 MIL/uL — ABNORMAL LOW (ref 4.22–5.81)
RDW: 21.8 % — ABNORMAL HIGH (ref 11.5–15.5)
WBC: 1.2 10*3/uL — ABNORMAL LOW (ref 4.0–10.5)
nRBC: 0 % (ref 0.0–0.2)

## 2019-02-26 LAB — SAMPLE TO BLOOD BANK

## 2019-02-26 LAB — PREPARE RBC (CROSSMATCH)

## 2019-02-26 MED ORDER — ACETAMINOPHEN 325 MG PO TABS
ORAL_TABLET | ORAL | Status: AC
  Filled 2019-02-26: qty 2

## 2019-02-26 MED ORDER — METHYLPREDNISOLONE SODIUM SUCC 125 MG IJ SOLR
INTRAMUSCULAR | Status: AC
  Filled 2019-02-26: qty 2

## 2019-02-26 MED ORDER — METHYLPREDNISOLONE SODIUM SUCC 125 MG IJ SOLR
60.0000 mg | Freq: Once | INTRAMUSCULAR | Status: AC
  Administered 2019-02-26: 60 mg via INTRAVENOUS

## 2019-02-26 MED ORDER — ACETAMINOPHEN 325 MG PO TABS
650.0000 mg | ORAL_TABLET | Freq: Once | ORAL | Status: AC
  Administered 2019-02-26: 650 mg via ORAL

## 2019-02-26 MED ORDER — SODIUM CHLORIDE 0.9% IV SOLUTION
250.0000 mL | Freq: Once | INTRAVENOUS | Status: AC
  Administered 2019-02-26: 250 mL via INTRAVENOUS
  Filled 2019-02-26: qty 250

## 2019-02-26 NOTE — Patient Instructions (Signed)
Platelet Transfusion A platelet transfusion is a procedure in which you receive donated platelets through an IV. Platelets are tiny pieces of blood cells. When you get an injury, platelets clump together in the area to form a blood clot. This helps stop bleeding and is the beginning of the healing process. If you have too few platelets, your blood may have trouble clotting. This may cause you to bleed and bruise very easily. You may need a platelet transfusion if you have a condition that causes a low number of platelets (thrombocytopenia). A platelet transfusion may be used to stop or prevent excessive bleeding. Tell a health care provider about:  Any reactions you have had during previous transfusions.  Any allergies you have.  All medicines you are taking, including vitamins, herbs, eye drops, creams, and over-the-counter medicines.  Any blood disorders you have.  Any surgeries you have had.  Any medical conditions you have.  Whether you are pregnant or may be pregnant. What are the risks? Generally, this is a safe procedure. However, problems may occur, including:  Fever.  Infection.  Allergic reaction to the donor platelets.  Your body's disease-fighting system (immune system) attacking the donor platelets (hemolytic reaction). This is rare.  A rare reaction that causes lung damage (transfusion-related acute lung injury). What happens before the procedure? Medicines  Ask your health care provider about: ? Changing or stopping your regular medicines. This is especially important if you are taking diabetes medicines or blood thinners. ? Taking medicines such as aspirin and ibuprofen. These medicines can thin your blood. Do not take these medicines unless your health care provider tells you to take them. ? Taking over-the-counter medicines, vitamins, herbs, and supplements. General instructions  You will have a blood test to determine your blood type. Your blood type  determines what kind of platelets you will be given.  Follow instructions from your health care provider about eating or drinking restrictions.  If you have had an allergic reaction to a transfusion in the past, you may be given medicine to help prevent a reaction.  Your temperature, blood pressure, pulse, and breathing will be monitored. What happens during the procedure?   An IV will be inserted into one of your veins.  For your safety, two health care providers will verify your identity along with the donor platelets about to be infused.  A bag of donor platelets will be connected to your IV. The platelets will flow into your bloodstream. This usually takes 30-60 minutes.  Your temperature, blood pressure, pulse, and breathing will be monitored during the transfusion. This helps detect early signs of any reaction.  You will also be monitored for other symptoms that may indicate a reaction, including chills, hives, or itching.  If you have signs of a reaction at any time, your transfusion will be stopped, and you may be given medicine to help manage the reaction.  When your transfusion is complete, your IV will be removed.  Pressure may be applied to the IV site for a few minutes to stop any bleeding.  The IV site will be covered with a bandage (dressing). The procedure may vary among health care providers and hospitals. What happens after the procedure?  Your blood pressure, temperature, pulse, and breathing will be monitored until you leave the hospital or clinic.  You may have some bruising and soreness at your IV site. Follow these instructions at home: Medicines  Take over-the-counter and prescription medicines only as told by your health care provider.    Talk with your health care provider before you take any medicines that contain aspirin or NSAIDs. These medicines increase your risk for dangerous bleeding. General instructions  Change or remove your dressing as told  by your health care provider.  Return to your normal activities as told by your health care provider. Ask your health care provider what activities are safe for you.  Do not take baths, swim, or use a hot tub until your health care provider approves. Ask your health care provider if you may take showers.  Check your IV site every day for signs of infection. Check for: ? Redness, swelling, or pain. ? Fluid or blood. If fluid or blood drains from your IV site, use your hands to press down firmly on a bandage covering the area for a minute or two. Doing this should stop the bleeding. ? Warmth. ? Pus or a bad smell.  Keep all follow-up visits as told by your health care provider. This is important. Contact a health care provider if you have:  A headache that does not go away with medicine.  Hives, rash, or itchy skin.  Nausea or vomiting.  Unusual tiredness or weakness.  Signs of infection at your IV site. Get help right away if:  You have a fever or chills.  You urinate less often than usual.  Your urine is darker colored than normal.  You have any of the following: ? Trouble breathing. ? Pain in your back, abdomen, or chest. ? Cool, clammy skin. ? A fast heartbeat. Summary  Platelets are tiny pieces of blood cells that clump together to form a blood clot when you have an injury. If you have too few platelets, your blood may have trouble clotting.  A platelet transfusion is a procedure in which you receive donated platelets through an IV.  A platelet transfusion may be used to stop or prevent excessive bleeding.  After the procedure, check your IV site every day for signs of infection, including redness, swelling, pain, or warmth. This information is not intended to replace advice given to you by your health care provider. Make sure you discuss any questions you have with your health care provider. Document Revised: 03/01/2017 Document Reviewed: 03/01/2017 Elsevier  Patient Education  2020 [employer]   Blood Transfusion, Adult, Care After This sheet gives you information about how to care for yourself after your procedure. Your doctor may also give you more specific instructions. If you have problems or questions, contact your doctor. What can I expect after the procedure? After the procedure, it is common to have:  Bruising and soreness at the IV site.  A fever or chills on the day of the procedure. This may be your body's response to the new blood cells received.  A headache. Follow these instructions at home: Insertion site care      Follow instructions from your doctor about how to take care of your insertion site. This is where an IV tube was put into your vein. Make sure you: ? Wash your hands with soap and water before and after you change your bandage (dressing). If you cannot use soap and water, use hand sanitizer. ? Change your bandage as told by your doctor.  Check your insertion site every day for signs of infection. Check for: ? Redness, swelling, or pain. ? Bleeding from the site. ? Warmth. ? Pus or a bad smell. General instructions  Take over-the-counter and prescription medicines only as told by your doctor.  Rest   as told by your doctor.  Go back to your normal activities as told by your doctor.  Keep all follow-up visits as told by your doctor. This is important. Contact a doctor if:  You have itching or red, swollen areas of skin (hives).  You feel worried or nervous (anxious).  You feel weak after doing your normal activities.  You have redness, swelling, warmth, or pain around the insertion site.  You have blood coming from the insertion site, and the blood does not stop with pressure.  You have pus or a bad smell coming from the insertion site. Get help right away if:  You have signs of a serious reaction. This may be coming from an allergy or the body's defense system (immune system). Signs  include: ? Trouble breathing or shortness of breath. ? Swelling of the face or feeling warm (flushed). ? Fever or chills. ? Head, chest, or back pain. ? Dark pee (urine) or blood in the pee. ? Widespread rash. ? Fast heartbeat. ? Feeling dizzy or light-headed. You may receive your blood transfusion in an outpatient setting. If so, you will be told whom to contact to report any reactions. These symptoms may be an emergency. Do not wait to see if the symptoms will go away. Get medical help right away. Call your local emergency services (911 in the [place] Do not drive yourself to the hospital. Summary  Bruising and soreness at the IV site are common.  Check your insertion site every day for signs of infection.  Rest as told by your doctor. Go back to your normal activities as told by your doctor.  Get help right away if you have signs of a serious reaction. This information is not intended to replace advice given to you by your health care provider. Make sure you discuss any questions you have with your health care provider. Document Revised: 07/19/2018 Document Reviewed: 07/19/2018 Elsevier Patient Education  2020 [employer]  

## 2019-02-26 NOTE — Telephone Encounter (Signed)
Dr. Irene Limbo made aware platelet count is 5 and hemoglobin is 7.4. Per Dr. Irene Limbo, patient to receive one unit of blood and two units of platelets. Infusion RN made aware. Spoke to [place] in blood bank to confirm orders received. Beth will call infusion when products are ready.

## 2019-02-26 NOTE — Progress Notes (Signed)
LATE ENTRY-Lab called at [phone] to report platelets at less than 5 for Shaun Hobbs.  Notified Desk RN at [phone] but she had seen it and had ordered platelets per Dr. Irene Limbo. Shaun Hobbs is in infusion now. Gardiner Rhyme, RN

## 2019-02-27 LAB — PREPARE PLATELET PHERESIS
Unit division: 0
Unit division: 0

## 2019-02-27 LAB — BPAM PLATELET PHERESIS
Blood Product Expiration Date: 202101212359
Blood Product Expiration Date: 202101212359
ISSUE DATE / TIME: 202101191000
ISSUE DATE / TIME: 202101191247
Unit Type and Rh: 6200
Unit Type and Rh: 6200

## 2019-02-27 LAB — TYPE AND SCREEN
ABO/RH(D): A POS
Antibody Screen: NEGATIVE
Unit division: 0

## 2019-02-27 LAB — BPAM RBC
Blood Product Expiration Date: 202101312359
ISSUE DATE / TIME: 202101191000
Unit Type and Rh: 6200

## 2019-03-11 NOTE — Progress Notes (Signed)
HEMATOLOGY/ONCOLOGY CLINIC NOTE  Date of Service: 03/12/2019  Patient Care Team: Chesley Noon, MD as PCP - General (Family Medicine)  CHIEF COMPLAINTS/PURPOSE OF CONSULTATION:  Myelodysplastic Syndrome  Oncologic History:   Shaun Hobbs initially presented to care with pancytopenia and was evaluated with a BM Bx on 05/16/06 which did not reveal evidence of abnormal myeloid maturation or other abnormalities, and was without chromosomal abnormality as well. A BM Bx was repeated on 08/28/07 which did reveal dyserythropoiesis and some dysplasia. A 04/17/12 Bm biopsy revealed mild dysplasia, hypercellular marrow with 1.5% blasts, and a minute CD5 positive monoclonal B-cell population detected by flow. 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 His most recent 12/27/17 Bm Bx revealed 30% cellularity with trilineage hematopoiesis, mild megakaryocytic hyperplasia with dyspoietic changes, mild erythroid hyperplasia with ringed sideroblasts, and a minute population of monoclonal B-cells detected by flow.   The pt started 1m Revlimid for 3 months, beginning 09/03/10 "without noticeable improvement." He later began Vidaza, and completed his 21st cycle the week of November 30, 2017.  HISTORY OF PRESENTING ILLNESS:   Shaun KOROLis a wonderful 86y.o. male who [place] [facility] [place] [facility] [place] Myelodys[place] Syndrome. He is accompanied today by his wife and daughter. The pt reports that he is doing well overall.  The pt's wife reports that the first thing that occurred related to the patient's MDS was that his PLT were seen to be reduced to 99k, 12 years ago. The pt was evaluated with repeat BM biopsies over the last 12 years, and began Revlimid  for only 3 months in 2012. The pt's wife notes that he began Vidaza a little less than two years ago, after his PLT dropped to about 60k, and his last dose of Vidaza was in late October 2019. The pt has had one blood transfusion ever, which was in November 2019. The pt's wife notes that during this time of not taking Vidaza, she has not noticed a change in her husband's energy levels or day to day activities. The pt denies dizziness, light headedness, fatigue, abnormal bruising, nose bleeds, gum bleeds, or other concerns for bleeding. The pt and wife deny recent or frequent infections.  The pt's wife notes that Vidaza was stopped to "take a break," trend his counts, and [place] His last BM Bx was November 2019.  The pt and his wife note that there have not been other recent medical concerns. She notes that the pt's last A1C was 4.9.  The pt's wife notes that he is current with his annual flu vaccine, both every 5-year Prevnar and Pneumovax, and shingles vaccine.  The pt's wife notes that the pt's memory is "slowly decreasing," and he no longer drives. The pt's [facility]," and has taken Aricept for 20 years.  Most recent lab results (02/19/18) of CBC w/diff and CMP is as follows: all values are WNL except for WBC at 1.5k, RBC at 2.42, HGB at 7.9, HCT at 23.3, MCH at 32.2, PLT at 28k, RDW at 18.8, ANC at 700, Lymphocytes at 600, Monocytes at 200, Glucose at 111, BUN at 28.  On review of systems, pt  reports stable energy levels, eating well, stable weight, good appetite, and denies fevers, fatigue, light headedness, dizziness, nose bleeds, gum bleeds, abnormal bruising, other concerns for bleeding, falls, recent infections, frequent infections, bone pains, problems passing urine, abdominal pains, lower abdominal pains, and any other symptoms.  On PMHx the pt reports MDS, HLD, Early onset Alzheimer's  dementia, Hypothyroidism, hypertrophy or prostate with urinary obstruction.   Interval History:   [name] returns today for management and evaluation of his MDS. We are joined today by Mrs. Arvidson. The patient's last visit with [place] was on 01/15/2019. The pt reports that he is doing well overall.  The pt reports that he is still experiencing petechiae on his face, arms, and legs. There is a petechiae that has become hardened on his left forearm. Mrs. Mccorkle is unsure if pt has been picking or messing at the area. There was a substance oozing from the area prior to our visit. Mrs. Seawood notes that pt has been feeling well and walked 1/2 mile last week. Pt has been eating well. He has no known history of Atrial Fibrillation or other heart conditions. Mrs. Hoskin noticed some blood in pt's underwear 2 months ago, but has not observed this since. He has not been using OTC pain medications regularly.   Lab results today (03/12/19) of CBC w/diff and CMP is as follows: all values are WNL except for WBC at 1.2K, RBC at 2.75, Hgb at 8.2, HCT at 26.8, RDW at 22.3, PLT at <5K, Neutro Abs at 0.5K, Lymphs Abs at 0.6K.  On review of systems, pt reports bruising, petechiae, gum bleeds and denies nose bleeds, abdominal pain and any other symptoms.    MEDICAL HISTORY:  MDS, HLD, Early onset Alzheimer's dementia, Hypothyroidism, hypertrophy or prostate with urinary obstruction.   SURGICAL HISTORY: BM Bx   SOCIAL HISTORY: Social History   Socioeconomic History  . Marital status: Married    Spouse name: Not on file  . Number of children: Not on file  . Years of education: Not on file  . Highest education level: Not on file  Occupational History  . Not on file  Tobacco Use  . Smoking status: Never Smoker  . Smokeless tobacco: Never Used  Substance and Sexual Activity  . Alcohol use: Not Currently  . Drug use: Never  . Sexual activity: Not Currently  Other Topics Concern  . Not on file  Social  History Narrative  . Not on file   Social Determinants of Health   Financial Resource Strain:   . Difficulty of Paying Living Expenses: Not on file  Food Insecurity:   . Worried About Charity fundraiser in the Last Year: Not on file  . Ran Out of Food in the Last Year: Not on file  Transportation Needs:   . Lack of Transportation (Medical): Not on file  . Lack of Transportation (Non-Medical): Not on file  Physical Activity:   . Days of Exercise per Week: Not on file  . Minutes of Exercise per Session: Not on file  Stress:   . Feeling of Stress : Not on file  Social Connections:   . Frequency of Communication with Friends and Family: Not on file  . Frequency of Social Gatherings with Friends and Family: Not on file  . Attends Religious Services: Not on file  . Active Member of Clubs or Organizations: Not on file  . Attends Archivist Meetings: Not on file  . Marital Status: Not on  file  Intimate Partner Violence:   . Fear of Current or Ex-Partner: Not on file  . Emotionally Abused: Not on file  . Physically Abused: Not on file  . Sexually Abused: Not on file    FAMILY HISTORY: No family history on file.  ALLERGIES:  is allergic to sulfa antibiotics.  MEDICATIONS:  Current Outpatient Medications  Medication Sig Dispense Refill  . atorvastatin (LIPITOR) 10 MG tablet Take 5 mg by mouth daily.    . Cholecalciferol (VITAMIN D3 PO) Take 1,000 Units by mouth daily.     Marland Kitchen donepezil (ARICEPT) 10 MG tablet Take 10 mg by mouth at bedtime.    Marland Kitchen levothyroxine (SYNTHROID, LEVOTHROID) 100 MCG tablet Take 100 mcg by mouth daily before breakfast.    . MAGNESIUM PO Take 400 mg by mouth daily.     . sertraline (ZOLOFT) 100 MG tablet Take 100 mg by mouth at bedtime.    . tamsulosin (FLOMAX) 0.4 MG CAPS capsule Take 0.4 mg by mouth at bedtime.    . Ascorbic Acid 120 MG CHEW Chew by mouth.    . mupirocin ointment (BACTROBAN) 2 % Apply 1 application topically 2 (two) times daily.  To area of induration on forearm 22 g 2  . tranexamic acid (LYSTEDA) 650 MG TABS tablet Take 1 tablet (650 mg total) by mouth 2 (two) times daily. 60 tablet 2   No current facility-administered medications for this visit.    REVIEW OF SYSTEMS:   A 10+ POINT REVIEW OF SYSTEMS WAS OBTAINED including neurology, dermatology, psychiatry, cardiac, respiratory, lymph, extremities, GI, GU, Musculoskeletal, constitutional, breasts, reproductive, HEENT.  All pertinent positives are noted in the HPI.  All others are negative.   PHYSICAL EXAMINATION: ECOG PERFORMANCE STATUS: 2-3   . Vitals:   03/12/19 0848  BP: 118/60  Pulse: 77  Resp: 18  Temp: 98.3 F (36.8 C)  SpO2: 100%   Filed Weights   03/12/19 0848  Weight: 167 lb (75.8 kg)   .Body mass index is 23.62 kg/m.  GENERAL:alert, in no acute distress and comfortable SKIN: no acute rashes, no significant lesions EYES: conjunctiva are pink and non-injected, sclera anicteric OROPHARYNX: MMM, no exudates, no oropharyngeal erythema or ulceration NECK: supple, no JVD LYMPH:  no palpable lymphadenopathy in the cervical, axillary or inguinal regions LUNGS: clear to auscultation b/l with normal respiratory effort HEART: regular rate & rhythm ABDOMEN:  normoactive bowel sounds , non tender, not distended. No palpable hepatosplenomegaly.  Extremity: no pedal edema PSYCH: alert & oriented x 3 with fluent speech NEURO: no focal motor/sensory deficits  LABORATORY DATA:  I have reviewed the data as listed  . CBC Latest Ref Rng & Units 03/12/2019 02/26/2019 02/12/2019  WBC 4.0 - 10.5 K/uL 1.2(L) 1.2(L) 1.4(L)  Hemoglobin 13.0 - 17.0 g/dL 8.2(L) 7.4(L) 8.2(L)  Hematocrit 39.0 - 52.0 % 26.8(L) 23.9(L) 26.2(L)  Platelets 150 - 400 K/uL <5(LL) <5(LL) <5(LL)   ANC 500 . CMP Latest Ref Rng & Units 02/26/2019 01/15/2019 12/18/2018  Glucose 70 - 99 mg/dL 130(H) 124(H) 104(H)  BUN 8 - 23 mg/dL '20 15 22  ' Creatinine 0.61 - 1.24 mg/dL 0.92 0.80 0.85    Sodium 135 - 145 mmol/L 139 138 139  Potassium 3.5 - 5.1 mmol/L 4.1 4.2 4.1  Chloride 98 - 111 mmol/L 108 105 103  CO2 22 - 32 mmol/L '24 24 26  ' Calcium 8.9 - 10.3 mg/dL 8.2(L) 8.2(L) 8.7(L)  Total Protein 6.5 - 8.1 g/dL 6.2(L) 6.0(L) 6.2(L)  Total Bilirubin  0.3 - 1.2 mg/dL 0.5 0.4 0.7  Alkaline Phos 38 - 126 U/L 52 56 55  AST 15 - 41 U/L 12(L) 14(L) 13(L)  ALT 0 - 44 U/L <'6 8 6    ' CBC w/diff and CMP:    11/19/15 BM Bx:   11/19/15 Cytogenetics:   12/27/17 Bm Bx:    RADIOGRAPHIC STUDIES: I have personally reviewed the radiological images as listed and agreed with the findings in the report. No results found.  ASSESSMENT & PLAN:  86 y.o. male with:  1. 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 No evidence for a lymphoproliferative disorder. Normal cytogenetics. 08/28/07 BM Bx revealed mild dyserythropoiesis and a suggestion of some dysplasia within the myeloid series. 04/17/12 BM Bx revealed hypercellular marrow with 50% cellularity, 1.5% blasts, with trilineage hematopoiesis, marrow monocytosis, mild myeloid and megakaryocytic dysplasia, adequate storage and iron utilization, no ring sideroblasts. Small monoclonal B lymphocytic population of uncertain significance. 11/19/15 BM Bx revealed Hypercellular marrow for age about 70% with panmyelosis including increased megakaryocytes with atypia, moderate marrow monocytosis about 15-20% and <5% blasts. Minute CD5 positive monoclonal B-cell population. Storage iron present. 12/27/17 BM Bx revealed normocellular marrow for age with 30% cellularity and trilineage hematopoiesis, mild megakaryocytic hyperplasia with dyspoietic changes, mild erythroid hyperplasia with ringed sideroblasts, and minute population of monoclonal B  cells detected by flow.  S/p 21 cycles of Vidaza completed in the week of November 30, 2017. Previous oncologic notes suggest that the patient's pancytopenia was continuing to worsen through [place] treatment.  Labs upon initial presentation from 02/19/18, WBC at 1.5k, HGB at 7.9, PLT at 28k, ANC at 700   PLAN: -Discussed pt labwork today, 03/12/19; all values are WNL except for WBC at 1.2K, RBC at 2.75, Hgb at 8.2, HCT at 26.8, RDW at 22.3, PLT at <5K, Neutro Abs at 0.5K, Lymphs Abs at 0.6K. -Hgb is holding steady, PLT are still undetectable -Advised again that blood/platelet transfusions are not curative, but palliative  -Will consider preventive antibiotics and antifungals for repeat infections - pt currently has a low threshold for antibiotics  -Advised pt that he is at high risk for traumatic or spontaneous bleeding  -Recommend pt use a soft-bristle tooth brush  -Recommend pt shave the same day as PLT transfusion if he wants to shave -Recommend pt bandage/wrap forearm lesion to prevent infection -Discussed using Amicar or Lysteda to reduce the risk of bleeding  -Advised pt of the increased risk of clotting with Amicar or Lysteda - risk of bleeding is greater than risk of clotting at this time -Will continue transfusion schedule for 1 unit of PRBC and 2 units of platelets every 2 weeks  -Advised pt that Zoloft can affect platelet function, recommend lowering dose or discontinuing medication -Recommend pt f/u with Dr. Melford Aase to discuss Zoloft  -Rx Lysteda, Mupirocin  -Will see back in 2 months with labs -Advised pt to watch out for infections, worsening fatigue, major bleeds    FOLLOW UP: Plz schedule 1 unit of PRBC and 2 units of Platelets q2weeks x 4 RTC with Dr Irene Limbo in 8 weeks   The total time spent in the appt was 30 minutes and more than 50% was on counseling and direct patient cares.  All of the patient's questions were answered with apparent satisfaction. The patient knows to  call the clinic with any problems, questions or concerns.    Sullivan Lone MD [place] AAHIVMS [facility] Hematology/Oncology Physician [facility]  (Office):       [phone] (Work cell):  [phone] (Fax):           [phone]  03/12/2019 10:03 AM  I, Yevette Edwards, am acting as a scribe for Dr. Sullivan Lone.   .I have reviewed the above documentation for accuracy and completeness, and I agree with the above. Brunetta Genera MD

## 2019-03-12 ENCOUNTER — Telehealth: Payer: Self-pay | Admitting: *Deleted

## 2019-03-12 ENCOUNTER — Other Ambulatory Visit: Payer: Self-pay | Admitting: *Deleted

## 2019-03-12 ENCOUNTER — Inpatient Hospital Stay: Payer: Medicare Other

## 2019-03-12 ENCOUNTER — Inpatient Hospital Stay: Payer: Medicare Other | Attending: Hematology | Admitting: Hematology

## 2019-03-12 ENCOUNTER — Other Ambulatory Visit: Payer: Self-pay

## 2019-03-12 VITALS — BP 118/60 | HR 77 | Temp 98.3°F | Resp 18 | Ht 70.5 in | Wt 167.0 lb

## 2019-03-12 DIAGNOSIS — D469 Myelodysplastic syndrome, unspecified: Secondary | ICD-10-CM | POA: Diagnosis present

## 2019-03-12 DIAGNOSIS — D696 Thrombocytopenia, unspecified: Secondary | ICD-10-CM

## 2019-03-12 DIAGNOSIS — D649 Anemia, unspecified: Secondary | ICD-10-CM

## 2019-03-12 LAB — CBC WITH DIFFERENTIAL/PLATELET
Abs Immature Granulocytes: 0 10*3/uL (ref 0.00–0.07)
Basophils Absolute: 0 10*3/uL (ref 0.0–0.1)
Basophils Relative: 0 %
Eosinophils Absolute: 0 10*3/uL (ref 0.0–0.5)
Eosinophils Relative: 2 %
HCT: 26.8 % — ABNORMAL LOW (ref 39.0–52.0)
Hemoglobin: 8.2 g/dL — ABNORMAL LOW (ref 13.0–17.0)
Immature Granulocytes: 0 %
Lymphocytes Relative: 44 %
Lymphs Abs: 0.6 10*3/uL — ABNORMAL LOW (ref 0.7–4.0)
MCH: 29.8 pg (ref 26.0–34.0)
MCHC: 30.6 g/dL (ref 30.0–36.0)
MCV: 97.5 fL (ref 80.0–100.0)
Monocytes Absolute: 0.2 10*3/uL (ref 0.1–1.0)
Monocytes Relative: 14 %
Neutro Abs: 0.5 10*3/uL — ABNORMAL LOW (ref 1.7–7.7)
Neutrophils Relative %: 40 %
Platelets: <5 10*3/uL — CL (ref 150–400)
RBC: 2.75 MIL/uL — ABNORMAL LOW (ref 4.22–5.81)
RDW: 22.3 % — ABNORMAL HIGH (ref 11.5–15.5)
WBC: 1.2 10*3/uL — ABNORMAL LOW (ref 4.0–10.5)
nRBC: 0 % (ref 0.0–0.2)

## 2019-03-12 LAB — SAMPLE TO BLOOD BANK

## 2019-03-12 LAB — PREPARE RBC (CROSSMATCH)

## 2019-03-12 MED ORDER — METHYLPREDNISOLONE SODIUM SUCC 40 MG IJ SOLR
40.0000 mg | Freq: Once | INTRAMUSCULAR | Status: AC
  Administered 2019-03-12: 40 mg via INTRAVENOUS

## 2019-03-12 MED ORDER — METHYLPREDNISOLONE SODIUM SUCC 40 MG IJ SOLR
INTRAMUSCULAR | Status: AC
  Filled 2019-03-12: qty 1

## 2019-03-12 MED ORDER — HEPARIN SOD (PORK) LOCK FLUSH 100 UNIT/ML IV SOLN
500.0000 [IU] | Freq: Every day | INTRAVENOUS | Status: DC | PRN
  Filled 2019-03-12: qty 5

## 2019-03-12 MED ORDER — SODIUM CHLORIDE 0.9% IV SOLUTION
250.0000 mL | Freq: Once | INTRAVENOUS | Status: AC
  Administered 2019-03-12: 250 mL via INTRAVENOUS
  Filled 2019-03-12: qty 250

## 2019-03-12 MED ORDER — ACETAMINOPHEN 325 MG PO TABS
ORAL_TABLET | ORAL | Status: AC
  Filled 2019-03-12: qty 2

## 2019-03-12 MED ORDER — SODIUM CHLORIDE 0.9% FLUSH
10.0000 mL | INTRAVENOUS | Status: DC | PRN
  Filled 2019-03-12: qty 10

## 2019-03-12 MED ORDER — TRANEXAMIC ACID 650 MG PO TABS: 650.0000 mg | ORAL_TABLET | Freq: Two times a day (BID) | ORAL | 2 refills | Status: DC

## 2019-03-12 MED ORDER — MUPIROCIN 2 % EX OINT: 1.0000 "application " | TOPICAL_OINTMENT | Freq: Two times a day (BID) | CUTANEOUS | 2 refills | Status: DC

## 2019-03-12 MED ORDER — ACETAMINOPHEN 325 MG PO TABS
650.0000 mg | ORAL_TABLET | Freq: Once | ORAL | Status: AC
  Administered 2019-03-12: 650 mg via ORAL

## 2019-03-12 NOTE — Patient Instructions (Signed)
Platelet Transfusion A platelet transfusion is a procedure in which you receive donated platelets through an IV. Platelets are tiny pieces of blood cells. When you get an injury, platelets clump together in the area to form a blood clot. This helps stop bleeding and is the beginning of the healing process. If you have too few platelets, your blood may have trouble clotting. This may cause you to bleed and bruise very easily. You may need a platelet transfusion if you have a condition that causes a low number of platelets (thrombocytopenia). A platelet transfusion may be used to stop or prevent excessive bleeding. Tell a health care provider about:  Any reactions you have had during previous transfusions.  Any allergies you have.  All medicines you are taking, including vitamins, herbs, eye drops, creams, and over-the-counter medicines.  Any blood disorders you have.  Any surgeries you have had.  Any medical conditions you have.  Whether you are pregnant or may be pregnant. What are the risks? Generally, this is a safe procedure. However, problems may occur, including:  Fever.  Infection.  Allergic reaction to the donor platelets.  Your body's disease-fighting system (immune system) attacking the donor platelets (hemolytic reaction). This is rare.  A rare reaction that causes lung damage (transfusion-related acute lung injury). What happens before the procedure? Medicines  Ask your health care provider about: ? Changing or stopping your regular medicines. This is especially important if you are taking diabetes medicines or blood thinners. ? Taking medicines such as aspirin and ibuprofen. These medicines can thin your blood. Do not take these medicines unless your health care provider tells you to take them. ? Taking over-the-counter medicines, vitamins, herbs, and supplements. General instructions  You will have a blood test to determine your blood type. Your blood type  determines what kind of platelets you will be given.  Follow instructions from your health care provider about eating or drinking restrictions.  If you have had an allergic reaction to a transfusion in the past, you may be given medicine to help prevent a reaction.  Your temperature, blood pressure, pulse, and breathing will be monitored. What happens during the procedure?   An IV will be inserted into one of your veins.  For your safety, two health care providers will verify your identity along with the donor platelets about to be infused.  A bag of donor platelets will be connected to your IV. The platelets will flow into your bloodstream. This usually takes 30-60 minutes.  Your temperature, blood pressure, pulse, and breathing will be monitored during the transfusion. This helps detect early signs of any reaction.  You will also be monitored for other symptoms that may indicate a reaction, including chills, hives, or itching.  If you have signs of a reaction at any time, your transfusion will be stopped, and you may be given medicine to help manage the reaction.  When your transfusion is complete, your IV will be removed.  Pressure may be applied to the IV site for a few minutes to stop any bleeding.  The IV site will be covered with a bandage (dressing). The procedure may vary among health care providers and hospitals. What happens after the procedure?  Your blood pressure, temperature, pulse, and breathing will be monitored until you leave the hospital or clinic.  You may have some bruising and soreness at your IV site. Follow these instructions at home: Medicines  Take over-the-counter and prescription medicines only as told by your health care provider.  Talk with your health care provider before you take any medicines that contain aspirin or NSAIDs. These medicines increase your risk for dangerous bleeding. General instructions  Change or remove your dressing as told  by your health care provider.  Return to your normal activities as told by your health care provider. Ask your health care provider what activities are safe for you.  Do not take baths, swim, or use a hot tub until your health care provider approves. Ask your health care provider if you may take showers.  Check your IV site every day for signs of infection. Check for: ? Redness, swelling, or pain. ? Fluid or blood. If fluid or blood drains from your IV site, use your hands to press down firmly on a bandage covering the area for a minute or two. Doing this should stop the bleeding. ? Warmth. ? Pus or a bad smell.  Keep all follow-up visits as told by your health care provider. This is important. Contact a health care provider if you have:  A headache that does not go away with medicine.  Hives, rash, or itchy skin.  Nausea or vomiting.  Unusual tiredness or weakness.  Signs of infection at your IV site. Get help right away if:  You have a fever or chills.  You urinate less often than usual.  Your urine is darker colored than normal.  You have any of the following: ? Trouble breathing. ? Pain in your back, abdomen, or chest. ? Cool, clammy skin. ? A fast heartbeat. Summary  Platelets are tiny pieces of blood cells that clump together to form a blood clot when you have an injury. If you have too few platelets, your blood may have trouble clotting.  A platelet transfusion is a procedure in which you receive donated platelets through an IV.  A platelet transfusion may be used to stop or prevent excessive bleeding.  After the procedure, check your IV site every day for signs of infection, including redness, swelling, pain, or warmth. This information is not intended to replace advice given to you by your health care provider. Make sure you discuss any questions you have with your health care provider. Document Revised: 03/01/2017 Document Reviewed: 03/01/2017 Elsevier  Patient Education  2020 [place].   Blood Transfusion, Adult A blood transfusion is a procedure in which you receive blood through an IV tube. You may need this procedure because of:  A bleeding disorder.  An illness.  An injury.  A surgery. The blood may come from someone else (a donor). You may also be able to donate blood for yourself. The blood given in a transfusion is made up of different types of cells. You may get:  Red blood cells. These carry oxygen to the cells in the body.  White blood cells. These help you fight infections.  Platelets. These help your blood to clot.  Plasma. This is the liquid part of your blood. It carries proteins and other substances through the body. If you have a clotting disorder, you may also get other types of blood products. Tell your doctor about:  Any blood disorders you have.  Any reactions you have had during a blood transfusion in the past.  Any allergies you have.  All medicines you are taking, including vitamins, herbs, eye drops, creams, and over-the-counter medicines.  Any surgeries you have had.  Any medical conditions you have. This includes any recent fever or cold symptoms.  Whether you are pregnant or may be  pregnant. What are the risks? Generally, this is a safe procedure. However, problems may occur.  The most common problems include: ? A mild allergic reaction. This includes red, swollen areas of skin (hives) and itching. ? Fever or chills. This may be the body's response to new blood cells received. This may happen during or up to 4 hours after the transfusion.  More serious problems may include: ? Too much fluid in the lungs. This may cause breathing problems. ? A serious allergic reaction. This includes breathing trouble or swelling around the face and lips. ? Lung injury. This causes breathing trouble and low oxygen in the blood. This can happen within hours of the transfusion or days later. ? Too much  iron. This can happen after getting many blood transfusions over a period of time. ? An infection or virus passed through the blood. This is rare. Donated blood is carefully tested before it is given. ? Your body's defense system (immune system) trying to attack the new blood cells. This is rare. Symptoms may include fever, chills, nausea, low blood pressure, and low back or chest pain. ? Donated cells attacking healthy tissues. This is rare. What happens before the procedure? Medicines Ask your doctor about:  Changing or stopping your normal medicines. This is important.  Taking aspirin and ibuprofen. Do not take these medicines unless your doctor tells you to take them.  Taking over-the-counter medicines, vitamins, herbs, and supplements. General instructions  Follow instructions from your doctor about what you cannot eat or drink.  You will have a blood test to find out your blood type. The test also finds out what type of blood your body will accept and matches it to the donor type.  If you are going to have a planned surgery, you may be able to donate your own blood. This may be done in case you need a transfusion.  You will have your temperature, blood pressure, and pulse checked.  You may receive medicine to help prevent an allergic reaction. This may be done if you have had a reaction to a transfusion before. This medicine may be given to you by mouth or through an IV tube.  This procedure lasts about 1-4 hours. Plan for the time you need. What happens during the procedure?   An IV tube will be put into one of your veins.  The bag of donated blood will be attached to your IV tube. Then, the blood will enter through your vein.  Your temperature, blood pressure, and pulse will be checked often. This is done to find early signs of a transfusion reaction.  Tell your nurse right away if you have any of these symptoms: ? Shortness of breath or trouble breathing. ? Chest or back  pain. ? Fever or chills. ? Red, swollen areas of skin or itching.  If you have any signs or symptoms of a reaction, your transfusion will be stopped. You may also be given medicine.  When the transfusion is finished, your IV tube will be taken out.  Pressure may be put on the IV site for a few minutes.  A bandage (dressing) will be put on the IV site. The procedure may vary among doctors and hospitals. What happens after the procedure?  You will be monitored until you leave the hospital or clinic. This includes checking your temperature, blood pressure, pulse, breathing rate, and blood oxygen level.  Your blood may be tested to see how you are responding to the transfusion.  You may be warmed with fluids or blankets. This is done to keep the temperature of your body normal.  If you have your procedure in an outpatient setting, you will be told whom to contact to report any reactions. Where to find more information To learn more, visit the [employer]: redcross.org Summary  A blood transfusion is a procedure in which you are given blood through an IV tube.  The blood may come from someone else (a donor). You may also be able to donate blood for yourself.  The blood you are given is made up of different blood cells. You may receive red blood cells, platelets, plasma, or white blood cells.  Your temperature, blood pressure, and pulse will be checked often.  After the procedure, your blood may be tested to see how you are responding. This information is not intended to replace advice given to you by your health care provider. Make sure you discuss any questions you have with your health care provider. Document Revised: 07/19/2018 Document Reviewed: 07/19/2018 Elsevier Patient Education  2020 [place] (COVID-19) Are you at risk?  Are you at risk for the Coronavirus (COVID-19)?  To be considered HIGH RISK for Coronavirus (COVID-19), you have to meet the  following criteria:  . Traveled to [place], [place], [place], [place] or [place]; or in the [place] to [place], [place], [place], or [place]; and have fever, cough, and shortness of breath within the last 2 weeks of travel OR . Been in close contact with a person diagnosed with COVID-19 within the last 2 weeks and have fever, cough, and shortness of breath . IF YOU DO NOT MEET THESE CRITERIA, YOU ARE CONSIDERED LOW RISK FOR COVID-19.  What to do if you are HIGH RISK for COVID-19?  Marland Kitchen If you are having a medical emergency, call 911. . Seek medical care right away. Before you go to a doctor's office, urgent care or emergency department, call ahead and tell them about your recent travel, contact with someone diagnosed with COVID-19, and your symptoms. You should receive instructions from your physician's office regarding next steps of care.  . When you arrive at healthcare provider, tell the healthcare staff immediately you have returned from visiting [place], [place], [place], [place] or [place]; or traveled in the [place] to [place], [place], [place], or [place]; in the last two weeks or you have been in close contact with a person diagnosed with COVID-19 in the last 2 weeks.   . Tell the health care staff about your symptoms: fever, cough and shortness of breath. . After you have been seen by a medical provider, you will be either: o Tested for (COVID-19) and discharged home on quarantine except to seek medical care if symptoms worsen, and asked to  - Stay home and avoid contact with others until you get your results (4-5 days)  - Avoid travel on public transportation if possible (such as bus, train, or airplane) or o Sent to the Emergency Department by EMS for evaluation, COVID-19 testing, and possible admission depending on your condition and test results.  What to do if you are LOW RISK for COVID-19?  Reduce your risk of any infection by using the same precautions used  for avoiding the common cold or flu:  Marland Kitchen Wash your hands often with soap and warm water for at least 20 seconds.  If soap and water are not readily available, use an alcohol-based hand sanitizer with  at least 60% alcohol.  . If coughing or sneezing, cover your mouth and nose by coughing or sneezing into the elbow areas of your shirt or coat, into a tissue or into your sleeve (not your hands). . Avoid shaking hands with others and consider head nods or verbal greetings only. . Avoid touching your eyes, nose, or mouth with unwashed hands.  . Avoid close contact with people who are sick. . Avoid places or events with large numbers of people in one location, like concerts or sporting events. . Carefully consider travel plans you have or are making. . If you are planning any travel outside or inside the [place], visit the CDC's Travelers' Health webpage for the latest health notices. . If you have some symptoms but not all symptoms, continue to monitor at home and seek medical attention if your symptoms worsen. . If you are having a medical emergency, call 911.   [place] / e-Visit: [email or web address]         [facility]: [facility]: [id number]                   [facility]: [phone]

## 2019-03-12 NOTE — Telephone Encounter (Signed)
[id number]: Call from lab/[facility]- PLT <5, Hgb 8.2. Dr. Irene Limbo informed 0900

## 2019-03-13 LAB — TYPE AND SCREEN
ABO/RH(D): A POS
Antibody Screen: NEGATIVE
Unit division: 0

## 2019-03-13 LAB — PREPARE PLATELET PHERESIS
Unit division: 0
Unit division: 0

## 2019-03-13 LAB — BPAM PLATELET PHERESIS
Blood Product Expiration Date: 202102022359
Blood Product Expiration Date: 202102032359
ISSUE DATE / TIME: 202102021127
ISSUE DATE / TIME: 202102021257
Unit Type and Rh: 1700
Unit Type and Rh: 6200

## 2019-03-13 LAB — BPAM RBC
Blood Product Expiration Date: 202102232359
ISSUE DATE / TIME: 202102021427
Unit Type and Rh: 6200

## 2019-03-15 ENCOUNTER — Telehealth: Payer: Self-pay | Admitting: Hematology

## 2019-03-15 NOTE — Telephone Encounter (Signed)
Scheduled per 02/02 los, called patient and left a voicemail.

## 2019-03-15 NOTE — Telephone Encounter (Signed)
Scheduled per 02/02 los, patient has been called and voicemail was left. Calender will be mailed.

## 2019-03-26 ENCOUNTER — Inpatient Hospital Stay: Payer: Medicare Other

## 2019-03-26 ENCOUNTER — Other Ambulatory Visit: Payer: Self-pay | Admitting: *Deleted

## 2019-03-26 ENCOUNTER — Other Ambulatory Visit: Payer: Self-pay

## 2019-03-26 DIAGNOSIS — D649 Anemia, unspecified: Secondary | ICD-10-CM

## 2019-03-26 DIAGNOSIS — D469 Myelodysplastic syndrome, unspecified: Secondary | ICD-10-CM

## 2019-03-26 DIAGNOSIS — D61818 Other pancytopenia: Secondary | ICD-10-CM

## 2019-03-26 LAB — CMP (CANCER CENTER ONLY)
ALT: 9 U/L (ref 0–44)
AST: 14 U/L — ABNORMAL LOW (ref 15–41)
Albumin: 4.1 g/dL (ref 3.5–5.0)
Alkaline Phosphatase: 60 U/L (ref 38–126)
Anion gap: 5 (ref 5–15)
BUN: 25 mg/dL — ABNORMAL HIGH (ref 8–23)
CO2: 26 mmol/L (ref 22–32)
Calcium: 8.5 mg/dL — ABNORMAL LOW (ref 8.9–10.3)
Chloride: 109 mmol/L (ref 98–111)
Creatinine: 0.89 mg/dL (ref 0.61–1.24)
GFR, Est AFR Am: >60 mL/min (ref >60)
GFR, Estimated: >60 mL/min (ref >60)
Glucose, Bld: 100 mg/dL — ABNORMAL HIGH (ref 70–99)
Potassium: 4.4 mmol/L (ref 3.5–5.1)
Sodium: 140 mmol/L (ref 135–145)
Total Bilirubin: 0.4 mg/dL (ref 0.3–1.2)
Total Protein: 6.5 g/dL (ref 6.5–8.1)

## 2019-03-26 LAB — CBC WITH DIFFERENTIAL/PLATELET
Abs Immature Granulocytes: 0 10*3/uL (ref 0.00–0.07)
Basophils Absolute: 0 10*3/uL (ref 0.0–0.1)
Basophils Relative: 0 %
Eosinophils Absolute: 0 10*3/uL (ref 0.0–0.5)
Eosinophils Relative: 0 %
HCT: 25.6 % — ABNORMAL LOW (ref 39.0–52.0)
Hemoglobin: 8.1 g/dL — ABNORMAL LOW (ref 13.0–17.0)
Immature Granulocytes: 0 %
Lymphocytes Relative: 52 %
Lymphs Abs: 0.8 10*3/uL (ref 0.7–4.0)
MCH: 29.9 pg (ref 26.0–34.0)
MCHC: 31.6 g/dL (ref 30.0–36.0)
MCV: 94.5 fL (ref 80.0–100.0)
Monocytes Absolute: 0.2 10*3/uL (ref 0.1–1.0)
Monocytes Relative: 10 %
Neutro Abs: 0.6 10*3/uL — ABNORMAL LOW (ref 1.7–7.7)
Neutrophils Relative %: 38 %
Platelets: <5 10*3/uL — CL (ref 150–400)
RBC: 2.71 MIL/uL — ABNORMAL LOW (ref 4.22–5.81)
RDW: 21.4 % — ABNORMAL HIGH (ref 11.5–15.5)
WBC: 1.6 10*3/uL — ABNORMAL LOW (ref 4.0–10.5)
nRBC: 0 % (ref 0.0–0.2)

## 2019-03-26 LAB — PREPARE RBC (CROSSMATCH)

## 2019-03-26 MED ORDER — SODIUM CHLORIDE 0.9% IV SOLUTION
250.0000 mL | Freq: Once | INTRAVENOUS | Status: AC
  Administered 2019-03-26: 250 mL via INTRAVENOUS
  Filled 2019-03-26: qty 250

## 2019-03-26 MED ORDER — ACETAMINOPHEN 325 MG PO TABS
ORAL_TABLET | ORAL | Status: AC
  Filled 2019-03-26: qty 2

## 2019-03-26 MED ORDER — METHYLPREDNISOLONE SODIUM SUCC 40 MG IJ SOLR
40.0000 mg | Freq: Once | INTRAMUSCULAR | Status: AC
  Administered 2019-03-26: 40 mg via INTRAVENOUS

## 2019-03-26 MED ORDER — ACETAMINOPHEN 325 MG PO TABS
650.0000 mg | ORAL_TABLET | Freq: Once | ORAL | Status: AC
  Administered 2019-03-26: 650 mg via ORAL

## 2019-03-26 MED ORDER — METHYLPREDNISOLONE SODIUM SUCC 40 MG IJ SOLR
INTRAMUSCULAR | Status: AC
  Filled 2019-03-26: qty 1

## 2019-03-26 NOTE — Patient Instructions (Signed)
Platelet Transfusion A platelet transfusion is a procedure in which you receive donated platelets through an IV. Platelets are tiny pieces of blood cells. When you get an injury, platelets clump together in the area to form a blood clot. This helps stop bleeding and is the beginning of the healing process. If you have too few platelets, your blood may have trouble clotting. This may cause you to bleed and bruise very easily. You may need a platelet transfusion if you have a condition that causes a low number of platelets (thrombocytopenia). A platelet transfusion may be used to stop or prevent excessive bleeding. Tell a health care provider about:  Any reactions you have had during previous transfusions.  Any allergies you have.  All medicines you are taking, including vitamins, herbs, eye drops, creams, and over-the-counter medicines.  Any blood disorders you have.  Any surgeries you have had.  Any medical conditions you have.  Whether you are pregnant or may be pregnant. What are the risks? Generally, this is a safe procedure. However, problems may occur, including:  Fever.  Infection.  Allergic reaction to the donor platelets.  Your body's disease-fighting system (immune system) attacking the donor platelets (hemolytic reaction). This is rare.  A rare reaction that causes lung damage (transfusion-related acute lung injury). What happens before the procedure? Medicines  Ask your health care provider about: ? Changing or stopping your regular medicines. This is especially important if you are taking diabetes medicines or blood thinners. ? Taking medicines such as aspirin and ibuprofen. These medicines can thin your blood. Do not take these medicines unless your health care provider tells you to take them. ? Taking over-the-counter medicines, vitamins, herbs, and supplements. General instructions  You will have a blood test to determine your blood type. Your blood type  determines what kind of platelets you will be given.  Follow instructions from your health care provider about eating or drinking restrictions.  If you have had an allergic reaction to a transfusion in the past, you may be given medicine to help prevent a reaction.  Your temperature, blood pressure, pulse, and breathing will be monitored. What happens during the procedure?   An IV will be inserted into one of your veins.  For your safety, two health care providers will verify your identity along with the donor platelets about to be infused.  A bag of donor platelets will be connected to your IV. The platelets will flow into your bloodstream. This usually takes 30-60 minutes.  Your temperature, blood pressure, pulse, and breathing will be monitored during the transfusion. This helps detect early signs of any reaction.  You will also be monitored for other symptoms that may indicate a reaction, including chills, hives, or itching.  If you have signs of a reaction at any time, your transfusion will be stopped, and you may be given medicine to help manage the reaction.  When your transfusion is complete, your IV will be removed.  Pressure may be applied to the IV site for a few minutes to stop any bleeding.  The IV site will be covered with a bandage (dressing). The procedure may vary among health care providers and hospitals. What happens after the procedure?  Your blood pressure, temperature, pulse, and breathing will be monitored until you leave the hospital or clinic.  You may have some bruising and soreness at your IV site. Follow these instructions at home: Medicines  Take over-the-counter and prescription medicines only as told by your health care provider.    Talk with your health care provider before you take any medicines that contain aspirin or NSAIDs. These medicines increase your risk for dangerous bleeding. General instructions  Change or remove your dressing as told  by your health care provider.  Return to your normal activities as told by your health care provider. Ask your health care provider what activities are safe for you.  Do not take baths, swim, or use a hot tub until your health care provider approves. Ask your health care provider if you may take showers.  Check your IV site every day for signs of infection. Check for: ? Redness, swelling, or pain. ? Fluid or blood. If fluid or blood drains from your IV site, use your hands to press down firmly on a bandage covering the area for a minute or two. Doing this should stop the bleeding. ? Warmth. ? Pus or a bad smell.  Keep all follow-up visits as told by your health care provider. This is important. Contact a health care provider if you have:  A headache that does not go away with medicine.  Hives, rash, or itchy skin.  Nausea or vomiting.  Unusual tiredness or weakness.  Signs of infection at your IV site. Get help right away if:  You have a fever or chills.  You urinate less often than usual.  Your urine is darker colored than normal.  You have any of the following: ? Trouble breathing. ? Pain in your back, abdomen, or chest. ? Cool, clammy skin. ? A fast heartbeat. Summary  Platelets are tiny pieces of blood cells that clump together to form a blood clot when you have an injury. If you have too few platelets, your blood may have trouble clotting.  A platelet transfusion is a procedure in which you receive donated platelets through an IV.  A platelet transfusion may be used to stop or prevent excessive bleeding.  After the procedure, check your IV site every day for signs of infection, including redness, swelling, pain, or warmth. This information is not intended to replace advice given to you by your health care provider. Make sure you discuss any questions you have with your health care provider. Document Revised: 03/01/2017 Document Reviewed: 03/01/2017 Elsevier  Patient Education  2020 [employer]   Blood Transfusion, Adult, Care After This sheet gives you information about how to care for yourself after your procedure. Your doctor may also give you more specific instructions. If you have problems or questions, contact your doctor. What can I expect after the procedure? After the procedure, it is common to have:  Bruising and soreness at the IV site.  A fever or chills on the day of the procedure. This may be your body's response to the new blood cells received.  A headache. Follow these instructions at home: Insertion site care      Follow instructions from your doctor about how to take care of your insertion site. This is where an IV tube was put into your vein. Make sure you: ? Wash your hands with soap and water before and after you change your bandage (dressing). If you cannot use soap and water, use hand sanitizer. ? Change your bandage as told by your doctor.  Check your insertion site every day for signs of infection. Check for: ? Redness, swelling, or pain. ? Bleeding from the site. ? Warmth. ? Pus or a bad smell. General instructions  Take over-the-counter and prescription medicines only as told by your doctor.  Rest   as told by your doctor.  Go back to your normal activities as told by your doctor.  Keep all follow-up visits as told by your doctor. This is important. Contact a doctor if:  You have itching or red, swollen areas of skin (hives).  You feel worried or nervous (anxious).  You feel weak after doing your normal activities.  You have redness, swelling, warmth, or pain around the insertion site.  You have blood coming from the insertion site, and the blood does not stop with pressure.  You have pus or a bad smell coming from the insertion site. Get help right away if:  You have signs of a serious reaction. This may be coming from an allergy or the body's defense system (immune system). Signs  include: ? Trouble breathing or shortness of breath. ? Swelling of the face or feeling warm (flushed). ? Fever or chills. ? Head, chest, or back pain. ? Dark pee (urine) or blood in the pee. ? Widespread rash. ? Fast heartbeat. ? Feeling dizzy or light-headed. You may receive your blood transfusion in an outpatient setting. If so, you will be told whom to contact to report any reactions. These symptoms may be an emergency. Do not wait to see if the symptoms will go away. Get medical help right away. Call your local emergency services (911 in the [place] Do not drive yourself to the hospital. Summary  Bruising and soreness at the IV site are common.  Check your insertion site every day for signs of infection.  Rest as told by your doctor. Go back to your normal activities as told by your doctor.  Get help right away if you have signs of a serious reaction. This information is not intended to replace advice given to you by your health care provider. Make sure you discuss any questions you have with your health care provider. Document Revised: 07/19/2018 Document Reviewed: 07/19/2018 Elsevier Patient Education  2020 [employer]  

## 2019-03-27 LAB — TYPE AND SCREEN
ABO/RH(D): A POS
Antibody Screen: NEGATIVE
Unit division: 0

## 2019-03-27 LAB — BPAM RBC
Blood Product Expiration Date: 202103192359
ISSUE DATE / TIME: 202102161315
Unit Type and Rh: 6200

## 2019-03-27 LAB — PREPARE PLATELET PHERESIS
Unit division: 0
Unit division: 0
Unit division: 0

## 2019-03-27 LAB — BPAM PLATELET PHERESIS
Blood Product Expiration Date: 202102172359
Blood Product Expiration Date: 202102172359
Blood Product Expiration Date: 202102182359
ISSUE DATE / TIME: 202102161511
ISSUE DATE / TIME: 202102161551
ISSUE DATE / TIME: 202102170009
Unit Type and Rh: 6200
Unit Type and Rh: 6200
Unit Type and Rh: 7300

## 2019-04-05 ENCOUNTER — Telehealth: Payer: Self-pay | Admitting: Hematology

## 2019-04-05 NOTE — Telephone Encounter (Signed)
Returned patient's phone call regarding an appointment, left a voicemail.

## 2019-04-09 ENCOUNTER — Other Ambulatory Visit: Payer: Self-pay

## 2019-04-09 ENCOUNTER — Inpatient Hospital Stay: Payer: Medicare Other | Attending: Hematology

## 2019-04-09 ENCOUNTER — Inpatient Hospital Stay: Payer: Medicare Other

## 2019-04-09 DIAGNOSIS — D649 Anemia, unspecified: Secondary | ICD-10-CM

## 2019-04-09 DIAGNOSIS — D696 Thrombocytopenia, unspecified: Secondary | ICD-10-CM

## 2019-04-09 DIAGNOSIS — D469 Myelodysplastic syndrome, unspecified: Secondary | ICD-10-CM

## 2019-04-09 DIAGNOSIS — D61818 Other pancytopenia: Secondary | ICD-10-CM

## 2019-04-09 LAB — CBC WITH DIFFERENTIAL/PLATELET
Abs Immature Granulocytes: 0 10*3/uL (ref 0.00–0.07)
Basophils Absolute: 0 10*3/uL (ref 0.0–0.1)
Basophils Relative: 0 %
Eosinophils Absolute: 0 10*3/uL (ref 0.0–0.5)
Eosinophils Relative: 0 %
HCT: 26 % — ABNORMAL LOW (ref 39.0–52.0)
Hemoglobin: 8.2 g/dL — ABNORMAL LOW (ref 13.0–17.0)
Immature Granulocytes: 0 %
Lymphocytes Relative: 46 %
Lymphs Abs: 0.6 10*3/uL — ABNORMAL LOW (ref 0.7–4.0)
MCH: 29.9 pg (ref 26.0–34.0)
MCHC: 31.5 g/dL (ref 30.0–36.0)
MCV: 94.9 fL (ref 80.0–100.0)
Monocytes Absolute: 0.2 10*3/uL (ref 0.1–1.0)
Monocytes Relative: 12 %
Neutro Abs: 0.6 10*3/uL — ABNORMAL LOW (ref 1.7–7.7)
Neutrophils Relative %: 42 %
Platelets: <5 10*3/uL — CL (ref 150–400)
RBC: 2.74 MIL/uL — ABNORMAL LOW (ref 4.22–5.81)
RDW: 20.9 % — ABNORMAL HIGH (ref 11.5–15.5)
WBC: 1.3 10*3/uL — ABNORMAL LOW (ref 4.0–10.5)
nRBC: 0 % (ref 0.0–0.2)

## 2019-04-09 LAB — SAMPLE TO BLOOD BANK

## 2019-04-09 MED ORDER — ACETAMINOPHEN 325 MG PO TABS
650.0000 mg | ORAL_TABLET | Freq: Once | ORAL | Status: AC
  Administered 2019-04-09: 650 mg via ORAL

## 2019-04-09 MED ORDER — SODIUM CHLORIDE 0.9% IV SOLUTION
250.0000 mL | Freq: Once | INTRAVENOUS | Status: AC
  Administered 2019-04-09: 250 mL via INTRAVENOUS
  Filled 2019-04-09: qty 250

## 2019-04-09 MED ORDER — METHYLPREDNISOLONE SODIUM SUCC 40 MG IJ SOLR
40.0000 mg | Freq: Once | INTRAMUSCULAR | Status: AC
  Administered 2019-04-09: 40 mg via INTRAVENOUS

## 2019-04-09 MED ORDER — METHYLPREDNISOLONE SODIUM SUCC 40 MG IJ SOLR
INTRAMUSCULAR | Status: AC
  Filled 2019-04-09: qty 1

## 2019-04-09 MED ORDER — ACETAMINOPHEN 325 MG PO TABS
ORAL_TABLET | ORAL | Status: AC
  Filled 2019-04-09: qty 2

## 2019-04-09 NOTE — Patient Instructions (Signed)
Platelet Transfusion A platelet transfusion is a procedure in which you receive donated platelets through an IV. Platelets are tiny pieces of blood cells. When you get an injury, platelets clump together in the area to form a blood clot. This helps stop bleeding and is the beginning of the healing process. If you have too few platelets, your blood may have trouble clotting. This may cause you to bleed and bruise very easily. You may need a platelet transfusion if you have a condition that causes a low number of platelets (thrombocytopenia). A platelet transfusion may be used to stop or prevent excessive bleeding. Tell a health care provider about:  Any reactions you have had during previous transfusions.  Any allergies you have.  All medicines you are taking, including vitamins, herbs, eye drops, creams, and over-the-counter medicines.  Any blood disorders you have.  Any surgeries you have had.  Any medical conditions you have.  Whether you are pregnant or may be pregnant. What are the risks? Generally, this is a safe procedure. However, problems may occur, including:  Fever.  Infection.  Allergic reaction to the donor platelets.  Your body's disease-fighting system (immune system) attacking the donor platelets (hemolytic reaction). This is rare.  A rare reaction that causes lung damage (transfusion-related acute lung injury). What happens before the procedure? Medicines  Ask your health care provider about: ? Changing or stopping your regular medicines. This is especially important if you are taking diabetes medicines or blood thinners. ? Taking medicines such as aspirin and ibuprofen. These medicines can thin your blood. Do not take these medicines unless your health care provider tells you to take them. ? Taking over-the-counter medicines, vitamins, herbs, and supplements. General instructions  You will have a blood test to determine your blood type. Your blood type  determines what kind of platelets you will be given.  Follow instructions from your health care provider about eating or drinking restrictions.  If you have had an allergic reaction to a transfusion in the past, you may be given medicine to help prevent a reaction.  Your temperature, blood pressure, pulse, and breathing will be monitored. What happens during the procedure?   An IV will be inserted into one of your veins.  For your safety, two health care providers will verify your identity along with the donor platelets about to be infused.  A bag of donor platelets will be connected to your IV. The platelets will flow into your bloodstream. This usually takes 30-60 minutes.  Your temperature, blood pressure, pulse, and breathing will be monitored during the transfusion. This helps detect early signs of any reaction.  You will also be monitored for other symptoms that may indicate a reaction, including chills, hives, or itching.  If you have signs of a reaction at any time, your transfusion will be stopped, and you may be given medicine to help manage the reaction.  When your transfusion is complete, your IV will be removed.  Pressure may be applied to the IV site for a few minutes to stop any bleeding.  The IV site will be covered with a bandage (dressing). The procedure may vary among health care providers and hospitals. What happens after the procedure?  Your blood pressure, temperature, pulse, and breathing will be monitored until you leave the hospital or clinic.  You may have some bruising and soreness at your IV site. Follow these instructions at home: Medicines  Take over-the-counter and prescription medicines only as told by your health care provider.    Talk with your health care provider before you take any medicines that contain aspirin or NSAIDs. These medicines increase your risk for dangerous bleeding. General instructions  Change or remove your dressing as told  by your health care provider.  Return to your normal activities as told by your health care provider. Ask your health care provider what activities are safe for you.  Do not take baths, swim, or use a hot tub until your health care provider approves. Ask your health care provider if you may take showers.  Check your IV site every day for signs of infection. Check for: ? Redness, swelling, or pain. ? Fluid or blood. If fluid or blood drains from your IV site, use your hands to press down firmly on a bandage covering the area for a minute or two. Doing this should stop the bleeding. ? Warmth. ? Pus or a bad smell.  Keep all follow-up visits as told by your health care provider. This is important. Contact a health care provider if you have:  A headache that does not go away with medicine.  Hives, rash, or itchy skin.  Nausea or vomiting.  Unusual tiredness or weakness.  Signs of infection at your IV site. Get help right away if:  You have a fever or chills.  You urinate less often than usual.  Your urine is darker colored than normal.  You have any of the following: ? Trouble breathing. ? Pain in your back, abdomen, or chest. ? Cool, clammy skin. ? A fast heartbeat. Summary  Platelets are tiny pieces of blood cells that clump together to form a blood clot when you have an injury. If you have too few platelets, your blood may have trouble clotting.  A platelet transfusion is a procedure in which you receive donated platelets through an IV.  A platelet transfusion may be used to stop or prevent excessive bleeding.  After the procedure, check your IV site every day for signs of infection, including redness, swelling, pain, or warmth. This information is not intended to replace advice given to you by your health care provider. Make sure you discuss any questions you have with your health care provider. Document Revised: 03/01/2017 Document Reviewed: 03/01/2017 Elsevier  Patient Education  2020 [employer]   Blood Transfusion, Adult, Care After This sheet gives you information about how to care for yourself after your procedure. Your doctor may also give you more specific instructions. If you have problems or questions, contact your doctor. What can I expect after the procedure? After the procedure, it is common to have:  Bruising and soreness at the IV site.  A fever or chills on the day of the procedure. This may be your body's response to the new blood cells received.  A headache. Follow these instructions at home: Insertion site care      Follow instructions from your doctor about how to take care of your insertion site. This is where an IV tube was put into your vein. Make sure you: ? Wash your hands with soap and water before and after you change your bandage (dressing). If you cannot use soap and water, use hand sanitizer. ? Change your bandage as told by your doctor.  Check your insertion site every day for signs of infection. Check for: ? Redness, swelling, or pain. ? Bleeding from the site. ? Warmth. ? Pus or a bad smell. General instructions  Take over-the-counter and prescription medicines only as told by your doctor.  Rest   as told by your doctor.  Go back to your normal activities as told by your doctor.  Keep all follow-up visits as told by your doctor. This is important. Contact a doctor if:  You have itching or red, swollen areas of skin (hives).  You feel worried or nervous (anxious).  You feel weak after doing your normal activities.  You have redness, swelling, warmth, or pain around the insertion site.  You have blood coming from the insertion site, and the blood does not stop with pressure.  You have pus or a bad smell coming from the insertion site. Get help right away if:  You have signs of a serious reaction. This may be coming from an allergy or the body's defense system (immune system). Signs  include: ? Trouble breathing or shortness of breath. ? Swelling of the face or feeling warm (flushed). ? Fever or chills. ? Head, chest, or back pain. ? Dark pee (urine) or blood in the pee. ? Widespread rash. ? Fast heartbeat. ? Feeling dizzy or light-headed. You may receive your blood transfusion in an outpatient setting. If so, you will be told whom to contact to report any reactions. These symptoms may be an emergency. Do not wait to see if the symptoms will go away. Get medical help right away. Call your local emergency services (911 in the [place] Do not drive yourself to the hospital. Summary  Bruising and soreness at the IV site are common.  Check your insertion site every day for signs of infection.  Rest as told by your doctor. Go back to your normal activities as told by your doctor.  Get help right away if you have signs of a serious reaction. This information is not intended to replace advice given to you by your health care provider. Make sure you discuss any questions you have with your health care provider. Document Revised: 07/19/2018 Document Reviewed: 07/19/2018 Elsevier Patient Education  2020 [employer]  

## 2019-04-10 LAB — BPAM PLATELET PHERESIS
Blood Product Expiration Date: 202103042359
Blood Product Expiration Date: 202103052359
ISSUE DATE / TIME: 202103021147
ISSUE DATE / TIME: 202103021252
Unit Type and Rh: 6200
Unit Type and Rh: 6200

## 2019-04-10 LAB — PREPARE PLATELET PHERESIS
Unit division: 0
Unit division: 0

## 2019-04-22 ENCOUNTER — Telehealth: Payer: Self-pay | Admitting: *Deleted

## 2019-04-22 NOTE — Telephone Encounter (Signed)
Wife called - left VM. Patient developed spot over an ear last week that bled. It has continued to ooze blood, has increased to size of quarter and is now scabbed over. She called PCP to make appt for patient (she did not give appt time). Because pt has appt at [facility] this Tuesday for blood trans, wife wants to know if Dr. Irene Limbo would see patient then. Dr. Irene Limbo informed. Dr. Irene Limbo recommends patient see PCP as they planned to evaluate site of bleeding. Patient has appt here for lab work and will receive PRBCs and/or platelets depending on blood counts which should help with bleeding. Contacted Ms.Teaney with Dr. Grier Mitts response. She verbalized understanding and states she will contact PCP to make appt for patient to be seen as soon as possible

## 2019-04-23 ENCOUNTER — Inpatient Hospital Stay: Payer: Medicare Other

## 2019-04-23 ENCOUNTER — Telehealth: Payer: Self-pay | Admitting: *Deleted

## 2019-04-23 ENCOUNTER — Other Ambulatory Visit: Payer: Self-pay | Admitting: *Deleted

## 2019-04-23 ENCOUNTER — Other Ambulatory Visit: Payer: Self-pay

## 2019-04-23 DIAGNOSIS — D469 Myelodysplastic syndrome, unspecified: Secondary | ICD-10-CM

## 2019-04-23 DIAGNOSIS — D696 Thrombocytopenia, unspecified: Secondary | ICD-10-CM

## 2019-04-23 DIAGNOSIS — D649 Anemia, unspecified: Secondary | ICD-10-CM

## 2019-04-23 LAB — CBC WITH DIFFERENTIAL/PLATELET
Abs Immature Granulocytes: 0.06 10*3/uL (ref 0.00–0.07)
Basophils Absolute: 0 10*3/uL (ref 0.0–0.1)
Basophils Relative: 0 %
Eosinophils Absolute: 0 10*3/uL (ref 0.0–0.5)
Eosinophils Relative: 0 %
HCT: 22.7 % — ABNORMAL LOW (ref 39.0–52.0)
Hemoglobin: 7.1 g/dL — ABNORMAL LOW (ref 13.0–17.0)
Immature Granulocytes: 2 %
Lymphocytes Relative: 25 %
Lymphs Abs: 0.9 10*3/uL (ref 0.7–4.0)
MCH: 30.1 pg (ref 26.0–34.0)
MCHC: 31.3 g/dL (ref 30.0–36.0)
MCV: 96.2 fL (ref 80.0–100.0)
Monocytes Absolute: 0.5 10*3/uL (ref 0.1–1.0)
Monocytes Relative: 14 %
Neutro Abs: 2 10*3/uL (ref 1.7–7.7)
Neutrophils Relative %: 59 %
Platelets: <5 10*3/uL — CL (ref 150–400)
RBC: 2.36 MIL/uL — ABNORMAL LOW (ref 4.22–5.81)
RDW: 22.7 % — ABNORMAL HIGH (ref 11.5–15.5)
WBC: 3.4 10*3/uL — ABNORMAL LOW (ref 4.0–10.5)
nRBC: 0 % (ref 0.0–0.2)

## 2019-04-23 LAB — SAMPLE TO BLOOD BANK

## 2019-04-23 LAB — PREPARE RBC (CROSSMATCH)

## 2019-04-23 MED ORDER — ACETAMINOPHEN 325 MG PO TABS
ORAL_TABLET | ORAL | Status: AC
  Filled 2019-04-23: qty 2

## 2019-04-23 MED ORDER — ACETAMINOPHEN 325 MG PO TABS
650.0000 mg | ORAL_TABLET | Freq: Once | ORAL | Status: AC
  Administered 2019-04-23: 650 mg via ORAL

## 2019-04-23 MED ORDER — METHYLPREDNISOLONE SODIUM SUCC 125 MG IJ SOLR
INTRAMUSCULAR | Status: AC
  Filled 2019-04-23: qty 2

## 2019-04-23 MED ORDER — METHYLPREDNISOLONE SODIUM SUCC 125 MG IJ SOLR
60.0000 mg | Freq: Once | INTRAMUSCULAR | Status: AC
  Administered 2019-04-23: 60 mg via INTRAVENOUS

## 2019-04-23 MED ORDER — SODIUM CHLORIDE 0.9% IV SOLUTION
250.0000 mL | Freq: Once | INTRAVENOUS | Status: AC
  Administered 2019-04-23: 250 mL via INTRAVENOUS
  Filled 2019-04-23: qty 250

## 2019-04-23 NOTE — Telephone Encounter (Signed)
1038 - lab/Jay called: Platelets less than 5, Hgb 7.1. 1042 - Dr. Irene Limbo informed

## 2019-04-24 LAB — PREPARE PLATELET PHERESIS
Unit division: 0
Unit division: 0

## 2019-04-24 LAB — BPAM RBC
Blood Product Expiration Date: 202104042359
ISSUE DATE / TIME: 202103161234
Unit Type and Rh: 6200

## 2019-04-24 LAB — TYPE AND SCREEN
ABO/RH(D): A POS
Antibody Screen: NEGATIVE
Unit division: 0

## 2019-04-24 LAB — BPAM PLATELET PHERESIS
Blood Product Expiration Date: 202103182359
Blood Product Expiration Date: 202103182359
ISSUE DATE / TIME: 202103161144
ISSUE DATE / TIME: 202103161231
Unit Type and Rh: 6200
Unit Type and Rh: 6200

## 2019-05-06 NOTE — Progress Notes (Signed)
HEMATOLOGY/ONCOLOGY CLINIC NOTE  Date of Service: 05/07/2019  Patient Care Team: Shaun Noon, MD as PCP - General (Family Medicine)  CHIEF COMPLAINTS/PURPOSE OF CONSULTATION:  Myelodysplastic Syndrome  Oncologic History:   Shaun Hobbs initially presented to care with pancytopenia and was evaluated with a BM Bx on 05/16/06 which did not reveal evidence of abnormal myeloid maturation or other abnormalities, and was without chromosomal abnormality as well. A BM Bx was repeated on 08/28/07 which did reveal dyserythropoiesis and some dysplasia. A 04/17/12 Bm biopsy revealed mild dysplasia, hypercellular marrow with 1.5% blasts, and a minute CD5 positive monoclonal B-cell population detected by flow. 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 His most recent 12/27/17 Bm Bx revealed 30% cellularity with trilineage hematopoiesis, mild megakaryocytic hyperplasia with dyspoietic changes, mild erythroid hyperplasia with ringed sideroblasts, and a minute population of monoclonal B-cells detected by flow.   The pt started 78m Revlimid for 3 months, beginning 09/03/10 "without noticeable improvement." He later began Vidaza, and completed his 21st cycle the week of November 30, 2017.  HISTORY OF PRESENTING ILLNESS:   Shaun Hobbs a wonderful 86y.o. male who [facility] [facility] [place] [place] [place] My[place] Syndrome. He is accompanied today by his wife and daughter. The pt reports that he is doing well overall.  The pt's wife reports that the first thing that occurred related to the patient's MDS was that his PLT were seen to be reduced to 99k, 12 years ago. The pt was evaluated with repeat BM biopsies over the last 12 years, and began Revlimid  for only 3 months in 2012. The pt's wife notes that he began Vidaza a little less than two years ago, after his PLT dropped to about 60k, and his last dose of Vidaza was in late October 2019. The pt has had one blood transfusion ever, which was in November 2019. The pt's wife notes that during this time of not taking Vidaza, she has not noticed a change in her husband's energy levels or day to day activities. The pt denies dizziness, light headedness, fatigue, abnormal bruising, nose bleeds, gum bleeds, or other concerns for bleeding. The pt and wife deny recent or frequent infections.  The pt's wife notes that Vidaza was stopped to "take a break," trend his counts, and to move from Shaun Hobbs His last BM Bx was November 2019.  The pt and his wife note that there have not been other recent medical concerns. She notes that the pt's last A1C was 4.9.  The pt's wife notes that he is current with his annual flu vaccine, both every 5-year Prevnar and Pneumovax, and shingles vaccine.  The pt's wife notes that the pt's memory is "slowly decreasing," and he no longer drives. The pt's wife notes that [facility] [place] [facility] [place] was current memory loss," and has taken Aricept for 20 years.  Most recent lab results (02/19/18) of CBC w/diff and CMP is as follows: all values are WNL except for WBC at 1.5k, RBC at 2.42, HGB at 7.9, HCT at 23.3, MCH at 32.2, PLT at 28k, RDW at 18.8, ANC at 700, Lymphocytes at 600, Monocytes at 200, Glucose at 111, BUN at 28.  On review of systems, pt  reports stable energy levels, eating well, stable weight, good appetite, and denies fevers, fatigue, light headedness, dizziness, nose bleeds, gum bleeds, abnormal bruising, other concerns for bleeding, falls, recent infections, frequent infections, bone pains, problems passing urine, abdominal pains, lower abdominal pains, and any other symptoms.  On PMHx the pt reports MDS, HLD, Early onset Alzheimer's  dementia, Hypothyroidism, hypertrophy or prostate with urinary obstruction.   Interval History:   Shaun Hobbs returns today for management and evaluation of his MDS. We are joined today by Shaun Hobbs. The patient's last visit with [place] was on 03/12/2019. The pt reports that he is doing well overall.  The pt reports that he has been feeling pretty good and has been eating well. He has had no nosebleeds or gum bleeds and the wound on the right side of his head has also stopped bleeding. Shaun Hobbs notes that Shaun Hobbs has dramatically helped pt with his bleeding. Pt has had both doses of the COVD19 vaccine and tolerated them well.   Lab results today (05/07/19) of CBC w/diff is as follows: all values are WNL except for WBC at 2.9K. RBC at 2.85, Hgb at 8.5, HCT at 28.1, RDW at 22.1, PLT at 6K, RBC Morphology shows "Anisocytosis", Schistocytes are "Present", Tear Drop Cells are "Present", Ovalocytes are "Present".  On review of systems, pt reports healthy appetite and denies nosebleeds, gum bleeds, abdominal pain, hematuria and any other symptoms.    MEDICAL HISTORY:  MDS, HLD, Early onset Alzheimer's dementia, Hypothyroidism, hypertrophy or prostate with urinary obstruction.   SURGICAL HISTORY: BM Bx   SOCIAL HISTORY: Social History   Socioeconomic History  . Marital status: Married    Spouse name: Not on file  . Number of children: Not on file  . Years of education: Not on file  . Highest education level: Not on file  Occupational History  . Not on file  Tobacco Use  . Smoking status: Never Smoker  . Smokeless tobacco: Never Used  Substance and Sexual Activity  . Alcohol use: Not Currently  . Drug use: Never  . Sexual activity: Not Currently  Other Topics Concern  . Not on file  Social History Narrative  . Not on file   Social Determinants of Health   Financial Resource Strain:   . Difficulty of Paying Living Expenses:   Food Insecurity:   . Worried About Sales executive in the Last Year:   . Arboriculturist in the Last Year:   Transportation Needs:   . Film/video editor (Medical):   Marland Kitchen Lack of Transportation (Non-Medical):   Physical Activity:   . Days of Exercise per Week:   . Minutes of Exercise per Session:   Stress:   . Feeling of Stress :   Social Connections:   . Frequency of Communication with Friends and Family:   . Frequency of Social Gatherings with Friends and Family:   . Attends Religious Services:   . Active Member of Clubs or Organizations:   . Attends Archivist Meetings:   Marland Kitchen Marital Status:   Intimate Partner Violence:   . Fear of Current or Ex-Partner:   . Emotionally Abused:   Marland Kitchen Physically Abused:   . Sexually Abused:     FAMILY HISTORY: No family history on file.  ALLERGIES:  is allergic to sulfa antibiotics.  MEDICATIONS:  Current Outpatient Medications  Medication Sig Dispense Refill  . Ascorbic Acid 120 MG CHEW Chew by mouth.    Marland Kitchen  atorvastatin (LIPITOR) 10 MG tablet Take 5 mg by mouth daily.    . Cholecalciferol (VITAMIN D3 PO) Take 1,000 Units by mouth daily.     Marland Kitchen donepezil (ARICEPT) 10 MG tablet Take 10 mg by mouth at bedtime.    Marland Kitchen levothyroxine (SYNTHROID, LEVOTHROID) 100 MCG tablet Take 100 mcg by mouth daily before breakfast.    . MAGNESIUM PO Take 400 mg by mouth daily.     . mupirocin ointment (BACTROBAN) 2 % Apply 1 application topically 2 (two) times daily. To area of induration on forearm 22 g 2  . sertraline (ZOLOFT) 100 MG tablet Take 100 mg by mouth at bedtime.    . tamsulosin (FLOMAX) 0.4 MG CAPS capsule Take 0.4 mg by mouth at bedtime.    . tranexamic acid (LYSTEDA) 650 MG TABS tablet Take 1 tablet (650 mg total) by mouth 2 (two) times daily. 60 tablet 3   No current facility-administered medications for this visit.    REVIEW OF SYSTEMS:   A 10+ POINT REVIEW OF SYSTEMS WAS OBTAINED including neurology, dermatology, psychiatry, cardiac, respiratory, lymph, extremities, GI, GU,  Musculoskeletal, constitutional, breasts, reproductive, HEENT.  All pertinent positives are noted in the HPI.  All others are negative.   PHYSICAL EXAMINATION: ECOG PERFORMANCE STATUS: 2-3   . Vitals:   05/07/19 1134  BP: (!) 113/58  Pulse: 73  Resp: 18  Temp: 98.7 F (37.1 C)  SpO2: 100%   Filed Weights   05/07/19 1134  Weight: 164 lb 1.6 oz (74.4 kg)   .Body mass index is 23.21 kg/m.  Exam was given in a chair   GENERAL:alert, in no acute distress and comfortable SKIN: no acute rashes, no significant lesions EYES: conjunctiva are pink and non-injected, sclera anicteric OROPHARYNX: MMM, no exudates, no oropharyngeal erythema or ulceration NECK: supple, no JVD LYMPH:  no palpable lymphadenopathy in the cervical, axillary or inguinal regions LUNGS: clear to auscultation b/l with normal respiratory effort HEART: regular rate & rhythm ABDOMEN:  normoactive bowel sounds , non tender, not distended. No palpable hepatosplenomegaly.  Extremity: no pedal edema PSYCH: alert & oriented x 3 with fluent speech NEURO: no focal motor/sensory deficits  LABORATORY DATA:  I have reviewed the data as listed  . CBC Latest Ref Rng & Units 05/07/2019 04/23/2019 04/09/2019  WBC 4.0 - 10.5 K/uL 2.9(L) 3.4(L) 1.3(L)  Hemoglobin 13.0 - 17.0 g/dL 8.5(L) 7.1(L) 8.2(L)  Hematocrit 39.0 - 52.0 % 28.1(L) 22.7(L) 26.0(L)  Platelets 150 - 400 K/uL 6(LL) <5(LL) <5(LL)   ANC 500 . CMP Latest Ref Rng & Units 03/26/2019 02/26/2019 01/15/2019  Glucose 70 - 99 mg/dL 100(H) 130(H) 124(H)  BUN 8 - 23 mg/dL 25(H) 20 15  Creatinine 0.61 - 1.24 mg/dL 0.89 0.92 0.80  Sodium 135 - 145 mmol/L 140 139 138  Potassium 3.5 - 5.1 mmol/L 4.4 4.1 4.2  Chloride 98 - 111 mmol/L 109 108 105  CO2 22 - 32 mmol/L _0 Calcium 8.9 - 10.3 mg/dL 8.5(L) 8.2(L) 8.2(L)  Total Protein 6.5 - 8.1 g/dL 6.5 6.2(L) 6.0(L)  Total Bilirubin 0.3 - 1.2 mg/dL 0.4 0.5 0.4  Alkaline Phos 38 - 126 U/L 60 52 56  AST 15 - 41 U/L 14(L)  12(L) 14(L)  ALT 0 - 44 U/L 9 <6 8    CBC w/diff and CMP:    11/19/15 BM Bx:   11/19/15 Cytogenetics:   12/27/17 Bm Bx:    RADIOGRAPHIC STUDIES: I have personally reviewed the radiological images as  listed and agreed with the findings in the report. No results found.  ASSESSMENT & PLAN:  86 y.o. male with:  1. 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 No evidence for a lymphoproliferative disorder. Normal cytogenetics. 08/28/07 BM Bx revealed mild dyserythropoiesis and a suggestion of some dysplasia within the myeloid series. 04/17/12 BM Bx revealed hypercellular marrow with 50% cellularity, 1.5% blasts, with trilineage hematopoiesis, marrow monocytosis, mild myeloid and megakaryocytic dysplasia, adequate storage and iron utilization, no ring sideroblasts. Small monoclonal B lymphocytic population of uncertain significance. 11/19/15 BM Bx revealed Hypercellular marrow for age about 70% with panmyelosis including increased megakaryocytes with atypia, moderate marrow monocytosis about 15-20% and <5% blasts. Minute CD5 positive monoclonal B-cell population. Storage iron present. 12/27/17 BM Bx revealed normocellular marrow for age with 30% cellularity and trilineage hematopoiesis, mild megakaryocytic hyperplasia with dyspoietic changes, mild erythroid hyperplasia with ringed sideroblasts, and minute population of monoclonal B cells detected by flow.  S/p 21 cycles of Vidaza completed in the week of November 30, 2017. Previous oncologic notes suggest that the patient's pancytopenia was continuing to worsen through [place] treatment.  Labs upon initial presentation from 02/19/18, WBC at 1.5k, HGB at 7.9, PLT at 28k, [place] at 700   PLAN: -Discussed pt labwork today, 05/07/19;  Hgb is stable - better today, WBC are holding, PLT are still very low.  -No indication for PRBC transfusion today, Hgb >8. Will given 2 units of platelets -Will continue 1 unit of PRBC and 2 units of PLT every 2 weeks as needed  -Will continue Lysteda, as the risk of bleeding is greater than risk of clotting at this time -Advised pt that Zoloft can affect platelet function, recommend lowering dose or discontinuing medication -Recommend pt f/u with Dr. Melford Aase to discuss Zoloft  -Will give pt 2 units of PLT today -Refill Lysteda -Will see back in 12 weeks with labs   FOLLOW UP: Plz schedule 1 unit of PRBC and 2 units of Platelets q2weeks x 8 RTC with Dr Irene Limbo in 12 weeks   The total time spent in the appt was 20 minutes and more than 50% was on counseling and direct patient cares.  All of the patient's questions were answered with apparent satisfaction. The patient knows to call the clinic with any problems, questions or concerns.    Sullivan Lone MD [place] AAHIVMS [facility] Hematology/Oncology Physician [facility]  (Office):       [phone] (Work cell):  [phone] (Fax):           [phone]  05/07/2019 11:53 AM  I, Yevette Edwards, am acting as a scribe for Dr. Sullivan Lone.   .I have reviewed the above documentation for accuracy and completeness, and I agree with the above. Brunetta Genera MD

## 2019-05-07 ENCOUNTER — Other Ambulatory Visit: Payer: Self-pay

## 2019-05-07 ENCOUNTER — Inpatient Hospital Stay (HOSPITAL_BASED_OUTPATIENT_CLINIC_OR_DEPARTMENT_OTHER): Payer: Medicare Other | Admitting: Hematology

## 2019-05-07 ENCOUNTER — Inpatient Hospital Stay: Payer: Medicare Other

## 2019-05-07 ENCOUNTER — Other Ambulatory Visit: Payer: Self-pay | Admitting: *Deleted

## 2019-05-07 VITALS — BP 113/58 | HR 73 | Temp 98.7°F | Resp 18 | Ht 70.5 in | Wt 164.1 lb

## 2019-05-07 DIAGNOSIS — D649 Anemia, unspecified: Secondary | ICD-10-CM | POA: Diagnosis not present

## 2019-05-07 DIAGNOSIS — D469 Myelodysplastic syndrome, unspecified: Secondary | ICD-10-CM

## 2019-05-07 DIAGNOSIS — D696 Thrombocytopenia, unspecified: Secondary | ICD-10-CM

## 2019-05-07 LAB — CBC WITH DIFFERENTIAL/PLATELET
Abs Immature Granulocytes: 0.03 10*3/uL (ref 0.00–0.07)
Basophils Absolute: 0 10*3/uL (ref 0.0–0.1)
Basophils Relative: 0 %
Eosinophils Absolute: 0 10*3/uL (ref 0.0–0.5)
Eosinophils Relative: 1 %
HCT: 28.1 % — ABNORMAL LOW (ref 39.0–52.0)
Hemoglobin: 8.5 g/dL — ABNORMAL LOW (ref 13.0–17.0)
Immature Granulocytes: 1 %
Lymphocytes Relative: 30 %
Lymphs Abs: 0.9 10*3/uL (ref 0.7–4.0)
MCH: 29.8 pg (ref 26.0–34.0)
MCHC: 30.2 g/dL (ref 30.0–36.0)
MCV: 98.6 fL (ref 80.0–100.0)
Monocytes Absolute: 0.2 10*3/uL (ref 0.1–1.0)
Monocytes Relative: 7 %
Neutro Abs: 1.8 10*3/uL (ref 1.7–7.7)
Neutrophils Relative %: 61 %
Platelets: 6 10*3/uL — CL (ref 150–400)
RBC: 2.85 MIL/uL — ABNORMAL LOW (ref 4.22–5.81)
RDW: 22.1 % — ABNORMAL HIGH (ref 11.5–15.5)
WBC: 2.9 10*3/uL — ABNORMAL LOW (ref 4.0–10.5)
nRBC: 0 % (ref 0.0–0.2)

## 2019-05-07 MED ORDER — TRANEXAMIC ACID 650 MG PO TABS: 650.0000 mg | ORAL_TABLET | Freq: Two times a day (BID) | ORAL | 3 refills | Status: DC

## 2019-05-07 MED ORDER — SODIUM CHLORIDE 0.9% IV SOLUTION
250.0000 mL | Freq: Once | INTRAVENOUS | Status: AC
  Administered 2019-05-07: 250 mL via INTRAVENOUS
  Filled 2019-05-07: qty 250

## 2019-05-07 MED ORDER — METHYLPREDNISOLONE SODIUM SUCC 125 MG IJ SOLR
60.0000 mg | Freq: Once | INTRAMUSCULAR | Status: AC
  Administered 2019-05-07: 60 mg via INTRAVENOUS

## 2019-05-07 MED ORDER — ACETAMINOPHEN 325 MG PO TABS
650.0000 mg | ORAL_TABLET | Freq: Once | ORAL | Status: AC
  Administered 2019-05-07: 650 mg via ORAL

## 2019-05-07 MED ORDER — ACETAMINOPHEN 325 MG PO TABS
ORAL_TABLET | ORAL | Status: AC
  Filled 2019-05-07: qty 2

## 2019-05-07 MED ORDER — METHYLPREDNISOLONE SODIUM SUCC 125 MG IJ SOLR
INTRAMUSCULAR | Status: AC
  Filled 2019-05-07: qty 2

## 2019-05-07 NOTE — Patient Instructions (Signed)

## 2019-05-08 ENCOUNTER — Ambulatory Visit (INDEPENDENT_AMBULATORY_CARE_PROVIDER_SITE_OTHER): Payer: Medicare Other | Admitting: Podiatry

## 2019-05-08 ENCOUNTER — Encounter: Payer: Self-pay | Admitting: Podiatry

## 2019-05-08 VITALS — BP 127/61 | HR 65

## 2019-05-08 DIAGNOSIS — M19079 Primary osteoarthritis, unspecified ankle and foot: Secondary | ICD-10-CM

## 2019-05-08 DIAGNOSIS — B351 Tinea unguium: Secondary | ICD-10-CM

## 2019-05-08 DIAGNOSIS — M79674 Pain in right toe(s): Secondary | ICD-10-CM

## 2019-05-08 DIAGNOSIS — M79675 Pain in left toe(s): Secondary | ICD-10-CM

## 2019-05-08 LAB — PREPARE PLATELET PHERESIS
Unit division: 0
Unit division: 0

## 2019-05-08 LAB — BPAM PLATELET PHERESIS
Blood Product Expiration Date: 202103312359
Blood Product Expiration Date: 202104012359
ISSUE DATE / TIME: 202103301311
ISSUE DATE / TIME: 202103301421
Unit Type and Rh: 6200
Unit Type and Rh: 6200

## 2019-05-08 NOTE — Progress Notes (Signed)
Subjective: Shaun Hobbs presents today referred by Chesley Noon, MD with cc of painful, discolored, thick toenails which interfere with daily activities.  Nails have become difficult to trim and they have sought professional treatment. Pain is aggravated when wearing enclosed shoe gear.   Patient's wife accompanies him on today's visit. He has h/o dementia. Wife relates most of his history.   Past Medical History:  Diagnosis Date  . Anemia   . Cancer ([place]) 2008   Myelodysplastic syndrome  . Dementia ([place])   . Hypothyroidism      Patient Active Problem List   Diagnosis Date Noted  . Benign essential hypertension 02/26/2019  . Graves disease 02/26/2019  . History of lumbar laminectomy 02/26/2019  . Hypercholesterolemia 02/26/2019  . Lumbar back pain 02/26/2019  . Thrombocytopenia ([place]) 02/26/2019  . Pneumonia due to COVID-19 virus 11/25/2018  . Myelodysplasia (myelodysplastic syndrome) ([place]) 11/25/2018  . Dementia without behavioral disturbance ([place]) 11/25/2018  . Anemia with low platelet count ([place]) 11/14/2018  . Benign prostatic hyperplasia 11/14/2018  . Memory loss 11/14/2018  . Advance care planning 01/12/2018  . Chemotherapy induced diarrhea 05/09/2017  . Early onset Alzheimer's dementia without behavioral disturbance ([place]) 05/09/2017  . IFG (impaired fasting glucose) 05/09/2017  . Bilateral impacted cerumen 04/15/2016  . Dermatitis 02/23/2016  . Bilateral carotid bruits 12/09/2015  . DNR (do not resuscitate) 12/09/2015  . Other microscopic hematuria 12/09/2015  . Hypothyroidism 12/08/2015  . Vitamin D deficiency 12/08/2015     History reviewed. No pertinent surgical history.   Current Outpatient Medications on File Prior to Visit  Medication Sig Dispense Refill  . Ascorbic Acid 120 MG CHEW Chew by mouth.    Marland Kitchen atorvastatin (LIPITOR) 10 MG tablet Take 5 mg by mouth daily.    . Cholecalciferol (VITAMIN D3 PO) Take 1,000 Units by mouth daily.     Marland Kitchen donepezil  (ARICEPT) 10 MG tablet Take 10 mg by mouth at bedtime.    Marland Kitchen levothyroxine (SYNTHROID, LEVOTHROID) 100 MCG tablet Take 100 mcg by mouth daily before breakfast.    . MAGNESIUM PO Take 400 mg by mouth daily.     . mupirocin ointment (BACTROBAN) 2 % Apply 1 application topically 2 (two) times daily. To area of induration on forearm 22 g 2  . sertraline (ZOLOFT) 100 MG tablet Take 100 mg by mouth at bedtime.    . tamsulosin (FLOMAX) 0.4 MG CAPS capsule Take 0.4 mg by mouth at bedtime.    . tranexamic acid (LYSTEDA) 650 MG TABS tablet Take 1 tablet (650 mg total) by mouth 2 (two) times daily. 60 tablet 3   No current facility-administered medications on file prior to visit.     Allergies  Allergen Reactions  . Sulfa Antibiotics Other (See Comments)    Patient reports: reaction in past  Patient reports: reaction in past     Social History   Occupational History  . Not on file  Tobacco Use  . Smoking status: Former Smoker    Quit date: 1981    Years since quitting: 40.2  . Smokeless tobacco: Never Used  Substance and Sexual Activity  . Alcohol use: Yes  . Drug use: Never  . Sexual activity: Not Currently   History reviewed. No pertinent family history.   Immunization History  Administered Date(s) Administered  . Influenza-Unspecified 12/19/2011, 12/08/2016, 12/08/2017  . Pneumococcal Conjugate-13 01/20/2017  . Pneumococcal Polysaccharide-23 02/10/2004  . Zoster 02/10/2004  . Zoster Recombinat (Shingrix) 09/08/2017, 01/23/2018     Review  of systems: Positive Findings in bold print.  Constitutional:  chills, fatigue, fever, sweats, weight change Communication: Optometrist, sign Ecologist, hand writing, iPad/Android device Head: headaches, head injury Eyes: changes in vision, eye pain, glaucoma, cataracts, macular degeneration, diplopia, glare,  light sensitivity, eyeglasses or contacts, blindness Ears nose mouth throat: hearing impaired, hearing aids,  ringing in ears,  deaf, sign language,  vertigo,   nosebleeds,  rhinitis,  cold sores, snoring, swollen glands Cardiovascular: HTN, edema, arrhythmia, pacemaker in place, defibrillator in place, chest pain/tightness, chronic anticoagulation, blood clot, heart failure, MI Peripheral Vascular: leg cramps, varicose veins, blood clots, lymphedema, varicosities Respiratory:  difficulty breathing, denies congestion, SOB, wheezing, cough, emphysema Gastrointestinal: change in appetite or weight, abdominal pain, constipation, diarrhea, nausea, vomiting, vomiting blood, change in bowel habits, abdominal pain, jaundice, rectal bleeding, hemorrhoids, GERD Genitourinary:  nocturia,  pain on urination, polyuria,  blood in urine, Foley catheter, urinary urgency, ESRD on hemodialysis Musculoskeletal: amputation, cramping, stiff joints, painful joints, decreased joint motion, fractures, OA, gout, hemiplegia, paraplegia, uses cane, wheelchair bound, uses walker, uses rollator Skin: +changes in toenails, color change, dryness, itching, mole changes,  rash, wound(s) Neurological: headaches, numbness in feet, paresthesias in feet, burning in feet, fainting,  seizures, change in speech. denies headaches, memory problems/poor historian, cerebral palsy, weakness, paralysis, CVA, TIA Endocrine: diabetes, hypothyroidism, hyperthyroidism,  goiter, dry mouth, flushing, heat intolerance,  cold intolerance,  excessive thirst, denies polyuria,  nocturia Hematological:  easy bleeding, excessive bleeding, easy bruising, enlarged lymph nodes, on long term blood thinner, history of past transusions Allergy/immunological:  hives, eczema, frequent infections, multiple drug allergies, seasonal allergies, transplant recipient, multiple food allergies Psychiatric:  anxiety, depression, mood disorder, suicidal ideations, hallucinations, insomnia  Objective: Vitals:   05/08/19 1019  BP: 127/61  Pulse: 64    86 y.o. Caucasian male WD, WN IN NAD. AAO X  3.  Vascular Examination: Capillary refill time to digits immediate b/l. Palpable DP pulses b/l. Palpable PT pulses b/l. Pedal hair sparse b/l. Skin temperature gradient within normal limits b/l.  Dermatological Examination: Pedal skin with normal turgor, texture and tone bilaterally. No open wounds bilaterally. No interdigital macerations bilaterally. Toenails 1-5 b/l elongated, dystrophic, thickened, crumbly with subungual debris and tenderness to dorsal palpation. Evidence of old subungual hematoma left hallux. Nailplate remains adhered. No surrounding erythema, no edema, no drainage.  Musculoskeletal Examination: Normal muscle strength 5/5 to all lower extremity muscle groups bilaterally, no pain crepitus or joint limitation noted with ROM b/l, limited joint ROM to the 1st met cuneiform joint with dorsal exostosis b/l. and patient ambulates independent of any assistive aids  Neurological Examination: Protective sensation intact 5/5 intact bilaterally with 10g monofilament b/l Vibratory sensation intact b/l Proprioception intact bilaterally  Assessment: 1. Pain due to onychomycosis of toenails of both feet   2. Osteoarthritis of ankle and foot, unspecified laterality    Plan: -Discussed topical, laser and oral medication. Patient's wife would like think about it and revisit on next visit. -Toenails 1-5 b/l were debrided in length and girth with sterile nail nippers and dremel without iatrogenic bleeding.  -Patient to continue soft, supportive shoe gear daily. -Patient to report any pedal injuries to medical professional immediately. -Patient/POA to call should there be question/concern in the interim.  Return in about 3 months (around 08/07/2019) for nail trim.

## 2019-05-08 NOTE — Patient Instructions (Signed)

## 2019-05-09 ENCOUNTER — Telehealth: Payer: Self-pay | Admitting: Hematology

## 2019-05-09 NOTE — Telephone Encounter (Signed)
Scheduled per 03/30 los, called patient's wife and patient will be notified of upcoming appointments.

## 2019-05-11 LAB — TYPE AND SCREEN
ABO/RH(D): A POS
Antibody Screen: NEGATIVE
Unit division: 0

## 2019-05-11 LAB — BPAM RBC
Blood Product Expiration Date: 202104172359
Unit Type and Rh: 6200

## 2019-05-21 ENCOUNTER — Inpatient Hospital Stay: Payer: Medicare Other | Attending: Hematology

## 2019-05-21 ENCOUNTER — Inpatient Hospital Stay: Payer: Medicare Other

## 2019-05-21 ENCOUNTER — Other Ambulatory Visit: Payer: Self-pay | Admitting: *Deleted

## 2019-05-21 ENCOUNTER — Telehealth: Payer: Self-pay

## 2019-05-21 ENCOUNTER — Other Ambulatory Visit: Payer: Self-pay

## 2019-05-21 DIAGNOSIS — D696 Thrombocytopenia, unspecified: Secondary | ICD-10-CM

## 2019-05-21 DIAGNOSIS — D649 Anemia, unspecified: Secondary | ICD-10-CM

## 2019-05-21 DIAGNOSIS — D469 Myelodysplastic syndrome, unspecified: Secondary | ICD-10-CM

## 2019-05-21 LAB — CBC WITH DIFFERENTIAL/PLATELET
Abs Immature Granulocytes: 0.03 10*3/uL (ref 0.00–0.07)
Basophils Absolute: 0 10*3/uL (ref 0.0–0.1)
Basophils Relative: 0 %
Eosinophils Absolute: 0 10*3/uL (ref 0.0–0.5)
Eosinophils Relative: 0 %
HCT: 27.2 % — ABNORMAL LOW (ref 39.0–52.0)
Hemoglobin: 8.4 g/dL — ABNORMAL LOW (ref 13.0–17.0)
Immature Granulocytes: 1 %
Lymphocytes Relative: 36 %
Lymphs Abs: 0.8 10*3/uL (ref 0.7–4.0)
MCH: 30.5 pg (ref 26.0–34.0)
MCHC: 30.9 g/dL (ref 30.0–36.0)
MCV: 98.9 fL (ref 80.0–100.0)
Monocytes Absolute: 0.2 10*3/uL (ref 0.1–1.0)
Monocytes Relative: 7 %
Neutro Abs: 1.2 10*3/uL — ABNORMAL LOW (ref 1.7–7.7)
Neutrophils Relative %: 56 %
Platelets: 5 10*3/uL — CL (ref 150–400)
RBC: 2.75 MIL/uL — ABNORMAL LOW (ref 4.22–5.81)
RDW: 22.5 % — ABNORMAL HIGH (ref 11.5–15.5)
WBC: 2.2 10*3/uL — ABNORMAL LOW (ref 4.0–10.5)
nRBC: 0 % (ref 0.0–0.2)

## 2019-05-21 MED ORDER — SODIUM CHLORIDE 0.9% IV SOLUTION
250.0000 mL | Freq: Once | INTRAVENOUS | Status: AC
  Administered 2019-05-21: 12:00:00 250 mL via INTRAVENOUS
  Filled 2019-05-21: qty 250

## 2019-05-21 MED ORDER — METHYLPREDNISOLONE SODIUM SUCC 125 MG IJ SOLR
60.0000 mg | Freq: Once | INTRAMUSCULAR | Status: AC
  Administered 2019-05-21: 12:00:00 60 mg via INTRAVENOUS

## 2019-05-21 MED ORDER — METHYLPREDNISOLONE SODIUM SUCC 125 MG IJ SOLR
INTRAMUSCULAR | Status: AC
  Filled 2019-05-21: qty 2

## 2019-05-21 MED ORDER — ACETAMINOPHEN 325 MG PO TABS
ORAL_TABLET | ORAL | Status: AC
  Filled 2019-05-21: qty 2

## 2019-05-21 MED ORDER — ACETAMINOPHEN 325 MG PO TABS
650.0000 mg | ORAL_TABLET | Freq: Once | ORAL | Status: AC
  Administered 2019-05-21: 650 mg via ORAL

## 2019-05-21 NOTE — Patient Instructions (Signed)

## 2019-05-21 NOTE — Telephone Encounter (Signed)
Critical Value: Platelet count 5,000 Inocencio Homes RN Notified

## 2019-05-22 LAB — PREPARE PLATELET PHERESIS
Unit division: 0
Unit division: 0

## 2019-05-22 LAB — BPAM PLATELET PHERESIS
Blood Product Expiration Date: 202104152359
Blood Product Expiration Date: 202104152359
ISSUE DATE / TIME: 202104131159
ISSUE DATE / TIME: 202104131302
Unit Type and Rh: 6200
Unit Type and Rh: 6200

## 2019-06-04 ENCOUNTER — Other Ambulatory Visit: Payer: Self-pay | Admitting: *Deleted

## 2019-06-04 ENCOUNTER — Other Ambulatory Visit: Payer: Self-pay

## 2019-06-04 ENCOUNTER — Inpatient Hospital Stay: Payer: Medicare Other

## 2019-06-04 ENCOUNTER — Other Ambulatory Visit: Payer: Self-pay | Admitting: Emergency Medicine

## 2019-06-04 DIAGNOSIS — D469 Myelodysplastic syndrome, unspecified: Secondary | ICD-10-CM

## 2019-06-04 DIAGNOSIS — D649 Anemia, unspecified: Secondary | ICD-10-CM

## 2019-06-04 DIAGNOSIS — D696 Thrombocytopenia, unspecified: Secondary | ICD-10-CM

## 2019-06-04 LAB — CBC WITH DIFFERENTIAL/PLATELET
Abs Immature Granulocytes: 0 10*3/uL (ref 0.00–0.07)
Basophils Absolute: 0 10*3/uL (ref 0.0–0.1)
Basophils Relative: 0 %
Eosinophils Absolute: 0 10*3/uL (ref 0.0–0.5)
Eosinophils Relative: 1 %
HCT: 27.4 % — ABNORMAL LOW (ref 39.0–52.0)
Hemoglobin: 8.4 g/dL — ABNORMAL LOW (ref 13.0–17.0)
Immature Granulocytes: 0 %
Lymphocytes Relative: 36 %
Lymphs Abs: 0.8 10*3/uL (ref 0.7–4.0)
MCH: 30.7 pg (ref 26.0–34.0)
MCHC: 30.7 g/dL (ref 30.0–36.0)
MCV: 100 fL (ref 80.0–100.0)
Monocytes Absolute: 0.2 10*3/uL (ref 0.1–1.0)
Monocytes Relative: 7 %
Neutro Abs: 1.2 10*3/uL — ABNORMAL LOW (ref 1.7–7.7)
Neutrophils Relative %: 56 %
Platelets: 5 10*3/uL — CL (ref 150–400)
RBC: 2.74 MIL/uL — ABNORMAL LOW (ref 4.22–5.81)
RDW: 22 % — ABNORMAL HIGH (ref 11.5–15.5)
WBC: 2.2 10*3/uL — ABNORMAL LOW (ref 4.0–10.5)
nRBC: 0 % (ref 0.0–0.2)

## 2019-06-04 LAB — TYPE AND SCREEN
ABO/RH(D): A POS
Antibody Screen: NEGATIVE

## 2019-06-04 MED ORDER — ACETAMINOPHEN 325 MG PO TABS
ORAL_TABLET | ORAL | Status: AC
  Filled 2019-06-04: qty 2

## 2019-06-04 MED ORDER — SODIUM CHLORIDE 0.9% IV SOLUTION
250.0000 mL | Freq: Once | INTRAVENOUS | Status: AC
  Administered 2019-06-04: 250 mL via INTRAVENOUS
  Filled 2019-06-04: qty 250

## 2019-06-04 MED ORDER — ACETAMINOPHEN 325 MG PO TABS
650.0000 mg | ORAL_TABLET | Freq: Once | ORAL | Status: AC
  Administered 2019-06-04: 650 mg via ORAL

## 2019-06-04 NOTE — Progress Notes (Signed)
1110: [facility] notified this writer that hgb 8.4 and plt 5. Dr. Irene Limbo informed. Verbal Orders received for 2 units platelets

## 2019-06-04 NOTE — Patient Instructions (Signed)

## 2019-06-05 LAB — PREPARE PLATELET PHERESIS
Unit division: 0
Unit division: 0

## 2019-06-05 LAB — BPAM PLATELET PHERESIS
Blood Product Expiration Date: 202104292359
Blood Product Expiration Date: 202104292359
ISSUE DATE / TIME: 202104271207
ISSUE DATE / TIME: 202104271307
Unit Type and Rh: 6200
Unit Type and Rh: 6200

## 2019-06-18 ENCOUNTER — Other Ambulatory Visit: Payer: Self-pay | Admitting: *Deleted

## 2019-06-18 ENCOUNTER — Inpatient Hospital Stay: Payer: Medicare Other

## 2019-06-18 ENCOUNTER — Telehealth: Payer: Self-pay | Admitting: *Deleted

## 2019-06-18 ENCOUNTER — Other Ambulatory Visit: Payer: Self-pay

## 2019-06-18 ENCOUNTER — Inpatient Hospital Stay: Payer: Medicare Other | Attending: Hematology

## 2019-06-18 DIAGNOSIS — D469 Myelodysplastic syndrome, unspecified: Secondary | ICD-10-CM | POA: Diagnosis not present

## 2019-06-18 DIAGNOSIS — D696 Thrombocytopenia, unspecified: Secondary | ICD-10-CM

## 2019-06-18 LAB — CBC WITH DIFFERENTIAL/PLATELET
Abs Immature Granulocytes: 0.03 10*3/uL (ref 0.00–0.07)
Basophils Absolute: 0 10*3/uL (ref 0.0–0.1)
Basophils Relative: 1 %
Eosinophils Absolute: 0 10*3/uL (ref 0.0–0.5)
Eosinophils Relative: 1 %
HCT: 30.6 % — ABNORMAL LOW (ref 39.0–52.0)
Hemoglobin: 9.3 g/dL — ABNORMAL LOW (ref 13.0–17.0)
Immature Granulocytes: 2 %
Lymphocytes Relative: 33 %
Lymphs Abs: 0.7 10*3/uL (ref 0.7–4.0)
MCH: 31.1 pg (ref 26.0–34.0)
MCHC: 30.4 g/dL (ref 30.0–36.0)
MCV: 102.3 fL — ABNORMAL HIGH (ref 80.0–100.0)
Monocytes Absolute: 0.2 10*3/uL (ref 0.1–1.0)
Monocytes Relative: 8 %
Neutro Abs: 1.2 10*3/uL — ABNORMAL LOW (ref 1.7–7.7)
Neutrophils Relative %: 55 %
Platelets: 5 10*3/uL — CL (ref 150–400)
RBC: 2.99 MIL/uL — ABNORMAL LOW (ref 4.22–5.81)
RDW: 21.2 % — ABNORMAL HIGH (ref 11.5–15.5)
WBC: 2 10*3/uL — ABNORMAL LOW (ref 4.0–10.5)
nRBC: 0 % (ref 0.0–0.2)

## 2019-06-18 LAB — TYPE AND SCREEN
ABO/RH(D): A POS
Antibody Screen: NEGATIVE

## 2019-06-18 MED ORDER — SODIUM CHLORIDE 0.9% IV SOLUTION
250.0000 mL | Freq: Once | INTRAVENOUS | Status: AC
  Administered 2019-06-18: 250 mL via INTRAVENOUS
  Filled 2019-06-18: qty 250

## 2019-06-18 MED ORDER — ACETAMINOPHEN 325 MG PO TABS
650.0000 mg | ORAL_TABLET | Freq: Once | ORAL | Status: AC
  Administered 2019-06-18: 650 mg via ORAL

## 2019-06-18 MED ORDER — ACETAMINOPHEN 325 MG PO TABS
ORAL_TABLET | ORAL | Status: AC
  Filled 2019-06-18: qty 2

## 2019-06-18 NOTE — Telephone Encounter (Signed)
10:38 am - notified by Jen/lab. Platelets 5. Dr.Kale informed. Orders  recieved

## 2019-06-18 NOTE — Patient Instructions (Signed)

## 2019-06-19 LAB — PREPARE PLATELET PHERESIS
Unit division: 0
Unit division: 0

## 2019-06-19 LAB — BPAM PLATELET PHERESIS
Blood Product Expiration Date: 202105132359
Blood Product Expiration Date: 202105132359
ISSUE DATE / TIME: 202105111149
ISSUE DATE / TIME: 202105111311
Unit Type and Rh: 6200
Unit Type and Rh: 6200

## 2019-07-02 ENCOUNTER — Other Ambulatory Visit: Payer: Self-pay | Admitting: *Deleted

## 2019-07-02 ENCOUNTER — Other Ambulatory Visit: Payer: Self-pay

## 2019-07-02 ENCOUNTER — Inpatient Hospital Stay: Payer: Medicare Other

## 2019-07-02 DIAGNOSIS — D469 Myelodysplastic syndrome, unspecified: Secondary | ICD-10-CM

## 2019-07-02 DIAGNOSIS — D649 Anemia, unspecified: Secondary | ICD-10-CM

## 2019-07-02 DIAGNOSIS — D696 Thrombocytopenia, unspecified: Secondary | ICD-10-CM

## 2019-07-02 LAB — CBC WITH DIFFERENTIAL/PLATELET
Abs Immature Granulocytes: 0.03 10*3/uL (ref 0.00–0.07)
Basophils Absolute: 0 10*3/uL (ref 0.0–0.1)
Basophils Relative: 0 %
Eosinophils Absolute: 0 10*3/uL (ref 0.0–0.5)
Eosinophils Relative: 1 %
HCT: 29.4 % — ABNORMAL LOW (ref 39.0–52.0)
Hemoglobin: 8.8 g/dL — ABNORMAL LOW (ref 13.0–17.0)
Immature Granulocytes: 1 %
Lymphocytes Relative: 32 %
Lymphs Abs: 0.7 10*3/uL (ref 0.7–4.0)
MCH: 30.7 pg (ref 26.0–34.0)
MCHC: 29.9 g/dL — ABNORMAL LOW (ref 30.0–36.0)
MCV: 102.4 fL — ABNORMAL HIGH (ref 80.0–100.0)
Monocytes Absolute: 0.2 10*3/uL (ref 0.1–1.0)
Monocytes Relative: 8 %
Neutro Abs: 1.3 10*3/uL — ABNORMAL LOW (ref 1.7–7.7)
Neutrophils Relative %: 58 %
Platelets: <5 10*3/uL — CL (ref 150–400)
RBC: 2.87 MIL/uL — ABNORMAL LOW (ref 4.22–5.81)
RDW: 19.9 % — ABNORMAL HIGH (ref 11.5–15.5)
WBC: 2.3 10*3/uL — ABNORMAL LOW (ref 4.0–10.5)
nRBC: 0 % (ref 0.0–0.2)

## 2019-07-02 LAB — TYPE AND SCREEN
ABO/RH(D): A POS
Antibody Screen: NEGATIVE

## 2019-07-02 MED ORDER — SODIUM CHLORIDE 0.9% IV SOLUTION
250.0000 mL | Freq: Once | INTRAVENOUS | Status: AC
  Administered 2019-07-02: 250 mL via INTRAVENOUS
  Filled 2019-07-02: qty 250

## 2019-07-02 MED ORDER — ACETAMINOPHEN 325 MG PO TABS
650.0000 mg | ORAL_TABLET | Freq: Once | ORAL | Status: AC
  Administered 2019-07-02: 650 mg via ORAL

## 2019-07-02 MED ORDER — ACETAMINOPHEN 325 MG PO TABS
ORAL_TABLET | ORAL | Status: AC
  Filled 2019-07-02: qty 2

## 2019-07-03 LAB — BPAM PLATELET PHERESIS
Blood Product Expiration Date: 202105262359
Blood Product Expiration Date: 202105262359
ISSUE DATE / TIME: 202105251158
ISSUE DATE / TIME: 202105251227
Unit Type and Rh: 5100
Unit Type and Rh: 7300

## 2019-07-03 LAB — PREPARE PLATELET PHERESIS
Unit division: 0
Unit division: 0

## 2019-07-09 ENCOUNTER — Telehealth: Payer: Self-pay

## 2019-07-09 NOTE — Telephone Encounter (Signed)
TCT patient regarding request to reschedule Lab & Blood/Platelet transfusion of 08/13/19, unable to reach a patient.  LVM per Dr. Irene Limbo: they could reschedule a few days before or after -- would not completely skip the appointment and go to 7/20 since he needs platelet transfusions q2weeks.  Scheduling message sent.

## 2019-07-15 ENCOUNTER — Other Ambulatory Visit: Payer: Self-pay

## 2019-07-15 DIAGNOSIS — D649 Anemia, unspecified: Secondary | ICD-10-CM

## 2019-07-16 ENCOUNTER — Inpatient Hospital Stay: Payer: Medicare Other

## 2019-07-16 ENCOUNTER — Other Ambulatory Visit: Payer: Self-pay

## 2019-07-16 ENCOUNTER — Inpatient Hospital Stay: Payer: Medicare Other | Attending: Hematology

## 2019-07-16 DIAGNOSIS — D469 Myelodysplastic syndrome, unspecified: Secondary | ICD-10-CM | POA: Diagnosis not present

## 2019-07-16 DIAGNOSIS — D649 Anemia, unspecified: Secondary | ICD-10-CM

## 2019-07-16 DIAGNOSIS — D696 Thrombocytopenia, unspecified: Secondary | ICD-10-CM

## 2019-07-16 LAB — CBC WITH DIFFERENTIAL/PLATELET
Abs Immature Granulocytes: 0 10*3/uL (ref 0.00–0.07)
Basophils Absolute: 0 10*3/uL (ref 0.0–0.1)
Basophils Relative: 0 %
Eosinophils Absolute: 0 10*3/uL (ref 0.0–0.5)
Eosinophils Relative: 2 %
HCT: 30.5 % — ABNORMAL LOW (ref 39.0–52.0)
Hemoglobin: 9.2 g/dL — ABNORMAL LOW (ref 13.0–17.0)
Immature Granulocytes: 0 %
Lymphocytes Relative: 31 %
Lymphs Abs: 0.8 10*3/uL (ref 0.7–4.0)
MCH: 30.9 pg (ref 26.0–34.0)
MCHC: 30.2 g/dL (ref 30.0–36.0)
MCV: 102.3 fL — ABNORMAL HIGH (ref 80.0–100.0)
Monocytes Absolute: 0.1 10*3/uL (ref 0.1–1.0)
Monocytes Relative: 5 %
Neutro Abs: 1.6 10*3/uL — ABNORMAL LOW (ref 1.7–7.7)
Neutrophils Relative %: 62 %
Platelets: <5 10*3/uL — CL (ref 150–400)
RBC: 2.98 MIL/uL — ABNORMAL LOW (ref 4.22–5.81)
RDW: 19 % — ABNORMAL HIGH (ref 11.5–15.5)
WBC: 2.6 10*3/uL — ABNORMAL LOW (ref 4.0–10.5)
nRBC: 0 % (ref 0.0–0.2)

## 2019-07-16 LAB — PREPARE RBC (CROSSMATCH)

## 2019-07-16 MED ORDER — SODIUM CHLORIDE 0.9% IV SOLUTION
250.0000 mL | Freq: Once | INTRAVENOUS | Status: AC
  Administered 2019-07-16: 250 mL via INTRAVENOUS
  Filled 2019-07-16: qty 250

## 2019-07-16 MED ORDER — ACETAMINOPHEN 325 MG PO TABS
ORAL_TABLET | ORAL | Status: AC
  Filled 2019-07-16: qty 2

## 2019-07-16 MED ORDER — METHYLPREDNISOLONE SODIUM SUCC 40 MG IJ SOLR
INTRAMUSCULAR | Status: AC
  Filled 2019-07-16: qty 1

## 2019-07-16 MED ORDER — METHYLPREDNISOLONE SODIUM SUCC 40 MG IJ SOLR
40.0000 mg | Freq: Once | INTRAMUSCULAR | Status: AC
  Administered 2019-07-16: 40 mg via INTRAVENOUS

## 2019-07-16 MED ORDER — ACETAMINOPHEN 325 MG PO TABS
650.0000 mg | ORAL_TABLET | Freq: Once | ORAL | Status: AC
  Administered 2019-07-16: 650 mg via ORAL

## 2019-07-16 NOTE — Patient Instructions (Addendum)
Thrombocytopenia Thrombocytopenia means that you have a low number of platelets in your blood. Platelets are tiny cells in the blood. When you bleed, they clump together at the cut or injury to stop the bleeding. This is called blood clotting. If you do not have enough platelets, it can cause bleeding problems. Some cases of this condition are mild while others are more severe. What are the causes? This condition may be caused by:  Your body not making enough platelets. This may be caused by: ? Your bone marrow not making blood cells (aplastic anemia). ? Cancer in the bone marrow. ? Certain medicines. ? Infection in the bone marrow. ? Drinking a lot of alcohol.  Your body destroying platelets too quickly. This may be caused by: ? Certain immune diseases. ? Certain medicines. ? Certain blood clotting disorders. ? Certain disorders that are passed from parent to child (inherited). ? Certain bleeding disorders. ? Pregnancy. ? Having a spleen that is larger than normal. What are the signs or symptoms?  Bleeding that is not normal.  Nosebleeds.  Heavy menstrual periods.  Blood in the pee (urine) or poop (stool).  A purple-like color to the skin (purpura).  Bruising.  A rash that looks like pinpoint, purple-red spots (petechiae). How is this treated?  Treatment of another condition that is causing the low platelet count.  Medicines to help protect your platelets from being destroyed.  A replacement (transfusion) of platelets to stop or prevent bleeding.  Surgery to remove the spleen. Follow these instructions at home: Activity  Avoid activities that could cause you to get hurt or bruised. Follow instructions about how to prevent falls.  Take care not to cut yourself: ? When you shave. ? When you use scissors, needles, knives, or other tools.  Take care not to burn yourself: ? When you use an iron. ? When you cook. General instructions   Check your skin and the  inside of your mouth for bruises or blood as told by your doctor.  Check to see if there is blood in your spit (sputum), pee, and poop. Do this as told by your doctor.  Do not drink alcohol.  Take over-the-counter and prescription medicines only as told by your doctor.  Do not take any medicines that have aspirin or NSAIDs in them. These medicines can thin your blood and cause you to bleed.  Tell all of your doctors that you have this condition. Be sure to tell your dentist and eye doctor too. Contact a doctor if:  You have bruises and you do not know why. Get help right away if:  You are bleeding anywhere on your body.  You have blood in your spit, pee, or poop. Summary  Thrombocytopenia means that you have a low number of platelets in your blood.  Platelets are needed for blood clotting.  Symptoms of this condition include bleeding that is not normal, and bruising.  Take care not to cut or burn yourself. This information is not intended to replace advice given to you by your health care provider. Make sure you discuss any questions you have with your health care provider. Document Revised: 10/26/2017 Document Reviewed: 10/26/2017 Elsevier Patient Education  2020 Elsevier Inc.  Platelet Transfusion A platelet transfusion is a procedure in which you receive donated platelets through an IV. Platelets are tiny pieces of blood cells. When you get an injury, platelets clump together in the area to form a blood clot. This helps stop bleeding and is the beginning   of the healing process. If you have too few platelets, your blood may have trouble clotting. This may cause you to bleed and bruise very easily. You may need a platelet transfusion if you have a condition that causes a low number of platelets (thrombocytopenia). A platelet transfusion may be used to stop or prevent excessive bleeding. Tell a health care provider about:  Any reactions you have had during previous  transfusions.  Any allergies you have.  All medicines you are taking, including vitamins, herbs, eye drops, creams, and over-the-counter medicines.  Any blood disorders you have.  Any surgeries you have had.  Any medical conditions you have.  Whether you are pregnant or may be pregnant. What are the risks? Generally, this is a safe procedure. However, problems may occur, including:  Fever.  Infection.  Allergic reaction to the donor platelets.  Your body's disease-fighting system (immune system) attacking the donor platelets (hemolytic reaction). This is rare.  A rare reaction that causes lung damage (transfusion-related acute lung injury). What happens before the procedure? Medicines  Ask your health care provider about: ? Changing or stopping your regular medicines. This is especially important if you are taking diabetes medicines or blood thinners. ? Taking medicines such as aspirin and ibuprofen. These medicines can thin your blood. Do not take these medicines unless your health care provider tells you to take them. ? Taking over-the-counter medicines, vitamins, herbs, and supplements. General instructions  You will have a blood test to determine your blood type. Your blood type determines what kind of platelets you will be given.  Follow instructions from your health care provider about eating or drinking restrictions.  If you have had an allergic reaction to a transfusion in the past, you may be given medicine to help prevent a reaction.  Your temperature, blood pressure, pulse, and breathing will be monitored. What happens during the procedure?   An IV will be inserted into one of your veins.  For your safety, two health care providers will verify your identity along with the donor platelets about to be infused.  A bag of donor platelets will be connected to your IV. The platelets will flow into your bloodstream. This usually takes 30-60 minutes.  Your  temperature, blood pressure, pulse, and breathing will be monitored during the transfusion. This helps detect early signs of any reaction.  You will also be monitored for other symptoms that may indicate a reaction, including chills, hives, or itching.  If you have signs of a reaction at any time, your transfusion will be stopped, and you may be given medicine to help manage the reaction.  When your transfusion is complete, your IV will be removed.  Pressure may be applied to the IV site for a few minutes to stop any bleeding.  The IV site will be covered with a bandage (dressing). The procedure may vary among health care providers and hospitals. What happens after the procedure?  Your blood pressure, temperature, pulse, and breathing will be monitored until you leave the hospital or clinic.  You may have some bruising and soreness at your IV site. Follow these instructions at home: Medicines  Take over-the-counter and prescription medicines only as told by your health care provider.  Talk with your health care provider before you take any medicines that contain aspirin or NSAIDs. These medicines increase your risk for dangerous bleeding. General instructions  Change or remove your dressing as told by your health care provider.  Return to your normal activities as   told by your health care provider. Ask your health care provider what activities are safe for you.  Do not take baths, swim, or use a hot tub until your health care provider approves. Ask your health care provider if you may take showers.  Check your IV site every day for signs of infection. Check for: ? Redness, swelling, or pain. ? Fluid or blood. If fluid or blood drains from your IV site, use your hands to press down firmly on a bandage covering the area for a minute or two. Doing this should stop the bleeding. ? Warmth. ? Pus or a bad smell.  Keep all follow-up visits as told by your health care provider. This is  important. Contact a health care provider if you have:  A headache that does not go away with medicine.  Hives, rash, or itchy skin.  Nausea or vomiting.  Unusual tiredness or weakness.  Signs of infection at your IV site. Get help right away if:  You have a fever or chills.  You urinate less often than usual.  Your urine is darker colored than normal.  You have any of the following: ? Trouble breathing. ? Pain in your back, abdomen, or chest. ? Cool, clammy skin. ? A fast heartbeat. Summary  Platelets are tiny pieces of blood cells that clump together to form a blood clot when you have an injury. If you have too few platelets, your blood may have trouble clotting.  A platelet transfusion is a procedure in which you receive donated platelets through an IV.  A platelet transfusion may be used to stop or prevent excessive bleeding.  After the procedure, check your IV site every day for signs of infection, including redness, swelling, pain, or warmth. This information is not intended to replace advice given to you by your health care provider. Make sure you discuss any questions you have with your health care provider. Document Revised: 03/01/2017 Document Reviewed: 03/01/2017 Elsevier Patient Education  2020 Elsevier Inc.  

## 2019-07-16 NOTE — Progress Notes (Signed)
Hemoglobin 9.2 per Dr. Irene Limbo no blood transfusion needed today. Pt. to receive platelets as ordered.

## 2019-07-17 LAB — PREPARE PLATELET PHERESIS
Unit division: 0
Unit division: 0

## 2019-07-17 LAB — BPAM RBC
Blood Product Expiration Date: 202106122359
Unit Type and Rh: 600

## 2019-07-17 LAB — TYPE AND SCREEN
ABO/RH(D): A POS
Antibody Screen: NEGATIVE
Unit division: 0

## 2019-07-17 LAB — BPAM PLATELET PHERESIS
Blood Product Expiration Date: 202106092359
Blood Product Expiration Date: 202106092359
ISSUE DATE / TIME: 202106081315
ISSUE DATE / TIME: 202106081424
Unit Type and Rh: 5100
Unit Type and Rh: 6200

## 2019-07-22 ENCOUNTER — Telehealth: Payer: Self-pay | Admitting: General Practice

## 2019-07-22 NOTE — Telephone Encounter (Signed)
    Went to chart to check who called pt. No notes on file

## 2019-07-23 NOTE — Telephone Encounter (Signed)
Patient's wife called again, wanting to know if we had called them.

## 2019-07-29 NOTE — Progress Notes (Signed)
HEMATOLOGY/ONCOLOGY CLINIC NOTE  Date of Service: 07/30/2019  Patient Care Team: Chesley Noon, MD as PCP - General (Family Medicine)  CHIEF COMPLAINTS/PURPOSE OF CONSULTATION:  Myelodysplastic Syndrome  Oncologic History:   Shaun Hobbs initially presented to care with pancytopenia and was evaluated with a BM Bx on 05/16/06 which did not reveal evidence of abnormal myeloid maturation or other abnormalities, and was without chromosomal abnormality as well. A BM Bx was repeated on 08/28/07 which did reveal dyserythropoiesis and some dysplasia. A 04/17/12 Bm biopsy revealed mild dysplasia, hypercellular marrow with 1.5% blasts, and a minute CD5 positive monoclonal B-cell population detected by flow. 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 His most recent 12/27/17 Bm Bx revealed 30% cellularity with trilineage hematopoiesis, mild megakaryocytic hyperplasia with dyspoietic changes, mild erythroid hyperplasia with ringed sideroblasts, and a minute population of monoclonal B-cells detected by flow.   The pt started 22m Revlimid for 3 months, beginning 09/03/10 "without noticeable improvement." He later began Vidaza, and completed his 21st cycle the week of November 30, 2017.  HISTORY OF PRESENTING ILLNESS:   Shaun MAHANYis a wonderful 86y.o. male who [place] [facility] [place] [facility] Syndrome. He is accompanied today by his wife and daughter. The pt reports that he is doing well overall.  The pt's wife reports that the first thing that occurred related to the patient's MDS was that his PLT were seen to be reduced to 99k, 12 years ago. The pt was evaluated with repeat BM biopsies over the last 12 years, and began Revlimid  for only 3 months in 2012. The pt's wife notes that he began Vidaza a little less than two years ago, after his PLT dropped to about 60k, and his last dose of Vidaza was in late October 2019. The pt has had one blood transfusion ever, which was in November 2019. The pt's wife notes that during this time of not taking Vidaza, she has not noticed a change in her husband's energy levels or day to day activities. The pt denies dizziness, light headedness, fatigue, abnormal bruising, nose bleeds, gum bleeds, or other concerns for bleeding. The pt and wife deny recent or frequent infections.  The pt's wife notes that Vidaza was stopped to "take a break," trend his counts, and [place] His last BM Bx was November 2019.  The pt and his wife note that there have not been other recent medical concerns. She notes that the pt's last A1C was 4.9.  The pt's wife notes that he is current with his annual flu vaccine, both every 5-year Prevnar and Pneumovax, and shingles vaccine.  The pt's wife notes that the pt's memory is "slowly decreasing," and he no longer drives. The pt's [facility]," and has taken Aricept for 20 years.  Most recent lab results (02/19/18) of CBC w/diff and CMP is as follows: all values are WNL except for WBC at 1.5k, RBC at 2.42, HGB at 7.9, HCT at 23.3, MCH at 32.2, PLT at 28k, RDW at 18.8, ANC at 700, Lymphocytes at 600, Monocytes at 200, Glucose at 111, BUN at 28.  On review of systems, pt  reports stable energy levels, eating well, stable weight, good appetite, and denies fevers, fatigue, light headedness, dizziness, nose bleeds, gum bleeds, abnormal bruising, other concerns for bleeding, falls, recent infections, frequent infections, bone pains, problems passing urine, abdominal pains, lower abdominal pains, and any other symptoms.  On PMHx the pt reports MDS, HLD, Early onset Alzheimer's  dementia, Hypothyroidism, hypertrophy or prostate with urinary obstruction.   Interval History:   Shaun Hobbs returns today for management and evaluation of his MDS. We are joined today by Mrs. Koppenhaver. The patient's last visit with [place] was on 05/07/2019. The pt reports that he is doing well overall.  The pt reports that he has been doing well, but does begin to bleed about 6 days prior to his PLT transfusions. He has not had any gum bleeds, nose bleeds, or bloody stools, but has has some hematuria.  He was recently started on Namenda for his dementia.  Lab results today (07/30/19) of CBC w/diff is as follows: all values are WNL except for WBC at 3.0K, RBC at 2.99, Hgb at 9.1, HCT at 29.4, .RDW at 18.6, PLT <5.   On review of systems, pt reports hematuria and denies gum bleeds, nose bleeds, bloody stools, low appetite, abdominal pain, constipation and any other symptoms.    MEDICAL HISTORY:  MDS, HLD, Early onset Alzheimer's dementia, Hypothyroidism, hypertrophy or prostate with urinary obstruction.   SURGICAL HISTORY: BM Bx   SOCIAL HISTORY: Social History   Socioeconomic History  . Marital status: Married    Spouse name: Not on file  . Number of children: Not on file  . Years of education: Not on file  . Highest education level: Not on file  Occupational History  . Not on file  Tobacco Use  . Smoking status: Former Smoker    Quit date: 1981    Years since quitting: 40.4  . Smokeless tobacco: Never Used  Vaping Use  . Vaping Use: Never used  Substance and Sexual Activity  . Alcohol use: Yes  . Drug use: Never  . Sexual activity: Not Currently  Other Topics Concern  . Not on file  Social History Narrative  . Not on file   Social Determinants of Health   Financial Resource Strain:   . Difficulty of Paying Living Expenses:   Food Insecurity:   . Worried About Charity fundraiser in the Last Year:   . Arboriculturist in the Last Year:   Transportation Needs:   .  Film/video editor (Medical):   Marland Kitchen Lack of Transportation (Non-Medical):   Physical Activity:   . Days of Exercise per Week:   . Minutes of Exercise per Session:   Stress:   . Feeling of Stress :   Social Connections:   . Frequency of Communication with Friends and Family:   . Frequency of Social Gatherings with Friends and Family:   . Attends Religious Services:   . Active Member of Clubs or Organizations:   . Attends Archivist Meetings:   Marland Kitchen Marital Status:   Intimate Partner Violence:   . Fear of Current or Ex-Partner:   . Emotionally Abused:   Marland Kitchen Physically Abused:   . Sexually Abused:     FAMILY HISTORY: No family history on file.  ALLERGIES:  is allergic to sulfa antibiotics.  MEDICATIONS:  Current Outpatient Medications  Medication Sig Dispense Refill  . atorvastatin (LIPITOR) 10 MG tablet Take 5 mg by mouth daily.    . Cholecalciferol (  VITAMIN D3 PO) Take 1,000 Units by mouth daily.     Marland Kitchen donepezil (ARICEPT) 10 MG tablet Take 10 mg by mouth at bedtime.    Marland Kitchen levothyroxine (SYNTHROID, LEVOTHROID) 100 MCG tablet Take 100 mcg by mouth daily before breakfast.    . MAGNESIUM PO Take 400 mg by mouth daily.     . memantine (NAMENDA) 10 MG tablet Take by mouth.    . mupirocin ointment (BACTROBAN) 2 % Apply 1 application topically 2 (two) times daily. To area of induration on forearm 22 g 2  . sertraline (ZOLOFT) 100 MG tablet Take 100 mg by mouth at bedtime.    . tamsulosin (FLOMAX) 0.4 MG CAPS capsule Take 0.4 mg by mouth at bedtime.    . tranexamic acid (LYSTEDA) 650 MG TABS tablet Take 1 tablet (650 mg total) by mouth 2 (two) times daily. 60 tablet 3   No current facility-administered medications for this visit.    REVIEW OF SYSTEMS:   A 10+ POINT REVIEW OF SYSTEMS WAS OBTAINED including neurology, dermatology, psychiatry, cardiac, respiratory, lymph, extremities, GI, GU, Musculoskeletal, constitutional, breasts, reproductive, HEENT.  All pertinent positives  are noted in the HPI.  All others are negative.   PHYSICAL EXAMINATION: ECOG PERFORMANCE STATUS: 2-3   . Vitals:   07/30/19 0946  BP: (!) 101/51  Pulse: 86  Resp: 18  Temp: (!) 97.5 F (36.4 C)  SpO2: 99%   Filed Weights   07/30/19 0946  Weight: 165 lb 1.6 oz (74.9 kg)   .Body mass index is 23.35 kg/m.  Exam was given today   GENERAL:alert, in no acute distress and comfortable SKIN: no acute rashes, no significant lesions EYES: conjunctiva are pink and non-injected, sclera anicteric OROPHARYNX: MMM, no exudates, no oropharyngeal erythema or ulceration NECK: supple, no JVD LYMPH:  no palpable lymphadenopathy in the cervical, axillary or inguinal regions LUNGS: clear to auscultation b/l with normal respiratory effort HEART: regular rate & rhythm ABDOMEN:  normoactive bowel sounds , non tender, not distended. No palpable hepatosplenomegaly.  Extremity: no pedal edema PSYCH: alert & oriented x 3 with fluent speech NEURO: no focal motor/sensory deficits  LABORATORY DATA:  I have reviewed the data as listed  . CBC Latest Ref Rng & Units 07/30/2019 07/16/2019 07/02/2019  WBC 4.0 - 10.5 K/uL 3.0(L) 2.6(L) 2.3(L)  Hemoglobin 13.0 - 17.0 g/dL 9.1(L) 9.2(L) 8.8(L)  Hematocrit 39 - 52 % 29.4(L) 30.5(L) 29.4(L)  Platelets 150 - 400 K/uL <5(LL) <5(LL) <5(LL)   ANC 500 . CMP Latest Ref Rng & Units 03/26/2019 02/26/2019 01/15/2019  Glucose 70 - 99 mg/dL 100(H) 130(H) 124(H)  BUN 8 - 23 mg/dL 25(H) 20 15  Creatinine 0.61 - 1.24 mg/dL 0.89 0.92 0.80  Sodium 135 - 145 mmol/L 140 139 138  Potassium 3.5 - 5.1 mmol/L 4.4 4.1 4.2  Chloride 98 - 111 mmol/L 109 108 105  CO2 22 - 32 mmol/L _0 Calcium 8.9 - 10.3 mg/dL 8.5(L) 8.2(L) 8.2(L)  Total Protein 6.5 - 8.1 g/dL 6.5 6.2(L) 6.0(L)  Total Bilirubin 0.3 - 1.2 mg/dL 0.4 0.5 0.4  Alkaline Phos 38 - 126 U/L 60 52 56  AST 15 - 41 U/L 14(L) 12(L) 14(L)  ALT 0 - 44 U/L 9 <6 8    CBC w/diff and CMP:    11/19/15 BM  Bx:   11/19/15 Cytogenetics:   12/27/17 Bm Bx:    RADIOGRAPHIC STUDIES: I have personally reviewed the radiological images as listed and agreed with the  findings in the report. No results found.  ASSESSMENT & PLAN:  86 y.o. male with:  1. 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 No evidence for a lymphoproliferative disorder. Normal cytogenetics. 08/28/07 BM Bx revealed mild dyserythropoiesis and a suggestion of some dysplasia within the myeloid series. 04/17/12 BM Bx revealed hypercellular marrow with 50% cellularity, 1.5% blasts, with trilineage hematopoiesis, marrow monocytosis, mild myeloid and megakaryocytic dysplasia, adequate storage and iron utilization, no ring sideroblasts. Small monoclonal B lymphocytic population of uncertain significance. 11/19/15 BM Bx revealed Hypercellular marrow for age about 70% with panmyelosis including increased megakaryocytes with atypia, moderate marrow monocytosis about 15-20% and <5% blasts. Minute CD5 positive monoclonal B-cell population. Storage iron present. 12/27/17 BM Bx revealed normocellular marrow for age with 30% cellularity and trilineage hematopoiesis, mild megakaryocytic hyperplasia with dyspoietic changes, mild erythroid hyperplasia with ringed sideroblasts, and minute population of monoclonal B cells detected by flow.  S/p 21 cycles of Vidaza completed in the week of November 30, 2017. Previous oncologic notes suggest that the patient's pancytopenia was continuing to worsen through [place] treatment.  Labs upon initial presentation from 02/19/18, WBC at 1.5k, HGB at 7.9, PLT at 28k, [place] at 700   PLAN: -Discussed pt labwork today, 07/30/19; Hgb & WBC have improved, PLT chronically low  -Discussed using weekly Nplate  injections to minimize PLT transfusion needs - pt will consider and we will discuss at next appt   -Advised pt that each subsequent PLT transfusion increases the amount of antibodies and refractory thrombocytopenia -No indication for PRBC transfusion today - Hgb at 9.1 -Will continue 1 unit of PRBC and 2 units of PLT every 2 weeks as needed  -Will continue Lysteda, as the risk of bleeding is greater than risk of clotting at this time -Will get an Immature Platelet Fraction today --27%-- elevated ? Element of Immune thrombocytopenia.Discusssed Nplate use -- will await decision. -Will see back in 3 months with labs     FOLLOW UP: Plz schedule 1 unit of PRBC and 2 units of Platelets q2weeks x 8 RTC with Dr Irene Limbo in 12 weeks   The total time spent in the appt was 20 minutes and more than 50% was on counseling and direct patient cares.  All of the patient's questions were answered with apparent satisfaction. The patient knows to call the clinic with any problems, questions or concerns.    Sullivan Lone MD [place] AAHIVMS [facility] Hematology/Oncology Physician [facility]  (Office):       [phone] (Work cell):  [phone] (Fax):           [phone]  07/30/2019 10:23 AM  I, Yevette Edwards, am acting as a scribe for Dr. Sullivan Lone.   .I have reviewed the above documentation for accuracy and completeness, and I agree with the above. Brunetta Genera MD

## 2019-07-30 ENCOUNTER — Inpatient Hospital Stay (HOSPITAL_BASED_OUTPATIENT_CLINIC_OR_DEPARTMENT_OTHER): Payer: Medicare Other | Admitting: Hematology

## 2019-07-30 ENCOUNTER — Inpatient Hospital Stay: Payer: Medicare Other

## 2019-07-30 ENCOUNTER — Other Ambulatory Visit: Payer: Self-pay

## 2019-07-30 ENCOUNTER — Other Ambulatory Visit: Payer: Self-pay | Admitting: *Deleted

## 2019-07-30 VITALS — BP 101/51 | HR 86 | Temp 97.5°F | Resp 18 | Ht 70.5 in | Wt 165.1 lb

## 2019-07-30 DIAGNOSIS — D696 Thrombocytopenia, unspecified: Secondary | ICD-10-CM

## 2019-07-30 DIAGNOSIS — D649 Anemia, unspecified: Secondary | ICD-10-CM

## 2019-07-30 DIAGNOSIS — D469 Myelodysplastic syndrome, unspecified: Secondary | ICD-10-CM

## 2019-07-30 LAB — CBC WITH DIFFERENTIAL/PLATELET
Abs Immature Granulocytes: 0 10*3/uL (ref 0.00–0.07)
Basophils Absolute: 0 10*3/uL (ref 0.0–0.1)
Basophils Relative: 0 %
Eosinophils Absolute: 0 10*3/uL (ref 0.0–0.5)
Eosinophils Relative: 0 %
HCT: 29.4 % — ABNORMAL LOW (ref 39.0–52.0)
Hemoglobin: 9.1 g/dL — ABNORMAL LOW (ref 13.0–17.0)
Immature Granulocytes: 0 %
Lymphocytes Relative: 27 %
Lymphs Abs: 0.8 10*3/uL (ref 0.7–4.0)
MCH: 30.4 pg (ref 26.0–34.0)
MCHC: 31 g/dL (ref 30.0–36.0)
MCV: 98.3 fL (ref 80.0–100.0)
Monocytes Absolute: 0.2 10*3/uL (ref 0.1–1.0)
Monocytes Relative: 5 %
Neutro Abs: 2 10*3/uL (ref 1.7–7.7)
Neutrophils Relative %: 68 %
Platelets: <5 10*3/uL — CL (ref 150–400)
RBC: 2.99 MIL/uL — ABNORMAL LOW (ref 4.22–5.81)
RDW: 18.6 % — ABNORMAL HIGH (ref 11.5–15.5)
WBC: 3 10*3/uL — ABNORMAL LOW (ref 4.0–10.5)
nRBC: 0 % (ref 0.0–0.2)

## 2019-07-30 LAB — IMMATURE PLATELET FRACTION: Immature Platelet Fraction: 27 % — ABNORMAL HIGH (ref 1.2–8.6)

## 2019-07-30 MED ORDER — ACETAMINOPHEN 325 MG PO TABS
ORAL_TABLET | ORAL | Status: AC
  Filled 2019-07-30: qty 2

## 2019-07-30 MED ORDER — ACETAMINOPHEN 325 MG PO TABS
650.0000 mg | ORAL_TABLET | Freq: Once | ORAL | Status: AC
  Administered 2019-07-30: 650 mg via ORAL

## 2019-07-30 MED ORDER — METHYLPREDNISOLONE SODIUM SUCC 40 MG IJ SOLR
INTRAMUSCULAR | Status: AC
  Filled 2019-07-30: qty 1

## 2019-07-30 MED ORDER — METHYLPREDNISOLONE SODIUM SUCC 40 MG IJ SOLR
40.0000 mg | Freq: Once | INTRAMUSCULAR | Status: AC
  Administered 2019-07-30: 40 mg via INTRAVENOUS

## 2019-07-30 MED ORDER — SODIUM CHLORIDE 0.9% IV SOLUTION
250.0000 mL | Freq: Once | INTRAVENOUS | Status: AC
  Administered 2019-07-30: 250 mL via INTRAVENOUS
  Filled 2019-07-30: qty 250

## 2019-07-30 NOTE — Progress Notes (Signed)
5051: Hillary/[facility] lab called Plt <5 and Hgb 9.1. 0940: Dr. Irene Limbo notified - orders received

## 2019-07-30 NOTE — Patient Instructions (Signed)

## 2019-07-31 LAB — BPAM PLATELET PHERESIS
Blood Product Expiration Date: 202106232359
Blood Product Expiration Date: 202106242359
ISSUE DATE / TIME: 202106221111
ISSUE DATE / TIME: 202106221212
Unit Type and Rh: 5100
Unit Type and Rh: 6200

## 2019-07-31 LAB — PREPARE PLATELET PHERESIS
Unit division: 0
Unit division: 0

## 2019-08-02 ENCOUNTER — Telehealth: Payer: Self-pay | Admitting: Hematology

## 2019-08-02 NOTE — Telephone Encounter (Signed)
Scheduled per 06/22 los, patient has been called and voicemail was left. 

## 2019-08-09 ENCOUNTER — Inpatient Hospital Stay: Payer: Medicare Other

## 2019-08-09 ENCOUNTER — Other Ambulatory Visit: Payer: Self-pay | Admitting: *Deleted

## 2019-08-09 ENCOUNTER — Inpatient Hospital Stay: Payer: Medicare Other | Attending: Hematology

## 2019-08-09 ENCOUNTER — Other Ambulatory Visit: Payer: Self-pay

## 2019-08-09 DIAGNOSIS — D696 Thrombocytopenia, unspecified: Secondary | ICD-10-CM

## 2019-08-09 DIAGNOSIS — D649 Anemia, unspecified: Secondary | ICD-10-CM

## 2019-08-09 DIAGNOSIS — D469 Myelodysplastic syndrome, unspecified: Secondary | ICD-10-CM

## 2019-08-09 LAB — TYPE AND SCREEN
ABO/RH(D): A POS
Antibody Screen: NEGATIVE

## 2019-08-09 LAB — CBC WITH DIFFERENTIAL/PLATELET
Abs Immature Granulocytes: 0.01 10*3/uL (ref 0.00–0.07)
Basophils Absolute: 0 10*3/uL (ref 0.0–0.1)
Basophils Relative: 0 %
Eosinophils Absolute: 0 10*3/uL (ref 0.0–0.5)
Eosinophils Relative: 1 %
HCT: 30.1 % — ABNORMAL LOW (ref 39.0–52.0)
Hemoglobin: 9.4 g/dL — ABNORMAL LOW (ref 13.0–17.0)
Immature Granulocytes: 0 %
Lymphocytes Relative: 29 %
Lymphs Abs: 0.8 10*3/uL (ref 0.7–4.0)
MCH: 31.1 pg (ref 26.0–34.0)
MCHC: 31.2 g/dL (ref 30.0–36.0)
MCV: 99.7 fL (ref 80.0–100.0)
Monocytes Absolute: 0.2 10*3/uL (ref 0.1–1.0)
Monocytes Relative: 6 %
Neutro Abs: 1.9 10*3/uL (ref 1.7–7.7)
Neutrophils Relative %: 64 %
Platelets: <5 10*3/uL — CL (ref 150–400)
RBC: 3.02 MIL/uL — ABNORMAL LOW (ref 4.22–5.81)
RDW: 18.3 % — ABNORMAL HIGH (ref 11.5–15.5)
WBC: 2.9 10*3/uL — ABNORMAL LOW (ref 4.0–10.5)
nRBC: 0 % (ref 0.0–0.2)

## 2019-08-09 MED ORDER — ACETAMINOPHEN 325 MG PO TABS
ORAL_TABLET | ORAL | Status: AC
  Filled 2019-08-09: qty 2

## 2019-08-09 MED ORDER — METHYLPREDNISOLONE SODIUM SUCC 40 MG IJ SOLR
INTRAMUSCULAR | Status: AC
  Filled 2019-08-09: qty 1

## 2019-08-09 MED ORDER — METHYLPREDNISOLONE SODIUM SUCC 40 MG IJ SOLR
40.0000 mg | Freq: Once | INTRAMUSCULAR | Status: AC
  Administered 2019-08-09: 40 mg via INTRAVENOUS

## 2019-08-09 MED ORDER — SODIUM CHLORIDE 0.9% IV SOLUTION
250.0000 mL | Freq: Once | INTRAVENOUS | Status: AC
  Administered 2019-08-09: 250 mL via INTRAVENOUS
  Filled 2019-08-09: qty 250

## 2019-08-09 MED ORDER — ACETAMINOPHEN 325 MG PO TABS
650.0000 mg | ORAL_TABLET | Freq: Once | ORAL | Status: AC
  Administered 2019-08-09: 650 mg via ORAL

## 2019-08-09 NOTE — Patient Instructions (Signed)

## 2019-08-09 NOTE — Progress Notes (Signed)
0933-Pam/lab called - platelets less than 5 Dr. Irene Limbo informed - 2 units platelets ordered

## 2019-08-11 LAB — BPAM PLATELET PHERESIS
Blood Product Expiration Date: 202107022359
Blood Product Expiration Date: 202107052359
ISSUE DATE / TIME: 202107021104
ISSUE DATE / TIME: 202107021213
Unit Type and Rh: 7300
Unit Type and Rh: 7300

## 2019-08-11 LAB — PREPARE PLATELET PHERESIS
Unit division: 0
Unit division: 0

## 2019-08-13 ENCOUNTER — Other Ambulatory Visit: Payer: Medicare Other

## 2019-08-27 ENCOUNTER — Telehealth: Payer: Self-pay | Admitting: *Deleted

## 2019-08-27 ENCOUNTER — Inpatient Hospital Stay: Payer: Medicare Other

## 2019-08-27 ENCOUNTER — Other Ambulatory Visit: Payer: Self-pay

## 2019-08-27 ENCOUNTER — Other Ambulatory Visit: Payer: Self-pay | Admitting: *Deleted

## 2019-08-27 DIAGNOSIS — D469 Myelodysplastic syndrome, unspecified: Secondary | ICD-10-CM | POA: Diagnosis not present

## 2019-08-27 DIAGNOSIS — D696 Thrombocytopenia, unspecified: Secondary | ICD-10-CM

## 2019-08-27 LAB — CBC WITH DIFFERENTIAL/PLATELET
Abs Immature Granulocytes: 0 10*3/uL (ref 0.00–0.07)
Basophils Absolute: 0 10*3/uL (ref 0.0–0.1)
Basophils Relative: 0 %
Eosinophils Absolute: 0 10*3/uL (ref 0.0–0.5)
Eosinophils Relative: 1 %
HCT: 29.9 % — ABNORMAL LOW (ref 39.0–52.0)
Hemoglobin: 9.3 g/dL — ABNORMAL LOW (ref 13.0–17.0)
Immature Granulocytes: 0 %
Lymphocytes Relative: 26 %
Lymphs Abs: 0.6 10*3/uL — ABNORMAL LOW (ref 0.7–4.0)
MCH: 31.4 pg (ref 26.0–34.0)
MCHC: 31.1 g/dL (ref 30.0–36.0)
MCV: 101 fL — ABNORMAL HIGH (ref 80.0–100.0)
Monocytes Absolute: 0.2 10*3/uL (ref 0.1–1.0)
Monocytes Relative: 6 %
Neutro Abs: 1.7 10*3/uL (ref 1.7–7.7)
Neutrophils Relative %: 67 %
Platelets: <5 10*3/uL — CL (ref 150–400)
RBC: 2.96 MIL/uL — ABNORMAL LOW (ref 4.22–5.81)
RDW: 18.4 % — ABNORMAL HIGH (ref 11.5–15.5)
WBC: 2.5 10*3/uL — ABNORMAL LOW (ref 4.0–10.5)
nRBC: 0 % (ref 0.0–0.2)

## 2019-08-27 MED ORDER — METHYLPREDNISOLONE SODIUM SUCC 40 MG IJ SOLR
INTRAMUSCULAR | Status: AC
  Filled 2019-08-27: qty 1

## 2019-08-27 MED ORDER — SODIUM CHLORIDE 0.9% IV SOLUTION
250.0000 mL | Freq: Once | INTRAVENOUS | Status: AC
  Administered 2019-08-27: 250 mL via INTRAVENOUS
  Filled 2019-08-27: qty 250

## 2019-08-27 MED ORDER — METHYLPREDNISOLONE SODIUM SUCC 40 MG IJ SOLR
40.0000 mg | Freq: Once | INTRAMUSCULAR | Status: AC
  Administered 2019-08-27: 40 mg via INTRAVENOUS

## 2019-08-27 MED ORDER — ACETAMINOPHEN 325 MG PO TABS
ORAL_TABLET | ORAL | Status: AC
  Filled 2019-08-27: qty 2

## 2019-08-27 MED ORDER — ACETAMINOPHEN 325 MG PO TABS
650.0000 mg | ORAL_TABLET | Freq: Once | ORAL | Status: AC
  Administered 2019-08-27: 650 mg via ORAL

## 2019-08-27 NOTE — Patient Instructions (Signed)

## 2019-08-27 NOTE — Telephone Encounter (Signed)
CRITICAL VALUE STICKER  CRITICAL VALUE: Platelets less than 5   RECEIVER (on-site recipient of call):Sandi K, RN  DATE & TIME NOTIFIED: 1118  MESSENGER (representative from lab): Anderson Malta  MD NOTIFIED: Dr. Irene Limbo   TIME OF NOTIFICATION: 1130  RESPONSE: Orders for 2 units platelets received

## 2019-08-28 LAB — BPAM PLATELET PHERESIS
Blood Product Expiration Date: 202107212359
Blood Product Expiration Date: 202107222359
ISSUE DATE / TIME: 202107201258
ISSUE DATE / TIME: 202107201408
Unit Type and Rh: 7300
Unit Type and Rh: 7300

## 2019-08-28 LAB — PREPARE PLATELET PHERESIS
Unit division: 0
Unit division: 0

## 2019-09-10 ENCOUNTER — Telehealth: Payer: Self-pay | Admitting: *Deleted

## 2019-09-10 ENCOUNTER — Other Ambulatory Visit: Payer: Self-pay

## 2019-09-10 ENCOUNTER — Other Ambulatory Visit: Payer: Self-pay | Admitting: *Deleted

## 2019-09-10 ENCOUNTER — Inpatient Hospital Stay: Payer: Medicare Other | Attending: Hematology

## 2019-09-10 ENCOUNTER — Inpatient Hospital Stay: Payer: Medicare Other

## 2019-09-10 DIAGNOSIS — D696 Thrombocytopenia, unspecified: Secondary | ICD-10-CM

## 2019-09-10 DIAGNOSIS — D649 Anemia, unspecified: Secondary | ICD-10-CM

## 2019-09-10 DIAGNOSIS — D469 Myelodysplastic syndrome, unspecified: Secondary | ICD-10-CM | POA: Insufficient documentation

## 2019-09-10 LAB — CBC WITH DIFFERENTIAL (CANCER CENTER ONLY)
Abs Immature Granulocytes: 0 10*3/uL (ref 0.00–0.07)
Basophils Absolute: 0 10*3/uL (ref 0.0–0.1)
Basophils Relative: 0 %
Eosinophils Absolute: 0.1 10*3/uL (ref 0.0–0.5)
Eosinophils Relative: 2 %
HCT: 29.4 % — ABNORMAL LOW (ref 39.0–52.0)
Hemoglobin: 9.1 g/dL — ABNORMAL LOW (ref 13.0–17.0)
Immature Granulocytes: 0 %
Lymphocytes Relative: 29 %
Lymphs Abs: 0.7 10*3/uL (ref 0.7–4.0)
MCH: 31 pg (ref 26.0–34.0)
MCHC: 31 g/dL (ref 30.0–36.0)
MCV: 100 fL (ref 80.0–100.0)
Monocytes Absolute: 0.1 10*3/uL (ref 0.1–1.0)
Monocytes Relative: 6 %
Neutro Abs: 1.6 10*3/uL — ABNORMAL LOW (ref 1.7–7.7)
Neutrophils Relative %: 63 %
Platelet Count: 5 10*3/uL — CL (ref 150–400)
RBC: 2.94 MIL/uL — ABNORMAL LOW (ref 4.22–5.81)
RDW: 18.1 % — ABNORMAL HIGH (ref 11.5–15.5)
WBC Count: 2.5 10*3/uL — ABNORMAL LOW (ref 4.0–10.5)
nRBC: 0 % (ref 0.0–0.2)

## 2019-09-10 LAB — TYPE AND SCREEN
ABO/RH(D): A POS
Antibody Screen: NEGATIVE

## 2019-09-10 MED ORDER — METHYLPREDNISOLONE SODIUM SUCC 40 MG IJ SOLR
40.0000 mg | Freq: Once | INTRAMUSCULAR | Status: AC
  Administered 2019-09-10: 40 mg via INTRAVENOUS

## 2019-09-10 MED ORDER — TRANEXAMIC ACID 650 MG PO TABS: 650.0000 mg | ORAL_TABLET | Freq: Two times a day (BID) | ORAL | 3 refills | Status: DC

## 2019-09-10 MED ORDER — ACETAMINOPHEN 325 MG PO TABS
650.0000 mg | ORAL_TABLET | Freq: Once | ORAL | Status: AC
  Administered 2019-09-10: 650 mg via ORAL

## 2019-09-10 MED ORDER — METHYLPREDNISOLONE SODIUM SUCC 40 MG IJ SOLR
INTRAMUSCULAR | Status: AC
  Filled 2019-09-10: qty 1

## 2019-09-10 MED ORDER — ACETAMINOPHEN 325 MG PO TABS
ORAL_TABLET | ORAL | Status: AC
  Filled 2019-09-10: qty 2

## 2019-09-10 MED ORDER — TRANEXAMIC ACID 650 MG PO TABS: 650.0000 mg | ORAL_TABLET | Freq: Two times a day (BID) | ORAL | 0 refills | Status: DC

## 2019-09-10 MED ORDER — SODIUM CHLORIDE 0.9% IV SOLUTION
250.0000 mL | Freq: Once | INTRAVENOUS | Status: AC
  Administered 2019-09-10: 250 mL via INTRAVENOUS
  Filled 2019-09-10: qty 250

## 2019-09-10 NOTE — Patient Instructions (Signed)
Thrombocytopenia Thrombocytopenia means that you have a low number of platelets in your blood. Platelets are tiny cells in the blood. When you bleed, they clump together at the cut or injury to stop the bleeding. This is called blood clotting. If you do not have enough platelets, it can cause bleeding problems. Some cases of this condition are mild while others are more severe. What are the causes? This condition may be caused by:  Your body not making enough platelets. This may be caused by: ? Your bone marrow not making blood cells (aplastic anemia). ? Cancer in the bone marrow. ? Certain medicines. ? Infection in the bone marrow. ? Drinking a lot of alcohol.  Your body destroying platelets too quickly. This may be caused by: ? Certain immune diseases. ? Certain medicines. ? Certain blood clotting disorders. ? Certain disorders that are passed from parent to child (inherited). ? Certain bleeding disorders. ? Pregnancy. ? Having a spleen that is larger than normal. What are the signs or symptoms?  Bleeding that is not normal.  Nosebleeds.  Heavy menstrual periods.  Blood in the pee (urine) or poop (stool).  A purple-like color to the skin (purpura).  Bruising.  A rash that looks like pinpoint, purple-red spots (petechiae). How is this treated?  Treatment of another condition that is causing the low platelet count.  Medicines to help protect your platelets from being destroyed.  A replacement (transfusion) of platelets to stop or prevent bleeding.  Surgery to remove the spleen. Follow these instructions at home: Activity  Avoid activities that could cause you to get hurt or bruised. Follow instructions about how to prevent falls.  Take care not to cut yourself: ? When you shave. ? When you use scissors, needles, knives, or other tools.  Take care not to burn yourself: ? When you use an iron. ? When you cook. General instructions   Check your skin and the  inside of your mouth for bruises or blood as told by your doctor.  Check to see if there is blood in your spit (sputum), pee, and poop. Do this as told by your doctor.  Do not drink alcohol.  Take over-the-counter and prescription medicines only as told by your doctor.  Do not take any medicines that have aspirin or NSAIDs in them. These medicines can thin your blood and cause you to bleed.  Tell all of your doctors that you have this condition. Be sure to tell your dentist and eye doctor too. Contact a doctor if:  You have bruises and you do not know why. Get help right away if:  You are bleeding anywhere on your body.  You have blood in your spit, pee, or poop. Summary  Thrombocytopenia means that you have a low number of platelets in your blood.  Platelets are needed for blood clotting.  Symptoms of this condition include bleeding that is not normal, and bruising.  Take care not to cut or burn yourself. This information is not intended to replace advice given to you by your health care provider. Make sure you discuss any questions you have with your health care provider. Document Revised: 10/26/2017 Document Reviewed: 10/26/2017 Elsevier Patient Education  2020 [employer].  

## 2019-09-10 NOTE — Telephone Encounter (Signed)
Patient wife requested refill of Lysteda sent to [facility] and a 10 day supply sent to [facility] for 10 days to last until mail order rx arrived. Dr. Irene Limbo informed of request and gave verbal order to do so. 10 day supply ordered at [facility] this encounter

## 2019-09-10 NOTE — Telephone Encounter (Signed)
Patient wife requested refill of Lysteda sent to [facility] and a 10 day supply sent to [facility] for 10 days to last until mail order rx arrived. Dr. Irene Limbo informed of request and gave verbal order to do so. [facility] this encounter.

## 2019-09-11 LAB — PREPARE PLATELET PHERESIS
Unit division: 0
Unit division: 0

## 2019-09-11 LAB — BPAM PLATELET PHERESIS
Blood Product Expiration Date: 202108042359
Blood Product Expiration Date: 202108042359
ISSUE DATE / TIME: 202108031314
ISSUE DATE / TIME: 202108031422
Unit Type and Rh: 6200
Unit Type and Rh: 7300

## 2019-09-20 ENCOUNTER — Other Ambulatory Visit: Payer: Self-pay | Admitting: *Deleted

## 2019-09-20 DIAGNOSIS — D469 Myelodysplastic syndrome, unspecified: Secondary | ICD-10-CM

## 2019-09-20 DIAGNOSIS — D649 Anemia, unspecified: Secondary | ICD-10-CM

## 2019-09-24 ENCOUNTER — Other Ambulatory Visit: Payer: Self-pay

## 2019-09-24 ENCOUNTER — Inpatient Hospital Stay: Payer: Medicare Other

## 2019-09-24 ENCOUNTER — Other Ambulatory Visit: Payer: Self-pay | Admitting: *Deleted

## 2019-09-24 DIAGNOSIS — D696 Thrombocytopenia, unspecified: Secondary | ICD-10-CM

## 2019-09-24 DIAGNOSIS — D649 Anemia, unspecified: Secondary | ICD-10-CM

## 2019-09-24 DIAGNOSIS — D469 Myelodysplastic syndrome, unspecified: Secondary | ICD-10-CM

## 2019-09-24 LAB — CBC WITH DIFFERENTIAL (CANCER CENTER ONLY)
Abs Immature Granulocytes: 0 10*3/uL (ref 0.00–0.07)
Basophils Absolute: 0 10*3/uL (ref 0.0–0.1)
Basophils Relative: 0 %
Eosinophils Absolute: 0 10*3/uL (ref 0.0–0.5)
Eosinophils Relative: 0 %
HCT: 28 % — ABNORMAL LOW (ref 39.0–52.0)
Hemoglobin: 8.5 g/dL — ABNORMAL LOW (ref 13.0–17.0)
Immature Granulocytes: 0 %
Lymphocytes Relative: 26 %
Lymphs Abs: 0.7 10*3/uL (ref 0.7–4.0)
MCH: 30.6 pg (ref 26.0–34.0)
MCHC: 30.4 g/dL (ref 30.0–36.0)
MCV: 100.7 fL — ABNORMAL HIGH (ref 80.0–100.0)
Monocytes Absolute: 0.3 10*3/uL (ref 0.1–1.0)
Monocytes Relative: 9 %
Neutro Abs: 1.8 10*3/uL (ref 1.7–7.7)
Neutrophils Relative %: 65 %
Platelet Count: <5 10*3/uL — CL (ref 150–400)
RBC: 2.78 MIL/uL — ABNORMAL LOW (ref 4.22–5.81)
RDW: 18.3 % — ABNORMAL HIGH (ref 11.5–15.5)
WBC Count: 2.8 10*3/uL — ABNORMAL LOW (ref 4.0–10.5)
nRBC: 0 % (ref 0.0–0.2)

## 2019-09-24 LAB — SAMPLE TO BLOOD BANK

## 2019-09-24 MED ORDER — METHYLPREDNISOLONE SODIUM SUCC 40 MG IJ SOLR
INTRAMUSCULAR | Status: AC
  Filled 2019-09-24: qty 1

## 2019-09-24 MED ORDER — ACETAMINOPHEN 325 MG PO TABS
650.0000 mg | ORAL_TABLET | Freq: Once | ORAL | Status: AC
  Administered 2019-09-24: 650 mg via ORAL

## 2019-09-24 MED ORDER — METHYLPREDNISOLONE SODIUM SUCC 40 MG IJ SOLR
30.0000 mg | Freq: Once | INTRAMUSCULAR | Status: AC
  Administered 2019-09-24: 30 mg via INTRAVENOUS

## 2019-09-24 MED ORDER — ACETAMINOPHEN 325 MG PO TABS
ORAL_TABLET | ORAL | Status: AC
  Filled 2019-09-24: qty 2

## 2019-09-24 MED ORDER — SODIUM CHLORIDE 0.9% IV SOLUTION
250.0000 mL | Freq: Once | INTRAVENOUS | Status: DC
  Filled 2019-09-24: qty 250

## 2019-09-24 NOTE — Patient Instructions (Signed)

## 2019-09-25 LAB — PREPARE PLATELET PHERESIS
Unit division: 0
Unit division: 0

## 2019-09-25 LAB — BPAM PLATELET PHERESIS
Blood Product Expiration Date: 202108192359
Blood Product Expiration Date: 202108202359
ISSUE DATE / TIME: 202108171313
ISSUE DATE / TIME: 202108171408
Unit Type and Rh: 5100
Unit Type and Rh: 600

## 2019-10-08 ENCOUNTER — Inpatient Hospital Stay: Payer: Medicare Other

## 2019-10-08 ENCOUNTER — Telehealth: Payer: Self-pay | Admitting: *Deleted

## 2019-10-08 ENCOUNTER — Other Ambulatory Visit: Payer: Self-pay

## 2019-10-08 ENCOUNTER — Other Ambulatory Visit: Payer: Self-pay | Admitting: *Deleted

## 2019-10-08 DIAGNOSIS — D469 Myelodysplastic syndrome, unspecified: Secondary | ICD-10-CM

## 2019-10-08 DIAGNOSIS — D649 Anemia, unspecified: Secondary | ICD-10-CM

## 2019-10-08 LAB — CBC WITH DIFFERENTIAL (CANCER CENTER ONLY)
Abs Immature Granulocytes: 0 10*3/uL (ref 0.00–0.07)
Basophils Absolute: 0 10*3/uL (ref 0.0–0.1)
Basophils Relative: 0 %
Eosinophils Absolute: 0 10*3/uL (ref 0.0–0.5)
Eosinophils Relative: 1 %
HCT: 27 % — ABNORMAL LOW (ref 39.0–52.0)
Hemoglobin: 8.4 g/dL — ABNORMAL LOW (ref 13.0–17.0)
Immature Granulocytes: 0 %
Lymphocytes Relative: 22 %
Lymphs Abs: 0.7 10*3/uL (ref 0.7–4.0)
MCH: 30.9 pg (ref 26.0–34.0)
MCHC: 31.1 g/dL (ref 30.0–36.0)
MCV: 99.3 fL (ref 80.0–100.0)
Monocytes Absolute: 0.4 10*3/uL (ref 0.1–1.0)
Monocytes Relative: 13 %
Neutro Abs: 2.1 10*3/uL (ref 1.7–7.7)
Neutrophils Relative %: 64 %
Platelet Count: <5 10*3/uL — CL (ref 150–400)
RBC: 2.72 MIL/uL — ABNORMAL LOW (ref 4.22–5.81)
RDW: 18.8 % — ABNORMAL HIGH (ref 11.5–15.5)
WBC Count: 3.2 10*3/uL — ABNORMAL LOW (ref 4.0–10.5)
nRBC: 0 % (ref 0.0–0.2)

## 2019-10-08 LAB — SAMPLE TO BLOOD BANK

## 2019-10-08 MED ORDER — METHYLPREDNISOLONE SODIUM SUCC 40 MG IJ SOLR
40.0000 mg | Freq: Once | INTRAMUSCULAR | Status: AC
  Administered 2019-10-08: 40 mg via INTRAVENOUS

## 2019-10-08 MED ORDER — SODIUM CHLORIDE 0.9% IV SOLUTION
250.0000 mL | Freq: Once | INTRAVENOUS | Status: AC
  Administered 2019-10-08: 250 mL via INTRAVENOUS
  Filled 2019-10-08: qty 250

## 2019-10-08 MED ORDER — ACETAMINOPHEN 325 MG PO TABS
ORAL_TABLET | ORAL | Status: AC
  Filled 2019-10-08: qty 2

## 2019-10-08 MED ORDER — METHYLPREDNISOLONE SODIUM SUCC 40 MG IJ SOLR
INTRAMUSCULAR | Status: AC
  Filled 2019-10-08: qty 1

## 2019-10-08 MED ORDER — ACETAMINOPHEN 325 MG PO TABS
650.0000 mg | ORAL_TABLET | Freq: Once | ORAL | Status: AC
  Administered 2019-10-08: 650 mg via ORAL

## 2019-10-08 NOTE — Telephone Encounter (Signed)
1130 -Notified by Bevely Palmer, LPN that platelets <5. Dr. Irene Limbo notified. MD gave verbal order for 2 units platelets.

## 2019-10-08 NOTE — Progress Notes (Signed)
CRITICAL VALUE STICKER  CRITICAL VALUE:Platelet count<5  RECEIVER (on-site recipient of call):Lavern Crimi  DATE & TIME NOTIFIED: 10/08/19 1130am  MESSENGER (representative from lab):Jay  MD NOTIFIED: Katharine Look for [facility]  TIME OF NOTIFICATION:1133am  RESPONSE: message received

## 2019-10-08 NOTE — Patient Instructions (Signed)

## 2019-10-09 LAB — BPAM PLATELET PHERESIS
Blood Product Expiration Date: 202109042359
Blood Product Expiration Date: 202109042359
ISSUE DATE / TIME: 202108311301
ISSUE DATE / TIME: 202108311341
Unit Type and Rh: 5100
Unit Type and Rh: 6200

## 2019-10-09 LAB — PREPARE PLATELET PHERESIS
Unit division: 0
Unit division: 0

## 2019-10-22 ENCOUNTER — Inpatient Hospital Stay: Payer: Medicare Other

## 2019-10-22 ENCOUNTER — Inpatient Hospital Stay: Payer: Medicare Other | Attending: Hematology

## 2019-10-22 ENCOUNTER — Other Ambulatory Visit: Payer: Self-pay | Admitting: *Deleted

## 2019-10-22 ENCOUNTER — Other Ambulatory Visit: Payer: Self-pay

## 2019-10-22 ENCOUNTER — Inpatient Hospital Stay (HOSPITAL_BASED_OUTPATIENT_CLINIC_OR_DEPARTMENT_OTHER): Payer: Medicare Other | Admitting: Hematology

## 2019-10-22 VITALS — BP 111/59 | HR 90 | Temp 97.3°F | Resp 18 | Ht 70.5 in | Wt 161.4 lb

## 2019-10-22 DIAGNOSIS — D469 Myelodysplastic syndrome, unspecified: Secondary | ICD-10-CM

## 2019-10-22 DIAGNOSIS — D7589 Other specified diseases of blood and blood-forming organs: Secondary | ICD-10-CM | POA: Insufficient documentation

## 2019-10-22 DIAGNOSIS — D696 Thrombocytopenia, unspecified: Secondary | ICD-10-CM

## 2019-10-22 DIAGNOSIS — E039 Hypothyroidism, unspecified: Secondary | ICD-10-CM | POA: Diagnosis not present

## 2019-10-22 DIAGNOSIS — D61818 Other pancytopenia: Secondary | ICD-10-CM | POA: Diagnosis not present

## 2019-10-22 DIAGNOSIS — Z23 Encounter for immunization: Secondary | ICD-10-CM | POA: Diagnosis not present

## 2019-10-22 DIAGNOSIS — D649 Anemia, unspecified: Secondary | ICD-10-CM

## 2019-10-22 LAB — CBC WITH DIFFERENTIAL (CANCER CENTER ONLY)
Abs Immature Granulocytes: 0 10*3/uL (ref 0.00–0.07)
Basophils Absolute: 0 10*3/uL (ref 0.0–0.1)
Basophils Relative: 0 %
Eosinophils Absolute: 0 10*3/uL (ref 0.0–0.5)
Eosinophils Relative: 1 %
HCT: 32 % — ABNORMAL LOW (ref 39.0–52.0)
Hemoglobin: 10 g/dL — ABNORMAL LOW (ref 13.0–17.0)
Immature Granulocytes: 0 %
Lymphocytes Relative: 26 %
Lymphs Abs: 0.9 10*3/uL (ref 0.7–4.0)
MCH: 31 pg (ref 26.0–34.0)
MCHC: 31.3 g/dL (ref 30.0–36.0)
MCV: 99.1 fL (ref 80.0–100.0)
Monocytes Absolute: 0.3 10*3/uL (ref 0.1–1.0)
Monocytes Relative: 9 %
Neutro Abs: 2.3 10*3/uL (ref 1.7–7.7)
Neutrophils Relative %: 64 %
Platelet Count: 6 10*3/uL — CL (ref 150–400)
RBC: 3.23 MIL/uL — ABNORMAL LOW (ref 4.22–5.81)
RDW: 18.6 % — ABNORMAL HIGH (ref 11.5–15.5)
WBC Count: 3.5 10*3/uL — ABNORMAL LOW (ref 4.0–10.5)
nRBC: 0 % (ref 0.0–0.2)

## 2019-10-22 MED ORDER — METHYLPREDNISOLONE SODIUM SUCC 40 MG IJ SOLR
INTRAMUSCULAR | Status: AC
  Filled 2019-10-22: qty 1

## 2019-10-22 MED ORDER — ACETAMINOPHEN 325 MG PO TABS
ORAL_TABLET | ORAL | Status: AC
  Filled 2019-10-22: qty 2

## 2019-10-22 MED ORDER — ACETAMINOPHEN 325 MG PO TABS
650.0000 mg | ORAL_TABLET | Freq: Once | ORAL | Status: AC
  Administered 2019-10-22: 650 mg via ORAL

## 2019-10-22 MED ORDER — METHYLPREDNISOLONE SODIUM SUCC 40 MG IJ SOLR
30.0000 mg | Freq: Once | INTRAMUSCULAR | Status: AC
  Administered 2019-10-22: 30 mg via INTRAVENOUS

## 2019-10-22 MED ORDER — SODIUM CHLORIDE 0.9% IV SOLUTION
250.0000 mL | Freq: Once | INTRAVENOUS | Status: AC
  Administered 2019-10-22: 250 mL via INTRAVENOUS
  Filled 2019-10-22: qty 250

## 2019-10-22 NOTE — Progress Notes (Signed)
HEMATOLOGY/ONCOLOGY CLINIC NOTE  Date of Service: 10/22/2019  Patient Care Team: Chesley Noon, MD as PCP - General (Family Medicine)  CHIEF COMPLAINTS/PURPOSE OF CONSULTATION:  Myelodysplastic Syndrome  Oncologic History:   Shaun Hobbs initially presented to care with pancytopenia and was evaluated with a BM Bx on 05/16/06 which did not reveal evidence of abnormal myeloid maturation or other abnormalities, and was without chromosomal abnormality as well. A BM Bx was repeated on 08/28/07 which did reveal dyserythropoiesis and some dysplasia. A 04/17/12 Bm biopsy revealed mild dysplasia, hypercellular marrow with 1.5% blasts, and a minute CD5 positive monoclonal B-cell population detected by flow. 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 His most recent 12/27/17 Bm Bx revealed 30% cellularity with trilineage hematopoiesis, mild megakaryocytic hyperplasia with dyspoietic changes, mild erythroid hyperplasia with ringed sideroblasts, and a minute population of monoclonal B-cells detected by flow.   The pt started 12m Revlimid for 3 months, beginning 09/03/10 "without noticeable improvement." He later began Vidaza, and completed his 21st cycle the week of November 30, 2017.  HISTORY OF PRESENTING ILLNESS:   Shaun UREYis a wonderful 86y.o. male who [facility] [place] [facility] [place] [facility] [place]. He is accompanied today by his wife and daughter. The pt reports that he is doing well overall.  The pt's wife reports that the first thing that occurred related to the patient's MDS was that his PLT were seen to be reduced to 99k, 12 years ago. The pt was evaluated with repeat BM biopsies over the last 12 years, and began Revlimid  for only 3 months in 2012. The pt's wife notes that he began Vidaza a little less than two years ago, after his PLT dropped to about 60k, and his last dose of Vidaza was in late October 2019. The pt has had one blood transfusion ever, which was in November 2019. The pt's wife notes that during this time of not taking Vidaza, she has not noticed a change in her husband's energy levels or day to day activities. The pt denies dizziness, light headedness, fatigue, abnormal bruising, nose bleeds, gum bleeds, or other concerns for bleeding. The pt and wife deny recent or frequent infections.  The pt's wife notes that Vidaza was stopped to "take a break," trend his counts, and [place] His last BM Bx was November 2019.  The pt and his wife note that there have not been other recent medical concerns. She notes that the pt's last A1C was 4.9.  The pt's wife notes that he is current with his annual flu vaccine, both every 5-year Prevnar and Pneumovax, and shingles vaccine.  The pt's wife notes that the pt's memory is "slowly decreasing," and he no longer drives. The pt's [facility] current memory loss," and has taken Aricept for 20 years.  Most recent lab results (02/19/18) of CBC w/diff and CMP is as follows: all values are WNL except for WBC at 1.5k, RBC at 2.42, HGB at 7.9, HCT at 23.3, MCH at 32.2, PLT at 28k, RDW at 18.8, ANC at 700, Lymphocytes at 600, Monocytes at 200, Glucose at 111, BUN at 28.  On review of systems, pt  reports stable energy levels, eating well, stable weight, good appetite, and denies fevers, fatigue, light headedness, dizziness, nose bleeds, gum bleeds, abnormal bruising, other concerns for bleeding, falls, recent infections, frequent infections, bone pains, problems passing urine, abdominal pains, lower abdominal pains, and any other symptoms.  On PMHx the pt reports MDS, HLD, Early onset Alzheimer's  dementia, Hypothyroidism, hypertrophy or prostate with urinary obstruction.   Interval History:   Shaun Hobbs returns today for management and evaluation of his MDS. We are joined today by Mrs. Asman. The patient's last visit with [place] was on 07/30/2019. The pt reports that he is doing well overall.  The pt reports that he continues taking Lysteda and denies any major bleeding or bruising. He has been eating and feeling well and denies any new or progressive fatigue. Mrs. Kawai notes that the pt has occasional diarrhea.  Lab results today (10/22/19) of CBC w/diff and CMP is as follows: all values are WNL except for WBC at 3.5K, RBC at 3.23, Hgb at 10.0, HCT at 32.0, RDW at 18.6, PLT at 6K.  On review of systems, pt reports occasional diarrhea and denies abnormal/excessive bruising, abnormal/excessive bleeding, low appetite, abdominal pain, fatigue and any other symptoms.    MEDICAL HISTORY:  MDS, HLD, Early onset Alzheimer's dementia, Hypothyroidism, hypertrophy or prostate with urinary obstruction.   SURGICAL HISTORY: BM Bx   SOCIAL HISTORY: Social History   Socioeconomic History  . Marital status: Married    Spouse name: Not on file  . Number of children: Not on file  . Years of education: Not on file  . Highest education level: Not on file  Occupational History  . Not on file  Tobacco Use  . Smoking status: Former Smoker    Quit date: 1981    Years since quitting: 40.7  . Smokeless tobacco: Never Used  Vaping Use  . Vaping Use: Never used  Substance and Sexual Activity  . Alcohol use: Yes  . Drug use: Never  . Sexual activity: Not Currently  Other Topics Concern  . Not on file  Social History Narrative  . Not on file   Social Determinants of Health   Financial Resource Strain:   . Difficulty of Paying Living Expenses: Not on file  Food Insecurity:   . Worried About Charity fundraiser in the Last Year: Not on file  . Ran Out of Food in the Last Year: Not on  file  Transportation Needs:   . Lack of Transportation (Medical): Not on file  . Lack of Transportation (Non-Medical): Not on file  Physical Activity:   . Days of Exercise per Week: Not on file  . Minutes of Exercise per Session: Not on file  Stress:   . Feeling of Stress : Not on file  Social Connections:   . Frequency of Communication with Friends and Family: Not on file  . Frequency of Social Gatherings with Friends and Family: Not on file  . Attends Religious Services: Not on file  . Active Member of Clubs or Organizations: Not on file  . Attends Archivist Meetings: Not on file  . Marital Status: Not on file  Intimate Partner Violence:   . Fear of Current or Ex-Partner: Not on file  . Emotionally Abused: Not on file  . Physically Abused: Not on file  . Sexually Abused: Not on file    FAMILY HISTORY: No family history on file.  ALLERGIES:  is allergic to sulfa antibiotics.  MEDICATIONS:  Current Outpatient Medications  Medication Sig Dispense Refill  . Cholecalciferol (VITAMIN D3 PO) Take 1,000 Units by mouth daily.     Marland Kitchen donepezil (ARICEPT) 10 MG tablet Take 10 mg by mouth at bedtime.    Marland Kitchen levothyroxine (SYNTHROID, LEVOTHROID) 100 MCG tablet Take 100 mcg by mouth daily before breakfast.    . MAGNESIUM PO Take 400 mg by mouth daily.     . memantine (NAMENDA) 10 MG tablet Take by mouth.    . mupirocin ointment (BACTROBAN) 2 % Apply 1 application topically 2 (two) times daily. To area of induration on forearm 22 g 2  . sertraline (ZOLOFT) 100 MG tablet Take 100 mg by mouth at bedtime.    . tamsulosin (FLOMAX) 0.4 MG CAPS capsule Take 0.4 mg by mouth at bedtime.    . tranexamic acid (LYSTEDA) 650 MG TABS tablet Take 1 tablet (650 mg total) by mouth 2 (two) times daily. 60 tablet 3  . tranexamic acid (LYSTEDA) 650 MG TABS tablet Take 1 tablet (650 mg total) by mouth 2 (two) times daily. 10 day supply for patient until mail order is received 20 tablet 0   No current  facility-administered medications for this visit.    REVIEW OF SYSTEMS:   A 10+ POINT REVIEW OF SYSTEMS WAS OBTAINED including neurology, dermatology, psychiatry, cardiac, respiratory, lymph, extremities, GI, GU, Musculoskeletal, constitutional, breasts, reproductive, HEENT.  All pertinent positives are noted in the HPI.  All others are negative.   PHYSICAL EXAMINATION: ECOG PERFORMANCE STATUS: 2-3   . Vitals:   10/22/19 1024  BP: (!) 111/59  Pulse: 90  Resp: 18  Temp: (!) 97.3 F (36.3 C)  SpO2: 100%   Filed Weights   10/22/19 1024  Weight: 161 lb 6.4 oz (73.2 kg)   .Body mass index is 22.83 kg/m.   GENERAL:alert, in no acute distress and comfortable SKIN: no acute rashes, no significant lesions EYES: conjunctiva are pink and non-injected, sclera anicteric OROPHARYNX: MMM, no exudates, no oropharyngeal erythema or ulceration NECK: supple, no JVD LYMPH:  no palpable lymphadenopathy in the cervical, axillary or inguinal regions LUNGS: clear to auscultation b/l with normal respiratory effort HEART: regular rate & rhythm ABDOMEN:  normoactive bowel sounds , non tender, not distended. No palpable hepatosplenomegaly.  Extremity: no pedal edema PSYCH: alert & oriented x 3 with fluent speech NEURO: no focal motor/sensory deficits  LABORATORY DATA:  I have reviewed the data as listed  . CBC Latest Ref Rng & Units 10/22/2019 10/08/2019 09/24/2019  WBC 4.0 - 10.5 K/uL 3.5(L) 3.2(L) 2.8(L)  Hemoglobin 13.0 - 17.0 g/dL 10.0(L) 8.4(L) 8.5(L)  Hematocrit 39 - 52 % 32.0(L) 27.0(L) 28.0(L)  Platelets 150 - 400 K/uL 6(LL) <5(LL) <5(LL)   ANC 500 . CMP Latest Ref Rng & Units 03/26/2019 02/26/2019 01/15/2019  Glucose 70 - 99 mg/dL 100(H) 130(H) 124(H)  BUN 8 - 23 mg/dL 25(H) 20 15  Creatinine 0.61 - 1.24 mg/dL 0.89 0.92 0.80  Sodium 135 - 145 mmol/L 140 139 138  Potassium 3.5 - 5.1 mmol/L 4.4 4.1 4.2  Chloride 98 - 111 mmol/L 109 108 105  CO2 22 - 32 mmol/L _0 Calcium 8.9 -  10.3 mg/dL 8.5(L) 8.2(L) 8.2(L)  Total Protein 6.5 - 8.1 g/dL 6.5 6.2(L) 6.0(L)  Total Bilirubin 0.3 - 1.2 mg/dL 0.4 0.5 0.4  Alkaline Phos 38 - 126 U/L 60 52 56  AST 15 - 41 U/L 14(L) 12(L) 14(L)  ALT 0 - 44 U/L 9 <6 8  CBC w/diff and CMP:    11/19/15 BM Bx:   11/19/15 Cytogenetics:   12/27/17 Bm Bx:    RADIOGRAPHIC STUDIES: I have personally reviewed the radiological images as listed and agreed with the findings in the report. No results found.  ASSESSMENT & PLAN:  86 y.o. male with:  1. 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 No evidence for a lymphoproliferative disorder. Normal cytogenetics. 08/28/07 BM Bx revealed mild dyserythropoiesis and a suggestion of some dysplasia within the myeloid series. 04/17/12 BM Bx revealed hypercellular marrow with 50% cellularity, 1.5% blasts, with trilineage hematopoiesis, marrow monocytosis, mild myeloid and megakaryocytic dysplasia, adequate storage and iron utilization, no ring sideroblasts. Small monoclonal B lymphocytic population of uncertain significance. 11/19/15 BM Bx revealed Hypercellular marrow for age about 70% with panmyelosis including increased megakaryocytes with atypia, moderate marrow monocytosis about 15-20% and <5% blasts. Minute CD5 positive monoclonal B-cell population. Storage iron present. 12/27/17 BM Bx revealed normocellular marrow for age with 30% cellularity and trilineage hematopoiesis, mild megakaryocytic hyperplasia with dyspoietic changes, mild erythroid hyperplasia with ringed sideroblasts, and minute population of monoclonal B cells detected by flow.  S/p 21 cycles of Vidaza completed in the week of November 30, 2017. Previous oncologic notes suggest that the patient's pancytopenia was  continuing to worsen through [place] treatment.  Labs upon initial presentation from 02/19/18, WBC at 1.5k, HGB at 7.9, PLT at 28k, [place] at 700   PLAN: -Discussed pt labwork today, 10/22/19; WBC & Hgb have improved, PLT are steady -No indication for PRBC transfusion today. Advised pt that PLT transfusions alone are not holding his PLT.  -Discussed again using IVIG and Nplate to improve PLT count. Advised pt that IVIG has less associated risk than rpt blood transfusions.  -Discussed CDC guidelines regarding COVID19 booster. Will give pt in clinic today.  -Recommend pt receive the annual flu vaccine. Will give in clinic in two weeks. -Will continue 1 unit of PRBC & 2 units of PLT q2weeks with labs  -Will begin weekly IVIG x2 once weekly starting in 1 week  -Continue Lysteda -Will see back in 4 weeks    FOLLOW UP: Covid booster after platelet transfusion today Chemo-counselling for IVIG Plz schedule for IVIG weekly x 2 doses starting in 1 week Flu shot in 2 weeks  Plz schedule, labs, 1 unit of PRBC and 2 units of Platelets q2weeks x 8 RTC with Dr Irene Limbo in 4 weeks   The total time spent in the appt was 30 minutes and more than 50% was on counseling and direct patient cares.  All of the patient's questions were answered with apparent satisfaction. The patient knows to call the clinic with any problems, questions or concerns.    Sullivan Lone MD [place] AAHIVMS [facility] Hematology/Oncology Physician [facility]  (Office):       [phone] (Work cell):  [phone] (Fax):           [phone]  10/22/2019 11:18 AM  I, Yevette Edwards, am acting as a scribe for Dr. Sullivan Lone.   .I have reviewed the above documentation for accuracy and completeness, and I agree with the above. Brunetta Genera MD

## 2019-10-22 NOTE — Progress Notes (Signed)
   Covid-19 Vaccination Clinic  Name:  Shaun Hobbs    MRN: [id number] DOB: 10/15/1928  10/22/2019  [name] was observed post Covid-19 immunization for 30 mins without incident. He was provided with Vaccine Information Sheet and instruction to access the V-Safe system.   [name] was instructed to call 911 with any severe reactions post vaccine: Marland Kitchen Difficulty breathing  . Swelling of face and throat  . A fast heartbeat  . A bad rash all over body  . Dizziness and weakness

## 2019-10-22 NOTE — Patient Instructions (Signed)

## 2019-10-23 LAB — PREPARE PLATELET PHERESIS
Unit division: 0
Unit division: 0

## 2019-10-23 LAB — BPAM PLATELET PHERESIS
Blood Product Expiration Date: 202109162359
Blood Product Expiration Date: 202109162359
ISSUE DATE / TIME: 202109141217
ISSUE DATE / TIME: 202109141328
Unit Type and Rh: 5100
Unit Type and Rh: 5100

## 2019-10-24 ENCOUNTER — Other Ambulatory Visit: Payer: Self-pay | Admitting: *Deleted

## 2019-10-24 ENCOUNTER — Other Ambulatory Visit: Payer: Self-pay | Admitting: Hematology

## 2019-10-24 DIAGNOSIS — D693 Immune thrombocytopenic purpura: Secondary | ICD-10-CM

## 2019-10-24 DIAGNOSIS — Z7189 Other specified counseling: Secondary | ICD-10-CM | POA: Insufficient documentation

## 2019-10-25 ENCOUNTER — Inpatient Hospital Stay: Payer: Medicare Other

## 2019-10-25 ENCOUNTER — Telehealth: Payer: Self-pay | Admitting: Hematology

## 2019-10-25 NOTE — Telephone Encounter (Signed)
Called patient regarding upcoming 09/20 appointments, voicemail is full...could not leave a voicemail.

## 2019-10-28 ENCOUNTER — Inpatient Hospital Stay: Payer: Medicare Other

## 2019-10-28 ENCOUNTER — Other Ambulatory Visit: Payer: Self-pay

## 2019-10-28 VITALS — BP 105/60 | HR 58 | Temp 97.8°F | Resp 18

## 2019-10-28 DIAGNOSIS — D693 Immune thrombocytopenic purpura: Secondary | ICD-10-CM

## 2019-10-28 DIAGNOSIS — D469 Myelodysplastic syndrome, unspecified: Secondary | ICD-10-CM | POA: Diagnosis not present

## 2019-10-28 DIAGNOSIS — Z7189 Other specified counseling: Secondary | ICD-10-CM

## 2019-10-28 MED ORDER — IMMUNE GLOBULIN (HUMAN) 10 GM/100ML IV SOLN
1.0000 g/kg | Freq: Once | INTRAVENOUS | Status: AC
  Administered 2019-10-28: 75 g via INTRAVENOUS
  Filled 2019-10-28: qty 400

## 2019-10-28 MED ORDER — DIPHENHYDRAMINE HCL 25 MG PO TABS
25.0000 mg | ORAL_TABLET | Freq: Once | ORAL | Status: AC
  Administered 2019-10-28: 25 mg via ORAL
  Filled 2019-10-28: qty 1

## 2019-10-28 MED ORDER — ACETAMINOPHEN 325 MG PO TABS
ORAL_TABLET | ORAL | Status: AC
  Filled 2019-10-28: qty 2

## 2019-10-28 MED ORDER — FAMOTIDINE IN NACL 20-0.9 MG/50ML-% IV SOLN
INTRAVENOUS | Status: AC
  Filled 2019-10-28: qty 50

## 2019-10-28 MED ORDER — METHYLPREDNISOLONE SODIUM SUCC 125 MG IJ SOLR
60.0000 mg | Freq: Once | INTRAMUSCULAR | Status: AC
  Administered 2019-10-28: 60 mg via INTRAVENOUS

## 2019-10-28 MED ORDER — DIPHENHYDRAMINE HCL 25 MG PO CAPS
ORAL_CAPSULE | ORAL | Status: AC
  Filled 2019-10-28: qty 1

## 2019-10-28 MED ORDER — DEXTROSE 5 % IV SOLN
Freq: Once | INTRAVENOUS | Status: AC
  Filled 2019-10-28: qty 250

## 2019-10-28 MED ORDER — ACETAMINOPHEN 325 MG PO TABS
650.0000 mg | ORAL_TABLET | Freq: Once | ORAL | Status: AC
  Administered 2019-10-28: 650 mg via ORAL

## 2019-10-28 MED ORDER — FAMOTIDINE IN NACL 20-0.9 MG/50ML-% IV SOLN
20.0000 mg | Freq: Once | INTRAVENOUS | Status: AC
  Administered 2019-10-28: 20 mg via INTRAVENOUS

## 2019-10-28 MED ORDER — METHYLPREDNISOLONE SODIUM SUCC 125 MG IJ SOLR
INTRAMUSCULAR | Status: AC
  Filled 2019-10-28: qty 2

## 2019-10-28 NOTE — Patient Instructions (Signed)
Immune Globulin Injection What is this medicine? IMMUNE GLOBULIN (im MUNE GLOB yoo lin) helps to prevent or reduce the severity of certain infections in patients who are at risk. This medicine is collected from the pooled blood of many donors. It is used to treat immune system problems, thrombocytopenia, and Kawasaki syndrome. This medicine may be used for other purposes; ask your health care provider or pharmacist if you have questions. COMMON BRAND NAME(S): ASCENIV, Baygam, BIVIGAM, Carimune, Carimune NF, cutaquig, Cuvitru, Flebogamma, Flebogamma DIF, GamaSTAN, GamaSTAN S/D, Gamimune N, Gammagard, Gammagard S/D, Gammaked, Gammaplex, Gammar-P IV, Gamunex, Gamunex-C, Hizentra, Iveegam, Iveegam EN, Octagam, Panglobulin, Panglobulin NF, panzyga, Polygam S/D, Privigen, Sandoglobulin, Venoglobulin-S, Vigam, Vivaglobulin, Xembify What should I tell my health care provider before I take this medicine? They need to know if you have any of these conditions:  diabetes  extremely low or no immune antibodies in the blood  heart disease  history of blood clots  hyperprolinemia  infection in the blood, sepsis  kidney disease  recently received or scheduled to receive a vaccination  an unusual or allergic reaction to human immune globulin, albumin, maltose, sucrose, other medicines, foods, dyes, or preservatives  pregnant or trying to get pregnant  breast-feeding How should I use this medicine? This medicine is for injection into a muscle or infusion into a vein or skin. It is usually given by a health care professional in a hospital or clinic setting. In rare cases, some brands of this medicine might be given at home. You will be taught how to give this medicine. Use exactly as directed. Take your medicine at regular intervals. Do not take your medicine more often than directed. Talk to your pediatrician regarding the use of this medicine in children. While this drug may be prescribed for selected  conditions, precautions do apply. Overdosage: If you think you have taken too much of this medicine contact a poison control center or emergency room at once. NOTE: This medicine is only for you. Do not share this medicine with others. What if I miss a dose? It is important not to miss your dose. Call your doctor or health care professional if you are unable to keep an appointment. If you give yourself the medicine and you miss a dose, take it as soon as you can. If it is almost time for your next dose, take only that dose. Do not take double or extra doses. What may interact with this medicine?  aspirin and aspirin-like medicines  cisplatin  cyclosporine  medicines for infection like acyclovir, adefovir, amphotericin B, bacitracin, cidofovir, foscarnet, ganciclovir, gentamicin, pentamidine, vancomycin  NSAIDS, medicines for pain and inflammation, like ibuprofen or naproxen  pamidronate  vaccines  zoledronic acid This list may not describe all possible interactions. Give your health care provider a list of all the medicines, herbs, non-prescription drugs, or dietary supplements you use. Also tell them if you smoke, drink alcohol, or use illegal drugs. Some items may interact with your medicine. What should I watch for while using this medicine? Your condition will be monitored carefully while you are receiving this medicine. This medicine is made from pooled blood donations of many different people. It may be possible to pass an infection in this medicine. However, the donors are screened for infections and all products are tested for HIV and hepatitis. The medicine is treated to kill most or all bacteria and viruses. Talk to your doctor about the risks and benefits of this medicine. Do not have vaccinations for at least   14 days before, or until at least 3 months after receiving this medicine. What side effects may I notice from receiving this medicine? Side effects that you should report  to your doctor or health care professional as soon as possible:  allergic reactions like skin rash, itching or hives, swelling of the face, lips, or tongue  blue colored lips or skin  breathing problems  chest pain or tightness  fever  signs and symptoms of aseptic meningitis such as stiff neck; sensitivity to light; headache; drowsiness; fever; nausea; vomiting; rash  signs and symptoms of a blood clot such as chest pain; shortness of breath; pain, swelling, or warmth in the leg  signs and symptoms of hemolytic anemia such as fast heartbeat; tiredness; dark yellow or brown urine; or yellowing of the eyes or skin  signs and symptoms of kidney injury like trouble passing urine or change in the amount of urine  sudden weight gain  swelling of the ankles, feet, hands Side effects that usually do not require medical attention (report to your doctor or health care professional if they continue or are bothersome):  diarrhea  flushing  headache  increased sweating  joint pain  muscle cramps  muscle pain  nausea  pain, redness, or irritation at site where injected  tiredness This list may not describe all possible side effects. Call your doctor for medical advice about side effects. You may report side effects to FDA at 1-800-FDA-1088. Where should I keep my medicine? Keep out of the reach of children. This drug is usually given in a hospital or clinic and will not be stored at home. In rare cases, some brands of this medicine may be given at home. If you are using this medicine at home, you will be instructed on how to store this medicine. Throw away any unused medicine after the expiration date on the label. NOTE: This sheet is a summary. It may not cover all possible information. If you have questions about this medicine, talk to your doctor, pharmacist, or health care provider.  2020 Elsevier/Gold Standard (2018-08-29 12:51:14)  

## 2019-11-04 ENCOUNTER — Ambulatory Visit: Payer: Self-pay

## 2019-11-05 ENCOUNTER — Other Ambulatory Visit: Payer: Self-pay

## 2019-11-05 ENCOUNTER — Inpatient Hospital Stay: Payer: Medicare Other

## 2019-11-05 ENCOUNTER — Other Ambulatory Visit: Payer: Self-pay | Admitting: *Deleted

## 2019-11-05 VITALS — BP 99/58 | HR 55 | Temp 97.9°F | Resp 16

## 2019-11-05 DIAGNOSIS — D469 Myelodysplastic syndrome, unspecified: Secondary | ICD-10-CM

## 2019-11-05 DIAGNOSIS — D649 Anemia, unspecified: Secondary | ICD-10-CM

## 2019-11-05 DIAGNOSIS — D693 Immune thrombocytopenic purpura: Secondary | ICD-10-CM

## 2019-11-05 DIAGNOSIS — D696 Thrombocytopenia, unspecified: Secondary | ICD-10-CM

## 2019-11-05 DIAGNOSIS — Z23 Encounter for immunization: Secondary | ICD-10-CM

## 2019-11-05 LAB — CBC WITH DIFFERENTIAL (CANCER CENTER ONLY)
Abs Immature Granulocytes: 0.01 10*3/uL (ref 0.00–0.07)
Basophils Absolute: 0 10*3/uL (ref 0.0–0.1)
Basophils Relative: 0 %
Eosinophils Absolute: 0 10*3/uL (ref 0.0–0.5)
Eosinophils Relative: 0 %
HCT: 26.6 % — ABNORMAL LOW (ref 39.0–52.0)
Hemoglobin: 8.2 g/dL — ABNORMAL LOW (ref 13.0–17.0)
Immature Granulocytes: 0 %
Lymphocytes Relative: 23 %
Lymphs Abs: 1.3 10*3/uL (ref 0.7–4.0)
MCH: 31.1 pg (ref 26.0–34.0)
MCHC: 30.8 g/dL (ref 30.0–36.0)
MCV: 100.8 fL — ABNORMAL HIGH (ref 80.0–100.0)
Monocytes Absolute: 0.7 10*3/uL (ref 0.1–1.0)
Monocytes Relative: 13 %
Neutro Abs: 3.5 10*3/uL (ref 1.7–7.7)
Neutrophils Relative %: 64 %
Platelet Count: 5 10*3/uL — CL (ref 150–400)
RBC: 2.64 MIL/uL — ABNORMAL LOW (ref 4.22–5.81)
RDW: 19.3 % — ABNORMAL HIGH (ref 11.5–15.5)
WBC Count: 5.5 10*3/uL (ref 4.0–10.5)
nRBC: 0.4 % — ABNORMAL HIGH (ref 0.0–0.2)

## 2019-11-05 MED ORDER — METHYLPREDNISOLONE SODIUM SUCC 125 MG IJ SOLR
INTRAMUSCULAR | Status: AC
  Filled 2019-11-05: qty 2

## 2019-11-05 MED ORDER — INFLUENZA VAC A&B SA ADJ QUAD 0.5 ML IM PRSY
0.5000 mL | PREFILLED_SYRINGE | Freq: Once | INTRAMUSCULAR | Status: AC
  Administered 2019-11-05: 0.5 mL via INTRAMUSCULAR

## 2019-11-05 MED ORDER — ACETAMINOPHEN 325 MG PO TABS
650.0000 mg | ORAL_TABLET | Freq: Once | ORAL | Status: AC
  Administered 2019-11-05: 650 mg via ORAL

## 2019-11-05 MED ORDER — METHYLPREDNISOLONE SODIUM SUCC 40 MG IJ SOLR
INTRAMUSCULAR | Status: AC
  Filled 2019-11-05: qty 1

## 2019-11-05 MED ORDER — INFLUENZA VAC A&B SA ADJ QUAD 0.5 ML IM PRSY
PREFILLED_SYRINGE | INTRAMUSCULAR | Status: AC
  Filled 2019-11-05: qty 0.5

## 2019-11-05 MED ORDER — METHYLPREDNISOLONE SODIUM SUCC 40 MG IJ SOLR
30.0000 mg | Freq: Once | INTRAMUSCULAR | Status: AC
  Administered 2019-11-05: 30 mg via INTRAVENOUS

## 2019-11-05 MED ORDER — ACETAMINOPHEN 325 MG PO TABS
ORAL_TABLET | ORAL | Status: AC
  Filled 2019-11-05: qty 2

## 2019-11-05 MED ORDER — SODIUM CHLORIDE 0.9% IV SOLUTION
250.0000 mL | Freq: Once | INTRAVENOUS | Status: AC
  Administered 2019-11-05: 250 mL via INTRAVENOUS
  Filled 2019-11-05: qty 250

## 2019-11-05 NOTE — Patient Instructions (Signed)

## 2019-11-06 LAB — BPAM PLATELET PHERESIS
Blood Product Expiration Date: 202109302359
Blood Product Expiration Date: 202109302359
ISSUE DATE / TIME: 202109281335
ISSUE DATE / TIME: 202109281506
Unit Type and Rh: 5100
Unit Type and Rh: 6200

## 2019-11-06 LAB — PREPARE PLATELET PHERESIS
Unit division: 0
Unit division: 0

## 2019-11-11 ENCOUNTER — Other Ambulatory Visit: Payer: Self-pay | Admitting: Hematology

## 2019-11-11 ENCOUNTER — Inpatient Hospital Stay: Payer: Medicare Other | Attending: Hematology

## 2019-11-11 ENCOUNTER — Other Ambulatory Visit: Payer: Self-pay

## 2019-11-11 VITALS — BP 104/50 | HR 62 | Temp 97.7°F | Resp 18

## 2019-11-11 DIAGNOSIS — D469 Myelodysplastic syndrome, unspecified: Secondary | ICD-10-CM | POA: Diagnosis not present

## 2019-11-11 DIAGNOSIS — D693 Immune thrombocytopenic purpura: Secondary | ICD-10-CM | POA: Insufficient documentation

## 2019-11-11 DIAGNOSIS — Z7189 Other specified counseling: Secondary | ICD-10-CM

## 2019-11-11 MED ORDER — LORATADINE 10 MG PO TABS
10.0000 mg | ORAL_TABLET | Freq: Once | ORAL | Status: AC
  Administered 2019-11-11: 10 mg via ORAL

## 2019-11-11 MED ORDER — ACETAMINOPHEN 325 MG PO TABS
ORAL_TABLET | ORAL | Status: AC
  Filled 2019-11-11: qty 2

## 2019-11-11 MED ORDER — IMMUNE GLOBULIN (HUMAN) 10 GM/100ML IV SOLN
1.0000 g/kg | Freq: Once | INTRAVENOUS | Status: AC
  Administered 2019-11-11: 75 g via INTRAVENOUS
  Filled 2019-11-11: qty 400

## 2019-11-11 MED ORDER — ACETAMINOPHEN 325 MG PO TABS
650.0000 mg | ORAL_TABLET | Freq: Once | ORAL | Status: AC
  Administered 2019-11-11: 650 mg via ORAL

## 2019-11-11 MED ORDER — LORATADINE 10 MG PO TABS
ORAL_TABLET | ORAL | Status: AC
  Filled 2019-11-11: qty 1

## 2019-11-11 MED ORDER — IMMUNE GLOBULIN (HUMAN) 5 GM/100ML IV SOLN: 1.0000 g/kg | Freq: Once | INTRAVENOUS | Status: DC

## 2019-11-11 NOTE — Patient Instructions (Signed)
Immune Globulin Injection What is this medicine? IMMUNE GLOBULIN (im MUNE GLOB yoo lin) helps to prevent or reduce the severity of certain infections in patients who are at risk. This medicine is collected from the pooled blood of many donors. It is used to treat immune system problems, thrombocytopenia, and Kawasaki syndrome. This medicine may be used for other purposes; ask your health care provider or pharmacist if you have questions. COMMON BRAND NAME(S): ASCENIV, Baygam, BIVIGAM, Carimune, Carimune NF, cutaquig, Cuvitru, Flebogamma, Flebogamma DIF, GamaSTAN, GamaSTAN S/D, Gamimune N, Gammagard, Gammagard S/D, Gammaked, Gammaplex, Gammar-P IV, Gamunex, Gamunex-C, Hizentra, Iveegam, Iveegam EN, Octagam, Panglobulin, Panglobulin NF, panzyga, Polygam S/D, Privigen, Sandoglobulin, Venoglobulin-S, Vigam, Vivaglobulin, Xembify What should I tell my health care provider before I take this medicine? They need to know if you have any of these conditions:  diabetes  extremely low or no immune antibodies in the blood  heart disease  history of blood clots  hyperprolinemia  infection in the blood, sepsis  kidney disease  recently received or scheduled to receive a vaccination  an unusual or allergic reaction to human immune globulin, albumin, maltose, sucrose, other medicines, foods, dyes, or preservatives  pregnant or trying to get pregnant  breast-feeding How should I use this medicine? This medicine is for injection into a muscle or infusion into a vein or skin. It is usually given by a health care professional in a hospital or clinic setting. In rare cases, some brands of this medicine might be given at home. You will be taught how to give this medicine. Use exactly as directed. Take your medicine at regular intervals. Do not take your medicine more often than directed. Talk to your pediatrician regarding the use of this medicine in children. While this drug may be prescribed for selected  conditions, precautions do apply. Overdosage: If you think you have taken too much of this medicine contact a poison control center or emergency room at once. NOTE: This medicine is only for you. Do not share this medicine with others. What if I miss a dose? It is important not to miss your dose. Call your doctor or health care professional if you are unable to keep an appointment. If you give yourself the medicine and you miss a dose, take it as soon as you can. If it is almost time for your next dose, take only that dose. Do not take double or extra doses. What may interact with this medicine?  aspirin and aspirin-like medicines  cisplatin  cyclosporine  medicines for infection like acyclovir, adefovir, amphotericin B, bacitracin, cidofovir, foscarnet, ganciclovir, gentamicin, pentamidine, vancomycin  NSAIDS, medicines for pain and inflammation, like ibuprofen or naproxen  pamidronate  vaccines  zoledronic acid This list may not describe all possible interactions. Give your health care provider a list of all the medicines, herbs, non-prescription drugs, or dietary supplements you use. Also tell them if you smoke, drink alcohol, or use illegal drugs. Some items may interact with your medicine. What should I watch for while using this medicine? Your condition will be monitored carefully while you are receiving this medicine. This medicine is made from pooled blood donations of many different people. It may be possible to pass an infection in this medicine. However, the donors are screened for infections and all products are tested for HIV and hepatitis. The medicine is treated to kill most or all bacteria and viruses. Talk to your doctor about the risks and benefits of this medicine. Do not have vaccinations for at least   14 days before, or until at least 3 months after receiving this medicine. What side effects may I notice from receiving this medicine? Side effects that you should report  to your doctor or health care professional as soon as possible:  allergic reactions like skin rash, itching or hives, swelling of the face, lips, or tongue  blue colored lips or skin  breathing problems  chest pain or tightness  fever  signs and symptoms of aseptic meningitis such as stiff neck; sensitivity to light; headache; drowsiness; fever; nausea; vomiting; rash  signs and symptoms of a blood clot such as chest pain; shortness of breath; pain, swelling, or warmth in the leg  signs and symptoms of hemolytic anemia such as fast heartbeat; tiredness; dark yellow or brown urine; or yellowing of the eyes or skin  signs and symptoms of kidney injury like trouble passing urine or change in the amount of urine  sudden weight gain  swelling of the ankles, feet, hands Side effects that usually do not require medical attention (report to your doctor or health care professional if they continue or are bothersome):  diarrhea  flushing  headache  increased sweating  joint pain  muscle cramps  muscle pain  nausea  pain, redness, or irritation at site where injected  tiredness This list may not describe all possible side effects. Call your doctor for medical advice about side effects. You may report side effects to FDA at 1-800-FDA-1088. Where should I keep my medicine? Keep out of the reach of children. This drug is usually given in a hospital or clinic and will not be stored at home. In rare cases, some brands of this medicine may be given at home. If you are using this medicine at home, you will be instructed on how to store this medicine. Throw away any unused medicine after the expiration date on the label. NOTE: This sheet is a summary. It may not cover all possible information. If you have questions about this medicine, talk to your doctor, pharmacist, or health care provider.  2020 Elsevier/Gold Standard (2018-08-29 12:51:14)  

## 2019-11-11 NOTE — Progress Notes (Signed)
Pt declined to stay for 30 minute post IVIG observation

## 2019-11-18 NOTE — Progress Notes (Signed)
HEMATOLOGY/ONCOLOGY CLINIC NOTE  Date of Service: 11/19/2019  Patient Care Team: Shaun Noon, MD as PCP - General (Family Medicine)  CHIEF COMPLAINTS/PURPOSE OF CONSULTATION:  Myelodysplastic Syndrome  Oncologic History:   Shaun Hobbs initially presented to care with pancytopenia and was evaluated with a BM Bx on 05/16/06 which did not reveal evidence of abnormal myeloid maturation or other abnormalities, and was without chromosomal abnormality as well. A BM Bx was repeated on 08/28/07 which did reveal dyserythropoiesis and some dysplasia. A 04/17/12 Bm biopsy revealed mild dysplasia, hypercellular marrow with 1.5% blasts, and a minute CD5 positive monoclonal B-cell population detected by flow. 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 His most recent 12/27/17 Bm Bx revealed 30% cellularity with trilineage hematopoiesis, mild megakaryocytic hyperplasia with dyspoietic changes, mild erythroid hyperplasia with ringed sideroblasts, and a minute population of monoclonal B-cells detected by flow.   The pt started 32m Revlimid for 3 months, beginning 09/03/10 "without noticeable improvement." He later began Vidaza, and completed his 21st cycle the week of November 30, 2017.  HISTORY OF PRESENTING ILLNESS:   Shaun GILKISONis a wonderful 86y.o. male who [place] [facility] [place] [facility]. He is accompanied today by his wife and daughter. The pt reports that he is doing well overall.  The pt's wife reports that the first thing that occurred related to the patient's MDS was that his PLT were seen to be reduced to 99k, 12 years ago. The pt was evaluated with repeat BM biopsies over the last 12 years, and began  Revlimid for only 3 months in 2012. The pt's wife notes that he began Vidaza a little less than two years ago, after his PLT dropped to about 60k, and his last dose of Vidaza was in late October 2019. The pt has had one blood transfusion ever, which was in November 2019. The pt's wife notes that during this time of not taking Vidaza, she has not noticed a change in her husband's energy levels or day to day activities. The pt denies dizziness, light headedness, fatigue, abnormal bruising, nose bleeds, gum bleeds, or other concerns for bleeding. The pt and wife deny recent or frequent infections.  The pt's wife notes that Vidaza was stopped to "take a break," trend his counts, [place] His [place] Bx was November 2019.  The pt and his wife note that there have not been other recent medical concerns. She notes that the pt's last A1C was 4.9.  The pt's wife notes that he is current with his annual flu vaccine, both every 5-year Prevnar and Pneumovax, and shingles vaccine.  The pt's wife notes that the pt's memory is "slowly decreasing," and he no longer drives. The pt's [facility]," and has taken Aricept for 20 years.  Most recent lab results (02/19/18) of CBC w/diff and CMP is as follows: all values are WNL except for WBC at 1.5k, RBC at 2.42, HGB at 7.9, HCT at 23.3, MCH at 32.2, PLT at 28k, RDW at 18.8, ANC at 700, Lymphocytes at 600, Monocytes at 200, Glucose at 111, BUN at 28.  On review of systems, pt  reports stable energy levels, eating well, stable weight, good appetite, and denies fevers, fatigue, light headedness, dizziness, nose bleeds, gum bleeds, abnormal bruising, other concerns for bleeding, falls, recent infections, frequent infections, bone pains, problems passing urine, abdominal pains, lower abdominal pains, and any other symptoms.  On PMHx the pt reports MDS, HLD, Early onset  Alzheimer's dementia, Hypothyroidism, hypertrophy or prostate with urinary obstruction.   Interval History:  Shaun Hobbs returns today for management and evaluation of his MDS. We are joined today by Shaun Hobbs and their daughter, Shaun Hobbs. The patient's last visit with [place] was on 10/22/2019. The pt reports that he is doing well overall.  The pt reports that he is feeling well overall, but is slightly more weak. Shaun Hobbs has noticed only slight bleeding from the pt's nose and ear. There has also been some occasional blood in his urine and on his underwear. Pt continues to take Lysteda. He is eating well and denies any significant weight loss.  Lab results today (11/19/19) of CBC w/diff is as follows: all values are WNL except for RBC at 2.58, Hgb at 7.9, HCT at 26.1, MCV at 101.2, RDW at 19.3, PLT at <5.  On review of systems, pt reports weakness, fatigue, hematuria and denies bone pain, melena, bloody stools, fevers, chills, abdominal pain, unexpected weight loss and any other symptoms.    MEDICAL HISTORY:  MDS, HLD, Early onset Alzheimer's dementia, Hypothyroidism, hypertrophy or prostate with urinary obstruction.   SURGICAL HISTORY: BM Bx   SOCIAL HISTORY: Social History   Socioeconomic History  . Marital status: Married    Spouse name: Not on file  . Number of children: Not on file  . Years of education: Not on file  . Highest education level: Not on file  Occupational History  . Not on file  Tobacco Use  . Smoking status: Former Smoker    Quit date: 1981    Years since quitting: 40.8  . Smokeless tobacco: Never Used  Vaping Use  . Vaping Use: Never used  Substance and Sexual Activity  . Alcohol use: Yes  . Drug use: Never  . Sexual activity: Not Currently  Other Topics Concern  . Not on file  Social History Narrative  . Not on file   Social Determinants of Health   Financial Resource Strain:   . Difficulty of Paying Living Expenses: Not on file  Food  Insecurity:   . Worried About Charity fundraiser in the Last Year: Not on file  . Ran Out of Food in the Last Year: Not on file  Transportation Needs:   . Lack of Transportation (Medical): Not on file  . Lack of Transportation (Non-Medical): Not on file  Physical Activity:   . Days of Exercise per Week: Not on file  . Minutes of Exercise per Session: Not on file  Stress:   . Feeling of Stress : Not on file  Social Connections:   . Frequency of Communication with Friends and Family: Not on file  . Frequency of Social Gatherings with Friends and Family: Not on file  . Attends Religious Services: Not on file  . Active Member of Clubs or Organizations: Not on file  . Attends Archivist Meetings: Not on file  . Marital Status: Not on file  Intimate Partner Violence:   . Fear of Current or Ex-Partner: Not on file  . Emotionally Abused: Not on file  . Physically Abused: Not on file  . Sexually Abused: Not  on file    FAMILY HISTORY: No family history on file.  ALLERGIES:  is allergic to sulfa antibiotics.  MEDICATIONS:  Current Outpatient Medications  Medication Sig Dispense Refill  . Cholecalciferol (VITAMIN D3 PO) Take 1,000 Units by mouth daily.     Marland Kitchen donepezil (ARICEPT) 10 MG tablet Take 10 mg by mouth at bedtime.    Marland Kitchen levothyroxine (SYNTHROID, LEVOTHROID) 100 MCG tablet Take 100 mcg by mouth daily before breakfast.    . MAGNESIUM PO Take 400 mg by mouth daily.     . memantine (NAMENDA) 10 MG tablet Take by mouth.    . mupirocin ointment (BACTROBAN) 2 % Apply 1 application topically 2 (two) times daily. To area of induration on forearm 22 g 2  . sertraline (ZOLOFT) 100 MG tablet Take 100 mg by mouth at bedtime.    . tamsulosin (FLOMAX) 0.4 MG CAPS capsule Take 0.4 mg by mouth at bedtime.    . tranexamic acid (LYSTEDA) 650 MG TABS tablet Take 1 tablet (650 mg total) by mouth 2 (two) times daily. 60 tablet 3  . tranexamic acid (LYSTEDA) 650 MG TABS tablet Take 1 tablet  (650 mg total) by mouth 2 (two) times daily. 10 day supply for patient until mail order is received 20 tablet 0   No current facility-administered medications for this visit.    REVIEW OF SYSTEMS:   A 10+ POINT REVIEW OF SYSTEMS WAS OBTAINED including neurology, dermatology, psychiatry, cardiac, respiratory, lymph, extremities, GI, GU, Musculoskeletal, constitutional, breasts, reproductive, HEENT.  All pertinent positives are noted in the HPI.  All others are negative.   PHYSICAL EXAMINATION: ECOG PERFORMANCE STATUS: 2-3   . Vitals:   11/19/19 0942  BP: (!) 106/52  Pulse: 70  Resp: 18  Temp: (!) 97.2 F (36.2 C)  SpO2: 100%   Filed Weights   11/19/19 0942  Weight: 165 lb 1.6 oz (74.9 kg)   .Body mass index is 23.35 kg/m.   GENERAL:alert, in no acute distress and comfortable SKIN: no acute rashes, no significant lesions EYES: conjunctiva are pink and non-injected, sclera anicteric OROPHARYNX: MMM, no exudates, no oropharyngeal erythema or ulceration NECK: supple, no JVD LYMPH:  no palpable lymphadenopathy in the cervical, axillary or inguinal regions LUNGS: clear to auscultation b/l with normal respiratory effort HEART: regular rate & rhythm ABDOMEN:  normoactive bowel sounds , non tender, not distended. No palpable hepatosplenomegaly.  Extremity: no pedal edema PSYCH: alert & oriented x 3 with fluent speech NEURO: no focal motor/sensory deficits  LABORATORY DATA:  I have reviewed the data as listed  . CBC Latest Ref Rng & Units 11/19/2019 11/05/2019 10/22/2019  WBC 4.0 - 10.5 K/uL 4.1 5.5 3.5(L)  Hemoglobin 13.0 - 17.0 g/dL 7.9(L) 8.2(L) 10.0(L)  Hematocrit 39 - 52 % 26.1(L) 26.6(L) 32.0(L)  Platelets 150 - 400 K/uL <5(LL) 5(LL) 6(LL)   ANC 500 . CMP Latest Ref Rng & Units 03/26/2019 02/26/2019 01/15/2019  Glucose 70 - 99 mg/dL 100(H) 130(H) 124(H)  BUN 8 - 23 mg/dL 25(H) 20 15  Creatinine 0.61 - 1.24 mg/dL 0.89 0.92 0.80  Sodium 135 - 145 mmol/L 140 139 138   Potassium 3.5 - 5.1 mmol/L 4.4 4.1 4.2  Chloride 98 - 111 mmol/L 109 108 105  CO2 22 - 32 mmol/L '26 24 24  ' Calcium 8.9 - 10.3 mg/dL 8.5(L) 8.2(L) 8.2(L)  Total Protein 6.5 - 8.1 g/dL 6.5 6.2(L) 6.0(L)  Total Bilirubin 0.3 - 1.2 mg/dL 0.4 0.5 0.4  Alkaline Phos 38 -  126 U/L 60 52 56  AST 15 - 41 U/L 14(L) 12(L) 14(L)  ALT 0 - 44 U/L 9 <6 8    CBC w/diff and CMP:    11/19/15 BM Bx:   11/19/15 Cytogenetics:   12/27/17 Bm Bx:    RADIOGRAPHIC STUDIES: I have personally reviewed the radiological images as listed and agreed with the findings in the report. No results found.  ASSESSMENT & PLAN:  86 y.o. male with:  1. 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 No evidence for a lymphoproliferative disorder. Normal cytogenetics. 08/28/07 BM Bx revealed mild dyserythropoiesis and a suggestion of some dysplasia within the myeloid series. 04/17/12 BM Bx revealed hypercellular marrow with 50% cellularity, 1.5% blasts, with trilineage hematopoiesis, marrow monocytosis, mild myeloid and megakaryocytic dysplasia, adequate storage and iron utilization, no ring sideroblasts. Small monoclonal B lymphocytic population of uncertain significance. 11/19/15 BM Bx revealed Hypercellular marrow for age about 70% with panmyelosis including increased megakaryocytes with atypia, moderate marrow monocytosis about 15-20% and <5% blasts. Minute CD5 positive monoclonal B-cell population. Storage iron present. 12/27/17 BM Bx revealed normocellular marrow for age with 30% cellularity and trilineage hematopoiesis, mild megakaryocytic hyperplasia with dyspoietic changes, mild erythroid hyperplasia with ringed sideroblasts, and minute population of monoclonal B cells detected by flow.  S/p 21 cycles of  Vidaza completed in the week of November 30, 2017. Previous oncologic notes suggest that the patient's pancytopenia was continuing to worsen through [place] treatment.  Labs upon initial presentation from 02/19/18, WBC at 1.5k, HGB at 7.9, PLT at 28k, [place] at 700   PLAN: -Discussed pt labwork today, 11/19/19; drop in Hgb, PLT are still nearly undetectable.  -Will give 1 unit of PRBC & 2 units of PLT today - continue q2weeks prn  -Advised pt that there does not appear to be an immune component to his thrombocytopenia. It is primarily caused by his MDS.  -Advised pt that there is some also refractory thrombocytopenia from repeat transfusions. -Advised pt that the increased weakness and fatigue is likely due to anemia.  -Recommend pt continue Lysteda to prevent excessive bleeding. -Recommend pt use a soft-bristle tooth brush and reduce shaving as much as possible.  -Recommend pt use humidifier if dry heat is used in the home and sterile saline nasal sprays to minimize nasal bleeding. -Continue B-complex vitamin -Will see back in 3 months    FOLLOW UP: Plz schedule, labs, 1 unit of PRBC and 2 units of Platelets q2weeks x 8 RTC with Dr Irene Limbo in 12 weeks    The total time spent in the appt was 20 minutes and more than 50% was on counseling and direct patient cares.  All of the patient's questions were answered with apparent satisfaction. The patient knows to call the clinic with any problems, questions or concerns.    Sullivan Lone MD [place] AAHIVMS [facility] Hematology/Oncology Physician [facility]  (Office):       [phone] (Work cell):  [phone] (Fax):           [phone]  11/19/2019 10:35 AM  I, Yevette Edwards, am acting as a scribe for Dr. Sullivan Lone.   .I have reviewed the above documentation for accuracy and completeness, and I agree with the above. Brunetta Genera MD

## 2019-11-19 ENCOUNTER — Other Ambulatory Visit: Payer: Self-pay | Admitting: *Deleted

## 2019-11-19 ENCOUNTER — Inpatient Hospital Stay (HOSPITAL_BASED_OUTPATIENT_CLINIC_OR_DEPARTMENT_OTHER): Payer: Medicare Other | Admitting: Hematology

## 2019-11-19 ENCOUNTER — Inpatient Hospital Stay: Payer: Medicare Other

## 2019-11-19 ENCOUNTER — Other Ambulatory Visit: Payer: Self-pay

## 2019-11-19 VITALS — BP 106/52 | HR 70 | Temp 97.2°F | Resp 18 | Ht 70.5 in | Wt 165.1 lb

## 2019-11-19 DIAGNOSIS — D469 Myelodysplastic syndrome, unspecified: Secondary | ICD-10-CM

## 2019-11-19 DIAGNOSIS — D649 Anemia, unspecified: Secondary | ICD-10-CM

## 2019-11-19 DIAGNOSIS — D693 Immune thrombocytopenic purpura: Secondary | ICD-10-CM

## 2019-11-19 DIAGNOSIS — D696 Thrombocytopenia, unspecified: Secondary | ICD-10-CM | POA: Diagnosis not present

## 2019-11-19 LAB — CBC WITH DIFFERENTIAL (CANCER CENTER ONLY)
Abs Immature Granulocytes: 0.01 10*3/uL (ref 0.00–0.07)
Basophils Absolute: 0 10*3/uL (ref 0.0–0.1)
Basophils Relative: 0 %
Eosinophils Absolute: 0 10*3/uL (ref 0.0–0.5)
Eosinophils Relative: 0 %
HCT: 26.1 % — ABNORMAL LOW (ref 39.0–52.0)
Hemoglobin: 7.9 g/dL — ABNORMAL LOW (ref 13.0–17.0)
Immature Granulocytes: 0 %
Lymphocytes Relative: 24 %
Lymphs Abs: 1 10*3/uL (ref 0.7–4.0)
MCH: 30.6 pg (ref 26.0–34.0)
MCHC: 30.3 g/dL (ref 30.0–36.0)
MCV: 101.2 fL — ABNORMAL HIGH (ref 80.0–100.0)
Monocytes Absolute: 0.4 10*3/uL (ref 0.1–1.0)
Monocytes Relative: 11 %
Neutro Abs: 2.6 10*3/uL (ref 1.7–7.7)
Neutrophils Relative %: 65 %
Platelet Count: <5 10*3/uL — CL (ref 150–400)
RBC: 2.58 MIL/uL — ABNORMAL LOW (ref 4.22–5.81)
RDW: 19.3 % — ABNORMAL HIGH (ref 11.5–15.5)
WBC Count: 4.1 10*3/uL (ref 4.0–10.5)
nRBC: 0 % (ref 0.0–0.2)

## 2019-11-19 LAB — PREPARE RBC (CROSSMATCH)

## 2019-11-19 LAB — SAMPLE TO BLOOD BANK

## 2019-11-19 MED ORDER — METHYLPREDNISOLONE SODIUM SUCC 40 MG IJ SOLR
30.0000 mg | Freq: Once | INTRAMUSCULAR | Status: AC
  Administered 2019-11-19: 30 mg via INTRAVENOUS

## 2019-11-19 MED ORDER — METHYLPREDNISOLONE SODIUM SUCC 40 MG IJ SOLR
INTRAMUSCULAR | Status: AC
  Filled 2019-11-19: qty 1

## 2019-11-19 MED ORDER — ACETAMINOPHEN 325 MG PO TABS
ORAL_TABLET | ORAL | Status: AC
  Filled 2019-11-19: qty 2

## 2019-11-19 MED ORDER — ACETAMINOPHEN 325 MG PO TABS
650.0000 mg | ORAL_TABLET | Freq: Once | ORAL | Status: AC
  Administered 2019-11-19: 650 mg via ORAL

## 2019-11-19 MED ORDER — SODIUM CHLORIDE 0.9% IV SOLUTION
250.0000 mL | Freq: Once | INTRAVENOUS | Status: AC
  Administered 2019-11-19: 250 mL via INTRAVENOUS
  Filled 2019-11-19: qty 250

## 2019-11-19 NOTE — Progress Notes (Signed)
CRITICAL VALUE STICKER CRITICAL VALUE: Plt <5; Hgb 7.9 DATE & TIME NOTIFIED: 11/19/19; 9872 MESSENGER (representative from lab):Pam MD NOTIFIED: Dr. Irene Limbo  TIME OF NOTIFICATION: 0945 RESPONSE: Acknowledged/Orders received

## 2019-11-19 NOTE — Patient Instructions (Signed)
Platelet Transfusion A platelet transfusion is a procedure in which you receive donated platelets through an IV. Platelets are tiny pieces of blood cells. When you get an injury, platelets clump together in the area to form a blood clot. This helps stop bleeding and is the beginning of the healing process. If you have too few platelets, your blood may have trouble clotting. This may cause you to bleed and bruise very easily. You may need a platelet transfusion if you have a condition that causes a low number of platelets (thrombocytopenia). A platelet transfusion may be used to stop or prevent excessive bleeding. Tell a health care provider about:  Any reactions you have had during previous transfusions.  Any allergies you have.  All medicines you are taking, including vitamins, herbs, eye drops, creams, and over-the-counter medicines.  Any blood disorders you have.  Any surgeries you have had.  Any medical conditions you have.  Whether you are pregnant or may be pregnant. What are the risks? Generally, this is a safe procedure. However, problems may occur, including:  Fever.  Infection.  Allergic reaction to the donor platelets.  Your body's disease-fighting system (immune system) attacking the donor platelets (hemolytic reaction). This is rare.  A rare reaction that causes lung damage (transfusion-related acute lung injury). What happens before the procedure? Medicines  Ask your health care provider about: ? Changing or stopping your regular medicines. This is especially important if you are taking diabetes medicines or blood thinners. ? Taking medicines such as aspirin and ibuprofen. These medicines can thin your blood. Do not take these medicines unless your health care provider tells you to take them. ? Taking over-the-counter medicines, vitamins, herbs, and supplements. General instructions  You will have a blood test to determine your blood type. Your blood type  determines what kind of platelets you will be given.  Follow instructions from your health care provider about eating or drinking restrictions.  If you have had an allergic reaction to a transfusion in the past, you may be given medicine to help prevent a reaction.  Your temperature, blood pressure, pulse, and breathing will be monitored. What happens during the procedure?   An IV will be inserted into one of your veins.  For your safety, two health care providers will verify your identity along with the donor platelets about to be infused.  A bag of donor platelets will be connected to your IV. The platelets will flow into your bloodstream. This usually takes 30-60 minutes.  Your temperature, blood pressure, pulse, and breathing will be monitored during the transfusion. This helps detect early signs of any reaction.  You will also be monitored for other symptoms that may indicate a reaction, including chills, hives, or itching.  If you have signs of a reaction at any time, your transfusion will be stopped, and you may be given medicine to help manage the reaction.  When your transfusion is complete, your IV will be removed.  Pressure may be applied to the IV site for a few minutes to stop any bleeding.  The IV site will be covered with a bandage (dressing). The procedure may vary among health care providers and hospitals. What happens after the procedure?  Your blood pressure, temperature, pulse, and breathing will be monitored until you leave the hospital or clinic.  You may have some bruising and soreness at your IV site. Follow these instructions at home: Medicines  Take over-the-counter and prescription medicines only as told by your health care provider.    Talk with your health care provider before you take any medicines that contain aspirin or NSAIDs. These medicines increase your risk for dangerous bleeding. General instructions  Change or remove your dressing as told  by your health care provider.  Return to your normal activities as told by your health care provider. Ask your health care provider what activities are safe for you.  Do not take baths, swim, or use a hot tub until your health care provider approves. Ask your health care provider if you may take showers.  Check your IV site every day for signs of infection. Check for: ? Redness, swelling, or pain. ? Fluid or blood. If fluid or blood drains from your IV site, use your hands to press down firmly on a bandage covering the area for a minute or two. Doing this should stop the bleeding. ? Warmth. ? Pus or a bad smell.  Keep all follow-up visits as told by your health care provider. This is important. Contact a health care provider if you have:  A headache that does not go away with medicine.  Hives, rash, or itchy skin.  Nausea or vomiting.  Unusual tiredness or weakness.  Signs of infection at your IV site. Get help right away if:  You have a fever or chills.  You urinate less often than usual.  Your urine is darker colored than normal.  You have any of the following: ? Trouble breathing. ? Pain in your back, abdomen, or chest. ? Cool, clammy skin. ? A fast heartbeat. Summary  Platelets are tiny pieces of blood cells that clump together to form a blood clot when you have an injury. If you have too few platelets, your blood may have trouble clotting.  A platelet transfusion is a procedure in which you receive donated platelets through an IV.  A platelet transfusion may be used to stop or prevent excessive bleeding.  After the procedure, check your IV site every day for signs of infection, including redness, swelling, pain, or warmth. This information is not intended to replace advice given to you by your health care provider. Make sure you discuss any questions you have with your health care provider. Document Revised: 03/01/2017 Document Reviewed: 03/01/2017 Elsevier  Patient Education  2020 [employer]   Blood Transfusion, Adult, Care After This sheet gives you information about how to care for yourself after your procedure. Your doctor may also give you more specific instructions. If you have problems or questions, contact your doctor. What can I expect after the procedure? After the procedure, it is common to have:  Bruising and soreness at the IV site.  A fever or chills on the day of the procedure. This may be your body's response to the new blood cells received.  A headache. Follow these instructions at home: Insertion site care      Follow instructions from your doctor about how to take care of your insertion site. This is where an IV tube was put into your vein. Make sure you: ? Wash your hands with soap and water before and after you change your bandage (dressing). If you cannot use soap and water, use hand sanitizer. ? Change your bandage as told by your doctor.  Check your insertion site every day for signs of infection. Check for: ? Redness, swelling, or pain. ? Bleeding from the site. ? Warmth. ? Pus or a bad smell. General instructions  Take over-the-counter and prescription medicines only as told by your doctor.  Rest   as told by your doctor.  Go back to your normal activities as told by your doctor.  Keep all follow-up visits as told by your doctor. This is important. Contact a doctor if:  You have itching or red, swollen areas of skin (hives).  You feel worried or nervous (anxious).  You feel weak after doing your normal activities.  You have redness, swelling, warmth, or pain around the insertion site.  You have blood coming from the insertion site, and the blood does not stop with pressure.  You have pus or a bad smell coming from the insertion site. Get help right away if:  You have signs of a serious reaction. This may be coming from an allergy or the body's defense system (immune system). Signs  include: ? Trouble breathing or shortness of breath. ? Swelling of the face or feeling warm (flushed). ? Fever or chills. ? Head, chest, or back pain. ? Dark pee (urine) or blood in the pee. ? Widespread rash. ? Fast heartbeat. ? Feeling dizzy or light-headed. You may receive your blood transfusion in an outpatient setting. If so, you will be told whom to contact to report any reactions. These symptoms may be an emergency. Do not wait to see if the symptoms will go away. Get medical help right away. Call your local emergency services (911 in the [place] Do not drive yourself to the hospital. Summary  Bruising and soreness at the IV site are common.  Check your insertion site every day for signs of infection.  Rest as told by your doctor. Go back to your normal activities as told by your doctor.  Get help right away if you have signs of a serious reaction. This information is not intended to replace advice given to you by your health care provider. Make sure you discuss any questions you have with your health care provider. Document Revised: 07/19/2018 Document Reviewed: 07/19/2018 Elsevier Patient Education  2020 [employer]  

## 2019-11-20 LAB — PREPARE PLATELET PHERESIS
Unit division: 0
Unit division: 0

## 2019-11-20 LAB — BPAM PLATELET PHERESIS
Blood Product Expiration Date: 202110122359
Blood Product Expiration Date: 202110122359
ISSUE DATE / TIME: 202110121128
ISSUE DATE / TIME: 202110121238
Unit Type and Rh: 6200
Unit Type and Rh: 6200

## 2019-11-20 LAB — TYPE AND SCREEN
ABO/RH(D): A POS
Antibody Screen: NEGATIVE
Unit division: 0

## 2019-11-20 LAB — BPAM RBC
Blood Product Expiration Date: 202111012359
ISSUE DATE / TIME: 202110121353
Unit Type and Rh: 6200

## 2019-12-03 ENCOUNTER — Inpatient Hospital Stay: Payer: Medicare Other

## 2019-12-03 ENCOUNTER — Telehealth: Payer: Self-pay | Admitting: *Deleted

## 2019-12-03 ENCOUNTER — Other Ambulatory Visit: Payer: Self-pay

## 2019-12-03 ENCOUNTER — Other Ambulatory Visit: Payer: Self-pay | Admitting: *Deleted

## 2019-12-03 DIAGNOSIS — D469 Myelodysplastic syndrome, unspecified: Secondary | ICD-10-CM

## 2019-12-03 DIAGNOSIS — D649 Anemia, unspecified: Secondary | ICD-10-CM

## 2019-12-03 LAB — CBC WITH DIFFERENTIAL (CANCER CENTER ONLY)
Abs Immature Granulocytes: 0.07 10*3/uL (ref 0.00–0.07)
Basophils Absolute: 0 10*3/uL (ref 0.0–0.1)
Basophils Relative: 0 %
Eosinophils Absolute: 0.1 10*3/uL (ref 0.0–0.5)
Eosinophils Relative: 1 %
HCT: 28.4 % — ABNORMAL LOW (ref 39.0–52.0)
Hemoglobin: 8.6 g/dL — ABNORMAL LOW (ref 13.0–17.0)
Immature Granulocytes: 2 %
Lymphocytes Relative: 23 %
Lymphs Abs: 0.9 10*3/uL (ref 0.7–4.0)
MCH: 29.7 pg (ref 26.0–34.0)
MCHC: 30.3 g/dL (ref 30.0–36.0)
MCV: 97.9 fL (ref 80.0–100.0)
Monocytes Absolute: 0.5 10*3/uL (ref 0.1–1.0)
Monocytes Relative: 12 %
Neutro Abs: 2.4 10*3/uL (ref 1.7–7.7)
Neutrophils Relative %: 62 %
Platelet Count: <5 10*3/uL — CL (ref 150–400)
RBC: 2.9 MIL/uL — ABNORMAL LOW (ref 4.22–5.81)
RDW: 20.2 % — ABNORMAL HIGH (ref 11.5–15.5)
WBC Count: 3.9 10*3/uL — ABNORMAL LOW (ref 4.0–10.5)
nRBC: 0 % (ref 0.0–0.2)

## 2019-12-03 LAB — SAMPLE TO BLOOD BANK

## 2019-12-03 MED ORDER — ACETAMINOPHEN 325 MG PO TABS
650.0000 mg | ORAL_TABLET | Freq: Once | ORAL | Status: AC
  Administered 2019-12-03: 650 mg via ORAL

## 2019-12-03 MED ORDER — SODIUM CHLORIDE 0.9% IV SOLUTION
250.0000 mL | Freq: Once | INTRAVENOUS | Status: AC
  Administered 2019-12-03: 250 mL via INTRAVENOUS
  Filled 2019-12-03: qty 250

## 2019-12-03 MED ORDER — METHYLPREDNISOLONE SODIUM SUCC 40 MG IJ SOLR
INTRAMUSCULAR | Status: AC
  Filled 2019-12-03: qty 1

## 2019-12-03 MED ORDER — ACETAMINOPHEN 325 MG PO TABS
ORAL_TABLET | ORAL | Status: AC
  Filled 2019-12-03: qty 2

## 2019-12-03 MED ORDER — METHYLPREDNISOLONE SODIUM SUCC 40 MG IJ SOLR
30.0000 mg | Freq: Once | INTRAMUSCULAR | Status: AC
  Administered 2019-12-03: 30 mg via INTRAVENOUS

## 2019-12-03 NOTE — Patient Instructions (Signed)
https://www.[facility].org/donate-blood/blood-donation-process/what-happens-to-donated-blood/blood-transfusions/types-of-[email or web address].html"> https://www.[facility].org/donate-blood/blood-donation-process/what-happens-to-donated-blood/blood-transfusions/risks-complications.html">  Blood Transfusion, Adult, Care After This sheet gives you information about how to care for yourself after your procedure. Your health care provider may also give you more specific instructions. If you have problems or questions, contact your health care provider. What can I expect after the procedure? After the procedure, it is common to have:  Bruising and soreness where the IV was inserted.  A fever or chills on the day of the procedure. This may be your body's response to the new blood cells received.  A headache. Follow these instructions at home: IV insertion site care      Follow instructions from your health care provider about how to take care of your IV insertion site. Make sure you: ? Wash your hands with soap and water before and after you change your bandage (dressing). If soap and water are not available, use hand sanitizer. ? Change your dressing as told by your health care provider.  Check your IV insertion site every day for signs of infection. Check for: ? Redness, swelling, or pain. ? Bleeding from the site. ? Warmth. ? Pus or a bad smell. General instructions  Take over-the-counter and prescription medicines only as told by your health care provider.  Rest as told by your health care provider.  Return to your normal activities as told by your health care provider.  Keep all follow-up visits as told by your health care provider. This is important. Contact a health care provider if:  You have itching or red, swollen areas of skin (hives).  You feel anxious.  You feel weak after doing your normal activities.  You have redness, swelling, warmth, or pain around the IV  insertion site.  You have blood coming from the IV insertion site that does not stop with pressure.  You have pus or a bad smell coming from your IV insertion site. Get help right away if:  You have symptoms of a serious allergic or immune system reaction, including: ? Trouble breathing or shortness of breath. ? Swelling of the face or feeling flushed. ? Fever or chills. ? Pain in the head, back, or chest. ? Dark urine or blood in the urine. ? Widespread rash. ? Fast heartbeat. ? Feeling dizzy or light-headed. If you receive your blood transfusion in an outpatient setting, you will be told whom to contact to report any reactions. These symptoms may represent a serious problem that is an emergency. Do not wait to see if the symptoms will go away. Get medical help right away. Call your local emergency services (911 in the [place] Do not drive yourself to the hospital. Summary  Bruising and tenderness around the IV insertion site are common.  Check your IV insertion site every day for signs of infection.  Rest as told by your health care provider. Return to your normal activities as told by your health care provider.  Get help right away for symptoms of a serious allergic or immune system reaction to blood transfusion. This information is not intended to replace advice given to you by your health care provider. Make sure you discuss any questions you have with your health care provider. Document Revised: 07/19/2018 Document Reviewed: 07/19/2018 Elsevier Patient Education  2020 Elsevier Inc.  

## 2019-12-03 NOTE — Telephone Encounter (Signed)
Received lab results: Plt <5.  Per Dr. Earlie Server (on call MD) transfuse 2 units platelets today. No PRBCs ordered.

## 2019-12-04 LAB — BPAM PLATELET PHERESIS
Blood Product Expiration Date: 202110282359
Blood Product Expiration Date: 202110282359
ISSUE DATE / TIME: 202110261228
ISSUE DATE / TIME: 202110261301
Unit Type and Rh: 5100
Unit Type and Rh: 6200

## 2019-12-04 LAB — PREPARE PLATELET PHERESIS
Unit division: 0
Unit division: 0

## 2019-12-11 ENCOUNTER — Telehealth: Payer: Self-pay | Admitting: Hematology

## 2019-12-11 NOTE — Telephone Encounter (Signed)
Scheduled per los, patient has been called regarding upcoming appointments. Left a voicemail. °

## 2019-12-17 ENCOUNTER — Other Ambulatory Visit: Payer: Self-pay

## 2019-12-17 ENCOUNTER — Inpatient Hospital Stay: Payer: Medicare Other

## 2019-12-17 ENCOUNTER — Telehealth: Payer: Self-pay

## 2019-12-17 ENCOUNTER — Inpatient Hospital Stay: Payer: Medicare Other | Attending: Hematology

## 2019-12-17 DIAGNOSIS — D469 Myelodysplastic syndrome, unspecified: Secondary | ICD-10-CM

## 2019-12-17 DIAGNOSIS — D649 Anemia, unspecified: Secondary | ICD-10-CM

## 2019-12-17 LAB — CBC WITH DIFFERENTIAL (CANCER CENTER ONLY)
Abs Immature Granulocytes: 0.01 10*3/uL (ref 0.00–0.07)
Basophils Absolute: 0 10*3/uL (ref 0.0–0.1)
Basophils Relative: 0 %
Eosinophils Absolute: 0 10*3/uL (ref 0.0–0.5)
Eosinophils Relative: 1 %
HCT: 25.8 % — ABNORMAL LOW (ref 39.0–52.0)
Hemoglobin: 8.1 g/dL — ABNORMAL LOW (ref 13.0–17.0)
Immature Granulocytes: 0 %
Lymphocytes Relative: 36 %
Lymphs Abs: 1.4 10*3/uL (ref 0.7–4.0)
MCH: 30.5 pg (ref 26.0–34.0)
MCHC: 31.4 g/dL (ref 30.0–36.0)
MCV: 97 fL (ref 80.0–100.0)
Monocytes Absolute: 0.4 10*3/uL (ref 0.1–1.0)
Monocytes Relative: 11 %
Neutro Abs: 1.9 10*3/uL (ref 1.7–7.7)
Neutrophils Relative %: 52 %
Platelet Count: 5 10*3/uL — CL (ref 150–400)
RBC: 2.66 MIL/uL — ABNORMAL LOW (ref 4.22–5.81)
RDW: 20.7 % — ABNORMAL HIGH (ref 11.5–15.5)
WBC Count: 3.8 10*3/uL — ABNORMAL LOW (ref 4.0–10.5)
nRBC: 0 % (ref 0.0–0.2)

## 2019-12-17 LAB — SAMPLE TO BLOOD BANK

## 2019-12-17 MED ORDER — ACETAMINOPHEN 325 MG PO TABS
ORAL_TABLET | ORAL | Status: AC
  Filled 2019-12-17: qty 2

## 2019-12-17 MED ORDER — METHYLPREDNISOLONE SODIUM SUCC 40 MG IJ SOLR
40.0000 mg | Freq: Once | INTRAMUSCULAR | Status: AC
  Administered 2019-12-17: 40 mg via INTRAVENOUS

## 2019-12-17 MED ORDER — METHYLPREDNISOLONE SODIUM SUCC 40 MG IJ SOLR
INTRAMUSCULAR | Status: AC
  Filled 2019-12-17: qty 1

## 2019-12-17 MED ORDER — SODIUM CHLORIDE 0.9% IV SOLUTION
250.0000 mL | Freq: Once | INTRAVENOUS | Status: AC
  Administered 2019-12-17: 250 mL via INTRAVENOUS
  Filled 2019-12-17: qty 250

## 2019-12-17 MED ORDER — ACETAMINOPHEN 325 MG PO TABS
650.0000 mg | ORAL_TABLET | Freq: Once | ORAL | Status: AC
  Administered 2019-12-17: 650 mg via ORAL

## 2019-12-17 NOTE — Patient Instructions (Signed)
Platelet Transfusion A platelet transfusion is a procedure in which you receive donated platelets through an IV. Platelets are tiny pieces of blood cells. When you get an injury, platelets clump together in the area to form a blood clot. This helps stop bleeding and is the beginning of the healing process. If you have too few platelets, your blood may have trouble clotting. This may cause you to bleed and bruise very easily. You may need a platelet transfusion if you have a condition that causes a low number of platelets (thrombocytopenia). A platelet transfusion may be used to stop or prevent excessive bleeding. Tell a health care provider about:  Any reactions you have had during previous transfusions.  Any allergies you have.  All medicines you are taking, including vitamins, herbs, eye drops, creams, and over-the-counter medicines.  Any blood disorders you have.  Any surgeries you have had.  Any medical conditions you have.  Whether you are pregnant or may be pregnant. What are the risks? Generally, this is a safe procedure. However, problems may occur, including:  Fever.  Infection.  Allergic reaction to the donor platelets.  Your body's disease-fighting system (immune system) attacking the donor platelets (hemolytic reaction). This is rare.  A rare reaction that causes lung damage (transfusion-related acute lung injury). What happens before the procedure? Medicines  Ask your health care provider about: ? Changing or stopping your regular medicines. This is especially important if you are taking diabetes medicines or blood thinners. ? Taking medicines such as aspirin and ibuprofen. These medicines can thin your blood. Do not take these medicines unless your health care provider tells you to take them. ? Taking over-the-counter medicines, vitamins, herbs, and supplements. General instructions  You will have a blood test to determine your blood type. Your blood type  determines what kind of platelets you will be given.  Follow instructions from your health care provider about eating or drinking restrictions.  If you have had an allergic reaction to a transfusion in the past, you may be given medicine to help prevent a reaction.  Your temperature, blood pressure, pulse, and breathing will be monitored. What happens during the procedure?   An IV will be inserted into one of your veins.  For your safety, two health care providers will verify your identity along with the donor platelets about to be infused.  A bag of donor platelets will be connected to your IV. The platelets will flow into your bloodstream. This usually takes 30-60 minutes.  Your temperature, blood pressure, pulse, and breathing will be monitored during the transfusion. This helps detect early signs of any reaction.  You will also be monitored for other symptoms that may indicate a reaction, including chills, hives, or itching.  If you have signs of a reaction at any time, your transfusion will be stopped, and you may be given medicine to help manage the reaction.  When your transfusion is complete, your IV will be removed.  Pressure may be applied to the IV site for a few minutes to stop any bleeding.  The IV site will be covered with a bandage (dressing). The procedure may vary among health care providers and hospitals. What happens after the procedure?  Your blood pressure, temperature, pulse, and breathing will be monitored until you leave the hospital or clinic.  You may have some bruising and soreness at your IV site. Follow these instructions at home: Medicines  Take over-the-counter and prescription medicines only as told by your health care provider.    Talk with your health care provider before you take any medicines that contain aspirin or NSAIDs. These medicines increase your risk for dangerous bleeding. General instructions  Change or remove your dressing as told  by your health care provider.  Return to your normal activities as told by your health care provider. Ask your health care provider what activities are safe for you.  Do not take baths, swim, or use a hot tub until your health care provider approves. Ask your health care provider if you may take showers.  Check your IV site every day for signs of infection. Check for: ? Redness, swelling, or pain. ? Fluid or blood. If fluid or blood drains from your IV site, use your hands to press down firmly on a bandage covering the area for a minute or two. Doing this should stop the bleeding. ? Warmth. ? Pus or a bad smell.  Keep all follow-up visits as told by your health care provider. This is important. Contact a health care provider if you have:  A headache that does not go away with medicine.  Hives, rash, or itchy skin.  Nausea or vomiting.  Unusual tiredness or weakness.  Signs of infection at your IV site. Get help right away if:  You have a fever or chills.  You urinate less often than usual.  Your urine is darker colored than normal.  You have any of the following: ? Trouble breathing. ? Pain in your back, abdomen, or chest. ? Cool, clammy skin. ? A fast heartbeat. Summary  Platelets are tiny pieces of blood cells that clump together to form a blood clot when you have an injury. If you have too few platelets, your blood may have trouble clotting.  A platelet transfusion is a procedure in which you receive donated platelets through an IV.  A platelet transfusion may be used to stop or prevent excessive bleeding.  After the procedure, check your IV site every day for signs of infection, including redness, swelling, pain, or warmth. This information is not intended to replace advice given to you by your health care provider. Make sure you discuss any questions you have with your health care provider. Document Revised: 03/01/2017 Document Reviewed: 03/01/2017 Elsevier  Patient Education  2020 [employer]   Blood Transfusion, Adult, Care After This sheet gives you information about how to care for yourself after your procedure. Your doctor may also give you more specific instructions. If you have problems or questions, contact your doctor. What can I expect after the procedure? After the procedure, it is common to have:  Bruising and soreness at the IV site.  A fever or chills on the day of the procedure. This may be your body's response to the new blood cells received.  A headache. Follow these instructions at home: Insertion site care      Follow instructions from your doctor about how to take care of your insertion site. This is where an IV tube was put into your vein. Make sure you: ? Wash your hands with soap and water before and after you change your bandage (dressing). If you cannot use soap and water, use hand sanitizer. ? Change your bandage as told by your doctor.  Check your insertion site every day for signs of infection. Check for: ? Redness, swelling, or pain. ? Bleeding from the site. ? Warmth. ? Pus or a bad smell. General instructions  Take over-the-counter and prescription medicines only as told by your doctor.  Rest   as told by your doctor.  Go back to your normal activities as told by your doctor.  Keep all follow-up visits as told by your doctor. This is important. Contact a doctor if:  You have itching or red, swollen areas of skin (hives).  You feel worried or nervous (anxious).  You feel weak after doing your normal activities.  You have redness, swelling, warmth, or pain around the insertion site.  You have blood coming from the insertion site, and the blood does not stop with pressure.  You have pus or a bad smell coming from the insertion site. Get help right away if:  You have signs of a serious reaction. This may be coming from an allergy or the body's defense system (immune system). Signs  include: ? Trouble breathing or shortness of breath. ? Swelling of the face or feeling warm (flushed). ? Fever or chills. ? Head, chest, or back pain. ? Dark pee (urine) or blood in the pee. ? Widespread rash. ? Fast heartbeat. ? Feeling dizzy or light-headed. You may receive your blood transfusion in an outpatient setting. If so, you will be told whom to contact to report any reactions. These symptoms may be an emergency. Do not wait to see if the symptoms will go away. Get medical help right away. Call your local emergency services (911 in the [place] Do not drive yourself to the hospital. Summary  Bruising and soreness at the IV site are common.  Check your insertion site every day for signs of infection.  Rest as told by your doctor. Go back to your normal activities as told by your doctor.  Get help right away if you have signs of a serious reaction. This information is not intended to replace advice given to you by your health care provider. Make sure you discuss any questions you have with your health care provider. Document Revised: 07/19/2018 Document Reviewed: 07/19/2018 Elsevier Patient Education  2020 [employer]  

## 2019-12-17 NOTE — Telephone Encounter (Signed)
CRITICAL VALUE STICKER  CRITICAL VALUE: Platelets 5  RECEIVER (on-site recipient of call): Kim LPN  [place] NOTIFIED:  12/17/19 1110 MESSENGER (representative from lab): Ulice Dash  MD NOTIFIED:  Dr Irene Limbo  TIME OF NOTIFICATION: 1115 RESPONSE:  Patient to receive two units of platelets. Orders are in

## 2019-12-18 LAB — BPAM PLATELET PHERESIS
Blood Product Expiration Date: 202111102359
Blood Product Expiration Date: 202111102359
ISSUE DATE / TIME: 202111091212
ISSUE DATE / TIME: 202111091317
Unit Type and Rh: 6200
Unit Type and Rh: 6200

## 2019-12-18 LAB — PREPARE PLATELET PHERESIS
Unit division: 0
Unit division: 0

## 2019-12-23 ENCOUNTER — Other Ambulatory Visit: Payer: Self-pay | Admitting: *Deleted

## 2019-12-23 DIAGNOSIS — D469 Myelodysplastic syndrome, unspecified: Secondary | ICD-10-CM

## 2019-12-23 MED ORDER — TRANEXAMIC ACID 650 MG PO TABS: 650.0000 mg | ORAL_TABLET | Freq: Two times a day (BID) | ORAL | 0 refills | Status: DC

## 2019-12-23 NOTE — Telephone Encounter (Signed)
Wife requested 90 day refill of [facility]eda [facility]. 90 day Rx auth'd by Dr. Irene Limbo. Rx [facility] to [facility]

## 2019-12-31 ENCOUNTER — Other Ambulatory Visit: Payer: Self-pay

## 2019-12-31 ENCOUNTER — Inpatient Hospital Stay: Payer: Medicare Other

## 2019-12-31 ENCOUNTER — Telehealth: Payer: Self-pay | Admitting: *Deleted

## 2019-12-31 ENCOUNTER — Other Ambulatory Visit: Payer: Self-pay | Admitting: *Deleted

## 2019-12-31 DIAGNOSIS — D649 Anemia, unspecified: Secondary | ICD-10-CM

## 2019-12-31 DIAGNOSIS — D469 Myelodysplastic syndrome, unspecified: Secondary | ICD-10-CM | POA: Diagnosis not present

## 2019-12-31 LAB — CBC WITH DIFFERENTIAL (CANCER CENTER ONLY)
Abs Immature Granulocytes: 0 10*3/uL (ref 0.00–0.07)
Basophils Absolute: 0 10*3/uL (ref 0.0–0.1)
Basophils Relative: 0 %
Eosinophils Absolute: 0 10*3/uL (ref 0.0–0.5)
Eosinophils Relative: 1 %
HCT: 26.2 % — ABNORMAL LOW (ref 39.0–52.0)
Hemoglobin: 7.9 g/dL — ABNORMAL LOW (ref 13.0–17.0)
Immature Granulocytes: 0 %
Lymphocytes Relative: 30 %
Lymphs Abs: 1 10*3/uL (ref 0.7–4.0)
MCH: 30.3 pg (ref 26.0–34.0)
MCHC: 30.2 g/dL (ref 30.0–36.0)
MCV: 100.4 fL — ABNORMAL HIGH (ref 80.0–100.0)
Monocytes Absolute: 0.4 10*3/uL (ref 0.1–1.0)
Monocytes Relative: 13 %
Neutro Abs: 1.9 10*3/uL (ref 1.7–7.7)
Neutrophils Relative %: 56 %
Platelet Count: 5 10*3/uL — CL (ref 150–400)
RBC: 2.61 MIL/uL — ABNORMAL LOW (ref 4.22–5.81)
RDW: 21.5 % — ABNORMAL HIGH (ref 11.5–15.5)
Smear Review: DECREASED
WBC Count: 3.3 10*3/uL — ABNORMAL LOW (ref 4.0–10.5)
nRBC: 0 % (ref 0.0–0.2)

## 2019-12-31 LAB — SAMPLE TO BLOOD BANK

## 2019-12-31 LAB — PREPARE RBC (CROSSMATCH)

## 2019-12-31 MED ORDER — METHYLPREDNISOLONE SODIUM SUCC 40 MG IJ SOLR
40.0000 mg | Freq: Once | INTRAMUSCULAR | Status: AC
  Administered 2019-12-31: 40 mg via INTRAVENOUS

## 2019-12-31 MED ORDER — ACETAMINOPHEN 325 MG PO TABS
650.0000 mg | ORAL_TABLET | Freq: Once | ORAL | Status: AC
  Administered 2019-12-31: 650 mg via ORAL

## 2019-12-31 MED ORDER — METHYLPREDNISOLONE SODIUM SUCC 40 MG IJ SOLR
INTRAMUSCULAR | Status: AC
  Filled 2019-12-31: qty 1

## 2019-12-31 MED ORDER — SODIUM CHLORIDE 0.9% IV SOLUTION
250.0000 mL | Freq: Once | INTRAVENOUS | Status: AC
  Administered 2019-12-31: 250 mL via INTRAVENOUS
  Filled 2019-12-31: qty 250

## 2019-12-31 MED ORDER — ACETAMINOPHEN 325 MG PO TABS
ORAL_TABLET | ORAL | Status: AC
  Filled 2019-12-31: qty 2

## 2019-12-31 NOTE — Progress Notes (Signed)
Pt discharged in no apparent distress. Pt left ambulatory without assistance. Pt aware of discharge instructions and verbalized understanding and had no further questions.  

## 2019-12-31 NOTE — Patient Instructions (Signed)

## 2019-12-31 NOTE — Telephone Encounter (Signed)
CRITICAL VALUE STICKER  CRITICAL VALUE:PLT 5; Hgb 7.9  RECEIVER (on-site recipient of call):Sandi K, [place] NOTIFIED:12/31/19; 1045  MESSENGER (representative from lab): Hillary  MD NOTIFIED: Dr.Kale  TIME OF NOTIFICATION: 8937  RESPONSE: Orders for transfusions received

## 2020-01-01 LAB — TYPE AND SCREEN
ABO/RH(D): A POS
Antibody Screen: POSITIVE
Donor AG Type: NEGATIVE
Unit division: 0

## 2020-01-01 LAB — BPAM PLATELET PHERESIS
Blood Product Expiration Date: 202111242359
Blood Product Expiration Date: 202111252359
ISSUE DATE / TIME: 202111231148
ISSUE DATE / TIME: 202111231257
Unit Type and Rh: 6200
Unit Type and Rh: 6200

## 2020-01-01 LAB — PREPARE PLATELET PHERESIS
Unit division: 0
Unit division: 0

## 2020-01-01 LAB — BPAM RBC
Blood Product Expiration Date: 202112012359
ISSUE DATE / TIME: 202111231432
Unit Type and Rh: 600

## 2020-01-14 ENCOUNTER — Inpatient Hospital Stay: Payer: Medicare Other

## 2020-01-14 ENCOUNTER — Inpatient Hospital Stay: Payer: Medicare Other | Attending: Hematology

## 2020-01-14 ENCOUNTER — Other Ambulatory Visit: Payer: Self-pay | Admitting: *Deleted

## 2020-01-14 ENCOUNTER — Other Ambulatory Visit: Payer: Self-pay

## 2020-01-14 DIAGNOSIS — D469 Myelodysplastic syndrome, unspecified: Secondary | ICD-10-CM | POA: Insufficient documentation

## 2020-01-14 DIAGNOSIS — D649 Anemia, unspecified: Secondary | ICD-10-CM

## 2020-01-14 LAB — CBC WITH DIFFERENTIAL (CANCER CENTER ONLY)
Abs Immature Granulocytes: 0 10*3/uL (ref 0.00–0.07)
Basophils Absolute: 0 10*3/uL (ref 0.0–0.1)
Basophils Relative: 0 %
Eosinophils Absolute: 0 10*3/uL (ref 0.0–0.5)
Eosinophils Relative: 1 %
HCT: 28.4 % — ABNORMAL LOW (ref 39.0–52.0)
Hemoglobin: 8.7 g/dL — ABNORMAL LOW (ref 13.0–17.0)
Immature Granulocytes: 0 %
Lymphocytes Relative: 30 %
Lymphs Abs: 0.8 10*3/uL (ref 0.7–4.0)
MCH: 30.2 pg (ref 26.0–34.0)
MCHC: 30.6 g/dL (ref 30.0–36.0)
MCV: 98.6 fL (ref 80.0–100.0)
Monocytes Absolute: 0.2 10*3/uL (ref 0.1–1.0)
Monocytes Relative: 8 %
Neutro Abs: 1.6 10*3/uL — ABNORMAL LOW (ref 1.7–7.7)
Neutrophils Relative %: 61 %
Platelet Count: <5 10*3/uL — CL (ref 150–400)
RBC: 2.88 MIL/uL — ABNORMAL LOW (ref 4.22–5.81)
RDW: 19.9 % — ABNORMAL HIGH (ref 11.5–15.5)
WBC Count: 2.6 10*3/uL — ABNORMAL LOW (ref 4.0–10.5)
nRBC: 0 % (ref 0.0–0.2)

## 2020-01-14 MED ORDER — SODIUM CHLORIDE 0.9 % IV SOLN
Freq: Once | INTRAVENOUS | Status: DC
  Filled 2020-01-14: qty 250

## 2020-01-14 MED ORDER — METHYLPREDNISOLONE SODIUM SUCC 40 MG IJ SOLR
INTRAMUSCULAR | Status: AC
  Filled 2020-01-14: qty 1

## 2020-01-14 MED ORDER — SODIUM CHLORIDE 0.9% IV SOLUTION
250.0000 mL | Freq: Once | INTRAVENOUS | Status: AC
  Administered 2020-01-14: 250 mL via INTRAVENOUS
  Filled 2020-01-14: qty 250

## 2020-01-14 MED ORDER — ACETAMINOPHEN 325 MG PO TABS
650.0000 mg | ORAL_TABLET | Freq: Once | ORAL | Status: AC
  Administered 2020-01-14: 650 mg via ORAL

## 2020-01-14 MED ORDER — METHYLPREDNISOLONE SODIUM SUCC 40 MG IJ SOLR
40.0000 mg | Freq: Once | INTRAMUSCULAR | Status: AC
  Administered 2020-01-14: 40 mg via INTRAVENOUS

## 2020-01-14 MED ORDER — ACETAMINOPHEN 325 MG PO TABS
ORAL_TABLET | ORAL | Status: AC
  Filled 2020-01-14: qty 2

## 2020-01-14 NOTE — Patient Instructions (Signed)
Platelet Transfusion A platelet transfusion is a procedure in which you receive donated platelets through an IV. Platelets are tiny pieces of blood cells. When you get an injury, platelets clump together in the area to form a blood clot. This helps stop bleeding and is the beginning of the healing process. If you have too few platelets, your blood may have trouble clotting. This may cause you to bleed and bruise very easily. You may need a platelet transfusion if you have a condition that causes a low number of platelets (thrombocytopenia). A platelet transfusion may be used to stop or prevent excessive bleeding. Tell a health care provider about:  Any reactions you have had during previous transfusions.  Any allergies you have.  All medicines you are taking, including vitamins, herbs, eye drops, creams, and over-the-counter medicines.  Any blood disorders you have.  Any surgeries you have had.  Any medical conditions you have.  Whether you are pregnant or may be pregnant. What are the risks? Generally, this is a safe procedure. However, problems may occur, including:  Fever.  Infection.  Allergic reaction to the donor platelets.  Your body's disease-fighting system (immune system) attacking the donor platelets (hemolytic reaction). This is rare.  A rare reaction that causes lung damage (transfusion-related acute lung injury). What happens before the procedure? Medicines  Ask your health care provider about: ? Changing or stopping your regular medicines. This is especially important if you are taking diabetes medicines or blood thinners. ? Taking medicines such as aspirin and ibuprofen. These medicines can thin your blood. Do not take these medicines unless your health care provider tells you to take them. ? Taking over-the-counter medicines, vitamins, herbs, and supplements. General instructions  You will have a blood test to determine your blood type. Your blood type  determines what kind of platelets you will be given.  Follow instructions from your health care provider about eating or drinking restrictions.  If you have had an allergic reaction to a transfusion in the past, you may be given medicine to help prevent a reaction.  Your temperature, blood pressure, pulse, and breathing will be monitored. What happens during the procedure?   An IV will be inserted into one of your veins.  For your safety, two health care providers will verify your identity along with the donor platelets about to be infused.  A bag of donor platelets will be connected to your IV. The platelets will flow into your bloodstream. This usually takes 30-60 minutes.  Your temperature, blood pressure, pulse, and breathing will be monitored during the transfusion. This helps detect early signs of any reaction.  You will also be monitored for other symptoms that may indicate a reaction, including chills, hives, or itching.  If you have signs of a reaction at any time, your transfusion will be stopped, and you may be given medicine to help manage the reaction.  When your transfusion is complete, your IV will be removed.  Pressure may be applied to the IV site for a few minutes to stop any bleeding.  The IV site will be covered with a bandage (dressing). The procedure may vary among health care providers and hospitals. What happens after the procedure?  Your blood pressure, temperature, pulse, and breathing will be monitored until you leave the hospital or clinic.  You may have some bruising and soreness at your IV site. Follow these instructions at home: Medicines  Take over-the-counter and prescription medicines only as told by your health care provider.    Talk with your health care provider before you take any medicines that contain aspirin or NSAIDs. These medicines increase your risk for dangerous bleeding. General instructions  Change or remove your dressing as told  by your health care provider.  Return to your normal activities as told by your health care provider. Ask your health care provider what activities are safe for you.  Do not take baths, swim, or use a hot tub until your health care provider approves. Ask your health care provider if you may take showers.  Check your IV site every day for signs of infection. Check for: ? Redness, swelling, or pain. ? Fluid or blood. If fluid or blood drains from your IV site, use your hands to press down firmly on a bandage covering the area for a minute or two. Doing this should stop the bleeding. ? Warmth. ? Pus or a bad smell.  Keep all follow-up visits as told by your health care provider. This is important. Contact a health care provider if you have:  A headache that does not go away with medicine.  Hives, rash, or itchy skin.  Nausea or vomiting.  Unusual tiredness or weakness.  Signs of infection at your IV site. Get help right away if:  You have a fever or chills.  You urinate less often than usual.  Your urine is darker colored than normal.  You have any of the following: ? Trouble breathing. ? Pain in your back, abdomen, or chest. ? Cool, clammy skin. ? A fast heartbeat. Summary  Platelets are tiny pieces of blood cells that clump together to form a blood clot when you have an injury. If you have too few platelets, your blood may have trouble clotting.  A platelet transfusion is a procedure in which you receive donated platelets through an IV.  A platelet transfusion may be used to stop or prevent excessive bleeding.  After the procedure, check your IV site every day for signs of infection, including redness, swelling, pain, or warmth. This information is not intended to replace advice given to you by your health care provider. Make sure you discuss any questions you have with your health care provider. Document Revised: 03/01/2017 Document Reviewed: 03/01/2017 Elsevier  Patient Education  2020 [employer]   Blood Transfusion, Adult, Care After This sheet gives you information about how to care for yourself after your procedure. Your doctor may also give you more specific instructions. If you have problems or questions, contact your doctor. What can I expect after the procedure? After the procedure, it is common to have:  Bruising and soreness at the IV site.  A fever or chills on the day of the procedure. This may be your body's response to the new blood cells received.  A headache. Follow these instructions at home: Insertion site care      Follow instructions from your doctor about how to take care of your insertion site. This is where an IV tube was put into your vein. Make sure you: ? Wash your hands with soap and water before and after you change your bandage (dressing). If you cannot use soap and water, use hand sanitizer. ? Change your bandage as told by your doctor.  Check your insertion site every day for signs of infection. Check for: ? Redness, swelling, or pain. ? Bleeding from the site. ? Warmth. ? Pus or a bad smell. General instructions  Take over-the-counter and prescription medicines only as told by your doctor.  Rest   as told by your doctor.  Go back to your normal activities as told by your doctor.  Keep all follow-up visits as told by your doctor. This is important. Contact a doctor if:  You have itching or red, swollen areas of skin (hives).  You feel worried or nervous (anxious).  You feel weak after doing your normal activities.  You have redness, swelling, warmth, or pain around the insertion site.  You have blood coming from the insertion site, and the blood does not stop with pressure.  You have pus or a bad smell coming from the insertion site. Get help right away if:  You have signs of a serious reaction. This may be coming from an allergy or the body's defense system (immune system). Signs  include: ? Trouble breathing or shortness of breath. ? Swelling of the face or feeling warm (flushed). ? Fever or chills. ? Head, chest, or back pain. ? Dark pee (urine) or blood in the pee. ? Widespread rash. ? Fast heartbeat. ? Feeling dizzy or light-headed. You may receive your blood transfusion in an outpatient setting. If so, you will be told whom to contact to report any reactions. These symptoms may be an emergency. Do not wait to see if the symptoms will go away. Get medical help right away. Call your local emergency services (911 in the [place] Do not drive yourself to the hospital. Summary  Bruising and soreness at the IV site are common.  Check your insertion site every day for signs of infection.  Rest as told by your doctor. Go back to your normal activities as told by your doctor.  Get help right away if you have signs of a serious reaction. This information is not intended to replace advice given to you by your health care provider. Make sure you discuss any questions you have with your health care provider. Document Revised: 07/19/2018 Document Reviewed: 07/19/2018 Elsevier Patient Education  2020 [employer]  

## 2020-01-15 LAB — PREPARE PLATELET PHERESIS
Unit division: 0
Unit division: 0

## 2020-01-15 LAB — BPAM PLATELET PHERESIS
Blood Product Expiration Date: 202112072359
Blood Product Expiration Date: 202112092359
ISSUE DATE / TIME: 202112071049
ISSUE DATE / TIME: 202112071154
Unit Type and Rh: 6200
Unit Type and Rh: 7300

## 2020-01-25 IMAGING — DX DG CHEST 1V PORT
1 series · 1 of 1 positions shown · non-contrast
Comparison: None.

CLINICAL DATA: Headache, fever, [id number]

EXAM:
PORTABLE CHEST 1 VIEW

[chest ap]
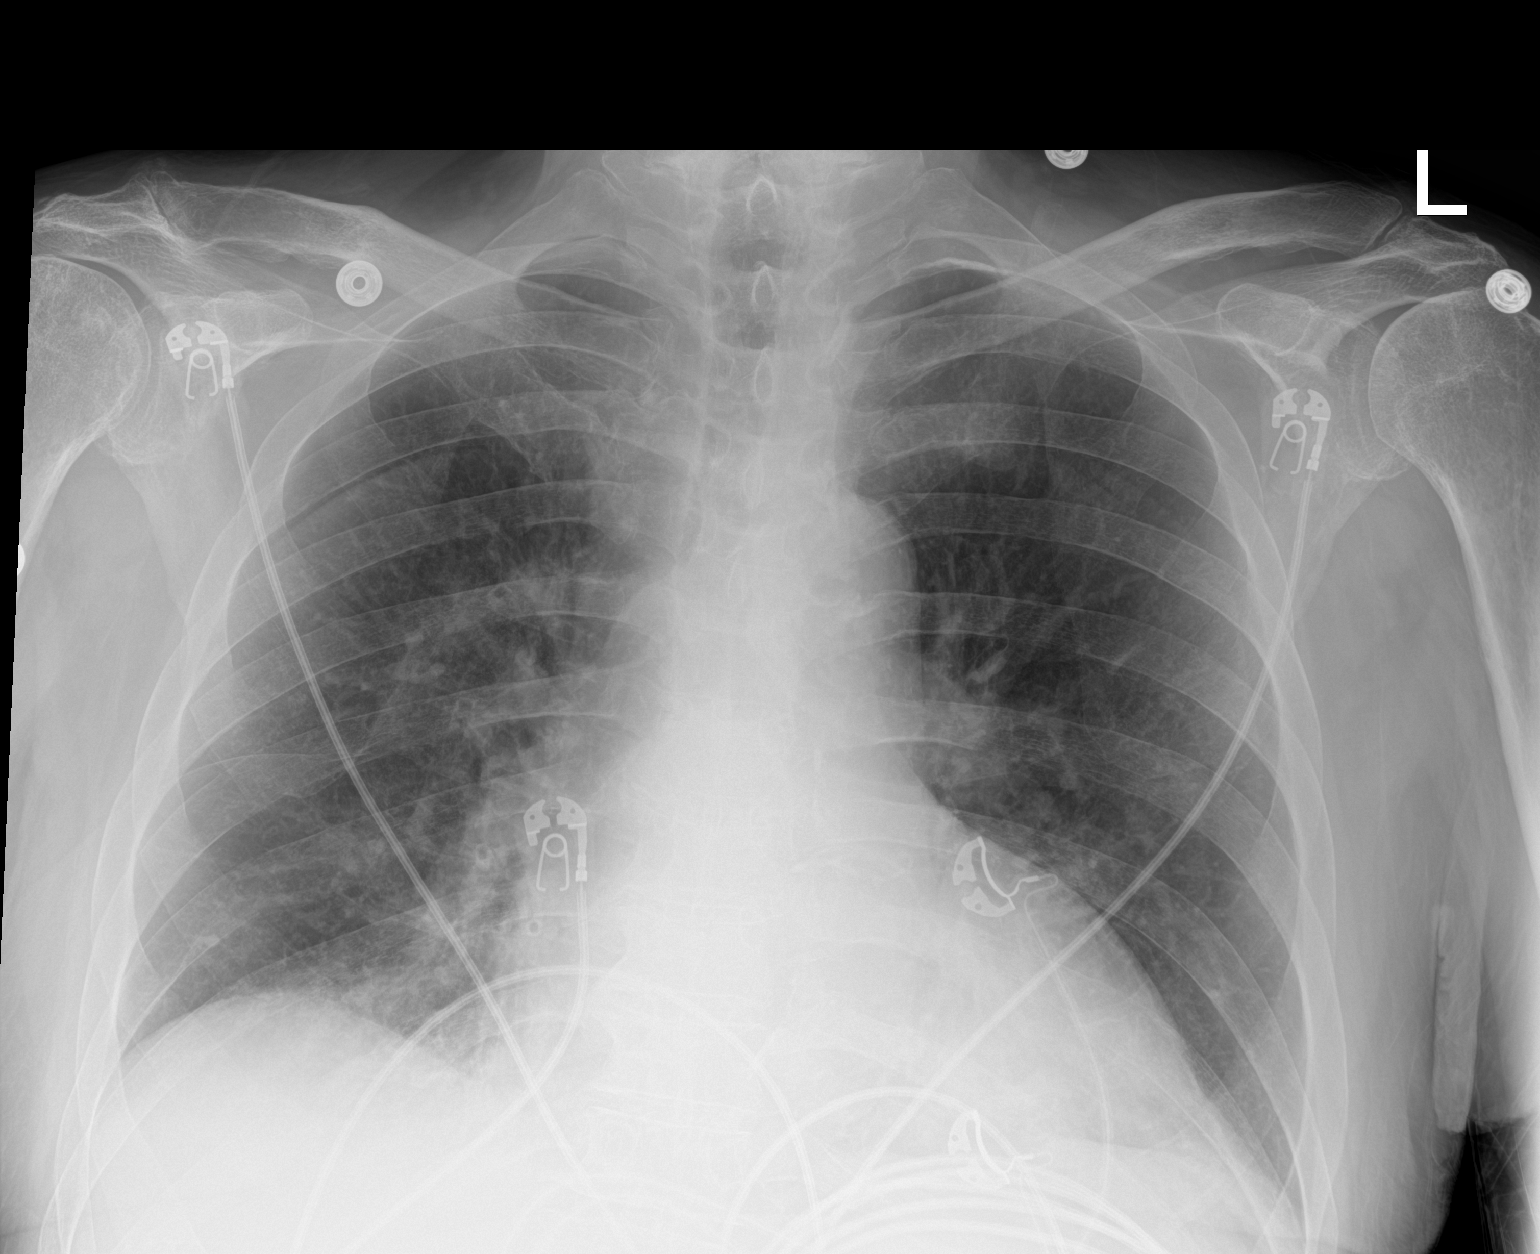

[1 of 1 positions shown; findings below may reference images not displayed]

FINDINGS: Patchy right lower lobe opacity, suspicious for pneumonia. Mild left
basilar opacity, possibly atelectasis. No pleural effusion or
pneumothorax.

Cardiomegaly.
IMPRESSION: Right lower lobe pneumonia in this patient with known COVID.

## 2020-01-28 ENCOUNTER — Other Ambulatory Visit: Payer: Self-pay | Admitting: *Deleted

## 2020-01-28 ENCOUNTER — Telehealth: Payer: Self-pay | Admitting: *Deleted

## 2020-01-28 ENCOUNTER — Other Ambulatory Visit: Payer: Self-pay

## 2020-01-28 ENCOUNTER — Inpatient Hospital Stay: Payer: Medicare Other

## 2020-01-28 DIAGNOSIS — D649 Anemia, unspecified: Secondary | ICD-10-CM

## 2020-01-28 DIAGNOSIS — D696 Thrombocytopenia, unspecified: Secondary | ICD-10-CM

## 2020-01-28 DIAGNOSIS — D469 Myelodysplastic syndrome, unspecified: Secondary | ICD-10-CM | POA: Diagnosis not present

## 2020-01-28 LAB — CBC WITH DIFFERENTIAL (CANCER CENTER ONLY)
Abs Immature Granulocytes: 0.03 10*3/uL (ref 0.00–0.07)
Basophils Absolute: 0 10*3/uL (ref 0.0–0.1)
Basophils Relative: 0 %
Eosinophils Absolute: 0 10*3/uL (ref 0.0–0.5)
Eosinophils Relative: 1 %
HCT: 26.9 % — ABNORMAL LOW (ref 39.0–52.0)
Hemoglobin: 8.2 g/dL — ABNORMAL LOW (ref 13.0–17.0)
Immature Granulocytes: 1 %
Lymphocytes Relative: 24 %
Lymphs Abs: 0.9 10*3/uL (ref 0.7–4.0)
MCH: 30.6 pg (ref 26.0–34.0)
MCHC: 30.5 g/dL (ref 30.0–36.0)
MCV: 100.4 fL — ABNORMAL HIGH (ref 80.0–100.0)
Monocytes Absolute: 0.3 10*3/uL (ref 0.1–1.0)
Monocytes Relative: 7 %
Neutro Abs: 2.6 10*3/uL (ref 1.7–7.7)
Neutrophils Relative %: 67 %
Platelet Count: 6 10*3/uL — CL (ref 150–400)
RBC: 2.68 MIL/uL — ABNORMAL LOW (ref 4.22–5.81)
RDW: 20.4 % — ABNORMAL HIGH (ref 11.5–15.5)
WBC Count: 3.8 10*3/uL — ABNORMAL LOW (ref 4.0–10.5)
nRBC: 0 % (ref 0.0–0.2)

## 2020-01-28 LAB — SAMPLE TO BLOOD BANK

## 2020-01-28 MED ORDER — SODIUM CHLORIDE 0.9% IV SOLUTION
250.0000 mL | Freq: Once | INTRAVENOUS | Status: AC
  Administered 2020-01-28: 11:00:00 250 mL via INTRAVENOUS
  Filled 2020-01-28: qty 250

## 2020-01-28 MED ORDER — METHYLPREDNISOLONE SODIUM SUCC 40 MG IJ SOLR
INTRAMUSCULAR | Status: AC
  Filled 2020-01-28: qty 1

## 2020-01-28 MED ORDER — ACETAMINOPHEN 325 MG PO TABS
650.0000 mg | ORAL_TABLET | Freq: Once | ORAL | Status: AC
  Administered 2020-01-28: 11:00:00 650 mg via ORAL

## 2020-01-28 MED ORDER — ACETAMINOPHEN 325 MG PO TABS
ORAL_TABLET | ORAL | Status: AC
  Filled 2020-01-28: qty 2

## 2020-01-28 MED ORDER — METHYLPREDNISOLONE SODIUM SUCC 40 MG IJ SOLR
40.0000 mg | Freq: Once | INTRAMUSCULAR | Status: AC
  Administered 2020-01-28: 11:00:00 40 mg via INTRAVENOUS

## 2020-01-28 MED ORDER — SODIUM CHLORIDE 0.9% FLUSH
10.0000 mL | INTRAVENOUS | Status: DC | PRN
  Filled 2020-01-28: qty 10

## 2020-01-28 NOTE — Telephone Encounter (Signed)
PLT 6 Hgb 8.2. Provider made aware. Pt will receive 2 units of PLT

## 2020-01-28 NOTE — Patient Instructions (Signed)
Platelet Count Test Why am I having this test? Platelets are specialized cells that help the blood clot. When you get a tissue injury like a cut, platelets gather at the site of the injury to stop the bleeding. You may have a platelet count test:  If you have symptoms that may be related to excess bleeding or delayed blood clotting, such as: ? A rash of pinprick-sized red and purple dots on the skin (petechiae). These are small collections of blood (hemorrhages) in the skin. ? Heavy menstrual bleeding.  To help monitor treatment for: ? Thrombocytopenia. This is a condition in which you have a low platelet count. ? Bone marrow failure. What is being tested? This test measures how many platelets you have within a specific amount (volume) of blood. What kind of sample is taken?  A blood sample is required for this test. It is usually collected by inserting a needle into a blood vessel or by sticking a finger with a small needle. Tell a health care provider about:  Any allergies you have.  All medicines you are taking, including vitamins, herbs, eye drops, creams, and over-the-counter medicines.  Any blood disorders you have.  Any surgeries you have had.  Any medical conditions you have.  Whether you are pregnant or may be pregnant. How are the results reported? Your test results will be reported as a value that indicates how many platelets are in the blood volume. This will be given as platelets per cubic millimeter (mm3) of blood. Your health care provider will compare your results to normal ranges that were established after testing a large group of people (reference ranges). Reference ranges may vary among labs and hospitals. For this test, common reference ranges are:  Adult or elderly: 150,000-400,000/mm3.  Child: 150,000-400,000/mm3.  Infant: 200,000-475,000/mm3.  Premature infant: 100,000-300,000/mm3.  Newborn: 150,000-300,000/mm3. What do the results mean? A result  that is within your reference range is considered normal, meaning that you have a normal amount of platelets in your blood. A result that is higher than your reference range means that you have too many platelets in your blood. This may mean that you have:  Certain types of cancer, such as leukemia or lymphoma.  A condition in which the bone marrow produces excess amounts of all cell types, including platelets (polycythemia vera).  A condition that can occur after surgery to remove the spleen (post-splenectomy syndrome).  Rheumatoid arthritis.  Anemia due to lack of iron (iron-deficiency anemia). A result that is lower than your reference range means that you have too few platelets in your blood. This may mean that you have:  A condition in which the spleen breaks down platelets faster than normal (hypersplenism).  A hemorrhage somewhere in your body.  Low platelet count due to your body's disease-fighting system attacking your platelets (immune thrombocytopenia).  Cancer. Chemotherapy treatments for cancers such as leukemia can also cause low platelet count.  A rare, serious form of thrombocytopenia that causes blood clots (thrombotic thrombocytopenia).  HELLP syndrome, a disorder of pregnancy that causes high blood pressure and other serious problems.  Certain disorders that are passed from parent to child (inherited) that cause a low platelet count.  A condition in which the proteins that control blood clotting are overactive, causing abnormal clotting processes to occur (disseminated intravascular coagulation, DIC).  A disease that causes long-term inflammation and pain in many parts of the body (systemic lupus erythematosus, SLE).  Certain types of anemia, such as pernicious anemia or hemolytic anemia.    Infection. Talk with your health care provider about what your results mean. Questions to ask your health care provider Ask your health care provider, or the department that  is doing the test:  When will my results be ready?  How will I get my results?  What are my treatment options?  What other tests do I need?  What are my next steps? Summary  Platelets are specialized cells that help the blood clot. When you get a tissue injury like a cut, platelets gather at the site of the injury to stop the bleeding.  This test measures how many platelets you have within a specific amount (volume) of blood.  Talk with your health care provider about what your results mean. This information is not intended to replace advice given to you by your health care provider. Make sure you discuss any questions you have with your health care provider. Document Revised: 10/17/2016 Document Reviewed: 10/17/2016 Elsevier Patient Education  2020 Elsevier Inc.  

## 2020-01-29 LAB — PREPARE PLATELET PHERESIS
Unit division: 0
Unit division: 0

## 2020-01-29 LAB — BPAM PLATELET PHERESIS
Blood Product Expiration Date: 202112232359
Blood Product Expiration Date: 202112242359
ISSUE DATE / TIME: 202112211149
ISSUE DATE / TIME: 202112211242
Unit Type and Rh: 5100
Unit Type and Rh: 6200

## 2020-02-06 ENCOUNTER — Telehealth: Payer: Self-pay | Admitting: Hematology

## 2020-02-06 NOTE — Telephone Encounter (Signed)
Called patient regarding upcoming appointments, spoke with patient's wife. Patient will be notified of upcoming appointments. 

## 2020-02-10 NOTE — Progress Notes (Signed)
HEMATOLOGY/ONCOLOGY CLINIC NOTE  Date of Service: 02/11/2020  Patient Care Team: Chesley Noon, MD as PCP - General (Family Medicine)  CHIEF COMPLAINTS/PURPOSE OF CONSULTATION:  Myelodysplastic Syndrome  Oncologic History:   Shaun Hobbs initially presented to care with pancytopenia and was evaluated with a BM Bx on 05/16/06 which did not reveal evidence of abnormal myeloid maturation or other abnormalities, and was without chromosomal abnormality as well. A BM Bx was repeated on 08/28/07 which did reveal dyserythropoiesis and some dysplasia. A 04/17/12 Bm biopsy revealed mild dysplasia, hypercellular marrow with 1.5% blasts, and a minute CD5 positive monoclonal B-cell population detected by flow. 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 His most recent 12/27/17 Bm Bx revealed 30% cellularity with trilineage hematopoiesis, mild megakaryocytic hyperplasia with dyspoietic changes, mild erythroid hyperplasia with ringed sideroblasts, and a minute population of monoclonal B-cells detected by flow.   The pt started 5m Revlimid for 3 months, beginning 09/03/10 "without noticeable improvement." He later began Vidaza, and completed his 21st cycle the week of November 30, 2017.  HISTORY OF PRESENTING ILLNESS:   Shaun Hobbs a wonderful 87y.o. male who [place] [facility] [place] [facility] [place] Myelodysplastic Syndrome. He is accompanied today by his wife and daughter. The pt reports that he is doing well overall.  The pt's wife reports that the first thing that occurred related to the patient's MDS was that his PLT were seen to be reduced to 99k, 12 years ago. The pt was evaluated with repeat BM biopsies over the last 12 years, and began Revlimid  for only 3 months in 2012. The pt's wife notes that he began Vidaza a little less than two years ago, after his PLT dropped to about 60k, and his last dose of Vidaza was in late October 2019. The pt has had one blood transfusion ever, which was in November 2019. The pt's wife notes that during this time of not taking Vidaza, she has not noticed a change in her husband's energy levels or day to day activities. The pt denies dizziness, light headedness, fatigue, abnormal bruising, nose bleeds, gum bleeds, or other concerns for bleeding. The pt and wife deny recent or frequent infections.  The pt's wife notes that Vidaza was stopped to "take a break," trend his counts, and to move from [place] His last BM Bx was November 2019.  The pt and his wife note that there have not been other recent medical concerns. She notes that the pt's last A1C was 4.9.  The pt's wife notes that he is current with his annual flu vaccine, both every 5-year Prevnar and Pneumovax, and shingles vaccine.  The pt's wife notes that the pt's memory is "slowly decreasing," and he no longer drives. The pt's [facility] notes [facility] loss," and has taken Aricept for 20 years.  Most recent lab results (02/19/18) of CBC w/diff and CMP is as follows: all values are WNL except for WBC at 1.5k, RBC at 2.42, HGB at 7.9, HCT at 23.3, MCH at 32.2, PLT at 28k, RDW at 18.8, ANC at 700, Lymphocytes at 600, Monocytes at 200, Glucose at 111, BUN at 28.  On review of systems, pt  reports stable energy levels, eating well, stable weight, good appetite, and denies fevers, fatigue, light headedness, dizziness, nose bleeds, gum bleeds, abnormal bruising, other concerns for bleeding, falls, recent infections, frequent infections, bone pains, problems passing urine, abdominal pains, lower abdominal pains, and any other symptoms.  On PMHx the pt reports MDS, HLD, Early onset Alzheimer's  dementia, Hypothyroidism, hypertrophy or prostate with urinary obstruction.   Interval History:  PEYSON POSTEMA returns today for management and evaluation of his MDS. We are joined by pt's wife. The patient's last visit with [place] was on 11/19/2019. The pt reports that he is doing well overall.  The pt reports that his nasal and gum bleeding has improved significantly. Shaun Hobbs also notes that the pt had ear wax removal two weeks ago. She began to notice dried blood around his ears after this. This has improved as well. Pt has continued Lysteda twice per day. He denies any recent infections.   Lab results today (02/11/20) of CBC w/diff is as follows: all values are WNL except for WBC at 2.6K, RBC at 2.39, Hgb at 7.2, HCT at 24.0, MCV at 100.4, RDW at 20.9, PLT at 6K.  On review of systems, pt reports penile bleeding, improving nose/gum/ear bleeds and denies abnormal/excessive bleeding, bone pain, bloody/black stools, low appetite, abdominal pain, leg swelling  and any other symptoms.    MEDICAL HISTORY:  MDS, HLD, Early onset Alzheimer's dementia, Hypothyroidism, hypertrophy or prostate with urinary obstruction.   SURGICAL HISTORY: BM Bx   SOCIAL HISTORY: Social History   Socioeconomic History  . Marital status: Married    Spouse name: Not on file  . Number of children: Not on file  . Years of education: Not on file  . Highest education level: Not on file  Occupational History  . Not on file  Tobacco Use  . Smoking status: Former Smoker    Quit date: 1981    Years since quitting: 41.0  . Smokeless tobacco: Never Used  Vaping Use  . Vaping Use: Never used  Substance and Sexual Activity  . Alcohol use: Yes  . Drug use: Never  . Sexual activity: Not Currently  Other Topics Concern  . Not on file  Social History Narrative  . Not on file   Social Determinants of Health   Financial Resource Strain: Not on file  Food Insecurity: Not on file  Transportation Needs: Not on  file  Physical Activity: Not on file  Stress: Not on file  Social Connections: Not on file  Intimate Partner Violence: Not on file    FAMILY HISTORY: No family history on file.  ALLERGIES:  is allergic to sulfa antibiotics.  MEDICATIONS:  Current Outpatient Medications  Medication Sig Dispense Refill  . Cholecalciferol (VITAMIN D3 PO) Take 1,000 Units by mouth daily.     Marland Kitchen donepezil (ARICEPT) 10 MG tablet Take 10 mg by mouth at bedtime.    Marland Kitchen levothyroxine (SYNTHROID, LEVOTHROID) 100 MCG tablet Take 100 mcg by mouth daily before breakfast.    . MAGNESIUM PO Take 400 mg by mouth daily.     . memantine (NAMENDA) 10 MG tablet Take by mouth.    . mupirocin ointment (BACTROBAN) 2 % Apply 1 application topically 2 (two) times daily. To area of induration on forearm 22 g 2  . sertraline (ZOLOFT) 100 MG tablet Take 100 mg by mouth at bedtime.    . tamsulosin (FLOMAX) 0.4 MG CAPS capsule Take 0.4 mg by mouth at bedtime.    Marland Kitchen  tranexamic acid (LYSTEDA) 650 MG TABS tablet Take 1 tablet (650 mg total) by mouth 2 (two) times daily. 180 tablet 0   No current facility-administered medications for this visit.    REVIEW OF SYSTEMS:   A 10+ POINT REVIEW OF SYSTEMS WAS OBTAINED including neurology, dermatology, psychiatry, cardiac, respiratory, lymph, extremities, GI, GU, Musculoskeletal, constitutional, breasts, reproductive, HEENT.  All pertinent positives are noted in the HPI.  All others are negative.    PHYSICAL EXAMINATION: ECOG PERFORMANCE STATUS: 2-3   . Vitals:   02/11/20 0923  BP: (!) 115/52  Pulse: 69  Resp: 16  Temp: 97.8 F (36.6 C)  SpO2: 100%   Filed Weights   02/11/20 0923  Weight: 164 lb 9.6 oz (74.7 kg)   .Body mass index is 23.62 kg/m.   Exam was given in a chair   GENERAL:alert, in no acute distress and comfortable SKIN: no acute rashes, no significant lesions EYES: conjunctiva are pink and non-injected, sclera anicteric OROPHARYNX: MMM, no exudates, no  oropharyngeal erythema or ulceration NECK: supple, no JVD LYMPH:  no palpable lymphadenopathy in the cervical, axillary or inguinal regions LUNGS: clear to auscultation b/l with normal respiratory effort HEART: regular rate & rhythm ABDOMEN:  normoactive bowel sounds , non tender, not distended. No palpable hepatosplenomegaly.  Extremity: no pedal edema PSYCH: alert & oriented x 3 with fluent speech NEURO: no focal motor/sensory deficits  LABORATORY DATA:  I have reviewed the data as listed  . CBC Latest Ref Rng & Units 02/11/2020 01/28/2020 01/14/2020  WBC 4.0 - 10.5 K/uL 2.6(L) 3.8(L) 2.6(L)  Hemoglobin 13.0 - 17.0 g/dL 7.2(L) 8.2(L) 8.7(L)  Hematocrit 39.0 - 52.0 % 24.0(L) 26.9(L) 28.4(L)  Platelets 150 - 400 K/uL 6(LL) 6(LL) <5(LL)   ANC 500 . CMP Latest Ref Rng & Units 03/26/2019 02/26/2019 01/15/2019  Glucose 70 - 99 mg/dL 100(H) 130(H) 124(H)  BUN 8 - 23 mg/dL 25(H) 20 15  Creatinine 0.61 - 1.24 mg/dL 0.89 0.92 0.80  Sodium 135 - 145 mmol/L 140 139 138  Potassium 3.5 - 5.1 mmol/L 4.4 4.1 4.2  Chloride 98 - 111 mmol/L 109 108 105  CO2 22 - 32 mmol/L _0 Calcium 8.9 - 10.3 mg/dL 8.5(L) 8.2(L) 8.2(L)  Total Protein 6.5 - 8.1 g/dL 6.5 6.2(L) 6.0(L)  Total Bilirubin 0.3 - 1.2 mg/dL 0.4 0.5 0.4  Alkaline Phos 38 - 126 U/L 60 52 56  AST 15 - 41 U/L 14(L) 12(L) 14(L)  ALT 0 - 44 U/L 9 <6 8    CBC w/diff and CMP:    11/19/15 BM Bx:   11/19/15 Cytogenetics:   12/27/17 Bm Bx:    RADIOGRAPHIC STUDIES: I have personally reviewed the radiological images as listed and agreed with the findings in the report. No results found.  ASSESSMENT & PLAN:  87 y.o. male with:  1. 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 No evidence for a lymphoproliferative  disorder. Normal cytogenetics. 08/28/07 BM Bx revealed mild dyserythropoiesis and a suggestion of some dysplasia within the myeloid series. 04/17/12 BM Bx revealed hypercellular marrow with 50% cellularity, 1.5% blasts, with trilineage hematopoiesis, marrow monocytosis, mild myeloid and megakaryocytic dysplasia, adequate storage and iron utilization, no ring sideroblasts. Small monoclonal B lymphocytic population of uncertain significance. 11/19/15 BM Bx revealed Hypercellular marrow for age about  70% with panmyelosis including increased megakaryocytes with atypia, moderate marrow monocytosis about 15-20% and <5% blasts. Minute CD5 positive monoclonal B-cell population. Storage iron present. 12/27/17 BM Bx revealed normocellular marrow for age with 30% cellularity and trilineage hematopoiesis, mild megakaryocytic hyperplasia with dyspoietic changes, mild erythroid hyperplasia with ringed sideroblasts, and minute population of monoclonal B cells detected by flow.  S/p 21 cycles of Vidaza completed in the week of November 30, 2017. Previous oncologic notes suggest that the patient's pancytopenia was continuing to worsen through [place] treatment.  Labs upon initial presentation from 02/19/18, WBC at 1.5k, HGB at 7.9, PLT at 28k, [place] at 700   PLAN: -Discussed pt labwork today, 02/11/20; worsened anemia, WBC continues to fluctuate, PLT remain nearly undetectable.  -Advised pt that anemia can contribute to a cold sensation.  -Will give 1 unit of PRBC and 2 units of PLT today - continue q2weeks prn -Continue Lysteda as prescribed -Will see back in 12 weeks with labs   FOLLOW UP: Plz schedule, labs, 1 unit of PRBC and 2 units of Platelets q2weeks x 8 RTC with Dr Irene Limbo in 12 weeks   The total time spent in the appt was 30 minutes and more than 50% was on counseling and direct patient cares, monitoring and mx of transfusion requirements.  All of the patient's questions were answered with apparent  satisfaction. The patient knows to call the clinic with any problems, questions or concerns.    Sullivan Lone MD [place] AAHIVMS [facility] Hematology/Oncology Physician [facility]  (Office):       [phone] (Work cell):  [phone] (Fax):           [phone]  02/11/2020 9:55 AM  I, Yevette Edwards, am acting as a scribe for Dr. Sullivan Lone.   .I have reviewed the above documentation for accuracy and completeness, and I agree with the above. Brunetta Genera MD

## 2020-02-11 ENCOUNTER — Inpatient Hospital Stay: Payer: Medicare Other

## 2020-02-11 ENCOUNTER — Other Ambulatory Visit: Payer: Self-pay

## 2020-02-11 ENCOUNTER — Other Ambulatory Visit: Payer: Self-pay | Admitting: *Deleted

## 2020-02-11 ENCOUNTER — Inpatient Hospital Stay: Payer: Medicare Other | Attending: Hematology

## 2020-02-11 ENCOUNTER — Inpatient Hospital Stay (HOSPITAL_BASED_OUTPATIENT_CLINIC_OR_DEPARTMENT_OTHER): Payer: Medicare Other | Admitting: Hematology

## 2020-02-11 VITALS — BP 115/52 | HR 69 | Temp 97.8°F | Resp 16 | Ht 70.0 in | Wt 164.6 lb

## 2020-02-11 DIAGNOSIS — D469 Myelodysplastic syndrome, unspecified: Secondary | ICD-10-CM | POA: Diagnosis present

## 2020-02-11 DIAGNOSIS — D649 Anemia, unspecified: Secondary | ICD-10-CM

## 2020-02-11 DIAGNOSIS — D696 Thrombocytopenia, unspecified: Secondary | ICD-10-CM | POA: Diagnosis not present

## 2020-02-11 LAB — CBC WITH DIFFERENTIAL (CANCER CENTER ONLY)
Abs Immature Granulocytes: 0 10*3/uL (ref 0.00–0.07)
Basophils Absolute: 0 10*3/uL (ref 0.0–0.1)
Basophils Relative: 0 %
Eosinophils Absolute: 0 10*3/uL (ref 0.0–0.5)
Eosinophils Relative: 0 %
HCT: 24 % — ABNORMAL LOW (ref 39.0–52.0)
Hemoglobin: 7.2 g/dL — ABNORMAL LOW (ref 13.0–17.0)
Immature Granulocytes: 0 %
Lymphocytes Relative: 27 %
Lymphs Abs: 0.7 10*3/uL (ref 0.7–4.0)
MCH: 30.1 pg (ref 26.0–34.0)
MCHC: 30 g/dL (ref 30.0–36.0)
MCV: 100.4 fL — ABNORMAL HIGH (ref 80.0–100.0)
Monocytes Absolute: 0.3 10*3/uL (ref 0.1–1.0)
Monocytes Relative: 11 %
Neutro Abs: 1.6 10*3/uL — ABNORMAL LOW (ref 1.7–7.7)
Neutrophils Relative %: 62 %
Platelet Count: 6 10*3/uL — CL (ref 150–400)
RBC: 2.39 MIL/uL — ABNORMAL LOW (ref 4.22–5.81)
RDW: 20.9 % — ABNORMAL HIGH (ref 11.5–15.5)
WBC Count: 2.6 10*3/uL — ABNORMAL LOW (ref 4.0–10.5)
nRBC: 0 % (ref 0.0–0.2)

## 2020-02-11 LAB — SAMPLE TO BLOOD BANK

## 2020-02-11 LAB — PREPARE RBC (CROSSMATCH)

## 2020-02-11 MED ORDER — METHYLPREDNISOLONE SODIUM SUCC 40 MG IJ SOLR
40.0000 mg | Freq: Once | INTRAMUSCULAR | Status: AC
  Administered 2020-02-11: 40 mg via INTRAVENOUS

## 2020-02-11 MED ORDER — METHYLPREDNISOLONE SODIUM SUCC 40 MG IJ SOLR
INTRAMUSCULAR | Status: AC
  Filled 2020-02-11: qty 1

## 2020-02-11 MED ORDER — ACETAMINOPHEN 325 MG PO TABS
ORAL_TABLET | ORAL | Status: AC
  Filled 2020-02-11: qty 2

## 2020-02-11 MED ORDER — ACETAMINOPHEN 325 MG PO TABS
650.0000 mg | ORAL_TABLET | Freq: Once | ORAL | Status: AC
  Administered 2020-02-11: 650 mg via ORAL

## 2020-02-11 MED ORDER — SODIUM CHLORIDE 0.9% IV SOLUTION
250.0000 mL | Freq: Once | INTRAVENOUS | Status: AC
  Administered 2020-02-11: 250 mL via INTRAVENOUS
  Filled 2020-02-11: qty 250

## 2020-02-11 NOTE — Patient Instructions (Signed)
Platelet Transfusion A platelet transfusion is a procedure in which you receive donated platelets through an IV. Platelets are tiny pieces of blood cells. When you get an injury, platelets clump together in the area to form a blood clot. This helps stop bleeding and is the beginning of the healing process. If you have too few platelets, your blood may have trouble clotting. This may cause you to bleed and bruise very easily. You may need a platelet transfusion if you have a condition that causes a low number of platelets (thrombocytopenia). A platelet transfusion may be used to stop or prevent excessive bleeding. Tell a health care provider about:  Any reactions you have had during previous transfusions.  Any allergies you have.  All medicines you are taking, including vitamins, herbs, eye drops, creams, and over-the-counter medicines.  Any blood disorders you have.  Any surgeries you have had.  Any medical conditions you have.  Whether you are pregnant or may be pregnant. What are the risks? Generally, this is a safe procedure. However, problems may occur, including:  Fever.  Infection.  Allergic reaction to the donor platelets.  Your body's disease-fighting system (immune system) attacking the donor platelets (hemolytic reaction). This is rare.  A rare reaction that causes lung damage (transfusion-related acute lung injury). What happens before the procedure? Medicines  Ask your health care provider about: ? Changing or stopping your regular medicines. This is especially important if you are taking diabetes medicines or blood thinners. ? Taking medicines such as aspirin and ibuprofen. These medicines can thin your blood. Do not take these medicines unless your health care provider tells you to take them. ? Taking over-the-counter medicines, vitamins, herbs, and supplements. General instructions  You will have a blood test to determine your blood type. Your blood type  determines what kind of platelets you will be given.  Follow instructions from your health care provider about eating or drinking restrictions.  If you have had an allergic reaction to a transfusion in the past, you may be given medicine to help prevent a reaction.  Your temperature, blood pressure, pulse, and breathing will be monitored. What happens during the procedure?   An IV will be inserted into one of your veins.  For your safety, two health care providers will verify your identity along with the donor platelets about to be infused.  A bag of donor platelets will be connected to your IV. The platelets will flow into your bloodstream. This usually takes 30-60 minutes.  Your temperature, blood pressure, pulse, and breathing will be monitored during the transfusion. This helps detect early signs of any reaction.  You will also be monitored for other symptoms that may indicate a reaction, including chills, hives, or itching.  If you have signs of a reaction at any time, your transfusion will be stopped, and you may be given medicine to help manage the reaction.  When your transfusion is complete, your IV will be removed.  Pressure may be applied to the IV site for a few minutes to stop any bleeding.  The IV site will be covered with a bandage (dressing). The procedure may vary among health care providers and hospitals. What happens after the procedure?  Your blood pressure, temperature, pulse, and breathing will be monitored until you leave the hospital or clinic.  You may have some bruising and soreness at your IV site. Follow these instructions at home: Medicines  Take over-the-counter and prescription medicines only as told by your health care provider.    Talk with your health care provider before you take any medicines that contain aspirin or NSAIDs. These medicines increase your risk for dangerous bleeding. General instructions  Change or remove your dressing as told  by your health care provider.  Return to your normal activities as told by your health care provider. Ask your health care provider what activities are safe for you.  Do not take baths, swim, or use a hot tub until your health care provider approves. Ask your health care provider if you may take showers.  Check your IV site every day for signs of infection. Check for: ? Redness, swelling, or pain. ? Fluid or blood. If fluid or blood drains from your IV site, use your hands to press down firmly on a bandage covering the area for a minute or two. Doing this should stop the bleeding. ? Warmth. ? Pus or a bad smell.  Keep all follow-up visits as told by your health care provider. This is important. Contact a health care provider if you have:  A headache that does not go away with medicine.  Hives, rash, or itchy skin.  Nausea or vomiting.  Unusual tiredness or weakness.  Signs of infection at your IV site. Get help right away if:  You have a fever or chills.  You urinate less often than usual.  Your urine is darker colored than normal.  You have any of the following: ? Trouble breathing. ? Pain in your back, abdomen, or chest. ? Cool, clammy skin. ? A fast heartbeat. Summary  Platelets are tiny pieces of blood cells that clump together to form a blood clot when you have an injury. If you have too few platelets, your blood may have trouble clotting.  A platelet transfusion is a procedure in which you receive donated platelets through an IV.  A platelet transfusion may be used to stop or prevent excessive bleeding.  After the procedure, check your IV site every day for signs of infection, including redness, swelling, pain, or warmth. This information is not intended to replace advice given to you by your health care provider. Make sure you discuss any questions you have with your health care provider. Document Revised: 03/01/2017 Document Reviewed: 03/01/2017 Elsevier  Patient Education  2020 [employer]   Blood Transfusion, Adult, Care After This sheet gives you information about how to care for yourself after your procedure. Your doctor may also give you more specific instructions. If you have problems or questions, contact your doctor. What can I expect after the procedure? After the procedure, it is common to have:  Bruising and soreness at the IV site.  A fever or chills on the day of the procedure. This may be your body's response to the new blood cells received.  A headache. Follow these instructions at home: Insertion site care      Follow instructions from your doctor about how to take care of your insertion site. This is where an IV tube was put into your vein. Make sure you: ? Wash your hands with soap and water before and after you change your bandage (dressing). If you cannot use soap and water, use hand sanitizer. ? Change your bandage as told by your doctor.  Check your insertion site every day for signs of infection. Check for: ? Redness, swelling, or pain. ? Bleeding from the site. ? Warmth. ? Pus or a bad smell. General instructions  Take over-the-counter and prescription medicines only as told by your doctor.  Rest   as told by your doctor.  Go back to your normal activities as told by your doctor.  Keep all follow-up visits as told by your doctor. This is important. Contact a doctor if:  You have itching or red, swollen areas of skin (hives).  You feel worried or nervous (anxious).  You feel weak after doing your normal activities.  You have redness, swelling, warmth, or pain around the insertion site.  You have blood coming from the insertion site, and the blood does not stop with pressure.  You have pus or a bad smell coming from the insertion site. Get help right away if:  You have signs of a serious reaction. This may be coming from an allergy or the body's defense system (immune system). Signs  include: ? Trouble breathing or shortness of breath. ? Swelling of the face or feeling warm (flushed). ? Fever or chills. ? Head, chest, or back pain. ? Dark pee (urine) or blood in the pee. ? Widespread rash. ? Fast heartbeat. ? Feeling dizzy or light-headed. You may receive your blood transfusion in an outpatient setting. If so, you will be told whom to contact to report any reactions. These symptoms may be an emergency. Do not wait to see if the symptoms will go away. Get medical help right away. Call your local emergency services (911 in the [place] Do not drive yourself to the hospital. Summary  Bruising and soreness at the IV site are common.  Check your insertion site every day for signs of infection.  Rest as told by your doctor. Go back to your normal activities as told by your doctor.  Get help right away if you have signs of a serious reaction. This information is not intended to replace advice given to you by your health care provider. Make sure you discuss any questions you have with your health care provider. Document Revised: 07/19/2018 Document Reviewed: 07/19/2018 Elsevier Patient Education  2020 [employer]  

## 2020-02-11 NOTE — Progress Notes (Signed)
Per Dr. Clyda Greener Verbal order- entered orders for infusion today: 1 unit PRBCs and 2 units Platelets, with premeds Tylenol650 PO and Solumedrol IV 40 mg. Confirmed orders with Beth @ [facility]. They will contact infusion when blood products are ready.

## 2020-02-12 LAB — PREPARE PLATELET PHERESIS
Unit division: 0
Unit division: 0

## 2020-02-12 LAB — TYPE AND SCREEN
ABO/RH(D): A POS
Antibody Screen: POSITIVE
Donor AG Type: NEGATIVE
Unit division: 0

## 2020-02-12 LAB — BPAM PLATELET PHERESIS
Blood Product Expiration Date: 202201062359
Blood Product Expiration Date: 202201062359
ISSUE DATE / TIME: 202201041116
ISSUE DATE / TIME: 202201041231
Unit Type and Rh: 6200
Unit Type and Rh: 6200

## 2020-02-12 LAB — BPAM RBC
Blood Product Expiration Date: 202201112359
ISSUE DATE / TIME: 202201041229
Unit Type and Rh: 6200

## 2020-02-14 ENCOUNTER — Ambulatory Visit (INDEPENDENT_AMBULATORY_CARE_PROVIDER_SITE_OTHER): Payer: Medicare Other | Admitting: Podiatry

## 2020-02-14 ENCOUNTER — Encounter: Payer: Self-pay | Admitting: Podiatry

## 2020-02-14 ENCOUNTER — Other Ambulatory Visit: Payer: Self-pay

## 2020-02-14 DIAGNOSIS — M79674 Pain in right toe(s): Secondary | ICD-10-CM

## 2020-02-14 DIAGNOSIS — B351 Tinea unguium: Secondary | ICD-10-CM

## 2020-02-14 DIAGNOSIS — M79675 Pain in left toe(s): Secondary | ICD-10-CM | POA: Diagnosis not present

## 2020-02-14 NOTE — Progress Notes (Signed)
This patient returns to the office for evaluation and treatment of long thick painful nails .  This patient is unable to trim his own nails since the patient cannot reach his feet.  Patient says the nails are painful walking and wearing his shoes. Patient has not been seen since March 2021. He presents to the office with his wife.  He returns for preventive foot care services.  General Appearance  Alert, conversant and in no acute stress.  Vascular  Dorsalis pedis and posterior tibial  pulses are palpable  bilaterally.  Capillary return is within normal limits  bilaterally. Temperature is within normal limits  bilaterally.  Neurologic  Senn-Weinstein monofilament wire test within normal limits  bilaterally. Muscle power within normal limits bilaterally.  Nails Thick disfigured discolored nails with subungual debris  from hallux to fifth toes bilaterally. No evidence of bacterial infection or drainage bilaterally.  Orthopedic  No limitations of motion  feet .  No crepitus or effusions noted.  No bony pathology or digital deformities noted.  Skin  normotropic skin with no porokeratosis noted bilaterally.  No signs of infections or ulcers noted.     Onychomycosis  Pain in toes right foot  Pain in toes left foot  Debridement  of nails  1-5  B/L with a nail nipper.  Nails were then filed using a dremel tool with no incidents.    RTC  4 months    Gardiner Barefoot DPM

## 2020-02-25 ENCOUNTER — Telehealth: Payer: Self-pay | Admitting: *Deleted

## 2020-02-25 ENCOUNTER — Inpatient Hospital Stay: Payer: Medicare Other

## 2020-02-25 ENCOUNTER — Other Ambulatory Visit: Payer: Self-pay

## 2020-02-25 ENCOUNTER — Other Ambulatory Visit: Payer: Self-pay | Admitting: *Deleted

## 2020-02-25 ENCOUNTER — Ambulatory Visit (HOSPITAL_BASED_OUTPATIENT_CLINIC_OR_DEPARTMENT_OTHER): Payer: Medicare Other | Admitting: Medical

## 2020-02-25 DIAGNOSIS — D649 Anemia, unspecified: Secondary | ICD-10-CM

## 2020-02-25 DIAGNOSIS — D469 Myelodysplastic syndrome, unspecified: Secondary | ICD-10-CM

## 2020-02-25 LAB — CBC WITH DIFFERENTIAL (CANCER CENTER ONLY)
Abs Immature Granulocytes: 0 10*3/uL (ref 0.00–0.07)
Basophils Absolute: 0 10*3/uL (ref 0.0–0.1)
Basophils Relative: 0 %
Eosinophils Absolute: 0 10*3/uL (ref 0.0–0.5)
Eosinophils Relative: 1 %
HCT: 25.5 % — ABNORMAL LOW (ref 39.0–52.0)
Hemoglobin: 8 g/dL — ABNORMAL LOW (ref 13.0–17.0)
Immature Granulocytes: 0 %
Lymphocytes Relative: 28 %
Lymphs Abs: 0.9 10*3/uL (ref 0.7–4.0)
MCH: 31.5 pg (ref 26.0–34.0)
MCHC: 31.4 g/dL (ref 30.0–36.0)
MCV: 100.4 fL — ABNORMAL HIGH (ref 80.0–100.0)
Monocytes Absolute: 0.4 10*3/uL (ref 0.1–1.0)
Monocytes Relative: 12 %
Neutro Abs: 1.8 10*3/uL (ref 1.7–7.7)
Neutrophils Relative %: 59 %
Platelet Count: <5 10*3/uL — CL (ref 150–400)
RBC: 2.54 MIL/uL — ABNORMAL LOW (ref 4.22–5.81)
RDW: 20.2 % — ABNORMAL HIGH (ref 11.5–15.5)
WBC Count: 3 10*3/uL — ABNORMAL LOW (ref 4.0–10.5)
nRBC: 0 % (ref 0.0–0.2)

## 2020-02-25 LAB — SAMPLE TO BLOOD BANK

## 2020-02-25 MED ORDER — ACETAMINOPHEN 325 MG PO TABS
ORAL_TABLET | ORAL | Status: AC
  Filled 2020-02-25: qty 2

## 2020-02-25 MED ORDER — METHYLPREDNISOLONE SODIUM SUCC 40 MG IJ SOLR
INTRAMUSCULAR | Status: AC
  Filled 2020-02-25: qty 1

## 2020-02-25 MED ORDER — METHYLPREDNISOLONE SODIUM SUCC 40 MG IJ SOLR
40.0000 mg | Freq: Once | INTRAMUSCULAR | Status: AC
  Administered 2020-02-25: 40 mg via INTRAVENOUS

## 2020-02-25 MED ORDER — SODIUM CHLORIDE 0.9% IV SOLUTION
250.0000 mL | Freq: Once | INTRAVENOUS | Status: AC
  Administered 2020-02-25: 250 mL via INTRAVENOUS
  Filled 2020-02-25: qty 250

## 2020-02-25 MED ORDER — TRIAMCINOLONE ACETONIDE 0.1 % EX LOTN: 1.0000 "application " | TOPICAL_LOTION | Freq: Three times a day (TID) | CUTANEOUS | 2 refills | Status: DC

## 2020-02-25 MED ORDER — ACETAMINOPHEN 325 MG PO TABS
650.0000 mg | ORAL_TABLET | Freq: Once | ORAL | Status: AC
  Administered 2020-02-25: 650 mg via ORAL

## 2020-02-25 NOTE — Progress Notes (Signed)
Verbal order per Dr.Kale: 2 units platelets (PLT <5) Premeds: Tylenol 650 mg PO; Solumedrol 40 mg IV Platelets preparation verified with [facility]. [facility] states one unit platelets available now - they will obtain second unit as soon as possible

## 2020-02-25 NOTE — Telephone Encounter (Signed)
CRITICAL VALUE STICKER  CRITICAL VALUE: PLT less than 5; HGB 8.0  RECEIVER (on-site recipient of call): Georgina Pillion, RN DATE & TIME NOTIFIED: 02/25/20; 1132  MESSENGER (representative from lab):Pam  MD NOTIFIED: Dr.Kale   TIME OF NOTIFICATION:1155  RESPONSE: Platelets as previously ordered

## 2020-02-25 NOTE — Patient Instructions (Signed)
Platelet Transfusion A platelet transfusion is a procedure in which you receive donated platelets through an IV. Platelets are tiny pieces of blood cells. When you get an injury, platelets clump together in the area to form a blood clot. This helps stop bleeding and is the beginning of the healing process. If you have too few platelets, your blood may have trouble clotting. This may cause you to bleed and bruise very easily. You may need a platelet transfusion if you have a condition that causes a low number of platelets (thrombocytopenia). A platelet transfusion may be used to stop or prevent excessive bleeding. Tell a health care provider about:  Any reactions you have had during previous transfusions.  Any allergies you have.  All medicines you are taking, including vitamins, herbs, eye drops, creams, and over-the-counter medicines.  Any blood disorders you have.  Any surgeries you have had.  Any medical conditions you have.  Whether you are pregnant or may be pregnant. What are the risks? Generally, this is a safe procedure. However, problems may occur, including:  Fever.  Infection.  Allergic reaction to the donor platelets.  Your body's disease-fighting system (immune system) attacking the donor platelets (hemolytic reaction). This is rare.  A rare reaction that causes lung damage (transfusion-related acute lung injury). What happens before the procedure? Medicines  Ask your health care provider about: ? Changing or stopping your regular medicines. This is especially important if you are taking diabetes medicines or blood thinners. ? Taking medicines such as aspirin and ibuprofen. These medicines can thin your blood. Do not take these medicines unless your health care provider tells you to take them. ? Taking over-the-counter medicines, vitamins, herbs, and supplements. General instructions  You will have a blood test to determine your blood type. Your blood type  determines what kind of platelets you will be given.  Follow instructions from your health care provider about eating or drinking restrictions.  If you have had an allergic reaction to a transfusion in the past, you may be given medicine to help prevent a reaction.  Your temperature, blood pressure, pulse, and breathing will be monitored. What happens during the procedure?   An IV will be inserted into one of your veins.  For your safety, two health care providers will verify your identity along with the donor platelets about to be infused.  A bag of donor platelets will be connected to your IV. The platelets will flow into your bloodstream. This usually takes 30-60 minutes.  Your temperature, blood pressure, pulse, and breathing will be monitored during the transfusion. This helps detect early signs of any reaction.  You will also be monitored for other symptoms that may indicate a reaction, including chills, hives, or itching.  If you have signs of a reaction at any time, your transfusion will be stopped, and you may be given medicine to help manage the reaction.  When your transfusion is complete, your IV will be removed.  Pressure may be applied to the IV site for a few minutes to stop any bleeding.  The IV site will be covered with a bandage (dressing). The procedure may vary among health care providers and hospitals. What happens after the procedure?  Your blood pressure, temperature, pulse, and breathing will be monitored until you leave the hospital or clinic.  You may have some bruising and soreness at your IV site. Follow these instructions at home: Medicines  Take over-the-counter and prescription medicines only as told by your health care provider.    Talk with your health care provider before you take any medicines that contain aspirin or NSAIDs. These medicines increase your risk for dangerous bleeding. General instructions  Change or remove your dressing as told  by your health care provider.  Return to your normal activities as told by your health care provider. Ask your health care provider what activities are safe for you.  Do not take baths, swim, or use a hot tub until your health care provider approves. Ask your health care provider if you may take showers.  Check your IV site every day for signs of infection. Check for: ? Redness, swelling, or pain. ? Fluid or blood. If fluid or blood drains from your IV site, use your hands to press down firmly on a bandage covering the area for a minute or two. Doing this should stop the bleeding. ? Warmth. ? Pus or a bad smell.  Keep all follow-up visits as told by your health care provider. This is important. Contact a health care provider if you have:  A headache that does not go away with medicine.  Hives, rash, or itchy skin.  Nausea or vomiting.  Unusual tiredness or weakness.  Signs of infection at your IV site. Get help right away if:  You have a fever or chills.  You urinate less often than usual.  Your urine is darker colored than normal.  You have any of the following: ? Trouble breathing. ? Pain in your back, abdomen, or chest. ? Cool, clammy skin. ? A fast heartbeat. Summary  Platelets are tiny pieces of blood cells that clump together to form a blood clot when you have an injury. If you have too few platelets, your blood may have trouble clotting.  A platelet transfusion is a procedure in which you receive donated platelets through an IV.  A platelet transfusion may be used to stop or prevent excessive bleeding.  After the procedure, check your IV site every day for signs of infection, including redness, swelling, pain, or warmth. This information is not intended to replace advice given to you by your health care provider. Make sure you discuss any questions you have with your health care provider. Document Revised: 03/01/2017 Document Reviewed: 03/01/2017 Elsevier  Patient Education  2020 [employer]   Blood Transfusion, Adult, Care After This sheet gives you information about how to care for yourself after your procedure. Your doctor may also give you more specific instructions. If you have problems or questions, contact your doctor. What can I expect after the procedure? After the procedure, it is common to have:  Bruising and soreness at the IV site.  A fever or chills on the day of the procedure. This may be your body's response to the new blood cells received.  A headache. Follow these instructions at home: Insertion site care      Follow instructions from your doctor about how to take care of your insertion site. This is where an IV tube was put into your vein. Make sure you: ? Wash your hands with soap and water before and after you change your bandage (dressing). If you cannot use soap and water, use hand sanitizer. ? Change your bandage as told by your doctor.  Check your insertion site every day for signs of infection. Check for: ? Redness, swelling, or pain. ? Bleeding from the site. ? Warmth. ? Pus or a bad smell. General instructions  Take over-the-counter and prescription medicines only as told by your doctor.  Rest   as told by your doctor.  Go back to your normal activities as told by your doctor.  Keep all follow-up visits as told by your doctor. This is important. Contact a doctor if:  You have itching or red, swollen areas of skin (hives).  You feel worried or nervous (anxious).  You feel weak after doing your normal activities.  You have redness, swelling, warmth, or pain around the insertion site.  You have blood coming from the insertion site, and the blood does not stop with pressure.  You have pus or a bad smell coming from the insertion site. Get help right away if:  You have signs of a serious reaction. This may be coming from an allergy or the body's defense system (immune system). Signs  include: ? Trouble breathing or shortness of breath. ? Swelling of the face or feeling warm (flushed). ? Fever or chills. ? Head, chest, or back pain. ? Dark pee (urine) or blood in the pee. ? Widespread rash. ? Fast heartbeat. ? Feeling dizzy or light-headed. You may receive your blood transfusion in an outpatient setting. If so, you will be told whom to contact to report any reactions. These symptoms may be an emergency. Do not wait to see if the symptoms will go away. Get medical help right away. Call your local emergency services (911 in the [place] Do not drive yourself to the hospital. Summary  Bruising and soreness at the IV site are common.  Check your insertion site every day for signs of infection.  Rest as told by your doctor. Go back to your normal activities as told by your doctor.  Get help right away if you have signs of a serious reaction. This information is not intended to replace advice given to you by your health care provider. Make sure you discuss any questions you have with your health care provider. Document Revised: 07/19/2018 Document Reviewed: 07/19/2018 Elsevier Patient Education  2020 [employer]  

## 2020-02-26 LAB — PREPARE PLATELET PHERESIS
Unit division: 0
Unit division: 0

## 2020-02-26 LAB — BPAM PLATELET PHERESIS
Blood Product Expiration Date: 202201202359
Blood Product Expiration Date: 202201202359
ISSUE DATE / TIME: 202201181322
ISSUE DATE / TIME: 202201181433
Unit Type and Rh: 5100
Unit Type and Rh: 6200

## 2020-02-27 NOTE — Progress Notes (Signed)
Shaun Hobbs was seen in the infusion room today.  He was with his wife.  She reports that he has low platelet count due to his MDS.  He also has excoriations where he has been scratching which causes him to bleed.  She does not know if he is itching or simply's scratching out of habit.  She was told to begin using Eucerin cream and was given a prescription for triamcinolone lotion.  Sandi Mealy, MHS, PA-C Physician Assistant

## 2020-03-08 ENCOUNTER — Other Ambulatory Visit: Payer: Self-pay

## 2020-03-08 ENCOUNTER — Encounter (HOSPITAL_COMMUNITY): Payer: Self-pay | Admitting: Emergency Medicine

## 2020-03-08 ENCOUNTER — Inpatient Hospital Stay (HOSPITAL_COMMUNITY)
Admission: EM | Admit: 2020-03-08 | Discharge: 2020-03-18 | DRG: 064 | Disposition: A | Discharge status: Rehabilitation Facility | Payer: Medicare Other | Attending: Internal Medicine | Admitting: Internal Medicine

## 2020-03-08 ENCOUNTER — Inpatient Hospital Stay (HOSPITAL_COMMUNITY): Payer: Medicare Other

## 2020-03-08 ENCOUNTER — Emergency Department (HOSPITAL_COMMUNITY): Payer: Medicare Other

## 2020-03-08 DIAGNOSIS — D696 Thrombocytopenia, unspecified: Secondary | ICD-10-CM | POA: Diagnosis present

## 2020-03-08 DIAGNOSIS — F0280 Dementia in other diseases classified elsewhere without behavioral disturbance: Secondary | ICD-10-CM | POA: Diagnosis present

## 2020-03-08 DIAGNOSIS — R21 Rash and other nonspecific skin eruption: Secondary | ICD-10-CM | POA: Diagnosis present

## 2020-03-08 DIAGNOSIS — I4430 Unspecified atrioventricular block: Secondary | ICD-10-CM | POA: Diagnosis present

## 2020-03-08 DIAGNOSIS — Z6822 Body mass index (BMI) 22.0-22.9, adult: Secondary | ICD-10-CM

## 2020-03-08 DIAGNOSIS — F0390 Unspecified dementia without behavioral disturbance: Secondary | ICD-10-CM | POA: Diagnosis present

## 2020-03-08 DIAGNOSIS — N401 Enlarged prostate with lower urinary tract symptoms: Secondary | ICD-10-CM | POA: Diagnosis present

## 2020-03-08 DIAGNOSIS — S065XAA Traumatic subdural hemorrhage with loss of consciousness status unknown, initial encounter: Secondary | ICD-10-CM | POA: Diagnosis present

## 2020-03-08 DIAGNOSIS — R1310 Dysphagia, unspecified: Secondary | ICD-10-CM | POA: Diagnosis present

## 2020-03-08 DIAGNOSIS — D469 Myelodysplastic syndrome, unspecified: Secondary | ICD-10-CM | POA: Diagnosis present

## 2020-03-08 DIAGNOSIS — G935 Compression of brain: Secondary | ICD-10-CM | POA: Diagnosis present

## 2020-03-08 DIAGNOSIS — Z789 Other specified health status: Secondary | ICD-10-CM | POA: Diagnosis not present

## 2020-03-08 DIAGNOSIS — E871 Hypo-osmolality and hyponatremia: Secondary | ICD-10-CM | POA: Diagnosis present

## 2020-03-08 DIAGNOSIS — W06XXXA Fall from bed, initial encounter: Secondary | ICD-10-CM | POA: Diagnosis present

## 2020-03-08 DIAGNOSIS — Z66 Do not resuscitate: Secondary | ICD-10-CM | POA: Diagnosis present

## 2020-03-08 DIAGNOSIS — R4182 Altered mental status, unspecified: Secondary | ICD-10-CM | POA: Diagnosis not present

## 2020-03-08 DIAGNOSIS — E039 Hypothyroidism, unspecified: Secondary | ICD-10-CM | POA: Diagnosis present

## 2020-03-08 DIAGNOSIS — R040 Epistaxis: Secondary | ICD-10-CM | POA: Diagnosis present

## 2020-03-08 DIAGNOSIS — Z7189 Other specified counseling: Secondary | ICD-10-CM | POA: Diagnosis not present

## 2020-03-08 DIAGNOSIS — Z882 Allergy status to sulfonamides status: Secondary | ICD-10-CM

## 2020-03-08 DIAGNOSIS — I69211 Memory deficit following other nontraumatic intracranial hemorrhage: Secondary | ICD-10-CM | POA: Diagnosis not present

## 2020-03-08 DIAGNOSIS — E7800 Pure hypercholesterolemia, unspecified: Secondary | ICD-10-CM | POA: Diagnosis present

## 2020-03-08 DIAGNOSIS — Z79899 Other long term (current) drug therapy: Secondary | ICD-10-CM

## 2020-03-08 DIAGNOSIS — S065X9A Traumatic subdural hemorrhage with loss of consciousness of unspecified duration, initial encounter: Secondary | ICD-10-CM | POA: Diagnosis present

## 2020-03-08 DIAGNOSIS — R9401 Abnormal electroencephalogram [EEG]: Secondary | ICD-10-CM | POA: Diagnosis present

## 2020-03-08 DIAGNOSIS — R000 Tachycardia, unspecified: Secondary | ICD-10-CM | POA: Diagnosis not present

## 2020-03-08 DIAGNOSIS — D693 Immune thrombocytopenic purpura: Secondary | ICD-10-CM | POA: Diagnosis present

## 2020-03-08 DIAGNOSIS — I6200 Nontraumatic subdural hemorrhage, unspecified: Secondary | ICD-10-CM | POA: Diagnosis present

## 2020-03-08 DIAGNOSIS — E162 Hypoglycemia, unspecified: Secondary | ICD-10-CM | POA: Diagnosis not present

## 2020-03-08 DIAGNOSIS — K068 Other specified disorders of gingiva and edentulous alveolar ridge: Secondary | ICD-10-CM | POA: Diagnosis present

## 2020-03-08 DIAGNOSIS — R338 Other retention of urine: Secondary | ICD-10-CM | POA: Diagnosis present

## 2020-03-08 DIAGNOSIS — R64 Cachexia: Secondary | ICD-10-CM | POA: Diagnosis present

## 2020-03-08 DIAGNOSIS — Z9221 Personal history of antineoplastic chemotherapy: Secondary | ICD-10-CM

## 2020-03-08 DIAGNOSIS — Z87891 Personal history of nicotine dependence: Secondary | ICD-10-CM | POA: Diagnosis not present

## 2020-03-08 DIAGNOSIS — G309 Alzheimer's disease, unspecified: Secondary | ICD-10-CM | POA: Diagnosis present

## 2020-03-08 DIAGNOSIS — Z8616 Personal history of COVID-19: Secondary | ICD-10-CM | POA: Diagnosis not present

## 2020-03-08 DIAGNOSIS — Z79890 Hormone replacement therapy (postmenopausal): Secondary | ICD-10-CM | POA: Diagnosis not present

## 2020-03-08 DIAGNOSIS — R630 Anorexia: Secondary | ICD-10-CM | POA: Diagnosis present

## 2020-03-08 DIAGNOSIS — R4701 Aphasia: Secondary | ICD-10-CM | POA: Diagnosis not present

## 2020-03-08 DIAGNOSIS — Z20822 Contact with and (suspected) exposure to covid-19: Secondary | ICD-10-CM | POA: Diagnosis present

## 2020-03-08 DIAGNOSIS — R509 Fever, unspecified: Secondary | ICD-10-CM | POA: Diagnosis present

## 2020-03-08 DIAGNOSIS — D61818 Other pancytopenia: Secondary | ICD-10-CM | POA: Diagnosis present

## 2020-03-08 DIAGNOSIS — R627 Adult failure to thrive: Secondary | ICD-10-CM | POA: Diagnosis present

## 2020-03-08 DIAGNOSIS — E0500 Thyrotoxicosis with diffuse goiter without thyrotoxic crisis or storm: Secondary | ICD-10-CM | POA: Diagnosis present

## 2020-03-08 DIAGNOSIS — N390 Urinary tract infection, site not specified: Secondary | ICD-10-CM | POA: Diagnosis present

## 2020-03-08 DIAGNOSIS — N400 Enlarged prostate without lower urinary tract symptoms: Secondary | ICD-10-CM | POA: Diagnosis present

## 2020-03-08 DIAGNOSIS — Z515 Encounter for palliative care: Secondary | ICD-10-CM | POA: Diagnosis not present

## 2020-03-08 DIAGNOSIS — R339 Retention of urine, unspecified: Secondary | ICD-10-CM | POA: Diagnosis not present

## 2020-03-08 DIAGNOSIS — I69291 Dysphagia following other nontraumatic intracranial hemorrhage: Secondary | ICD-10-CM | POA: Diagnosis present

## 2020-03-08 DIAGNOSIS — I69298 Other sequelae of other nontraumatic intracranial hemorrhage: Secondary | ICD-10-CM | POA: Diagnosis not present

## 2020-03-08 DIAGNOSIS — G9349 Other encephalopathy: Secondary | ICD-10-CM | POA: Diagnosis present

## 2020-03-08 DIAGNOSIS — R569 Unspecified convulsions: Secondary | ICD-10-CM | POA: Diagnosis present

## 2020-03-08 DIAGNOSIS — R319 Hematuria, unspecified: Secondary | ICD-10-CM | POA: Diagnosis not present

## 2020-03-08 DIAGNOSIS — I4892 Unspecified atrial flutter: Secondary | ICD-10-CM | POA: Diagnosis not present

## 2020-03-08 DIAGNOSIS — R32 Unspecified urinary incontinence: Secondary | ICD-10-CM | POA: Diagnosis present

## 2020-03-08 LAB — CBC WITH DIFFERENTIAL/PLATELET
Abs Immature Granulocytes: 0.01 10*3/uL (ref 0.00–0.07)
Basophils Absolute: 0 10*3/uL (ref 0.0–0.1)
Basophils Relative: 0 %
Eosinophils Absolute: 0 10*3/uL (ref 0.0–0.5)
Eosinophils Relative: 0 %
HCT: 22.9 % — ABNORMAL LOW (ref 39.0–52.0)
Hemoglobin: 7.4 g/dL — ABNORMAL LOW (ref 13.0–17.0)
Immature Granulocytes: 0 %
Lymphocytes Relative: 24 %
Lymphs Abs: 1 10*3/uL (ref 0.7–4.0)
MCH: 32.3 pg (ref 26.0–34.0)
MCHC: 32.3 g/dL (ref 30.0–36.0)
MCV: 100 fL (ref 80.0–100.0)
Monocytes Absolute: 0.5 10*3/uL (ref 0.1–1.0)
Monocytes Relative: 12 %
Neutro Abs: 2.6 10*3/uL (ref 1.7–7.7)
Neutrophils Relative %: 64 %
Platelets: <5 10*3/uL — CL (ref 150–400)
RBC: 2.29 MIL/uL — ABNORMAL LOW (ref 4.22–5.81)
RDW: 19.9 % — ABNORMAL HIGH (ref 11.5–15.5)
WBC: 4.1 10*3/uL (ref 4.0–10.5)
nRBC: 0.5 % — ABNORMAL HIGH (ref 0.0–0.2)

## 2020-03-08 LAB — URINALYSIS, ROUTINE W REFLEX MICROSCOPIC
Bacteria, UA: NONE SEEN
Bilirubin Urine: NEGATIVE
Glucose, UA: NEGATIVE mg/dL
Ketones, ur: NEGATIVE mg/dL
Leukocytes,Ua: NEGATIVE
Nitrite: NEGATIVE
Protein, ur: NEGATIVE mg/dL
Specific Gravity, Urine: 1.016 (ref 1.005–1.030)
pH: 6 (ref 5.0–8.0)

## 2020-03-08 LAB — FIBRINOGEN: Fibrinogen: 421 mg/dL (ref 210–475)

## 2020-03-08 LAB — COMPREHENSIVE METABOLIC PANEL
ALT: 9 U/L (ref 0–44)
AST: 15 U/L (ref 15–41)
Albumin: 3.7 g/dL (ref 3.5–5.0)
Alkaline Phosphatase: 56 U/L (ref 38–126)
Anion gap: 10 (ref 5–15)
BUN: 18 mg/dL (ref 8–23)
CO2: 21 mmol/L — ABNORMAL LOW (ref 22–32)
Calcium: 8.4 mg/dL — ABNORMAL LOW (ref 8.9–10.3)
Chloride: 97 mmol/L — ABNORMAL LOW (ref 98–111)
Creatinine, Ser: 0.98 mg/dL (ref 0.61–1.24)
GFR, Estimated: >60 mL/min (ref >60)
Glucose, Bld: 122 mg/dL — ABNORMAL HIGH (ref 70–99)
Potassium: 4.1 mmol/L (ref 3.5–5.1)
Sodium: 128 mmol/L — ABNORMAL LOW (ref 135–145)
Total Bilirubin: 1 mg/dL (ref 0.3–1.2)
Total Protein: 6.7 g/dL (ref 6.5–8.1)

## 2020-03-08 LAB — LACTIC ACID, PLASMA: Lactic Acid, Venous: 0.9 mmol/L (ref 0.5–1.9)

## 2020-03-08 LAB — PROTIME-INR
INR: 1.4 — ABNORMAL HIGH (ref 0.8–1.2)
Prothrombin Time: 16.5 seconds — ABNORMAL HIGH (ref 11.4–15.2)

## 2020-03-08 LAB — SARS CORONAVIRUS 2 BY RT PCR (HOSPITAL ORDER, PERFORMED IN ~~LOC~~ HOSPITAL LAB): SARS Coronavirus 2: NEGATIVE

## 2020-03-08 LAB — APTT: aPTT: 35 seconds (ref 24–36)

## 2020-03-08 LAB — D-DIMER, QUANTITATIVE: D-Dimer, Quant: 1.92 ug/mL-FEU — ABNORMAL HIGH (ref 0.00–0.50)

## 2020-03-08 LAB — SAVE SMEAR(SSMR), FOR PROVIDER SLIDE REVIEW

## 2020-03-08 MED ORDER — ACETAMINOPHEN 650 MG RE SUPP
650.0000 mg | Freq: Once | RECTAL | Status: AC
  Administered 2020-03-08: 650 mg via RECTAL
  Filled 2020-03-08: qty 1

## 2020-03-08 MED ORDER — VANCOMYCIN HCL 1250 MG/250ML IV SOLN
1250.0000 mg | INTRAVENOUS | Status: DC
  Administered 2020-03-09: 1250 mg via INTRAVENOUS
  Filled 2020-03-08 (×2): qty 250

## 2020-03-08 MED ORDER — ACETAMINOPHEN 325 MG PO TABS
650.0000 mg | ORAL_TABLET | Freq: Four times a day (QID) | ORAL | Status: DC | PRN
  Administered 2020-03-13: 650 mg via ORAL
  Filled 2020-03-08: qty 2

## 2020-03-08 MED ORDER — ACETAMINOPHEN 325 MG PO TABS: 650.0000 mg | ORAL_TABLET | Freq: Once | ORAL | Status: DC

## 2020-03-08 MED ORDER — METRONIDAZOLE IN NACL 5-0.79 MG/ML-% IV SOLN
500.0000 mg | Freq: Once | INTRAVENOUS | Status: AC
  Administered 2020-03-08: 500 mg via INTRAVENOUS
  Filled 2020-03-08: qty 100

## 2020-03-08 MED ORDER — SODIUM CHLORIDE 0.9 % IV SOLN: 10.0000 mL/h | Freq: Once | INTRAVENOUS | Status: DC

## 2020-03-08 MED ORDER — VANCOMYCIN HCL IN DEXTROSE 1-5 GM/200ML-% IV SOLN: 1000.0000 mg | Freq: Once | INTRAVENOUS | Status: DC

## 2020-03-08 MED ORDER — VANCOMYCIN HCL 1250 MG/250ML IV SOLN
1250.0000 mg | Freq: Once | INTRAVENOUS | Status: AC
  Administered 2020-03-08: 1250 mg via INTRAVENOUS
  Filled 2020-03-08: qty 250

## 2020-03-08 MED ORDER — LACTATED RINGERS IV SOLN: INTRAVENOUS | Status: AC

## 2020-03-08 MED ORDER — LEVETIRACETAM 750 MG PO TABS
750.0000 mg | ORAL_TABLET | Freq: Two times a day (BID) | ORAL | Status: DC
  Filled 2020-03-08: qty 1

## 2020-03-08 MED ORDER — SODIUM CHLORIDE 0.9 % IV SOLN
2.0000 g | Freq: Two times a day (BID) | INTRAVENOUS | Status: DC
  Administered 2020-03-08 – 2020-03-09 (×3): 2 g via INTRAVENOUS
  Filled 2020-03-08 (×3): qty 2

## 2020-03-08 MED ORDER — SODIUM CHLORIDE 0.9 % IV SOLN
2.0000 g | Freq: Once | INTRAVENOUS | Status: AC
  Administered 2020-03-08: 2 g via INTRAVENOUS
  Filled 2020-03-08: qty 2

## 2020-03-08 MED ORDER — POLYETHYLENE GLYCOL 3350 17 G PO PACK
17.0000 g | PACK | Freq: Every day | ORAL | Status: DC | PRN
Start: 2020-03-08 — End: 2020-03-18
  Administered 2020-03-14: 17 g via ORAL
  Filled 2020-03-08 (×2): qty 1

## 2020-03-08 MED ORDER — LEVETIRACETAM IN NACL 1500 MG/100ML IV SOLN
1500.0000 mg | Freq: Once | INTRAVENOUS | Status: AC
  Administered 2020-03-08: 1500 mg via INTRAVENOUS
  Filled 2020-03-08: qty 100

## 2020-03-08 MED ORDER — ACETAMINOPHEN 650 MG RE SUPP
650.0000 mg | Freq: Four times a day (QID) | RECTAL | Status: DC | PRN
  Administered 2020-03-09 (×2): 650 mg via RECTAL
  Filled 2020-03-08 (×2): qty 1

## 2020-03-08 NOTE — ED Triage Notes (Signed)
Pt from home via [facility].  Family reports generalized weakness and AMS since yesterday.  Pt has history of dementia.  Alert but not following commands and non-verbal.  Pt normally talks and functions with assistance.  20 g LAC.  Temp 101.7 per EMS.

## 2020-03-08 NOTE — Progress Notes (Signed)
LTM EEG hooked up and running - no initial skin breakdown - push button tested - neuro notified.  

## 2020-03-08 NOTE — ED Notes (Signed)
Dr. Kathrynn Humble notified of platelets <5.  Pt already acuity 2 and is next for treatment room.

## 2020-03-08 NOTE — Consult Note (Signed)
Neurosurgery Consultation  Reason for Consult: Subdural hematoma Referring Physician: Kathrynn Humble  CC: AMS  HPI: This is a 87 y.o. man w/ h/o MDS  that presents with altered mental status and generalized weakness since yesterday. Per chart review, he has a h/o early onset AD. Labs notable for PLT of <5, which is slightly lower than they've been over the past 1y. His wife describes that his baseline as interactive, poor remote and recent memory, helps her work on a puzzle every day. She notes he has had decreased interest over the past week or two, but declined significantly yesterday and became less verbal. He normally does not have a tremor and had a tremor in the RUE (pt is R handed) and difficulty picking up a puzzle piece off the floor. He didn't improve so she brought him in today.    ROS: A 14 point ROS was performed and is negative except as noted in the HPI but limited due to patient's aphasia.  PMHx:  Past Medical History:  Diagnosis Date   Anemia    Cancer ([place]) 2008   Myelodysplastic syndrome   Dementia ([place])    Hypothyroidism    FamHx: No family history on file. SocHx:  reports that he quit smoking about 41 years ago. He has never used smokeless tobacco. He reports current alcohol use. He reports that he does not use drugs.  Exam: Vital signs in last 24 hours: Temp:  [101.4 F (38.6 C)] 101.4 F (38.6 C) (01/30 0918) Pulse Rate:  [76-84] 77 (01/30 1130) Resp:  [18-20] 19 (01/30 1130) BP: (128-133)/(55-63) 133/63 (01/30 1130) SpO2:  [97 %-98 %] 98 % (01/30 1130) General: Awake, alert, lying in bed in NAD, picking at lines / cords Head: Normocephalic and atruamatic HEENT: Neck supple Pulmonary: breathing room air comfortably, no evidence of increased work of breathing Cardiac: RRR Abdomen: S NT ND Extremities: Warm and well perfused x4 Neuro: Eyes open spontaneously, does not FC, tries to answer his name but no intelligible verbal output, PERRL, gaze neutral Does not  FC in extremities but MAEx4 symmetrically   Assessment and Plan: 87 y.o. man w/ MDS, PLT of 5 p/w AMS. [place] personally reviewed, which shows L convexity mixed density SDH with minimal brain compression, no midline shift. Differential is broad but story is concerning for potential epileptogenic process in the L hemisphere causing aphasia and some R sided shaking that resolved.   -no acute neurosurgical intervention indicated at this time -SDH could be causing AMS via electrical rather than structural issue like PLEDs/seizures.  -traditional goal PLT count in the setting of intracranial hemorrhage is >50, given that he has dementia and such a low platelet count, I do not think he would be a surgical candidate. Given the complexity (at least to me) of his hematologic status, I would defer to heme if they think a different strategy is better with or without the use of other hemostatic agents. For example, if he also has ongoing platelet destruction and a goal of 50k is never going to happen with transfusions, then we can adjust goals accordingly. -recommend overnight video EEG, would hold off on AEDs unless he has a clinical seizure -please call with any concerns or questions  Judith Part, MD 03/08/20 12:25 PM [facility]

## 2020-03-08 NOTE — Progress Notes (Signed)
Pharmacy Antibiotic Note  Shaun Hobbs is a 87 y.o. male admitted on 03/08/2020 with sepsis.  Pharmacy has been consulted for Cefepime and Vancomycin dosing. Est weight of 75kg.      Temp (24hrs), Avg:101.4 F (38.6 C), Min:101.4 F (38.6 C), Max:101.4 F (38.6 C)  Recent Labs  Lab 03/08/20 0924 03/08/20 0925  WBC  --  4.1  CREATININE  --  0.98  LATICACIDVEN 0.9  --     CrCl cannot be calculated (Unknown ideal weight.).    Allergies  Allergen Reactions  . Sulfa Antibiotics Other (See Comments)    Patient reports: reaction in past  Patient reports: reaction in past     Antimicrobials this admission: 1/30 Cefepime >>  1/30 Vancomycin >>   Dose adjustments this admission: N/a  Microbiology results: Pending   Plan:  - Cefepime 2g IV q12h - Start Vancomycin 1250mg  IV q24h - Est Calc AUC 480 - Monitor patients renal function and urine output  - De-escalate ABX when appropriate   Thank you for allowing pharmacy to be a part of this patient's care.  Duanne Limerick PharmD. BCPS 03/08/2020 10:39 AM

## 2020-03-08 NOTE — H&P (Cosign Needed Addendum)
Date: 03/08/2020               Patient Name:  Shaun Hobbs MRN: [id number]  DOB: 07-Jun-1928 Age / Sex: 87 y.o., male   PCP: Chesley Noon, MD         Medical Service: Internal Medicine Teaching Service         Attending Physician: Dr. Lucious Groves, DO    First Contact: Dr. Johnney Ou Pager: [phone]  Second Contact: Dr. Gilford Rile Pager: [phone]       After Hours (After 5p/  First Contact Pager: [phone]  weekends / holidays): Second Contact Pager: [phone]   Chief Complaint: AMS  History of Present Illness:   Patient is level 5 caveat, HPI per his wife.   Shaun Hobbs is a 14 y/o M with a PMHx of myelodysplastic syndrome, alzheimer's dementia, and hypothyroidism presented to the ED today with his wife for decreased cognition over the past 3-4 days that acutely worsened over the past day. His wife noted decreased speech,conversation and ability to perform his ADL's over the past week. She also noted an event where the patient was playing cards and was able to pick one up off the ground due to his profuse shaking in his hands.   She notes a drastic change in his baseline last night when the patient wasn't sure of where he was or who his wife was. They then went to bed and the his wife noticed the patient trying to get out of bed. She helped him out of the bed, assuming he had to urinate but was unable to relay this information. She endorsed that he walked to the bathroom and when he came back to bed, his underwear was soaked in urine. They both got back into bed and at around 0300 the patient's wife heard a noise and the patient had rolled out of bed. At first she noted that his limbs were limp, but she eventually was able to help him back into the bed. This morning, the patient had slurred speech and was unable to ambulate per his baseline. At this point, his wife brought him to the ED.   Per his wife, Shaun Hobbs baseline is playing card games, computer games, football games, and  conversant. He was able to complete ADLs before past few days and has had decrease in . He does have a 20 year history of "forgetfulness"   Patient's wife denies recent illness, denies him complaining of chest pain, abdominal pain, difficulty passing his bowels or urinating. She notes he has had recent dried blood in his ears and mouth attributed to his severe thrombocytopenia.  ED Course: CT Scan revealed subdural hematoma. Hematology recommended 2 units of platelets. Neurosurgery do not believe patient is amendable to surgical intervention. Started on vancomycin, flagyl and cefepime.  Consults placed to hematology, neurosurgery and palliative.   Meds:  Current Meds  Medication Sig  . Cholecalciferol (VITAMIN D3 PO) Take 1,000 Units by mouth daily.   Marland Kitchen donepezil (ARICEPT) 10 MG tablet Take 10 mg by mouth daily.  Marland Kitchen levothyroxine (SYNTHROID, LEVOTHROID) 100 MCG tablet Take 100 mcg by mouth daily before breakfast.  . tamsulosin (FLOMAX) 0.4 MG CAPS capsule Take 0.4 mg by mouth at bedtime.  . tranexamic acid (LYSTEDA) 650 MG TABS tablet Take 1 tablet (650 mg total) by mouth 2 (two) times daily.  Marland Kitchen triamcinolone lotion (KENALOG) 0.1 % Apply 1 application topically 3 (three) times daily.    Social:  Social  History   Socioeconomic History  . Marital status: Married    Spouse name: Not on file  . Number of children: Not on file  . Years of education: Not on file  . Highest education level: Not on file  Occupational History  . Not on file  Tobacco Use  . Smoking status: Former Smoker    Quit date: 1981    Years since quitting: 41.1  . Smokeless tobacco: Never Used  Vaping Use  . Vaping Use: Never used  Substance and Sexual Activity  . Alcohol use: Yes  . Drug use: Never  . Sexual activity: Not Currently  Other Topics Concern  . Not on file  Social History Narrative  . Not on file   Social Determinants of Health   Financial Resource Strain: Not on file  Food Insecurity: Not on  file  Transportation Needs: Not on file  Physical Activity: Not on file  Stress: Not on file  Social Connections: Not on file  Intimate Partner Violence: Not on file   Allergies: Allergies as of 03/08/2020 - Review Complete 03/08/2020  Allergen Reaction Noted  . Sulfa antibiotics Other (See Comments) 04/25/2018   Past Medical History:  Diagnosis Date  . Anemia   . Cancer ([place]) 2008   Myelodysplastic syndrome  . Dementia ([place])   . Hypothyroidism    Review of Systems: A complete ROS was negative except as per HPI.   Physical Exam: Blood pressure 122/80, pulse 84, temperature (!) 102.9 F (39.4 C), temperature source Oral, resp. rate (!) 24, SpO2 98 %.   Constitutional: ill appearing, pulling at lines HENT: normocephalic atraumatic, dried blood in mouth Eyes: conjunctiva non-erythematous, PEERL Neck: supple Cardiovascular: irregularly irregular. No murmurs,gallops, rubs. JVD base of neck.  Pulmonary/Chest: normal work of breathing on room air, lungs clear to auscultation bilaterally Abdominal: soft, non-distended MSK: Spontaneous movements Neurological: Eyes open spontaneously. Unable to follow commands. Non-verbal.  Skin: Ecchymosis to the right face.  Psych: calm   Labs: CBC    Component Value Date/Time   WBC 4.1 03/08/2020 0925   RBC 2.29 (L) 03/08/2020 0925   HGB 7.4 (L) 03/08/2020 0925   HGB 8.0 (L) 02/25/2020 1107   HCT 22.9 (L) 03/08/2020 0925   PLT <5 (LL) 03/08/2020 0925   PLT <5 (LL) 02/25/2020 1107   MCV 100.0 03/08/2020 0925   MCH 32.3 03/08/2020 0925   MCHC 32.3 03/08/2020 0925   RDW 19.9 (H) 03/08/2020 0925   LYMPHSABS 1.0 03/08/2020 0925   MONOABS 0.5 03/08/2020 0925   EOSABS 0.0 03/08/2020 0925   BASOSABS 0.0 03/08/2020 0925    CMP     Component Value Date/Time   NA 128 (L) 03/08/2020 0925   K 4.1 03/08/2020 0925   CL 97 (L) 03/08/2020 0925   CO2 21 (L) 03/08/2020 0925   GLUCOSE 122 (H) 03/08/2020 0925   BUN 18 03/08/2020 0925    CREATININE 0.98 03/08/2020 0925   CREATININE 0.89 03/26/2019 1107   CALCIUM 8.4 (L) 03/08/2020 0925   PROT 6.7 03/08/2020 0925   ALBUMIN 3.7 03/08/2020 0925   AST 15 03/08/2020 0925   AST 14 (L) 03/26/2019 1107   ALT 9 03/08/2020 0925   ALT 9 03/26/2019 1107   ALKPHOS 56 03/08/2020 0925   BILITOT 1.0 03/08/2020 0925   BILITOT 0.4 03/26/2019 1107   GFRNONAA >60 03/08/2020 0925   GFRNONAA >60 03/26/2019 1107   GFRAA >60 03/26/2019 1107   Imaging: CT Head Wo Contrast  Result Date:  03/08/2020 CLINICAL DATA:  Mental status changes of unknown cause EXAM: CT HEAD WITHOUT CONTRAST TECHNIQUE: Contiguous axial images were obtained from the base of the skull through the vertex without intravenous contrast. Sagittal and coronal MPR images reconstructed from axial data set. COMPARISON:  None FINDINGS: Brain: Generalized atrophy. Normal ventricular morphology. No midline shift or mass effect. Small subdural hematoma identified along the LEFT tentorium, extending onto LEFT falx and anteriorly into the medial aspect of the LEFT middle cranial fossa. This measures up to 4 mm maximal thickness. Additional small amount of subdural blood is seen overlying the LEFT occipital lobe posteriorly. No evidence of acute infarction or mass lesion. No significant mass effect. Vascular: No hyperdense vessels. Atherosclerotic calcification of internal carotid arteries bilaterally at skull base Skull: Intact Sinuses/Orbits: Clear Other: N/A IMPRESSION: Small subdural hematoma along the LEFT tentorium, extending onto LEFT falx and anteriorly into the medial aspect of the LEFT middle cranial fossa as well as extending into the LEFT occipital region, measuring up to 4 mm thick. Additional small amount of subdural blood overlying the LEFT occipital lobe posteriorly. No significant mass effect or midline shift. Atrophy with small vessel chronic ischemic changes of deep cerebral white matter. No acute intraparenchymal abnormalities  identified. Critical Value/emergent results were called by telephone at the time of interpretation on 03/08/2020 at 11:08 am to provider DR. ANKIT NANAVATI , who verbally acknowledged these results. Electronically Signed   By: Lavonia Dana M.D.   On: 03/08/2020 11:08   DG Chest Portable 1 View  Result Date: 03/08/2020 CLINICAL DATA:  Altered mental status, fever, history dementia EXAM: PORTABLE CHEST 1 VIEW COMPARISON:  Portable exam 0923 hours compared to 12/12/2018 FINDINGS: Normal heart size, mediastinal contours, and pulmonary vascularity. Atherosclerotic calcification aorta. Lungs clear. No acute infiltrate, pleural effusion, or pneumothorax. Bones demineralized. IMPRESSION: No acute abnormalities. Aortic Atherosclerosis (ICD10-I70.0). Electronically Signed   By: Lavonia Dana M.D.   On: 03/08/2020 09:33    EKG: personally reviewed my interpretation is normal sinus rhythm of 80, LAD. Repeat EKG ordered, upon my exam patient with irregular rhythm.   Assessment & Plan by Problem: Active Problems:   Subdural hematoma (HCC)   MORT ESTOCK is a 87 y.o. with pertinent PMH of myelodysplastic syndrome, alzheimer's dementia, and hypothyroidism  who presented with altered mental status and admit for altered mental status, subdural hematoma on hospital day 0  Altered Mental Status Patient with change in mental status over past few days, acutely worsened last 24 hours. Unclear etiology at this time, possibly secondary to subdural hematoma's. Other possible etiologies are infectious (encephalitis) or metabolic process. Patient with white count of 4, baseline appears to be 2-3. He was with fever of 102.9. Fever possible secondary to centrally acting process vs infection. Chest xray negative for pneumonia. Urinalysis pending. Consider viral encephalitis in setting of immunocompromised state. Possible epileptogenic process in setting of hematoma and shaking episode. -overnight EEG per neurosurgery.  -plan for  hematoma as per below -pending blood cultures, urinalysis/urine cultures -continue broad spectrum antibiotics, vanc/cefepime until cultures result -consider csf studies if no improvement -neuro checks q2h  Left Sided Subdural Hematoma Patient with subdural hematoma, minimal brain compression, no midline shift. Neurosurgery consulted, do not believe surgical intervention is warranted at this time. Suspect spontaneous hematoma in setting of thrombocytopenia. Possible etiology of patient's acute cognitive changes as per above. Patient without focal area of ecchymosis or injury to the head, do not suspect hematoma's from patient rolling off of bed. -no surgical  intervention at this time -thrombocytopenia plan as per below -EEG this evening -neurosurgery signing off, unless eeg abnormal  Myelodysplastic syndrome Thrombocytopenia Patient diagnosed with MDS 2017. Most recent biopsy 11/19, revealing 30% cellularity with trilineage hematopoiesis. He is followed by heme/onc at [facility]. He currently receives platelets every 2 weeks as needed. Consulted by ED for platelets under 5. Recommended ordering 2 units of platelets and they will see the patient tomorrow.  -2 units of platelets -will hold off on lysteda until evaluated by SLP  Alzheimer Dementia -holding aricept until further evaluated by SLP  Irregular Rhythm Patient with irregular rhythm on my exam. Normal sinus rhythm on initial EKG. Repeat pending.   Goals of Care -Palliative consulted, patient is currently DNR  Diet: NPO VTE: None IVF: None,None Code: DNR  Prior to Admission Living Arrangement: Home, living with wife Anticipated Discharge Location: Uncertain home vs facility Barriers to Discharge: Further work up  Dispo: Admit patient to Inpatient with expected length of stay greater than 2 midnights.  Signed: Riesa Pope, MD Internal Medicine Resident PGY-1 Pager: [phone]  03/08/2020, 3:06 PM

## 2020-03-08 NOTE — ED Provider Notes (Signed)
[facility] EMERGENCY DEPARTMENT Provider Note   CSN: [id number] Arrival date & time: 03/08/20  4332     History Chief Complaint  Patient presents with  . AMS  . Weakness    Shaun Hobbs is a 87 y.o. male.  HPI     87 year old male comes in with chief complaint of weakness and confusion.  Level 5 caveat for altered mental status.  History provided primarily by patient's wife.  Patient has history of myelodysplastic syndrome, chronic ITP.  Patient receives bimonthly platelet and RBC transfusion.  According to the patient's wife, she started noticing a little bit of cognitive decline 2 weeks ago.  Patient was more confused than usual.  However, the last 24 hours she has noted that patient has significantly declined.  Patient was disoriented, did not know where he was, who the wife was.  This morning she noted that patient had slurred speech and could not get up from the bed and take a step -which prompted her to bring him into the ER.  There is no history of stroke.  Patient has had no sick contacts.  He is noted to have a low-grade fever, but wife denies any recent illnesses or recurrent infection in the past.  Past Medical History:  Diagnosis Date  . Anemia   . Cancer ([place]) 2008   Myelodysplastic syndrome  . Dementia ([place])   . Hypothyroidism     Patient Active Problem List   Diagnosis Date Noted  . Chronic ITP (idiopathic thrombocytopenia) (HCC) 10/24/2019  . Counseling regarding advance care planning and goals of care 10/24/2019  . Benign essential hypertension 02/26/2019  . Graves disease 02/26/2019  . History of lumbar laminectomy 02/26/2019  . Hypercholesterolemia 02/26/2019  . Lumbar back pain 02/26/2019  . Thrombocytopenia ([place]) 02/26/2019  . Pneumonia due to COVID-19 virus 11/25/2018  . Myelodysplasia (myelodysplastic syndrome) ([place]) 11/25/2018  . Dementia without behavioral disturbance ([place]) 11/25/2018  . Anemia with low platelet count  ([place]) 11/14/2018  . Benign prostatic hyperplasia 11/14/2018  . Memory loss 11/14/2018  . Advance care planning 01/12/2018  . Chemotherapy induced diarrhea 05/09/2017  . Early onset Alzheimer's dementia without behavioral disturbance ([place]) 05/09/2017  . IFG (impaired fasting glucose) 05/09/2017  . Bilateral impacted cerumen 04/15/2016  . Dermatitis 02/23/2016  . Bilateral carotid bruits 12/09/2015  . DNR (do not resuscitate) 12/09/2015  . Other microscopic hematuria 12/09/2015  . Hypothyroidism 12/08/2015  . Vitamin D deficiency 12/08/2015    History reviewed. No pertinent surgical history.     No family history on file.  Social History   Tobacco Use  . Smoking status: Former Smoker    Quit date: 1981    Years since quitting: 41.1  . Smokeless tobacco: Never Used  Vaping Use  . Vaping Use: Never used  Substance Use Topics  . Alcohol use: Yes  . Drug use: Never    Home Medications Prior to Admission medications   Medication Sig Start Date End Date Taking? Authorizing Provider  Cholecalciferol (VITAMIN D3 PO) Take 1,000 Units by mouth daily.     [provider]  donepezil (ARICEPT) 10 MG tablet Take 10 mg by mouth at bedtime.    [provider]  levothyroxine (SYNTHROID, LEVOTHROID) 100 MCG tablet Take 100 mcg by mouth daily before breakfast.    [provider]  MAGNESIUM PO Take 400 mg by mouth daily.     [provider]  memantine (NAMENDA) 10 MG tablet Take by mouth. 05/15/19  [provider]  mupirocin ointment (BACTROBAN) 2 % Apply 1 application topically 2 (two) times daily. To area of induration on forearm 03/12/19   Brunetta Genera, MD  sertraline (ZOLOFT) 100 MG tablet Take 100 mg by mouth at bedtime.    [provider]  tamsulosin (FLOMAX) 0.4 MG CAPS capsule Take 0.4 mg by mouth at bedtime.    [provider]  tranexamic acid (LYSTEDA) 650 MG TABS tablet Take 1 tablet (650 mg total) by mouth 2  (two) times daily. 12/23/19   Brunetta Genera, MD  triamcinolone lotion (KENALOG) 0.1 % Apply 1 application topically 3 (three) times daily. 02/25/20   Tanner, Lyndon Code., PA-C    Allergies    Sulfa antibiotics  Review of Systems   Review of Systems  Unable to perform ROS: Mental status change    Physical Exam Updated Vital Signs BP 133/63   Pulse 77   Temp (!) 101.4 F (38.6 C) (Oral)   Resp 19   SpO2 98%   Physical Exam Vitals and nursing note reviewed.  Constitutional:      Appearance: He is well-developed.     Comments: Somnolent  HENT:     Head: Atraumatic.  Eyes:     Pupils: Pupils are equal, round, and reactive to light.     Comments: 2 mm and equal  Cardiovascular:     Rate and Rhythm: Normal rate.  Pulmonary:     Effort: Pulmonary effort is normal.  Musculoskeletal:     Cervical back: Neck supple.  Skin:    General: Skin is warm.     Findings: Bruising and rash present.  Neurological:     Comments: Slurred speech, no facial asymmetry, patient is inconsistently following oral commands, no gaze abnormality, upper and lower extremity strength is 4+ out of 5, patient appears to be hypertonic over the right upper and lower extremity compared to left.     ED Results / Procedures / Treatments   Labs (all labs ordered are listed, but only abnormal results are displayed) Labs Reviewed  COMPREHENSIVE METABOLIC PANEL - Abnormal; Notable for the following components:      Result Value   Sodium 128 (*)    Chloride 97 (*)    CO2 21 (*)    Glucose, Bld 122 (*)    Calcium 8.4 (*)    All other components within normal limits  CBC WITH DIFFERENTIAL/PLATELET - Abnormal; Notable for the following components:   RBC 2.29 (*)    Hemoglobin 7.4 (*)    HCT 22.9 (*)    RDW 19.9 (*)    Platelets <5 (*)    nRBC 0.5 (*)    All other components within normal limits  SARS CORONAVIRUS 2 BY RT PCR (HOSPITAL ORDER, [place] [facility])  CULTURE, BLOOD  (ROUTINE X 2)  CULTURE, BLOOD (ROUTINE X 2)  LACTIC ACID, PLASMA  LACTIC ACID, PLASMA  URINALYSIS, ROUTINE W REFLEX MICROSCOPIC  APTT  PROTIME-INR  D-DIMER, QUANTITATIVE (NOT AT [facility])  FIBRINOGEN  TYPE AND SCREEN  PREPARE PLATELET PHERESIS    EKG None  Radiology CT Head Wo Contrast  Result Date: 03/08/2020 CLINICAL DATA:  Mental status changes of unknown cause EXAM: CT HEAD WITHOUT CONTRAST TECHNIQUE: Contiguous axial images were obtained from the base of the skull through the vertex without intravenous contrast. Sagittal and coronal MPR images reconstructed from axial data set. COMPARISON:  None FINDINGS: Brain: Generalized atrophy. Normal ventricular morphology. No midline shift or  mass effect. Small subdural hematoma identified along the LEFT tentorium, extending onto LEFT falx and anteriorly into the medial aspect of the LEFT middle cranial fossa. This measures up to 4 mm maximal thickness. Additional small amount of subdural blood is seen overlying the LEFT occipital lobe posteriorly. No evidence of acute infarction or mass lesion. No significant mass effect. Vascular: No hyperdense vessels. Atherosclerotic calcification of internal carotid arteries bilaterally at skull base Skull: Intact Sinuses/Orbits: Clear Other: N/A IMPRESSION: Small subdural hematoma along the LEFT tentorium, extending onto LEFT falx and anteriorly into the medial aspect of the LEFT middle cranial fossa as well as extending into the LEFT occipital region, measuring up to 4 mm thick. Additional small amount of subdural blood overlying the LEFT occipital lobe posteriorly. No significant mass effect or midline shift. Atrophy with small vessel chronic ischemic changes of deep cerebral white matter. No acute intraparenchymal abnormalities identified. Critical Value/emergent results were called by telephone at the time of interpretation on 03/08/2020 at 11:08 am to provider DR. Lior Cartelli , who verbally acknowledged these  results. Electronically Signed   By: Lavonia Dana M.D.   On: 03/08/2020 11:08   DG Chest Portable 1 View  Result Date: 03/08/2020 CLINICAL DATA:  Altered mental status, fever, history dementia EXAM: PORTABLE CHEST 1 VIEW COMPARISON:  Portable exam 0923 hours compared to 12/12/2018 FINDINGS: Normal heart size, mediastinal contours, and pulmonary vascularity. Atherosclerotic calcification aorta. Lungs clear. No acute infiltrate, pleural effusion, or pneumothorax. Bones demineralized. IMPRESSION: No acute abnormalities. Aortic Atherosclerosis (ICD10-I70.0). Electronically Signed   By: Lavonia Dana M.D.   On: 03/08/2020 09:33    Procedures .Critical Care Performed by: Varney Biles, MD Authorized by: Varney Biles, MD   Critical care provider statement:    Critical care time (minutes):  138   Critical care was necessary to treat or prevent imminent or life-threatening deterioration of the following conditions:  CNS failure or compromise   Critical care was time spent personally by me on the following activities:  Discussions with consultants, evaluation of patient's response to treatment, examination of patient, ordering and performing treatments and interventions, ordering and review of laboratory studies, ordering and review of radiographic studies, pulse oximetry, re-evaluation of patient's condition, obtaining history from patient or surrogate and review of old charts     Medications Ordered in ED Medications  lactated ringers infusion ( Intravenous New Bag/Given 03/08/20 1209)  ceFEPIme (MAXIPIME) 2 g in sodium chloride 0.9 % 100 mL IVPB (has no administration in time range)  metroNIDAZOLE (FLAGYL) IVPB 500 mg (500 mg Intravenous New Bag/Given 03/08/20 1208)  vancomycin (VANCOREADY) IVPB 1250 mg/250 mL (has no administration in time range)  ceFEPIme (MAXIPIME) 2 g in sodium chloride 0.9 % 100 mL IVPB (has no administration in time range)  vancomycin (VANCOREADY) IVPB 1250 mg/250 mL (has no  administration in time range)  0.9 %  sodium chloride infusion (has no administration in time range)  acetaminophen (TYLENOL) suppository 650 mg (has no administration in time range)    ED Course  I have reviewed the triage vital signs and the nursing notes.  Pertinent labs & imaging results that were available during my care of the patient were reviewed by me and considered in my medical decision making (see chart for details).    MDM Rules/Calculators/A&P                          87 year old comes in with chief complaint of altered mental  status.  Patient has history of MDS and frequently requires platelet and PRBC transfusion.  Patient is noted to be febrile.  He has had Covid in the past and is fully vaccinated.  Differential diagnosis includes meningitis, encephalitis, UTI, bacteremia, COVID-19 for the fever.  Patient has new AMS over the last 24 hours, rather sudden and severe -brain bleed is also in the differential.  Appropriate work-up initiated  Reassessment: Patient has subdural hematoma.  I discussed the case with hematology.  We discussed that patient has known history of chronic ITP, MDS and requires platelet transfusions.  He is febrile and altered.  Questioning if patient needs IVIG, steroids for ITP versus platelet transfusion for thrombocytopenia. Dr. Chryl Heck recommended that we give 2 units of platelets.  She is afraid that maintaining a platelet count of 50,000 would be difficult.  They will reassess the patient tomorrow to see if more platelet is indicated.  Reassessment: Results of the ER work-up including subdural hematoma finding discussed with patient's wife.  Neurosurgery consulted.  Dr. Venetia Constable will see the patient.  He does not think patient is amenable for surgical intervention. Neuro exam is unchanged.   Reassessment: Neurosurgery is recommending that we get EEG.  No role of neurology service at this time unless EEG shows abnormality.  Dr. Venetia Constable will  put in the EEG.  Internal medicine teaching service will admit the patient.   Reassessment: Rectal Tylenol ordered.  Every hour neurovascular checks into 6 ordered. Palliative service consult placed.  Family is amenable to having end-of-life conversation. Dr. Irene Limbo has been made aware of the finding - hopefully they will round on the patient tomorrow.   Currently, suspicion is high that patient's fever likely central and not infectious.  Deferring de-escalation of antibiotic to admitting service.  Final Clinical Impression(s) / ED Diagnoses Final diagnoses:  Subdural hematoma ([place])  Thrombocytopenia ([place])    Rx / DC Orders ED Discharge Orders    None       Varney Biles, MD 03/08/20 1324

## 2020-03-08 NOTE — Progress Notes (Signed)
EEG complete - results pending 

## 2020-03-08 NOTE — Consult Note (Signed)
Consultation Note Date: 03/08/2020   Patient Name: Shaun Hobbs  DOB: 1928/07/21  MRN: [id number]  Age / Sex: 87 y.o., male  PCP: Chesley Noon, MD Referring Physician: Lucious Groves, DO  Reason for Consultation: Establishing goals of care  HPI/Patient Profile: 87 y.o. male  with past medical history of myelodysplastic syndrome, Alzheimer's dementia, and hypothyroidism presented to the emergency department on 03/08/2020 with altered mental status. His wife reports decreased cognition over the past 3-4 days, decreased speech, and inability to perform ADLs. Per family report this is a sudden and drastic change from baseline.  ED Course: Platelets < 5. CT scan revealed subdural hematoma. Hematology recommended 2 units of platelets. Neurosurgery does not recommend surgical intervention. Patient admitted to Internal Medicine TS for further management.   Clinical Assessment and Goals of Care: I have reviewed medical records including EPIC notes, labs and imaging, examined the patient and discussed case with the ED provider. I met in the ED consult room with wife [name]), daughter [name]), and son (Whit)to discuss diagnosis, prognosis, GOC, EOL wishes, disposition, and options.  I introduced Palliative Medicine as specialized medical care for people living with serious illness. It focuses on providing relief from the symptoms and stress of a serious illness.   We discussed a brief life review of the patient. He served in [employer] for many years, which is what brought him to [facility]. He and [name] lived in [place] for most of their married life. They relocated to [facility] about 2 years ago to be near [place] and [place] who both live here.   As far as functional status, prior to the couple days leading to admission, patient was ambulatory and independent with ADLs. He was able to help with household chores. He  enjoyed going out to eat and taking trips with his family. Family emphasizes that his current condition is a drastic and sudden change from his baseline.    Discussion was had about his major underlying illnesses. Family shares that when he was initially diagnosed with myelodysplastic syndrome, they were informed that treatment is only temporary and that they "would know when it is time" to stop treatment. They also share that he was diagnosed with Alzheimer's dementia [facility] years ago at [facility]. We discussed of dementia and its natural trajectory, emphasizing that it is ultimately a terminal condition and can rapidly progress in some cases.     Discussed current plan of care from the medical team, which is to rule out any acute and reversible causes for patient's sudden decline. Family also wants to hear from patient's oncologist (Dr. Irene Limbo).     The difference between aggressive medical intervention and comfort care was considered. Discussed the concept of a comfort path with focus on quality of life and symptom management rather than prolonging life. Introduced hospice philosophy and provided information on home vs residential hospice services. Family indicates they are receptive to a comfort approach if there is no improvement and/or if his current state seems to be due  to a progressive disease process rather than a reversible issue. They would likely do home hospice.    Questions and concerns were addressed.  The family was provided with PMT contact info and encouraged to call with questions or concerns.   Primary decision maker: wife Garrison Michie with support from her children Kitty and [place]    DNR/DNI as previously documented  Continue current medical care  Watchful waiting for 48 hours  Family wants to speak with patient's oncologist Dr. Irene Limbo  Family seems open and receptive to comfort care and hospice if there is no improvement ir with further decline  PMT  will continue to follow  Code Status/Advance Care Planning:  DNR  Symptom Management:   Per primary team  Palliative Prophylaxis:   Oral Care and Turn Reposition, falls precautions  Additional Recommendations (Limitations, Scope, Preferences):  Full Scope Treatment  Psycho-social/Spiritual:   Created space and opportunity for patient and family to express thoughts and feelings regarding patient's current medical situation.   Emotional support provided   Prognosis:   < 6 months  Discharge Planning: To Be Determined      Primary Diagnoses: Present on Admission: . Subdural hematoma ([place])   I have reviewed the medical record, interviewed the patient and family, and examined the patient. The following aspects are pertinent.  Past Medical History:  Diagnosis Date  . Anemia   . Cancer ([place]) 2008   Myelodysplastic syndrome  . Dementia ([place])   . Hypothyroidism     No family history on file. Scheduled Meds: . [START ON 03/09/2020] levETIRAcetam  750 mg Oral BID   Continuous Infusions: . sodium chloride Stopped (03/08/20 1557)  . ceFEPime (MAXIPIME) IV    . lactated ringers 150 mL/hr at 03/08/20 1209  . levETIRAcetam    . [START ON 03/09/2020] vancomycin     PRN Meds:.acetaminophen **OR** acetaminophen, polyethylene glycol Medications Prior to Admission:  Prior to Admission medications   Medication Sig Start Date End Date Taking? Authorizing Provider  Cholecalciferol (VITAMIN D3 PO) Take 1,000 Units by mouth daily.    Yes [provider]  donepezil (ARICEPT) 10 MG tablet Take 10 mg by mouth daily.   Yes [provider]  levothyroxine (SYNTHROID, LEVOTHROID) 100 MCG tablet Take 100 mcg by mouth daily before breakfast.   Yes [provider]  tamsulosin (FLOMAX) 0.4 MG CAPS capsule Take 0.4 mg by mouth at bedtime.   Yes [provider]  tranexamic acid (LYSTEDA) 650 MG TABS tablet Take 1 tablet (650 mg total) by mouth 2 (two)  times daily. 12/23/19  Yes Brunetta Genera, MD  triamcinolone lotion (KENALOG) 0.1 % Apply 1 application topically 3 (three) times daily. 02/25/20  Yes Tanner, Lyndon Code., PA-C  mupirocin ointment (BACTROBAN) 2 % Apply 1 application topically 2 (two) times daily. To area of induration on forearm Patient not taking: No sig reported 03/12/19   Brunetta Genera, MD   Allergies  Allergen Reactions  . Sulfa Antibiotics Other (See Comments)    Patient reports: reaction in past    Review of Systems  Unable to perform ROS: Dementia    Physical Exam Vitals reviewed.  Constitutional:      General: He is not in acute distress.    Appearance: He is ill-appearing.  Cardiovascular:     Rate and Rhythm: Normal rate.  Pulmonary:     Effort: Pulmonary effort is normal.  Neurological:     Mental Status: He is  alert.     Motor: Weakness present.     Comments: Speech is incoherent     Vital Signs: BP 134/62   Pulse 91   Temp 98.8 F (37.1 C) (Oral)   Resp (!) 24   SpO2 94%  Pain Scale: 0-10   Pain Score: 0-No pain   SpO2: SpO2: 94 % O2 Device:SpO2: 94 % O2 Flow Rate: .   IO: Intake/output summary:   Intake/Output Summary (Last 24 hours) at 03/08/2020 2033 Last data filed at 03/08/2020 2018 Gross per 24 hour  Intake 881 ml  Output --  Net 881 ml     Palliative Assessment/Data: PPS 20%     Time In: 19:00 Time Out: 20:11 Time Total: 71 minutes Greater than 50%  of this time was spent counseling and coordinating care related to the above assessment and plan.  Signed by: Lavena Bullion, NP   Please contact Palliative Medicine Team phone at [phone] for questions and concerns.  For individual provider: See Shea Evans

## 2020-03-08 NOTE — Procedures (Addendum)
Patient Name: Shaun Hobbs  MRN: [id number]  Epilepsy Attending: Lora Havens  Referring Physician/Provider: Dr Emelda Brothers Date: 03/08/2020 Duration: 22.56 mins  Patient history: 87 y.o. man w/ MDS, PLT of 5 p/w AMS. [place] personally reviewed, which shows L convexity mixed density SDH with minimal brain compression, no midline shift. Had an episode of aphasia and some R sided shaking that resolved. EEG to evaluate for seizure  Level of alertness: Awake  AEDs during EEG study: None  Technical aspects: This EEG study was done with scalp electrodes positioned according to the 10-20 International system of electrode placement. Electrical activity was acquired at a sampling rate of 500Hz  and reviewed with a high frequency filter of 70Hz  and a low frequency filter of 1Hz . EEG data were recorded continuously and digitally stored.   Description: No posterior dominant rhythm was seen. EEG showed continuous generalized 3 to 6 Hz theta-delta slowing. Lateralized periodic discharges were noted at 1Hz  in left hemisphere.  Hyperventilation and photic stimulation were not performed.     ABNORMALITY -Lateralized periodic discharges, left hemisphere -Continuous slow, generalized  IMPRESSION: This study showed evidence of epileptogenicity arising from left hemisphere due to underlying structural abnormality. Additionally there is moderate diffuse encephalopathy, nonspecific etiology. No seizures were seen throughout the recording.  Primary team was notified.  Lowery Paullin [name]

## 2020-03-09 ENCOUNTER — Telehealth: Payer: Self-pay

## 2020-03-09 DIAGNOSIS — R569 Unspecified convulsions: Secondary | ICD-10-CM | POA: Diagnosis not present

## 2020-03-09 DIAGNOSIS — I6200 Nontraumatic subdural hemorrhage, unspecified: Principal | ICD-10-CM

## 2020-03-09 DIAGNOSIS — F0390 Unspecified dementia without behavioral disturbance: Secondary | ICD-10-CM

## 2020-03-09 DIAGNOSIS — R4182 Altered mental status, unspecified: Secondary | ICD-10-CM

## 2020-03-09 DIAGNOSIS — S065X9A Traumatic subdural hemorrhage with loss of consciousness of unspecified duration, initial encounter: Secondary | ICD-10-CM | POA: Diagnosis not present

## 2020-03-09 LAB — PREPARE PLATELET PHERESIS
Unit division: 0
Unit division: 0

## 2020-03-09 LAB — COMPREHENSIVE METABOLIC PANEL
ALT: 9 U/L (ref 0–44)
AST: 17 U/L (ref 15–41)
Albumin: 3.4 g/dL — ABNORMAL LOW (ref 3.5–5.0)
Alkaline Phosphatase: 44 U/L (ref 38–126)
Anion gap: 10 (ref 5–15)
BUN: 17 mg/dL (ref 8–23)
CO2: 20 mmol/L — ABNORMAL LOW (ref 22–32)
Calcium: 8.4 mg/dL — ABNORMAL LOW (ref 8.9–10.3)
Chloride: 99 mmol/L (ref 98–111)
Creatinine, Ser: 0.91 mg/dL (ref 0.61–1.24)
GFR, Estimated: >60 mL/min (ref >60)
Glucose, Bld: 109 mg/dL — ABNORMAL HIGH (ref 70–99)
Potassium: 3.8 mmol/L (ref 3.5–5.1)
Sodium: 129 mmol/L — ABNORMAL LOW (ref 135–145)
Total Bilirubin: 1.1 mg/dL (ref 0.3–1.2)
Total Protein: 6.2 g/dL — ABNORMAL LOW (ref 6.5–8.1)

## 2020-03-09 LAB — PROTIME-INR
INR: 1.5 — ABNORMAL HIGH (ref 0.8–1.2)
Prothrombin Time: 17.3 seconds — ABNORMAL HIGH (ref 11.4–15.2)

## 2020-03-09 LAB — CBC
HCT: 21.5 % — ABNORMAL LOW (ref 39.0–52.0)
HCT: 26.1 % — ABNORMAL LOW (ref 39.0–52.0)
Hemoglobin: 6.8 g/dL — CL (ref 13.0–17.0)
Hemoglobin: 8.5 g/dL — ABNORMAL LOW (ref 13.0–17.0)
MCH: 31.9 pg (ref 26.0–34.0)
MCH: 31.7 pg (ref 26.0–34.0)
MCHC: 31.6 g/dL (ref 30.0–36.0)
MCHC: 32.6 g/dL (ref 30.0–36.0)
MCV: 100.9 fL — ABNORMAL HIGH (ref 80.0–100.0)
MCV: 97.4 fL (ref 80.0–100.0)
Platelets: 35 10*3/uL — ABNORMAL LOW (ref 150–400)
Platelets: 27 10*3/uL — CL (ref 150–400)
RBC: 2.13 MIL/uL — ABNORMAL LOW (ref 4.22–5.81)
RBC: 2.68 MIL/uL — ABNORMAL LOW (ref 4.22–5.81)
RDW: 19.8 % — ABNORMAL HIGH (ref 11.5–15.5)
RDW: 18.2 % — ABNORMAL HIGH (ref 11.5–15.5)
WBC: 5.4 10*3/uL (ref 4.0–10.5)
WBC: 5.6 10*3/uL (ref 4.0–10.5)
nRBC: 0.4 % — ABNORMAL HIGH (ref 0.0–0.2)
nRBC: 0 % (ref 0.0–0.2)

## 2020-03-09 LAB — BPAM PLATELET PHERESIS
Blood Product Expiration Date: 202201302359
Blood Product Expiration Date: 202201302359
ISSUE DATE / TIME: 202201301409
ISSUE DATE / TIME: 202201301409
Unit Type and Rh: 6200
Unit Type and Rh: 6200

## 2020-03-09 LAB — APTT: aPTT: 37 seconds — ABNORMAL HIGH (ref 24–36)

## 2020-03-09 LAB — PREPARE RBC (CROSSMATCH)

## 2020-03-09 MED ORDER — SODIUM CHLORIDE 0.9 % IV SOLN
10.0000 mL/h | Freq: Once | INTRAVENOUS | Status: AC
  Administered 2020-03-09: 10 mL/h via INTRAVENOUS

## 2020-03-09 MED ORDER — SODIUM CHLORIDE 0.9 % IV SOLN
750.0000 mg | Freq: Two times a day (BID) | INTRAVENOUS | Status: DC
  Administered 2020-03-10 – 2020-03-13 (×7): 750 mg via INTRAVENOUS
  Filled 2020-03-09 (×10): qty 7.5

## 2020-03-09 MED ORDER — ORAL CARE MOUTH RINSE
15.0000 mL | Freq: Two times a day (BID) | OROMUCOSAL | Status: DC
  Administered 2020-03-09 – 2020-03-18 (×16): 15 mL via OROMUCOSAL

## 2020-03-09 NOTE — Progress Notes (Signed)
Patient currently with a temp of 102.3. Just gave Prn tylenol suppository. Will page MD to make aware.

## 2020-03-09 NOTE — Progress Notes (Signed)
HD#1 Subjective:  Overnight Events: Hbg of 6.8  Patient being transported from ED to floor. Patient's wife and daughter at bedside. Wife states no improvement of patient's cognitive function.   1500 Patient re-evaluated. Responding to commands, tracking well. Patient with verbal responses to questions. Patient shakes head when asking if he is in any pain.  Objective:  Vital signs in last 24 hours: Vitals:   03/09/20 0912 03/09/20 0934 03/09/20 1147 03/09/20 1610  BP: 110/60 (!) 119/55 125/70 (!) 125/44  Pulse: 78 73 77 69  Resp: '16 16  18  ' Temp:  98.2 F (36.8 C) 98.4 F (36.9 C) 100.2 F (37.9 C)  TempSrc:  Oral Oral Oral  SpO2: 95% 99% 100% 99%   Supplemental O2: Room Air SpO2: 99 %   Physical Exam:  Constitutional: Laying in bed, resting HENT: normocephalic atraumatic Eyes: tracking well Neck: supple Cardiovascular: regular rate and rhythm Pulmonary/Chest: normal work of breathing on room air Abdominal: non-distended MSK: normal bulk and tone Neurological: One word responses. Able to respond to simple commands: raise his thumb. Show two fingers. Able to state who his wife is.  Skin: warm and dry  There were no vitals filed for this visit.   Intake/Output Summary (Last 24 hours) at 03/09/2020 1631 Last data filed at 03/08/2020 2201 Gross per 24 hour  Intake 981 ml  Output -  Net 981 ml   Net IO Since Admission: 981 mL [03/09/20 1631]  Pertinent Labs: CBC Latest Ref Rng & Units 03/09/2020 03/09/2020 03/08/2020  WBC 4.0 - 10.5 K/uL 5.6 5.4 4.1  Hemoglobin 13.0 - 17.0 g/dL 8.5(L) 6.8(LL) 7.4(L)  Hematocrit 39.0 - 52.0 % 26.1(L) 21.5(L) 22.9(L)  Platelets 150 - 400 K/uL 27(LL) 35(L) <5(LL)    CMP Latest Ref Rng & Units 03/09/2020 03/08/2020 03/26/2019  Glucose 70 - 99 mg/dL 109(H) 122(H) 100(H)  BUN 8 - 23 mg/dL 17 18 25(H)  Creatinine 0.61 - 1.24 mg/dL 0.91 0.98 0.89  Sodium 135 - 145 mmol/L 129(L) 128(L) 140  Potassium 3.5 - 5.1 mmol/L 3.8 4.1 4.4   Chloride 98 - 111 mmol/L 99 97(L) 109  CO2 22 - 32 mmol/L 20(L) 21(L) 26  Calcium 8.9 - 10.3 mg/dL 8.4(L) 8.4(L) 8.5(L)  Total Protein 6.5 - 8.1 g/dL 6.2(L) 6.7 6.5  Total Bilirubin 0.3 - 1.2 mg/dL 1.1 1.0 0.4  Alkaline Phos 38 - 126 U/L 44 56 60  AST 15 - 41 U/L 17 15 14(L)  ALT 0 - 44 U/L '9 9 9    ' Imaging: EEG adult  Result Date: 03/08/2020 Lora Havens, MD     03/08/2020  8:11 PM Patient Name: HAWK MONES MRN: [id number] Epilepsy Attending: Lora Havens Referring Physician/Provider: Dr Emelda Brothers Date: 03/08/2020 Duration: 22.56 mins Patient history: 87 y.o. man w/ MDS, PLT of 5 p/w AMS. [place] personally reviewed, which shows L convexity mixed density SDH with minimal brain compression, no midline shift. Had an episode of aphasia and some R sided shaking that resolved. EEG to evaluate for seizure Level of alertness: Awake AEDs during EEG study: None Technical aspects: This EEG study was done with scalp electrodes positioned according to the 10-20 International system of electrode placement. Electrical activity was acquired at a sampling rate of '500Hz'  and reviewed with a high frequency filter of '70Hz'  and a low frequency filter of '1Hz' . EEG data were recorded continuously and digitally stored. Description: No posterior dominant rhythm was seen. EEG showed continuous generalized 3 to 6 Hz  theta-delta slowing. Lateralized periodic discharges were noted at '1Hz'  in left hemisphere.  Hyperventilation and photic stimulation were not performed.   ABNORMALITY -Lateralized periodic discharges, left hemisphere -Continuous slow, generalized IMPRESSION: This study showed evidence of epileptogenicity arising from left hemisphere due to underlying structural abnormality. Additionally there is moderate diffuse encephalopathy, nonspecific etiology. No seizures were seen throughout the recording. Primary team was notified. Priyanka [name]    Assessment/Plan:   Active Problems:   Myelodysplasia  (myelodysplastic syndrome) (HCC)   Dementia without behavioral disturbance ([place])   Thrombocytopenia (HCC)   Subdural hematoma (HCC)   Patient Summary:  Shaun Hobbs is a 87 y.o. with pertinent PMH of myelodysplastic syndrome, alzheimer's dementia, and hypothyroidism  who presented with altered mental status and admit for altered mental status, subdural hematoma on hospital day 0  Altered Mental Status  Abnormal EEG Patient with improvement of altered mental status after starting keppra yesterday evening. epileptogenicity of left hemisphere seen on EEG. Able to respond to simple commands and answers questions with one word answers. Appears AMS secondary to hematoma causing electrophysiologic changes. Suspect patient will continue to improve with anti-epileptics and if bleeding stable. Fever may be secondary to centrally acting process. Consider stopping antibiotics.  -hematoma management as per below -continue EEG monitoring per neurology -patient NPO will transition keppra to IV -BC no growth day 1, Urinalysis negative for nitrites and leukocytes -consider stopping abx if BC continue to have no growth. Vanc/Cefepime day 2 -consider csf studies if no improvement -neuro checks q2h  Left Sided Subdural Hematoma 2/2 thrombocytopenia Patient improving as per above, do not suspect worsening of hematoma. Neurosurgery consulted, do not believe surgical intervention is warranted at this time. Suspected to be spontaneous hematoma secondary to thrombocytopenia.  -continue to monitor for acute neurological changes -no surgical intervention at this time -thrombocytopenia plan as per below -neurosurgery signing off  Myelodysplastic syndrome Thrombocytopenia 2 units of platelets received yesterday. Per oncology, platelet goal above 20. Oncology consulted. Reached out to patient's oncologist, Dr. Irene Limbo to let him know the patient was here. Patient's wife also reached out to Dr. Grier Mitts office.   -pending oncology recomedations -daily CBC -restart lysteda when no longer NPO  Alzheimer Dementia -holding aricept until further evaluated by SLP  Irregular Rhythm Patient with irregular rhythm on my exam. EKG today NSR. NSR on my exam today.  -continue to montior  Goals of Care -Palliative consulted, patient is currently DNR. Palliative met with family and they would like comfort care if patient is at new baseline, however, he has improvement in his cognitive function today.   Diet: NPO VTE: None IVF: None,None Code: DNR   Dispo: Anticipated discharge to home vs SNF in 3-4 days pending further work up.  Sanjuana Letters DO Internal Medicine Resident PGY-1 Pager [phone] Please contact the on call pager after 5 pm and on weekends at [phone].

## 2020-03-09 NOTE — Progress Notes (Signed)
Lab reports critical Platelet: 27 Call received at 11:42, MD notified at 11:44

## 2020-03-09 NOTE — Telephone Encounter (Signed)
Received call from patient's wife [name] wanting to inform Dr. Irene Limbo that patient is currently admitted at [facility] where he has received 2 units of Platelets and a unit of PRBC. Wife has requested appointments for Lab and Blood transfusion for tomorrow be cancelled since patient is currently on admission.  Appointments for 03/10/20 cancelled.

## 2020-03-09 NOTE — Progress Notes (Signed)
Neurosurgery Service Progress Note  Subjective: No acute events overnight   Objective: Vitals:   03/09/20 0800 03/09/20 0912 03/09/20 0934 03/09/20 1147  BP: (!) 108/52 110/60 (!) 119/55 125/70  Pulse: 76 78 73 77  Resp: 19 16 16    Temp: (!) 100.5 F (38.1 C)  98.2 F (36.8 C) 98.4 F (36.9 C)  TempSrc: Axillary  Oral Oral  SpO2: 95% 95% 99% 100%    Physical Exam: Eyes open to voice, PERRL, gaze neutral, makes eye contact, follows simple commands x4 without preference  Assessment & Plan: 87 y.o. man w/ MDS / severe thrombocytopenia w/ SDH. EEG showed L sided PLEDs, improving w/ AEDs.  -Pt's exam is improving, this along w/ EEG findings are c/w electrophysiologic cause of his symptoms. Does not need surgical intervention and would likely not be a candidate given age / dementia / severe thrombocytopenia. Will therefore sign off, please call with any concerns or questions.   Judith Part  03/09/20 2:33 PM

## 2020-03-09 NOTE — Progress Notes (Signed)
EEG maintenance complete. No skin breakdown at electrode site. FP1 P7 P3. Assisted moving patient w/ EEG equipment to 3W from ED without issues. Continue to monitor

## 2020-03-09 NOTE — Procedures (Signed)
Patient Name: Shaun Hobbs  MRN: [id number]  Epilepsy Attending: Lora Havens  Referring Physician/Provider: Dr Emelda Brothers Duration: 03/08/2020 1942 to 03/09/2020 1942  Patient history: 87 y.o.man w/ MDS, PLT of 5 p/w AMS.[place]ally reviewed, which shows L convexity mixed density SDH with minimal brain compression, no midline shift.Had an episode of aphasia and some R sided shaking that resolved. EEG to evaluate for seizure  Level of alertness: Awake, asleep  AEDs during EEG study: LEV  Technical aspects: This EEG study was done with scalp electrodes positioned according to the 10-20 International system of electrode placement. Electrical activity was acquired at a sampling rate of 500Hz  and reviewed with a high frequency filter of 70Hz  and a low frequency filter of 1Hz . EEG data were recorded continuously and digitally stored.   Description: No posterior dominant rhythm was seen. Sleep was characterized by sleep spindles (12 to 14 Hz), maximal frontocentral region.  EEG showed continuous generalized 3 to 6 Hz theta-delta slowing.  EEG initially showed lateralized periodic discharges at 1Hz  in left hemisphere.   Gradually EEG showed generalized periodic discharges with triphasic morphology at 1-1.5 Hz when patient was stimulated. Hyperventilation and photic stimulation were not performed.     ABNORMALITY -Lateralized periodic discharges, left hemisphere -Generalized periodic discharges with triphasic morphology, generalized -Continuous slow, generalized  IMPRESSION: This study initially showed evidence of epileptogenicity arising from left hemisphere due to underlying structural abnormality. Gradually EEG showed generalized periodic discharges with triphasic morphology when patient was stimulated which is on the ictal-interictal continuum.  Additionally there is moderate diffuse encephalopathy, nonspecific etiology. No definite seizures were seen throughout the  recording.  [name] [name]

## 2020-03-10 ENCOUNTER — Inpatient Hospital Stay: Payer: Medicare Other

## 2020-03-10 ENCOUNTER — Other Ambulatory Visit: Payer: Self-pay

## 2020-03-10 DIAGNOSIS — S065X9A Traumatic subdural hemorrhage with loss of consciousness of unspecified duration, initial encounter: Secondary | ICD-10-CM | POA: Diagnosis not present

## 2020-03-10 DIAGNOSIS — R569 Unspecified convulsions: Secondary | ICD-10-CM | POA: Diagnosis not present

## 2020-03-10 LAB — BASIC METABOLIC PANEL
Anion gap: 11 (ref 5–15)
BUN: 23 mg/dL (ref 8–23)
CO2: 18 mmol/L — ABNORMAL LOW (ref 22–32)
Calcium: 8.2 mg/dL — ABNORMAL LOW (ref 8.9–10.3)
Chloride: 101 mmol/L (ref 98–111)
Creatinine, Ser: 1 mg/dL (ref 0.61–1.24)
GFR, Estimated: >60 mL/min (ref >60)
Glucose, Bld: 99 mg/dL (ref 70–99)
Potassium: 3.6 mmol/L (ref 3.5–5.1)
Sodium: 130 mmol/L — ABNORMAL LOW (ref 135–145)

## 2020-03-10 LAB — CBC WITH DIFFERENTIAL/PLATELET
Abs Immature Granulocytes: 0 10*3/uL (ref 0.00–0.07)
Basophils Absolute: 0 10*3/uL (ref 0.0–0.1)
Basophils Relative: 0 %
Eosinophils Absolute: 0 10*3/uL (ref 0.0–0.5)
Eosinophils Relative: 0 %
HCT: 25.7 % — ABNORMAL LOW (ref 39.0–52.0)
Hemoglobin: 8.3 g/dL — ABNORMAL LOW (ref 13.0–17.0)
Immature Granulocytes: 0 %
Lymphocytes Relative: 22 %
Lymphs Abs: 1 10*3/uL (ref 0.7–4.0)
MCH: 31.4 pg (ref 26.0–34.0)
MCHC: 32.3 g/dL (ref 30.0–36.0)
MCV: 97.3 fL (ref 80.0–100.0)
Monocytes Absolute: 0.8 10*3/uL (ref 0.1–1.0)
Monocytes Relative: 16 %
Neutro Abs: 3 10*3/uL (ref 1.7–7.7)
Neutrophils Relative %: 62 %
Platelets: 19 10*3/uL — CL (ref 150–400)
RBC: 2.64 MIL/uL — ABNORMAL LOW (ref 4.22–5.81)
RDW: 18.3 % — ABNORMAL HIGH (ref 11.5–15.5)
WBC: 4.8 10*3/uL (ref 4.0–10.5)
nRBC: 0 % (ref 0.0–0.2)

## 2020-03-10 LAB — CBC
HCT: 25.7 % — ABNORMAL LOW (ref 39.0–52.0)
Hemoglobin: 8.9 g/dL — ABNORMAL LOW (ref 13.0–17.0)
MCH: 33.2 pg (ref 26.0–34.0)
MCHC: 34.6 g/dL (ref 30.0–36.0)
MCV: 95.9 fL (ref 80.0–100.0)
Platelets: 20 10*3/uL — CL (ref 150–400)
RBC: 2.68 MIL/uL — ABNORMAL LOW (ref 4.22–5.81)
RDW: 18.3 % — ABNORMAL HIGH (ref 11.5–15.5)
WBC: 6 10*3/uL (ref 4.0–10.5)
nRBC: 0 % (ref 0.0–0.2)

## 2020-03-10 LAB — TSH: TSH: 4.152 u[IU]/mL (ref 0.350–4.500)

## 2020-03-10 LAB — PATHOLOGIST SMEAR REVIEW

## 2020-03-10 LAB — MAGNESIUM: Magnesium: 1.9 mg/dL (ref 1.7–2.4)

## 2020-03-10 LAB — OSMOLALITY: Osmolality: 281 mOsm/kg (ref 275–295)

## 2020-03-10 MED ORDER — POTASSIUM CHLORIDE 10 MEQ/100ML IV SOLN
10.0000 meq | INTRAVENOUS | Status: AC
  Administered 2020-03-11 (×4): 10 meq via INTRAVENOUS
  Filled 2020-03-10 (×4): qty 100

## 2020-03-10 MED ORDER — DONEPEZIL HCL 10 MG PO TABS
10.0000 mg | ORAL_TABLET | Freq: Every day | ORAL | Status: DC
  Administered 2020-03-10 – 2020-03-18 (×9): 10 mg via ORAL
  Filled 2020-03-10 (×9): qty 1

## 2020-03-10 MED ORDER — POTASSIUM CHLORIDE CRYS ER 20 MEQ PO TBCR: 40.0000 meq | EXTENDED_RELEASE_TABLET | Freq: Two times a day (BID) | ORAL | Status: DC

## 2020-03-10 MED ORDER — TRANEXAMIC ACID 650 MG PO TABS
650.0000 mg | ORAL_TABLET | Freq: Two times a day (BID) | ORAL | Status: DC
  Filled 2020-03-10 (×3): qty 1

## 2020-03-10 MED ORDER — POTASSIUM CHLORIDE CRYS ER 20 MEQ PO TBCR
40.0000 meq | EXTENDED_RELEASE_TABLET | Freq: Two times a day (BID) | ORAL | Status: DC
  Administered 2020-03-10: 40 meq via ORAL
  Filled 2020-03-10 (×2): qty 2

## 2020-03-10 MED ORDER — MAGNESIUM SULFATE 2 GM/50ML IV SOLN
2.0000 g | Freq: Once | INTRAVENOUS | Status: AC
  Administered 2020-03-10: 2 g via INTRAVENOUS
  Filled 2020-03-10: qty 50

## 2020-03-10 MED ORDER — CHLORHEXIDINE GLUCONATE 0.12 % MT SOLN
15.0000 mL | Freq: Two times a day (BID) | OROMUCOSAL | Status: DC
  Administered 2020-03-10 – 2020-03-18 (×17): 15 mL via OROMUCOSAL
  Filled 2020-03-10 (×17): qty 15

## 2020-03-10 MED ORDER — LEVOTHYROXINE SODIUM 100 MCG PO TABS
100.0000 ug | ORAL_TABLET | Freq: Every day | ORAL | Status: DC
  Administered 2020-03-11 – 2020-03-18 (×8): 100 ug via ORAL
  Filled 2020-03-10 (×8): qty 1

## 2020-03-10 NOTE — Procedures (Signed)
Patient Name:Shaun Hobbs ZOX:[id number] Epilepsy Attending:Jacolyn Joaquin [name] Referring Physician/Provider:Dr Emelda Brothers Duration:03/09/2020 1942 to 03/10/2020 1942  Patient history:87 y.o.man w/ MDS, PLT of 5 p/w AMS.CTHpersonally reviewed, which shows L convexity mixed density SDH with minimal brain compression, no midline shift.Had an episode ofaphasia and some R sided shaking that resolved.EEG to evaluate for seizure  Level of alertness:Awake, asleep  AEDs during EEG study:LEV  Technical aspects: This EEG study was done with scalp electrodes positioned according to the 10-20 International system of electrode placement. Electrical activity was acquired at a sampling rate of 500Hz  and reviewed with a high frequency filter of 70Hz  and a low frequency filter of 1Hz . EEG data were recorded continuously and digitally stored.   Description:Noposterior dominant rhythm was seen.Sleep was characterized by sleep spindles (12 to 14 Hz), maximal frontocentral region. EEG showed continuous generalized 3 to 6 Hz theta-delta slowing. Generalized periodic discharges with triphasic morphology at 1-1.5 Hz were also noted predominantly when patient was stimulated.Hyperventilation and photic stimulation were not performed.   ABNORMALITY -Generalized periodic discharges with triphasic morphology, generalized -Continuousslow, generalized  IMPRESSION: This study showed generalized periodic discharges with triphasic morphology when patient was stimulated which is on the ictal-interictal continuum.  Additionally there ismoderate diffuse encephalopathy, nonspecific etiology. No definite seizures were seen throughout the recording.  Mikyla Schachter [name]

## 2020-03-10 NOTE — Progress Notes (Addendum)
Paged by RN about HR in 130s-140s. Ordered EKG and telemetry. EKG read as A-flutter with variable AV block but this looks like wide complex irregular tachyarrhythmia. Went to see patient, he is asymptomatic (shakes head no to chest pain, palpitations, SHOB, lightheadedness/dizziness) although patient is nonverbal during this hospitalization. Will discuss with day team. Avoid beta-blocker just in case patient has AV block.

## 2020-03-10 NOTE — Progress Notes (Signed)
Patient has been more awake today, nodding appropriately to some questions. Ate 90% of his dinner, tolerated well. Was able to take medications crushed in applesauce.

## 2020-03-10 NOTE — Progress Notes (Signed)
LTM maintenance completed; reprepped Cz, C3, C4, F7, T7, P7, O1, and F4. No skin breakdown was seen under Fp1 and Fp2.

## 2020-03-10 NOTE — Progress Notes (Signed)
Marland Kitchen   HEMATOLOGY/ONCOLOGY INPATIENT PROGRESS NOTE  Date of Service: 03/10/2020  Inpatient Attending: .Lucious Groves, DO;Nare*   SUBJECTIVE  I saw Shaun Hobbs at the request of the inpatient team for his MDS chronic pancytopenia in the setting of being admitted for altered mental status and subdural hematoma. At the time of the visit his wife was at his bedside.  Shaun Hobbs is resting comfortably in bed.  Able to say hello and has some slow upper extremity movements. Patient was admitted with altered mental status and CT of the head showed-Small subdural hematoma along the LEFT tentorium, extending onto LEFT falx and anteriorly into the medial aspect of the LEFT middle cranial fossa as well as extending into the LEFT occipital region, measuring up to 4 mm thick. Additional small amount of subdural blood overlying the LEFT occipital lobe posteriorly. No significant mass effect or midline shift. Atrophy with small vessel chronic ischemic changes of deep cerebral white matter. No acute intraparenchymal abnormalities identified.  He was noted to have his chronic severe thrombocytopenia on admission as well as anemia with hemoglobin of 6.5.  Has been transfused PRBCs and platelets with improvement in his platelet numbers to the 20-30,000 range.  Wife also notes bleeding from his gums epistaxis and bleeding from his ears.  He has been seen by Dr. Venetia Constable from neurosurgery and deemed not to be a surgical candidate for his subdural hematomas.  There has also been concern for epileptogenic seizures as a additional cause of his altered mental status.  Neurology is following this and he was having continuous EEG monitoring at the time of my visit.  Patient has significant dementia at baseline and is dependent on his wife for most of his cares.  He has been able to recognize his wife previously and was primarily physical independent.  I had a long discussion with his wife and went over all the available  diagnostics had significant goals of care discussion.  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  He has continued to have fairly refractory thrombocytopenia and in the past has had clear goals to transfuse as needed every 2 weeks but has not desired more frequent visits for transfusions or additional burden of care.  Wife is aware that this hospitalization might reflect a fundamental change in his functional status depending on how he recovers from his seizure and bleed.  It is quite likely that despite antiepileptics that there could be delirium given his baseline of significant dementia.  We discussed that these functional status changes are likely to affect his ability to sustain himself nutritionally and with fluids and that goals of care will need to be defined further.  She is clear after discussion that she would not want [place] to attempt resuscitation and he has a DNR/DNI order in the system.  She would like to give it another 1 to 2 days to see how his mental status improves prior to finalizing a decision regarding comfort only cares and consideration of hospice.  She had several questions which were answered in details.  This is understandably hard for her but her faith holds her well.   OBJECTIVE:  NAD  PHYSICAL EXAMINATION: . Vitals:   03/10/20 0356 03/10/20 0400 03/10/20 0500 03/10/20 0756  BP: 129/83   115/63  Pulse:  (!) 137 88 79  Resp: _0 Temp: 98.3 F (36.8 C)   97.7 F (36.5 C)  TempSrc:  Oral   Oral  SpO2: 99%  98% 99%   There were no vitals filed for this visit. .There is no height or weight on file to calculate BMI.  GENERAL asleep resting in bed  SKIN: Appears pale EYES: Could not examine  OROPHARYNX: Dry blood over teeth and gums NECK: supple, no JVD LYMPH:  no palpable lymphadenopathy in the cervical, axillary or inguinal LUNGS: clear to auscultation with normal respiratory effort HEART: S1-S2 tachycardia   ABDOMEN: abdomen soft, non-tender, normoactive bowel sounds  NEURO: Patient sleeping and could not cooperate with detailed neurological examination  MEDICAL HISTORY:  Past Medical History:  Diagnosis Date  . Anemia   . Cancer ([place]) 2008   Myelodysplastic syndrome  . Dementia ([place])   . Hypothyroidism     SURGICAL HISTORY: History reviewed. No pertinent surgical history.  SOCIAL HISTORY: Social History   Socioeconomic History  . Marital status: Married    Spouse name: Not on file  . Number of children: Not on file  . Years of education: Not on file  . Highest education level: Not on file  Occupational History  . Not on file  Tobacco Use  . Smoking status: Former Smoker    Quit date: 1981    Years since quitting: 41.1  . Smokeless tobacco: Never Used  Vaping Use  . Vaping Use: Never used  Substance and Sexual Activity  . Alcohol use: Yes  . Drug use: Never  . Sexual activity: Not Currently  Other Topics Concern  . Not on file  Social History Narrative  . Not on file   Social Determinants of Health   Financial Resource Strain: Not on file  Food Insecurity: Not on file  Transportation Needs: Not on file  Physical Activity: Not on file  Stress: Not on file  Social Connections: Not on file  Intimate Partner Violence: Not on file    FAMILY HISTORY: No family history on file.  ALLERGIES:  is allergic to sulfa antibiotics.  MEDICATIONS:  Scheduled Meds: . mouth rinse  15 mL Mouth Rinse BID   Continuous Infusions: . sodium chloride Stopped (03/08/20 1557)  . levETIRAcetam 750 mg (03/10/20 0546)   PRN Meds:.acetaminophen **OR** acetaminophen, polyethylene glycol  REVIEW OF SYSTEMS:    10 Point review of Systems was done is negative except as noted above.   LABORATORY DATA:  I have reviewed the data as listed  . CBC Latest Ref Rng & Units 03/10/2020 03/10/2020 03/09/2020  WBC 4.0 - 10.5 K/uL 4.8 6.0 5.6  Hemoglobin 13.0 - 17.0 g/dL 8.3(L) 8.9(L) 8.5(L)   Hematocrit 39.0 - 52.0 % 25.7(L) 25.7(L) 26.1(L)  Platelets 150 - 400 K/uL 19(LL) 20(LL) 27(LL)    . CMP Latest Ref Rng & Units 03/10/2020 03/09/2020 03/08/2020  Glucose 70 - 99 mg/dL 99 109(H) 122(H)  BUN 8 - 23 mg/dL _0 Creatinine 0.61 - 1.24 mg/dL 1.00 0.91 0.98  Sodium 135 - 145 mmol/L 130(L) 129(L) 128(L)  Potassium 3.5 - 5.1 mmol/L 3.6 3.8 4.1  Chloride 98 - 111 mmol/L 101 99 97(L)  CO2 22 - 32 mmol/L 18(L) 20(L) 21(L)  Calcium 8.9 - 10.3 mg/dL 8.2(L) 8.4(L) 8.4(L)  Total Protein 6.5 - 8.1 g/dL - 6.2(L) 6.7  Total Bilirubin 0.3 - 1.2 mg/dL - 1.1 1.0  Alkaline Phos 38 - 126 U/L - 44 56  AST 15 - 41 U/L - 17 15  ALT 0 - 44 U/L - 9 9     RADIOGRAPHIC STUDIES: I  have personally reviewed the radiological images as listed and agreed with the findings in the report. CT Head Wo Contrast  Result Date: 03/08/2020 CLINICAL DATA:  Mental status changes of unknown cause EXAM: CT HEAD WITHOUT CONTRAST TECHNIQUE: Contiguous axial images were obtained from the base of the skull through the vertex without intravenous contrast. Sagittal and coronal MPR images reconstructed from axial data set. COMPARISON:  None FINDINGS: Brain: Generalized atrophy. Normal ventricular morphology. No midline shift or mass effect. Small subdural hematoma identified along the LEFT tentorium, extending onto LEFT falx and anteriorly into the medial aspect of the LEFT middle cranial fossa. This measures up to 4 mm maximal thickness. Additional small amount of subdural blood is seen overlying the LEFT occipital lobe posteriorly. No evidence of acute infarction or mass lesion. No significant mass effect. Vascular: No hyperdense vessels. Atherosclerotic calcification of internal carotid arteries bilaterally at skull base Skull: Intact Sinuses/Orbits: Clear Other: N/A IMPRESSION: Small subdural hematoma along the LEFT tentorium, extending onto LEFT falx and anteriorly into the medial aspect of the LEFT middle cranial fossa  as well as extending into the LEFT occipital region, measuring up to 4 mm thick. Additional small amount of subdural blood overlying the LEFT occipital lobe posteriorly. No significant mass effect or midline shift. Atrophy with small vessel chronic ischemic changes of deep cerebral white matter. No acute intraparenchymal abnormalities identified. Critical Value/emergent results were called by telephone at the time of interpretation on 03/08/2020 at 11:08 am to provider DR. ANKIT NANAVATI , who verbally acknowledged these results. Electronically Signed   By: Lavonia Dana M.D.   On: 03/08/2020 11:08   DG Chest Portable 1 View  Result Date: 03/08/2020 CLINICAL DATA:  Altered mental status, fever, history dementia EXAM: PORTABLE CHEST 1 VIEW COMPARISON:  Portable exam 0923 hours compared to 12/12/2018 FINDINGS: Normal heart size, mediastinal contours, and pulmonary vascularity. Atherosclerotic calcification aorta. Lungs clear. No acute infiltrate, pleural effusion, or pneumothorax. Bones demineralized. IMPRESSION: No acute abnormalities. Aortic Atherosclerosis (ICD10-I70.0). Electronically Signed   By: Lavonia Dana M.D.   On: 03/08/2020 09:33   EEG adult  Result Date: 03/08/2020 Lora Havens, MD     03/08/2020  8:11 PM Patient Name: Shaun Hobbs MRN: [id number] Epilepsy Attending: Lora Havens Referring Physician/Provider: Dr Emelda Brothers Date: 03/08/2020 Duration: 22.56 mins Patient history: 87 y.o. man w/ MDS, PLT of 5 p/w AMS. [place] personally reviewed, which shows L convexity mixed density SDH with minimal brain compression, no midline shift. Had an episode of aphasia and some R sided shaking that resolved. EEG to evaluate for seizure Level of alertness: Awake AEDs during EEG study: None Technical aspects: This EEG study was done with scalp electrodes positioned according to the 10-20 International system of electrode placement. Electrical activity was acquired at a sampling rate of $Remov'500Hz'nNYReC$  and reviewed  with a high frequency filter of $RemoveB'70Hz'EUxlabnx$  and a low frequency filter of $RemoveB'1Hz'eygeMjzR$ . EEG data were recorded continuously and digitally stored. Description: No posterior dominant rhythm was seen. EEG showed continuous generalized 3 to 6 Hz theta-delta slowing. Lateralized periodic discharges were noted at $Remove'1Hz'UCKFAFQ$  in left hemisphere.  Hyperventilation and photic stimulation were not performed.   ABNORMALITY -Lateralized periodic discharges, left hemisphere -Continuous slow, generalized IMPRESSION: This study showed evidence of epileptogenicity arising from left hemisphere due to underlying structural abnormality. Additionally there is moderate diffuse encephalopathy, nonspecific etiology. No seizures were seen throughout the recording. Primary team was notified. [place]    ASSESSMENT & PLAN:  87 y.o. male with:  1. 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  05/16/06 BM Bx indicated by pancytopenia but revealed no evidence of abnormal myeloid maturation or an increased blast population. No evidence for a lymphoproliferative disorder. Normal cytogenetics. 08/28/07 BM Bx revealed mild dyserythropoiesis and a suggestion of some dysplasia within the myeloid series. 04/17/12 BM Bx revealed hypercellular marrow with 50% cellularity, 1.5% blasts, with trilineage hematopoiesis, marrow monocytosis, mild myeloid and megakaryocytic dysplasia, adequate storage and iron utilization, no ring sideroblasts. Small monoclonal B lymphocytic population of uncertain significance. 11/19/15 BM Bx revealed Hypercellular marrow for age about 70% with panmyelosis including increased megakaryocytes with atypia, moderate marrow monocytosis about 15-20% and <5% blasts. Minute CD5 positive monoclonal B-cell population. Storage iron present. 12/27/17 BM Bx revealed normocellular marrow for age with 30% cellularity and trilineage  hematopoiesis, mild megakaryocytic hyperplasia with dyspoietic changes, mild erythroid hyperplasia with ringed sideroblasts, and minute population of monoclonal B cells detected by flow.  S/p 21 cycles of Vidaza completed in the week of November 30, 2017. Previous oncologic notes suggest that the patient's pancytopenia was continuing to worsen through [place] treatment.  2.  Severe pancytopenia related to his MDS.  He has been dependent on PRBCs and platelet transfusions for a long time excessively year.  3.  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  4.  Bleeding issues-patient is having extensive skin bruising epistaxis gum bleeding and subdural hematoma in the setting of severe thrombocytopenia.  5.  Symptomatic anemia status post PRBC transfusion PLAN  -He has been seen by Dr. Venetia Constable from neurosurgery and deemed not to be a surgical candidate for his subdural hematomas.  There has also been concern for epileptogenic seizures as a additional cause of his altered mental status.   -Neurology is following this and he was having continuous EEG monitoring at the time of my visit.  Patient has significant dementia at baseline and is dependent on his wife for most of his cares.  He has been able to recognize his wife previously and was primarily physical independent.  -I had a long discussion with his wife and went over all the available diagnostics had significant goals of care discussion.  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  He has continued to have fairly refractory thrombocytopenia and anemia and in the past has had clear goals to transfuse as needed every 2 weeks but has not desired more frequent visits for transfusions or additional burden of care.  -Wife is aware that this hospitalization might reflect a fundamental change in his functional status depending on how he recovers  from his seizure and bleed.  It is quite likely even despite antiepileptics that there could be delirium given his baseline of significant dementia.  We discussed that these functional status changes are likely to affect his ability to sustain himself nutritionally and with fluids and that goals of care will need to be defined further.  -She is clear after discussion that she would not want [place] to attempt resuscitation and he has a DNR/DNI order in the system.  -She would like to give it another 1 to 2 days to see how his mental status improves prior to finalizing a decision regarding comfort only cares and consideration of hospice.  -Palliative care is following to continue goals of care discussion.  -At this  time it is reasonable to continue Lysteda and transfuse the patient as needed for platelets less than 20k or if actively bleeding and for hemoglobin less than 7.  -Please call if any other questions arise from hematology standpoint.    I spent 45 minutes counseling the patient face to face. The total time spent in the appointment was 60 minutes and more than 50% was on counseling and direct patient cares.    Sullivan Lone MD Micro AAHIVMS [facility] Hematology/Oncology Physician [facility]  (Office):       [phone] (Work cell):  [phone] (Fax):           [phone]

## 2020-03-10 NOTE — Progress Notes (Addendum)
Subjective:   Hospital day: 1  Overnight event: Short episode of tachycardia rate 130-140. EKG showed possible MAT vs wide WRS complex. Patient was asymptomatic. He spontaneously converted back to sinus shortly after.   Patient is seen at bedside with daughter.  Patient appears in no acute distress.  He gave sign that he does not have any pain.  Dauger reports he is more awake this morning and responding to questions.  He is more alert and awake and able to follow simple commands.  Daughter states that patient is not back to baseline. At baseline, he is able to get out of bed and dress himself and walk to the end of the block, but eating less recently.  Discussed plan for SLP and continue antiepileptic medication today.   Objective:  Vital signs in last 24 hours: Vitals:   03/09/20 2329 03/10/20 0356 03/10/20 0400 03/10/20 0500  BP: 122/72 129/83    Pulse: 88  (!) 137 88  Resp: 18 18  18   Temp: 99 F (37.2 C) 98.3 F (36.8 C)    TempSrc: Axillary Oral    SpO2: 97% 99%  98%   CBC Latest Ref Rng & Units 03/10/2020 03/09/2020 03/09/2020  WBC 4.0 - 10.5 K/uL 6.0 5.6 5.4  Hemoglobin 13.0 - 17.0 g/dL 8.9(L) 8.5(L) 6.8(LL)  Hematocrit 39.0 - 52.0 % 25.7(L) 26.1(L) 21.5(L)  Platelets 150 - 400 K/uL 20(LL) 27(LL) 35(L)   CMP Latest Ref Rng & Units 03/10/2020 03/09/2020 03/08/2020  Glucose 70 - 99 mg/dL 99 109(H) 122(H)  BUN 8 - 23 mg/dL 23 17 18   Creatinine 0.61 - 1.24 mg/dL 1.00 0.91 0.98  Sodium 135 - 145 mmol/L 130(L) 129(L) 128(L)  Potassium 3.5 - 5.1 mmol/L 3.6 3.8 4.1  Chloride 98 - 111 mmol/L 101 99 97(L)  CO2 22 - 32 mmol/L 18(L) 20(L) 21(L)  Calcium 8.9 - 10.3 mg/dL 8.2(L) 8.4(L) 8.4(L)  Total Protein 6.5 - 8.1 g/dL - 6.2(L) 6.7  Total Bilirubin 0.3 - 1.2 mg/dL - 1.1 1.0  Alkaline Phos 38 - 126 U/L - 44 56  AST 15 - 41 U/L - 17 15  ALT 0 - 44 U/L - 9 9    Physical Exam Physical Exam Constitutional:      General: He is not in acute distress.    Appearance: He is  ill-appearing.  HENT:     Head: Normocephalic.  Eyes:     General:        Right eye: No discharge.        Left eye: No discharge.  Cardiovascular:     Rate and Rhythm: Normal rate.  Pulmonary:     Effort: Pulmonary effort is normal. No respiratory distress.  Musculoskeletal:     Right lower leg: No edema.     Left lower leg: No edema.  Neurological:     Mental Status: He is alert.     Comments: Able to follow simple command.  Still nonverbal  Psychiatric:        Mood and Affect: Mood normal.     Assessment/Plan: Shaun Hobbs is a 87 y.o. with pertinent PMH of myelodysplastic syndrome, alzheimer's dementia, and hypothyroidism  who presented with altered mental status, found to have a left-sided subdural hematoma.  Active Problems:   Myelodysplasia (myelodysplastic syndrome) (HCC)   Dementia without behavioral disturbance (HCC)   Thrombocytopenia (H[facility])   Subdural hematoma (H[facility])  Altered mental status-improving Left-sided subdural hematoma EEG shows epileptogenicity coming from the left side due  to subdural hematoma.  Patient was started on IV Keppra and continued to have improvement in mental status.  His subdural hematoma is likely spontaneous secondary to thrombocytopenia.  Patient is not a surgical candidate for his subdural hematoma per neurosurgery given his age and thrombocytopenia.  Will discontinue antibiotics given no source of infection.  His fever is likely due to centrally acting process.  We will continue to monitor his mental status as he is not back to baseline.  PT/OT to assess his functional status.  SLP to assess swallowing ability -Continue IV Keppra -No surgical intervention at this time.  Neurosurgery signed off -Monitor mental status -PT/OT evaluation -SLP evaluation -Palliative care was consulted.  Family is receptive to comfort care if there is no improvement.   Arrhythmia Short episode of tachycardia with irregular rhythm.  EKG shows possible MAT  versus wide QRS complex.  Patient was asymptomatic.  This is likely due to centrally acting process from right subdural hematoma.  Patient is spontaneously converted back to normal sinus rhythm on exam.  We will continue to monitor.  Keep K > 4 and Mag > 2.  -Pending Mag   Myelodysplastic syndrome Thrombocytopenia Baseline platelet of 5, improved to 2 units of platelets.  Heme/oncology was consulted and recommended keeping platelets above 20 -Appreciate oncology recommendation -We will restart Listeria if pass SLP   Hyponatremia Normal renal function.  Likely due to poor appetite in combination with centrally acting process. If pass SLP, will encourage PO intake  -Pending serum Osm, urine Osm, urine sodium  -Monitor BMP   Alzheimer -Restart Aricept if passes SLP  Diet: NPO. Pending SLP IVF: NA VTE: SCD CODE: DNR  Prior to Admission Living Arrangement: Home Anticipated Discharge Location: TBD Barriers to Discharge: Altered mental status  Gaylan Gerold, DO 03/10/2020, 6:06 AM Pager: [phone] After 5pm on weekdays and 1pm on weekends: On Call pager [phone]

## 2020-03-10 NOTE — Evaluation (Addendum)
Physical Therapy Evaluation Patient Details Name: Shaun Hobbs MRN: [id number] DOB: 1928/06/23 Today's Date: 03/10/2020   History of Present Illness  Shaun Hobbs is a 87 y.o. with pertinent PMH of myelodysplastic syndrome, alzheimer's dementia, and hypothyroidism  who presented with altered mental status, found to have a left-sided subdural hematoma.  Clinical Impression  Pt presented to the hospital with increased AMS. Wife states he has baseline Alzheimer's dementia but is able to perform functional mobility tasks without assist or AD. Wife states prior to today, pt has not been able to move. Pt performed bed mobility only with RN limiting Eval to bed level due to labs and need for EEG. Pt with difficulty following commands and requiring assist for supine>sit. Pt will continue to benefit from skilled PT to address deficits in balance, strength, coordination, endurance, gait and safety to maximize independence with functional mobility prior to discharge.     Follow Up Recommendations SNF vs Home health PT;Supervision/Assistance - 24 hour; pending progress with therapy    Equipment Recommendations       Recommendations for Other Services       Precautions / Restrictions Precautions Precautions: Fall;Other (comment) Precaution Comments: untied mits Restrictions Weight Bearing Restrictions: No      Mobility  Bed Mobility Overal bed mobility: Needs Assistance Bed Mobility: Supine to Sit;Sit to Supine;Rolling Rolling: Min assist   Supine to sit: Min assist Sit to supine: Supervision   General bed mobility comments: performed x 2; min A needed for rolling due to inability to follow commands    Transfers                    Ambulation/Gait                Stairs            Wheelchair Mobility    Modified Rankin (Stroke Patients Only) Pre-Morbid Rankin Score: 1 Modified Rankin: 4       Balance Overall balance assessment: Needs  assistance Sitting-balance support: Single extremity supported Sitting balance-Leahy Scale: Fair                                       Pertinent Vitals/Pain Pain Assessment: No/denies pain    Home Living Family/patient expects to be discharged to:: Private residence Living Arrangements: Spouse/significant other Available Help at Discharge: Available 24 hours/day Type of Home: House Home Access: Stairs to enter   [employer] of Steps: 1 Home Layout: One level Home Equipment: Walker - 2 wheels;Grab bars - toilet;Grab bars - tub/shower;Other (comment);Shower seat (walking stick) Additional Comments: pt with baseline dementia; wife states pt can get up and dressed and walk around house without assist. S needed when out of house. wife performs Nurse, learning disability and medicine management    Prior Function Level of Independence: Independent               Hand Dominance   Dominant Hand: Right    Extremity/Trunk Assessment   Upper Extremity Assessment Upper Extremity Assessment: Defer to OT evaluation    Lower Extremity Assessment Lower Extremity Assessment: Generalized weakness       Communication   Communication: HOH  Cognition Arousal/Alertness: Awake/alert Behavior During Therapy: Restless;Flat affect Overall Cognitive Status: Impaired/Different from baseline  General Comments: pt non verbal; pt able to respond to tactile cueing to lift foot and hands and wave. pt less reponsive to verbal cueing      General Comments General comments (skin integrity, edema, etc.): VSS on RA    Exercises     Assessment/Plan    PT Assessment Patient needs continued PT services  PT Problem List Decreased mobility;Decreased safety awareness;Decreased coordination;Decreased cognition;Decreased balance;Decreased activity tolerance       PT Treatment Interventions Therapeutic exercise;Gait training;Balance  training;Neuromuscular re-education;Functional mobility training;Therapeutic activities;Patient/family education    PT Goals (Current goals can be found in the Care Plan section)  Acute Rehab PT Goals Patient Stated Goal: non stated by pt. wife states for him to get up and move around PT Goal Formulation: With family Time For Goal Achievement: 03/24/20 Potential to Achieve Goals: Fair    Frequency Min 4X/week   Barriers to discharge        Co-evaluation               AM-PAC PT "6 Clicks" Mobility  Outcome Measure Help needed turning from your back to your side while in a flat bed without using bedrails?: A Little Help needed moving from lying on your back to sitting on the side of a flat bed without using bedrails?: A Little Help needed moving to and from a bed to a chair (including a wheelchair)?: A Lot Help needed standing up from a chair using your arms (e.g., wheelchair or bedside chair)?: A Lot Help needed to walk in hospital room?: Total Help needed climbing 3-5 steps with a railing? : Total 6 Click Score: 12    End of Session   Activity Tolerance: Patient tolerated treatment well Patient left: in bed;with call bell/phone within reach;with bed alarm set;with family/visitor present Nurse Communication: Mobility status PT Visit Diagnosis: Unsteadiness on feet (R26.81)    Time: 4496-7591 PT Time Calculation (min) (ACUTE ONLY): 26 min   Charges:   PT Evaluation $PT Eval Low Complexity: 1 Low PT Treatments $Therapeutic Activity: 8-22 mins        Lyanne Co, DPT Acute Rehabilitation Services [phone]  [place] 03/10/2020, 11:10 AM

## 2020-03-10 NOTE — Evaluation (Addendum)
Clinical/Bedside Swallow Evaluation Patient Details  Name: Shaun Hobbs MRN: [id number] Date of Birth: 1929/01/20  Today's Date: 03/10/2020 Time: SLP Start Time (ACUTE ONLY): 1150 SLP Stop Time (ACUTE ONLY): 1225 SLP Time Calculation (min) (ACUTE ONLY): 35 min  Past Medical History:  Past Medical History:  Diagnosis Date  . Anemia   . Cancer ([place]) 2008   Myelodysplastic syndrome  . Dementia ([place])   . Hypothyroidism    Past Surgical History: History reviewed. No pertinent surgical history. HPI:  Shaun Hobbs with a PMHx of myelodysplastic syndrome, alzheimer's dementia, and hypothyroidism presented to the ED today with his wife for decreased cognition over the past Shaun-4 days that acutely worsened over the past day. His wife noted decreased speech,conversation and ability to perform his ADL's over the past week. CT of head noted with Small subdural hematoma along the LEFT tentorium, extending onto LEFT falx and anteriorly into the medial aspect of the LEFT middle cranial fossa as well as extending into the LEFT occipital region, measuring up to 4 mm thick. Additional small amount of subdural blood overlying the LEFT occipital lobe posteriorly.   Assessment / Plan / Recommendation Clinical Impression  Pt arousable for completion of clinical swallow evaluation, pts spouse at bedside. Pt with signficant xerostomia and dried blood secretions in oral cavity, improving greatly s/p oral care by SLP. Additional oral care supplies left at bedside for spouse and staff. Pt opened eyes to command, and receptive to POs, nodding head to questions at times. No verbal communication elicited despite cues, supsect in setting of dementia. Pt with mild anterior spillage of thin liquids by cup at midline. Intermittent oral holding noted with thins via cup and straw (spouse reports he has done this at home as well inconsistently). Palpable swallow ellicited, suspected to be delayed. Pt with delayed cough x1 with thin  liquids. Puree textures were without difficulty. Pt with prolonged mastication and reduced bolus formation with solid POs as well as oral residuals. Recommend dysphagia 1 (puree) and thin liquids with medicines crushed in puree. Full supervision warranted with all PO. Hold PO if mentation is poor. Spouse and RN agreeable.  SLP Visit Diagnosis: Dysphagia, oral phase (R13.11);Dysphagia, unspecified (R13.10)    Aspiration Risk  Mild aspiration risk;Moderate aspiration risk    Diet Recommendation   Dysphagia 1 (puree) thin liquids   Medication Administration: Crushed with puree    Other  Recommendations Oral Care Recommendations: Oral care BID   Follow up Recommendations 24 hour supervision/assistance      Frequency and Duration min 2x/week  1 week       Prognosis Prognosis for Safe Diet Advancement: Fair Barriers to Reach Goals: Time post onset (comorbitites)      Swallow Study   General Date of Onset: 03/08/20 HPI: Shaun Hobbs with a PMHx of myelodysplastic syndrome, alzheimer's dementia, and hypothyroidism presented to the ED today with his wife for decreased cognition over the past Shaun-4 days that acutely worsened over the past day. His wife noted decreased speech,conversation and ability to perform his ADL's over the past week. CT of head noted with Small subdural hematoma along the LEFT tentorium, extending onto LEFT falx and anteriorly into the medial aspect of the LEFT middle cranial fossa as well as extending into the LEFT occipital region, measuring up to 4 mm thick. Additional small amount of subdural blood overlying the LEFT occipital lobe posteriorly. Type of Study: Bedside Swallow Evaluation Previous Swallow Assessment: none on file Diet Prior to this Study:  NPO Temperature Spikes Noted: No Respiratory Status: Room air History of Recent Intubation: No Behavior/Cognition: Requires cueing;Cooperative Oral Cavity Assessment: Dry Oral Care Completed by SLP: Yes Oral Cavity -  Dentition: Adequate natural dentition Vision: Functional for self-feeding Self-Feeding Abilities: Needs assist Baseline Vocal Quality: Not observed (despite cues, pt without verbal communication likely due to dementia) Volitional Cough: Cognitively unable to elicit Volitional Swallow: Unable to elicit    Oral/Motor/Sensory Function Overall Oral Motor/Sensory Function: Generalized oral weakness   Ice Chips Ice chips: Impaired Presentation: Spoon Oral Phase Impairments: Impaired mastication Oral Phase Functional Implications: Prolonged oral transit Pharyngeal Phase Impairments: Suspected delayed Swallow;Multiple swallows   Thin Liquid Thin Liquid: Impaired Presentation: Cup;Straw Oral Phase Impairments: Reduced labial seal Oral Phase Functional Implications: Oral holding;Prolonged oral transit Pharyngeal  Phase Impairments: Suspected delayed Swallow;Multiple swallows;Cough - Delayed    Nectar Thick Nectar Thick Liquid: Not tested   Honey Thick Honey Thick Liquid: Not tested   Puree Puree: Within functional limits   Solid     Solid: Impaired Presentation: Spoon Oral Phase Impairments: Impaired mastication;Reduced lingual movement/coordination Oral Phase Functional Implications: Oral residue;Prolonged oral transit;Impaired mastication Pharyngeal Phase Impairments: Suspected delayed Swallow;Multiple swallows      Shaun Rasmussen MA, CCC-SLP Acute Rehabilitation Services   03/10/2020,12:41 PM

## 2020-03-11 DIAGNOSIS — D696 Thrombocytopenia, unspecified: Secondary | ICD-10-CM | POA: Diagnosis not present

## 2020-03-11 DIAGNOSIS — G309 Alzheimer's disease, unspecified: Secondary | ICD-10-CM | POA: Diagnosis not present

## 2020-03-11 DIAGNOSIS — S065X9A Traumatic subdural hemorrhage with loss of consciousness of unspecified duration, initial encounter: Secondary | ICD-10-CM | POA: Diagnosis not present

## 2020-03-11 DIAGNOSIS — Z789 Other specified health status: Secondary | ICD-10-CM

## 2020-03-11 DIAGNOSIS — R569 Unspecified convulsions: Secondary | ICD-10-CM | POA: Diagnosis not present

## 2020-03-11 DIAGNOSIS — D469 Myelodysplastic syndrome, unspecified: Secondary | ICD-10-CM | POA: Diagnosis not present

## 2020-03-11 DIAGNOSIS — E871 Hypo-osmolality and hyponatremia: Secondary | ICD-10-CM

## 2020-03-11 DIAGNOSIS — Z66 Do not resuscitate: Secondary | ICD-10-CM

## 2020-03-11 LAB — SODIUM, URINE, RANDOM: Sodium, Ur: 75 mmol/L

## 2020-03-11 LAB — CBC
HCT: 23.1 % — ABNORMAL LOW (ref 39.0–52.0)
Hemoglobin: 7.7 g/dL — ABNORMAL LOW (ref 13.0–17.0)
MCH: 32.8 pg (ref 26.0–34.0)
MCHC: 33.3 g/dL (ref 30.0–36.0)
MCV: 98.3 fL (ref 80.0–100.0)
Platelets: 13 10*3/uL — CL (ref 150–400)
RBC: 2.35 MIL/uL — ABNORMAL LOW (ref 4.22–5.81)
RDW: 18.4 % — ABNORMAL HIGH (ref 11.5–15.5)
WBC: 4.4 10*3/uL (ref 4.0–10.5)
nRBC: 0 % (ref 0.0–0.2)

## 2020-03-11 LAB — BASIC METABOLIC PANEL
Anion gap: 10 (ref 5–15)
BUN: 28 mg/dL — ABNORMAL HIGH (ref 8–23)
CO2: 19 mmol/L — ABNORMAL LOW (ref 22–32)
Calcium: 8.3 mg/dL — ABNORMAL LOW (ref 8.9–10.3)
Chloride: 103 mmol/L (ref 98–111)
Creatinine, Ser: 0.97 mg/dL (ref 0.61–1.24)
GFR, Estimated: >60 mL/min (ref >60)
Glucose, Bld: 96 mg/dL (ref 70–99)
Potassium: 4.5 mmol/L (ref 3.5–5.1)
Sodium: 132 mmol/L — ABNORMAL LOW (ref 135–145)

## 2020-03-11 LAB — OSMOLALITY, URINE: Osmolality, Ur: 809 mOsm/kg (ref 300–900)

## 2020-03-11 MED ORDER — AMINOCAPROIC ACID 0.25 GM/ML PO SOLN
1250.0000 mg | Freq: Three times a day (TID) | ORAL | Status: DC
  Administered 2020-03-11 – 2020-03-18 (×21): 1250 mg via ORAL
  Filled 2020-03-11 (×23): qty 5

## 2020-03-11 MED ORDER — SODIUM CHLORIDE 0.9% IV SOLUTION: Freq: Once | INTRAVENOUS | Status: AC

## 2020-03-11 NOTE — Procedures (Signed)
Patient Name:Shaun Hobbs Epilepsy Attending:Trae Bovenzi [name] Referring Physician/Provider:Dr Emelda Brothers Duration:03/10/2020 1942 to 2/2/20221113  Patient SWHQPRF:87 y.o.man w/ MDS, PLT of 5 p/w AMS.CTHpersonally reviewed, which shows L convexity mixed density SDH with minimal brain compression, no midline shift.Had an episode ofaphasia and some R sided shaking that resolved.EEG to evaluate for seizure  Level of alertness:Awake, asleep  AEDs during EEG study:LEV  Technical aspects: This EEG study was done with scalp electrodes positioned according to the 10-20 International system of electrode placement. Electrical activity was acquired at a sampling rate of 500Hz  and reviewed with a high frequency filter of 70Hz  and a low frequency filter of 1Hz . EEG data were recorded continuously and digitally stored.   Description:Noposterior dominant rhythm was seen.Sleep was characterized by sleep spindles (12 to 14 Hz), maximal frontocentral region.EEG showed continuous generalized 3 to 6 Hz theta-delta slowing.Hyperventilation and photic stimulation were not performed.   ABNORMALITY -Continuousslow, generalized  IMPRESSION: This study issuggestive of moderate diffuse encephalopathy, nonspecific etiology. Noseizures or epileptiform discharges were seen throughout the recording.  Shaun Hobbs [name]

## 2020-03-11 NOTE — Progress Notes (Signed)
vLTM EEG complete. No skin breakdown 

## 2020-03-11 NOTE — Evaluation (Signed)
Occupational Therapy Evaluation Patient Details Name: Shaun Hobbs MRN: [id number] DOB: [birth year] Today's Date: 03/11/2020    History of Present Illness Shaun Hobbs is a 87 y.o. with pertinent PMH of myelodysplastic syndrome, alzheimer's dementia, and hypothyroidism  who presented with altered mental status, found to have a left-sided subdural hematoma.   Clinical Impression   PTA patient was living with his wife in a private residence and was independent with ADLs. Wife provided assistance for IADLs. Patient demonstrates increased alertness this date with ability to recall name and DOB. Patient currently functioning below baseline requiring HHA +1 and Min A for ADL transfers, Min A for UB bathing/dressing, and Mod A for LB bathing/dressing and toileting/hygiene management. Patient also limited by generalized weakness and decreased standing balance requiring at least unilateral UE support during ADL transfers and toileting tasks. Patient would benefit from continued acute OT services to maximize safety and independence with self-care tasks, decrease risk of falls, and decrease caregiver burden. Recommendation for return home with spouse and HHOT. If wife is unable to provide Min A, recommendation for short-term SNF rehab to achieve supervision level prior to return home.     Follow Up Recommendations  Home health OT;SNF;Supervision/Assistance - 24 hour    Equipment Recommendations  3 in 1 bedside commode    Recommendations for Other Services       Precautions / Restrictions Precautions Precautions: Fall Precaution Comments: Incontinent x2 Restrictions Weight Bearing Restrictions: No      Mobility Bed Mobility Overal bed mobility: Needs Assistance Bed Mobility: Supine to Sit Rolling: Min assist         General bed mobility comments: Min A at BLE and trunk. Able to compete anterior scoots toward EOB with Min guard.    Transfers Overall transfer level: Needs  assistance Equipment used: Rolling walker (2 wheeled);1 person hand held assist Transfers: Sit to/from [facility] Sit to Stand: Min assist Stand pivot transfers: Min assist       General transfer comment: Min A for sit to stand x4 and stand-pivot transfer from BSC<>recliner. Cues for hand placement.    Balance Overall balance assessment: Needs assistance Sitting-balance support: Single extremity supported Sitting balance-Leahy Scale: Fair Sitting balance - Comments: Min guard for doffing/donning footwear seated EOB.   Standing balance support: Single extremity supported;Bilateral upper extremity supported;During functional activity Standing balance-Leahy Scale: Poor Standing balance comment: Requires at least unilateral UE support.                           ADL either performed or assessed with clinical judgement   ADL Overall ADL's : Needs assistance/impaired                 Upper Body Dressing : Minimal assistance;Sitting Upper Body Dressing Details (indicate cue type and reason): Donned anterior hospital gown. Lower Body Dressing: Minimal assistance;Sit to/from stand Lower Body Dressing Details (indicate cue type and reason): Able to doff/don footwear seated EOB with Min gaurd and more than increased time. Toilet Transfer: Minimal Insurance claims handler Details (indicate cue type and reason): To BSC with use of RW. Cues for hand placement. Patient requires increased time. Toileting- Clothing Manipulation and Hygiene: Moderate assistance Toileting - Clothing Manipulation Details (indicate cue type and reason): Patient incontinent of bowel prior to toiet transfer but seemingly unaware. Mod A for hygiene/clothing management.     Functional mobility during ADLs: Minimal assistance;Rolling walker General ADL Comments: Min A for functional mobility  with HHA vs RW. Ambulates without AD at baseline.     Vision Baseline Vision/History: Wears  glasses Additional Comments: Will continue to assess.     Perception     Praxis      Pertinent Vitals/Pain Pain Assessment: Faces Faces Pain Scale: No hurt Pain Intervention(s): Monitored during session     Hand Dominance Right   Extremity/Trunk Assessment Upper Extremity Assessment Upper Extremity Assessment: Generalized weakness   Lower Extremity Assessment Lower Extremity Assessment: Generalized weakness   Cervical / Trunk Assessment Cervical / Trunk Assessment: Kyphotic   Communication Communication Communication: HOH   Cognition Arousal/Alertness: Awake/alert Behavior During Therapy: Restless;Flat affect Overall Cognitive Status: Impaired/Different from baseline                                 General Comments: Patient able to state name and DOB. Disoriented to place and situation. Patient follows 1-step verbal commands with increased time and 80% accuracy.   General Comments  VSS    Exercises     Shoulder Instructions      Home Living Family/patient expects to be discharged to:: Private residence Living Arrangements: Spouse/significant other Available Help at Discharge: Available 24 hours/day Type of Home: House Home Access: Stairs to enter [employer] of Steps: 1   Home Layout: One level     Bathroom Shower/Tub: Tub/shower unit;Walk-in shower   Bathroom Toilet: Standard Bathroom Accessibility: Yes   Home Equipment: Walker - 2 wheels;Grab bars - toilet;Grab bars - tub/shower;Other (comment);Shower seat (walking stick)   Additional Comments: pt with baseline dementia; wife states pt can get up and dressed and walk around house without assist. S needed when out of house. wife performs Nurse, learning disability and medicine management      Prior Functioning/Environment Level of Independence: Independent                 OT Problem List: Decreased strength;Decreased activity tolerance;Impaired balance (sitting and/or  standing);Decreased cognition;Decreased safety awareness;Decreased knowledge of use of DME or AE      OT Treatment/Interventions: Self-care/ADL training;Therapeutic exercise;Energy conservation;DME and/or AE instruction;Therapeutic activities;Cognitive remediation/compensation;Patient/family education;Balance training    OT Goals(Current goals can be found in the care plan section) Acute Rehab OT Goals Patient Stated Goal: Unable to state OT Goal Formulation: With patient/family Time For Goal Achievement: 03/25/20 Potential to Achieve Goals: Good ADL Goals Pt Will Perform Grooming: with supervision;standing Pt Will Perform Lower Body Dressing: sit to/from stand;with supervision Pt Will Transfer to Toilet: ambulating;with supervision Pt Will Perform Toileting - Clothing Manipulation and hygiene: with supervision;sit to/from stand  OT Frequency: Min 2X/week   Barriers to D/C:            Co-evaluation              AM-PAC OT "6 Clicks" Daily Activity     Outcome Measure Help from another person eating meals?: A Little Help from another person taking care of personal grooming?: A Little Help from another person toileting, which includes using toliet, bedpan, or urinal?: A Lot Help from another person bathing (including washing, rinsing, drying)?: A Lot Help from another person to put on and taking off regular upper body clothing?: A Little Help from another person to put on and taking off regular lower body clothing?: A Little 6 Click Score: 16   End of Session Equipment Utilized During Treatment: Gait belt;Rolling walker  Activity Tolerance: Patient tolerated treatment well Patient left: in chair;with  call bell/phone within reach;with chair alarm set  OT Visit Diagnosis: Unsteadiness on feet (R26.81);Other abnormalities of gait and mobility (R26.89);Muscle weakness (generalized) (M62.81);History of falling (Z91.81)                Time: 3716-9678 OT Time Calculation (min):  43 min Charges:  OT General Charges $OT Visit: 1 Visit OT Evaluation $OT Eval Moderate Complexity: 1 Mod OT Treatments $Self Care/Home Management : 23-37 mins  Korey Prashad H. OTR/L Supplemental OT, Department of rehab services [phone]  Akeia Perot R H. 03/11/2020, 3:10 PM

## 2020-03-11 NOTE — Progress Notes (Signed)
Daily Progress Note   Patient Name: Shaun Hobbs       Date: 03/11/2020 DOB: 1928/08/12  Age: 87 y.o. MRN#: [id number] Attending Physician: Aldine Contes, MD Primary Care Physician: Chesley Noon, MD Admit Date: 03/08/2020  Reason for Consultation/Follow-up: Establishing goals of care  Subjective: Chart review performed. Received report from primary RN - no acute concerns. RN states patient has made significant improvements today including: following simple commands, working with OT, up to chair, eating, talking, taking meds.  Went to visit patient at bedside - wife/Frances and DIL/Ruthanne was present. Patient was sitting up in chair working with OT - he had an incontinent episode and OT and NT were cleaning him. Observed patient follow command to "wash his leg" with wash cloth. Offered to meet with family in unit conference room.  Met with [name] and Ruthanne in 3W waiting room. Discussed information family had received from Julia/PMT during previous visit and medical updates since last PMT visit. Family state they also have observed significant improvement in the patient's condition. The biggest question family had today was the difference between Palliative Care vs hospice options. We spent a significant amount of time discussing the difference between Palliative and Hospice care. Palliative care and hospice have similar goals of managing symptoms, promoting comfort, improving quality of life, and maintaining a person's dignity. However, palliative care may be offered during any phase of a serious illness, while hospice care is usually offered when a person is expected to live for 6 months or less. Family believed that "receiving palliative care" meant patient would stay in the hospital to  receive care. Detailed education was provided on what palliative care is and what services they provided in house and also in the community. We reviewed disposition options that include: home with HHPT/OT, rehab, home hospice, residential hospice, and returning home without support.  Discussed in detail these options in context of patient's dementia and myelodysplastic syndrome. After discussion, family feel that rehab is not the best option for the patient - they feel patient would benefit from being home and are considering HHPT/OT with outpatient palliative care to follow vs home hospice. We reviewed that patient's generalized weakness could be due to weakness from immobility from acute medical situation vs possible dementia progression. Natural disease trajectory for dementia was reviewed. Family clearly understands  that dementia and myelodysplastic syndrome are a progressive, non-curable diseases underlying the patient's current acute medical conditions. We discussed that depending on the cause of weakness, it might depend on how beneficial PT would or would not be for the patient. Time limited trials around PT were discussed and recommended - family expressed understanding and were in agreement.   I introduced the concept of a comfort path to patient and family and encouraged them to think about at what point he would want to stop aggressive medical interventions and focus on helping him feel as best he can for as long as he can with the goal of comfort rather than cure/prolonging life. Reviewed that the patient is at high risk of rehospitalization as well as  disease progression. Reviewed that with each hospitalization/decline we as the medical team can see a time down the road where the patient may physically not be strong enough to recover to what he/they would consider a good quality of life and it is important to prepare for that time. Therapeutic listening was provided as patient's wife reviewed his  decline with myelodysplastic syndrome - she states he started needing blood transfusions q3 months and now needs them q2 weeks - she understands there will be a time he would need them q1 week or q1day as his disease progresses - she states she would not feel at that time he would have a good quality of life. She understands there will be a time, whether now or in the future, these decisions will need to be made. Family's goal right now, however, is to continue blood transfusions.   Reviewed home health aids as additional support for family at home if it was their desire for patient to return home with hospice or home with HHPT. Family expressed interested in getting more information on home health aids - explained I would notify LCSW.  Family express desire to continue current medical treatment with watchful waiting. They want to obtain results from pending test/procedures before making final decisions - they express they want to be able to make the best decisions for the patient after receiving all information, which is reasonable. At this time, it seems the family will likely pursue home with HHPT and outpatient PC to follow but this is not a final decision.  All questions and concerns addressed. Encouraged to call with questions and/or concerns. PMT card provided.   Length of Stay: 3  Current Medications: Scheduled Meds:  . chlorhexidine  15 mL Mouth Rinse BID  . donepezil  10 mg Oral Daily  . levothyroxine  100 mcg Oral QAC breakfast  . mouth rinse  15 mL Mouth Rinse BID    Continuous Infusions: . sodium chloride Stopped (03/08/20 1557)  . levETIRAcetam 750 mg (03/11/20 0528)    PRN Meds: acetaminophen **OR** acetaminophen, polyethylene glycol  Physical Exam Vitals and nursing note reviewed.  Constitutional:      General: He is not in acute distress. Pulmonary:     Effort: No respiratory distress.  Skin:    General: Skin is warm and dry.  Neurological:     Mental Status: He  is alert. He is confused.     Motor: Weakness present.  Psychiatric:        Attention and Perception: Attention normal.        Behavior: Behavior is cooperative.        Cognition and Memory: Cognition is impaired. Memory is impaired.  Vital Signs: BP 127/72   Pulse 72   Temp 97.8 F (36.6 C) (Oral)   Resp (!) 21   SpO2 97%  SpO2: SpO2: 97 % O2 Device: O2 Device: Room Air O2 Flow Rate:    Intake/output summary:   Intake/Output Summary (Last 24 hours) at 03/11/2020 1555 Last data filed at 03/11/2020 1035 Gross per 24 hour  Intake 475 ml  Output 650 ml  Net -175 ml   LBM:   Baseline Weight:   Most recent weight:         Palliative Assessment/Data: PPS 50%      Patient Active Problem List   Diagnosis Date Noted  . Subdural hematoma ([place]) 03/08/2020  . Chronic ITP (idiopathic thrombocytopenia) (HCC) 10/24/2019  . Counseling regarding advance care planning and goals of care 10/24/2019  . Benign essential hypertension 02/26/2019  . Graves disease 02/26/2019  . History of lumbar laminectomy 02/26/2019  . Hypercholesterolemia 02/26/2019  . Lumbar back pain 02/26/2019  . Thrombocytopenia ([place]) 02/26/2019  . Pneumonia due to COVID-19 virus 11/25/2018  . Myelodysplasia (myelodysplastic syndrome) ([place]) 11/25/2018  . Dementia without behavioral disturbance ([place]) 11/25/2018  . Anemia with low platelet count ([place]) 11/14/2018  . Benign prostatic hyperplasia 11/14/2018  . Memory loss 11/14/2018  . Advance care planning 01/12/2018  . Chemotherapy induced diarrhea 05/09/2017  . Early onset Alzheimer's dementia without behavioral disturbance ([place]) 05/09/2017  . IFG (impaired fasting glucose) 05/09/2017  . Bilateral impacted cerumen 04/15/2016  . Dermatitis 02/23/2016  . Bilateral carotid bruits 12/09/2015  . DNR (do not resuscitate) 12/09/2015  . Other microscopic hematuria 12/09/2015  . Hypothyroidism 12/08/2015  . Vitamin D deficiency 12/08/2015    Palliative  Care Assessment & Plan   Patient Profile: 87 y.o. male  with past medical history of myelodysplastic syndrome, Alzheimer's dementia, and hypothyroidism presented to the emergency department on 03/08/2020 with altered mental status. His wife reports decreased cognition over the past 3-4 days, decreased speech, and inability to perform ADLs. Per family report this is a sudden and drastic change from baseline.  ED Course: Platelets < 5. CT scan revealed subdural hematoma. Hematology recommended 2 units of platelets. Neurosurgery does not recommend surgical intervention. Patient admitted to Internal Medicine TS for further management.  Assessment: Altered mental status Left-sided subdural hematoma Myelodysplastic syndrome Alzheimer's dementia  Recommendations/Plan:  Continue current medical treatment  Continue DNR/DNI as previously documented  Family would like to receive all pending tests/procedure results before making final decisions    TOC notified and consulted for: family's request for information on home health aid support  Ongoing [place] discussions pending clinical course and test/procedure results  PMT will continue to follow and support holistically  Goals of Care and Additional Recommendations:  Limitations on Scope of Treatment: Full Scope Treatment  Code Status:    Code Status Orders  (From admission, onward)         Start     Ordered   03/08/20 1450  Do not attempt resuscitation (DNR)  Continuous       Question Answer Comment  In the event of cardiac or respiratory ARREST Do not call a "code blue"   In the event of cardiac or respiratory ARREST Do not perform Intubation, CPR, defibrillation or ACLS   In the event of cardiac or respiratory ARREST Use medication by any route, position, wound care, and other measures to relive pain and suffering. May use oxygen, suction and manual treatment of airway obstruction as needed for comfort.   Comments  discussed with  patient's Son      03/08/20 1452        Code Status History    Date Active Date Inactive Code Status Order ID Comments User Context   11/25/2018 2320 11/30/2018 1612 DNR [id number]  Barb Merino, MD ED   11/25/2018 1747 11/25/2018 2320 DNR [id number]  Barb Merino, MD ED   Advance Care Planning Activity       Prognosis:   Unable to determine  Discharge Planning:  To Be Determined  Care plan was discussed with primary RN, patient's wife and DIL, Dr. Dareen Piano, [facility],   Thank you for allowing the Palliative Medicine Team to assist in the care of this patient.   Total Time  75 minutes Prolonged Time Billed  yes       Greater than 50%  of this time was spent counseling and coordinating care related to the above assessment and plan.  Lin Landsman, NP  Please contact Palliative Medicine Team phone at [phone] for questions and concerns.

## 2020-03-11 NOTE — TOC Initial Note (Signed)
Transition of Care [facility]) - Initial/Assessment Note    Patient Details  Name: Shaun Hobbs MRN: [id number] Date of Birth: 1928-07-09  Transition of [facility]) CM/SW Contact:    Marney Setting, [place] Work Phone Number: 03/11/2020, 11:24 AM  Clinical Narrative: MSW Student spoke with PT's wife about choosing HH, SNF, in-home hospice or residential hospice. Wife is choosing comfort care and is unsure on in-home or residential hospice. Unsure if PT qualifies for residential hospice. Palliative care will follow up with family today and inform wife on options.               Expected Discharge Plan: [place] Barriers to Discharge: Continued Medical Work up   Patient Goals and CMS Choice Patient states their goals for this hospitalization and ongoing recovery are:: Unable to assess patient onlt oriented to self.      Expected Discharge Plan and Services Expected Discharge Plan: [place]     Post Acute Care Choice: Hospice Living arrangements for the past 2 months: Single Family Home                                      Prior Living Arrangements/Services Living arrangements for the past 2 months: Single Family Home Lives with:: Spouse Patient language and need for interpreter reviewed:: Yes Do you feel safe going back to the place where you live?: Yes      Need for Family Participation in Patient Care: Yes (Comment)     Criminal Activity/Legal Involvement Pertinent to Current Situation/Hospitalization: No - Comment as needed  Activities of Daily Living Home Assistive Devices/Equipment: Shower chair with back ADL Screening (condition at time of admission) Patient's cognitive ability adequate to safely complete daily activities?: Yes Is the patient deaf or have difficulty hearing?: No Does the patient have difficulty seeing, even when wearing glasses/contacts?: No Does the patient have difficulty concentrating, remembering, or  making decisions?: Yes Patient able to express need for assistance with ADLs?: Yes Does the patient have difficulty dressing or bathing?: No Independently performs ADLs?: Yes (appropriate for developmental age) Does the patient have difficulty walking or climbing stairs?: No Weakness of Legs: None Weakness of Arms/Hands: None  Permission Sought/Granted Permission sought to share information with : Family Supports Permission granted to share information with : Yes, Verbal Permission Granted  Share Information with NAME: Shaun Hobbs     Permission granted to share info w Relationship: Spouse     Emotional Assessment Appearance:: Appears stated age Attitude/Demeanor/Rapport: Unable to Assess Affect (typically observed): Unable to Assess Orientation: : Oriented to Self Alcohol / Substance Use: Not Applicable Psych Involvement: No (comment)  Admission diagnosis:  Subdural hematoma ([place]) [S06.5X9A] Thrombocytopenia ([place]) [D69.6] Patient Active Problem List   Diagnosis Date Noted  . Subdural hematoma ([place]) 03/08/2020  . Chronic ITP (idiopathic thrombocytopenia) (HCC) 10/24/2019  . Counseling regarding advance care planning and goals of care 10/24/2019  . Benign essential hypertension 02/26/2019  . Graves disease 02/26/2019  . History of lumbar laminectomy 02/26/2019  . Hypercholesterolemia 02/26/2019  . Lumbar back pain 02/26/2019  . Thrombocytopenia ([place]) 02/26/2019  . Pneumonia due to COVID-19 virus 11/25/2018  . Myelodysplasia (myelodysplastic syndrome) ([place]) 11/25/2018  . Dementia without behavioral disturbance ([place]) 11/25/2018  . Anemia with low platelet count ([place]) 11/14/2018  . Benign prostatic hyperplasia 11/14/2018  . Memory loss 11/14/2018  . Advance care planning 01/12/2018  . Chemotherapy induced diarrhea  05/09/2017  . Early onset Alzheimer's dementia without behavioral disturbance ([place]) 05/09/2017  . IFG (impaired fasting glucose) 05/09/2017  . Bilateral impacted  cerumen 04/15/2016  . Dermatitis 02/23/2016  . Bilateral carotid bruits 12/09/2015  . DNR (do not resuscitate) 12/09/2015  . Other microscopic hematuria 12/09/2015  . Hypothyroidism 12/08/2015  . Vitamin D deficiency 12/08/2015   PCP:  Chesley Noon, MD Pharmacy:   [facility] # [address], [place] [address] [place] [address] Phone: [phone] Fax: [phone]     Social Determinants of Health (SDOH) Interventions    Readmission Risk Interventions No flowsheet data found.

## 2020-03-11 NOTE — Progress Notes (Signed)
Subjective:   Hospital day: 3  Overnight event: No acute event  Patient is seen at bedside with wife.  He appears alert, awake and more interactive compared to yesterday.  He is verbal and oriented to person and place.  Patient is able to follow simple commands.   His wife states that they are working with the palliative team to decide what is the next step for his care.  She is concerned that patient is not able to walk yet but states that his mental status has improved significantly.  She expressed that she noticed his memory issue about 6 months ago when he could not perform ADLs like before.  They just moved to the area about a year ago to be closer to their children.  Objective:  Vital signs in last 24 hours: Vitals:   03/10/20 0948 03/10/20 1944 03/10/20 2328 03/11/20 0326  BP: 120/68 117/60 124/67 129/68  Pulse: 72 73 73 76  Resp: 19 20 17 17   Temp:  98 F (36.7 C) 98.3 F (36.8 C) 98.1 F (36.7 C)  TempSrc:  Oral Oral Oral  SpO2: 98% 99% 100% 98%   CBC Latest Ref Rng & Units 03/11/2020 03/10/2020 03/10/2020  WBC 4.0 - 10.5 K/uL 4.4 4.8 6.0  Hemoglobin 13.0 - 17.0 g/dL 7.7(L) 8.3(L) 8.9(L)  Hematocrit 39.0 - 52.0 % 23.1(L) 25.7(L) 25.7(L)  Platelets 150 - 400 K/uL 13(LL) 19(LL) 20(LL)   CMP Latest Ref Rng & Units 03/11/2020 03/10/2020 03/09/2020  Glucose 70 - 99 mg/dL 96 99 109(H)  BUN 8 - 23 mg/dL 28(H) 23 17  Creatinine 0.61 - 1.24 mg/dL 0.97 1.00 0.91  Sodium 135 - 145 mmol/L 132(L) 130(L) 129(L)  Potassium 3.5 - 5.1 mmol/L 4.5 3.6 3.8  Chloride 98 - 111 mmol/L 103 101 99  CO2 22 - 32 mmol/L 19(L) 18(L) 20(L)  Calcium 8.9 - 10.3 mg/dL 8.3(L) 8.2(L) 8.4(L)  Total Protein 6.5 - 8.1 g/dL - - 6.2(L)  Total Bilirubin 0.3 - 1.2 mg/dL - - 1.1  Alkaline Phos 38 - 126 U/L - - 44  AST 15 - 41 U/L - - 17  ALT 0 - 44 U/L - - 9     Physical Exam  Physical Exam Constitutional:      General: He is not in acute distress. HENT:     Head: Normocephalic.  Eyes:     General:         Right eye: No discharge.        Left eye: No discharge.     Pupils: Pupils are equal, round, and reactive to light.  Cardiovascular:     Rate and Rhythm: Regular rhythm.  Abdominal:     General: Bowel sounds are normal.     Tenderness: There is no abdominal tenderness.  Musculoskeletal:     Right lower leg: No edema.     Left lower leg: No edema.  Skin:    General: Skin is warm.  Neurological:     Mental Status: He is alert.     Comments: Normal strength of bilateral upper extremities. Able to move lower extremities      Assessment/Plan: CHANTZ MONTEFUSCO a 87 y.o.with pertinent PMH of myelodysplastic syndrome, alzheimer's dementia, and hypothyroidismwho presented with altered mental status, found to have a left-sided subdural hematoma.  His mental status is slowly improving.  Active Problems:   Myelodysplasia (myelodysplastic syndrome) (HCC)   Dementia without behavioral disturbance (HCC)   Thrombocytopenia (HCC)   Subdural hematoma (HCC)  Altered mental status-improving Left-sided subdural hematoma Patient appears more alert, awake and interactive today exam.  He is oriented to person and place.  Per his wife, patient's mental status has improved significantly.  EEG done last night suggests moderate diffuse encephalopathy and showed no seizures or epileptiform discharges.  We will continue AED at this time and continue to monitor his improvement for the next few days.   Per TOC note, his wife is choosing comfort care and will decide either in-home or residential hospice.  We will continue goal of care conversation with family about his discharge plan.  Palliative team is on board with the treatment plan. -Continue IV Keppra -Monitor mental status   Myelodysplastic syndrome Thrombocytopenia Heme/oncology was consulted and recommended keeping platelets above 20 given his subdural hematoma.  Patient received 1 units of platelets this morning given his platelet of  13. -Appreciate oncology recommendation -Hold Lysteda because this med cannot be crushed.  Switch to Amicar liquid solution per oncology and pharmacy. -CBC in a.m.   Hyponatremia Normal renal function.  Likely due to poor appetite in combination with centrally acting process.  Serum osmolarity within normal limits suggest pseudohyponatremia. -Monitor BMP   Alzheimer -Continue Aricept    Hypothyroidism -Continue levothyroxine  Diet:  Dysphagia 1 IVF: NA VTE: SCD CODE: DNR  Prior to Admission Living Arrangement: Home Anticipated Discharge Location: TBD Barriers to Discharge: Altered mental status  Gaylan Gerold, DO 03/11/2020, 7:01 AM Pager: [phone] After 5pm on weekdays and 1pm on weekends: On Call pager [phone]

## 2020-03-11 NOTE — Progress Notes (Signed)
  Speech Language Pathology Treatment: Dysphagia  Patient Details Name: Shaun Hobbs MRN: [id number] DOB: 1928-07-23 Today's Date: 03/11/2020 Time: 4970-2637 SLP Time Calculation (min) (ACUTE ONLY): 11 min  Assessment / Plan / Recommendation Clinical Impression  Pt encountered sitting in chair and wife was not present although belongings were in room. He was alert, followed simple commands and quiet. Trials thin with and without straw, puree and mechanical soft texture consumed with mild assist to initiate and coordinate self feeding. Bite and sip size were appropriate and only required supervision to mild assist. Suspect he is experiencing intermittent penetration/aspiration indicated with one immediate cough of two trials with straw that was not present with solid or puree. Mastication only mildly delayed without residue although there is potential. ST upgraded from puree to Dys 2 (fine chopped)- wife not present to educate. Continue thin liquids, pills crushed, straws allowed (remove if coughing ), full assist and check oral cavity for potential pocketing.    HPI HPI: 87 y/o M with a PMHx of myelodysplastic syndrome, alzheimer's dementia, and hypothyroidism presented to the ED today with his wife for decreased cognition over the past 3-4 days that acutely worsened over the past day. His wife noted decreased speech,conversation and ability to perform his ADL's over the past week. CT of head noted with Small subdural hematoma along the LEFT tentorium, extending onto LEFT falx and anteriorly into the medial aspect of the LEFT middle cranial fossa as well as extending into the LEFT occipital region, measuring up to 4 mm thick. Additional small amount of subdural blood overlying the LEFT occipital lobe posteriorly.      SLP Plan  Continue with current plan of care       Recommendations  Diet recommendations: Dysphagia 2 (fine chop);Thin liquid Liquids provided via: Cup;Straw Medication  Administration: Crushed with puree Supervision: Patient able to self feed;Staff to assist with self feeding;Full supervision/cueing for compensatory strategies Compensations: Minimize environmental distractions;Slow rate;Small sips/bites;Lingual sweep for clearance of pocketing Postural Changes and/or Swallow Maneuvers: Seated upright 90 degrees                Oral Care Recommendations: Oral care BID Follow up Recommendations: 24 hour supervision/assistance SLP Visit Diagnosis: Dysphagia, unspecified (R13.10) Plan: Continue with current plan of care       [place], Sabrine Patchen Willis 03/11/2020, 3:18 PM

## 2020-03-11 NOTE — Care Management Important Message (Signed)
Important Message  Patient Details  Name: Shaun Hobbs MRN: [id number] Date of Birth: Jan 21, 1929   Medicare Important Message Given:  Yes     Margree Gimbel 03/11/2020, 11:31 AM

## 2020-03-11 NOTE — Progress Notes (Addendum)
Physical Therapy Treatment Patient Details Name: Shaun Hobbs MRN: [id number] DOB: 1928/07/14 Today's Date: 03/11/2020    History of Present Illness Shaun Hobbs is a 87 y.o. with pertinent PMH of myelodysplastic syndrome, alzheimer's dementia, and hypothyroidism  who presented with altered mental status, found to have a left-sided subdural hematoma.    PT Comments    Pt tolerates treatment well despite being fatigued upon arrival. Pt continues to require assistance to transfer due to weakness and imbalance. Pt follows commands well this session and participates in HEP once returning to bed. Pt will benefit from further gait and balance training next session. PT updates recommendations to CIR as the pt was mobilizing at a supervision level prior to admission and PT feels the pt could progress back to a supervision level with high intensity inpatient PT services.  Family seem agreeable to the possibility of inpatient rehab services at the time of discharge, differing from thei conversation with palliative care per chart review.  Follow Up Recommendations  CIR     Equipment Recommendations  None recommended by PT    Recommendations for Other Services Rehab consult     Precautions / Restrictions Precautions Precautions: Fall Precaution Comments: incontinent of stool Restrictions Weight Bearing Restrictions: No    Mobility  Bed Mobility Overal bed mobility: Needs Assistance Bed Mobility: Sit to Supine Rolling: Min assist     Sit to supine: Supervision   General bed mobility comments: Min A at BLE and trunk. Able to compete anterior scoots toward EOB with Min guard.  Transfers Overall transfer level: Needs assistance Equipment used: 1 person hand held assist Transfers: Sit to/from [facility] Sit to Stand: Min assist Stand pivot transfers: Min assist       General transfer comment: Min A for sit to stand x4 and stand-pivot transfer from BSC<>recliner.  Cues for hand placement.  Ambulation/Gait Ambulation/Gait assistance: Min Web designer (Feet): 2 Feet Assistive device: 1 person hand held assist Gait Pattern/deviations: Step-to pattern Gait velocity: reduced Gait velocity interpretation: <1.31 ft/sec, indicative of household ambulator General Gait Details: pt takes short steps to pivot from recliner to bed with PT hand hold   Stairs             Wheelchair Mobility    Modified Rankin (Stroke Patients Only) Modified Rankin (Stroke Patients Only) Pre-Morbid Rankin Score: No significant disability Modified Rankin: Moderately severe disability     Balance Overall balance assessment: Needs assistance Sitting-balance support: Single extremity supported;Feet supported;No upper extremity supported Sitting balance-Leahy Scale: Fair Sitting balance - Comments: Min guard for doffing/donning footwear seated EOB.   Standing balance support: Single extremity supported;Bilateral upper extremity supported Standing balance-Leahy Scale: Poor Standing balance comment: reliant on UE support for stability                            Cognition Arousal/Alertness: Awake/alert Behavior During Therapy: Flat affect Overall Cognitive Status: Impaired/Different from baseline Area of Impairment: Attention;Memory;Following commands;Safety/judgement;Awareness;Problem solving                   Current Attention Level: Focused Memory: Decreased recall of precautions;Decreased short-term memory Following Commands: Follows one step commands with increased time Safety/Judgement: Decreased awareness of safety;Decreased awareness of deficits Awareness: Intellectual Problem Solving: Slow processing;Difficulty sequencing;Requires verbal cues General Comments: Patient able to state name and DOB. Disoriented to place and situation. Patient follows 1-step verbal commands with increased time and 80% accuracy.  Exercises  General Exercises - Lower Extremity Ankle Circles/Pumps: AROM;Both;10 reps Heel Slides: AROM;Both;10 reps Hip ABduction/ADduction: AROM;Both;10 reps Straight Leg Raises: AROM;Both;10 reps    General Comments General comments (skin integrity, edema, etc.): VSS present on RA, family present and inquisitive      Pertinent Vitals/Pain Pain Assessment: No/denies pain Faces Pain Scale: No hurt Pain Intervention(s): Monitored during session    Home Living Family/patient expects to be discharged to:: Private residence Living Arrangements: Spouse/significant other Available Help at Discharge: Available 24 hours/day Type of Home: House Home Access: Stairs to enter   Home Layout: One level Home Equipment: Walker - 2 wheels;Grab bars - toilet;Grab bars - tub/shower;Other (comment);Shower seat (walking stick) Additional Comments: pt with baseline dementia; wife states pt can get up and dressed and walk around house without assist. S needed when out of house. wife performs Nurse, learning disability and medicine management    Prior Function Level of Independence: Independent          PT Goals (current goals can now be found in the care plan section) Acute Rehab PT Goals Patient Stated Goal: Unable to state Progress towards PT goals: Progressing toward goals    Frequency    Min 4X/week      PT Plan Discharge plan needs to be updated    Co-evaluation              AM-PAC PT "6 Clicks" Mobility   Outcome Measure  Help needed turning from your back to your side while in a flat bed without using bedrails?: A Little Help needed moving from lying on your back to sitting on the side of a flat bed without using bedrails?: A Little Help needed moving to and from a bed to a chair (including a wheelchair)?: A Little Help needed standing up from a chair using your arms (e.g., wheelchair or bedside chair)?: A Little Help needed to walk in hospital room?: A Lot Help needed climbing 3-5 steps  with a railing? : Total 6 Click Score: 15    End of Session   Activity Tolerance: Patient tolerated treatment well Patient left: in bed;with call bell/phone within reach;with bed alarm set;with family/visitor present Nurse Communication: Mobility status PT Visit Diagnosis: Unsteadiness on feet (R26.81)     Time: 1540-1620 PT Time Calculation (min) (ACUTE ONLY): 40 min  Charges:  $Therapeutic Exercise: 8-22 mins $Therapeutic Activity: 23-37 mins                     Zenaida Niece, PT, DPT Acute Rehabilitation Pager: [phone]    Zenaida Niece 03/11/2020, 5:13 PM

## 2020-03-12 DIAGNOSIS — D696 Thrombocytopenia, unspecified: Secondary | ICD-10-CM | POA: Diagnosis not present

## 2020-03-12 DIAGNOSIS — Z789 Other specified health status: Secondary | ICD-10-CM | POA: Diagnosis not present

## 2020-03-12 DIAGNOSIS — Z66 Do not resuscitate: Secondary | ICD-10-CM | POA: Diagnosis not present

## 2020-03-12 DIAGNOSIS — D469 Myelodysplastic syndrome, unspecified: Secondary | ICD-10-CM | POA: Diagnosis not present

## 2020-03-12 DIAGNOSIS — S065X9A Traumatic subdural hemorrhage with loss of consciousness of unspecified duration, initial encounter: Secondary | ICD-10-CM | POA: Diagnosis not present

## 2020-03-12 DIAGNOSIS — G309 Alzheimer's disease, unspecified: Secondary | ICD-10-CM | POA: Diagnosis not present

## 2020-03-12 LAB — CBC
HCT: 22.5 % — ABNORMAL LOW (ref 39.0–52.0)
Hemoglobin: 7.1 g/dL — ABNORMAL LOW (ref 13.0–17.0)
MCH: 31.3 pg (ref 26.0–34.0)
MCHC: 31.6 g/dL (ref 30.0–36.0)
MCV: 99.1 fL (ref 80.0–100.0)
Platelets: 24 10*3/uL — CL (ref 150–400)
RBC: 2.27 MIL/uL — ABNORMAL LOW (ref 4.22–5.81)
RDW: 18.2 % — ABNORMAL HIGH (ref 11.5–15.5)
WBC: 3.7 10*3/uL — ABNORMAL LOW (ref 4.0–10.5)
nRBC: 0 % (ref 0.0–0.2)

## 2020-03-12 LAB — BPAM RBC
Blood Product Expiration Date: 202202112359
Blood Product Expiration Date: 202202112359
ISSUE DATE / TIME: 202201310450
Unit Type and Rh: 6200
Unit Type and Rh: 6200

## 2020-03-12 LAB — BASIC METABOLIC PANEL
Anion gap: 11 (ref 5–15)
BUN: 25 mg/dL — ABNORMAL HIGH (ref 8–23)
CO2: 19 mmol/L — ABNORMAL LOW (ref 22–32)
Calcium: 8.1 mg/dL — ABNORMAL LOW (ref 8.9–10.3)
Chloride: 102 mmol/L (ref 98–111)
Creatinine, Ser: 0.86 mg/dL (ref 0.61–1.24)
GFR, Estimated: >60 mL/min (ref >60)
Glucose, Bld: 115 mg/dL — ABNORMAL HIGH (ref 70–99)
Potassium: 3.7 mmol/L (ref 3.5–5.1)
Sodium: 132 mmol/L — ABNORMAL LOW (ref 135–145)

## 2020-03-12 LAB — BPAM PLATELET PHERESIS
Blood Product Expiration Date: 202202042359
ISSUE DATE / TIME: 202202020836
Unit Type and Rh: 7300

## 2020-03-12 LAB — TYPE AND SCREEN
ABO/RH(D): A POS
Antibody Screen: POSITIVE
Donor AG Type: NEGATIVE
Donor AG Type: NEGATIVE
Unit division: 0
Unit division: 0

## 2020-03-12 LAB — PREPARE PLATELET PHERESIS: Unit division: 0

## 2020-03-12 NOTE — Progress Notes (Signed)
Daily Progress Note   Patient Name: Shaun Hobbs       Date: 03/12/2020 DOB: Jun 24, 1928  Age: 87 y.o. MRN#: [id number] Attending Physician: Aldine Contes, MD Primary Care Physician: Chesley Noon, MD Admit Date: 03/08/2020  Reason for Consultation/Follow-up: Disposition and Establishing goals of care  Subjective: Chart review performed. Received report from primary RN - no acute concerns. RN reports patient walked down the hall with PT, is eating well, and taking meds.  Went to visit patient at bedside - wife/Shaun Hobbs and DIL/Shaun Hobbs was present. Patient was being transferred from chair to bed with 2 assist. Patient is confused but redirectable.   I continued discussion with family around disposition options. I spent a significant amount of time reviewing options and details and answering questions. Discussed disposition options to include: CIR, subacute rehab, HHPT, and hospice. Again we discussed today in detail these options in context of patient's dementia and myelodysplastic syndrome. We discussed that the patient is likely not going to return to previous baseline, would need increased assistance, and what that would mean for his care going forward. We discussed that the patient physically might improve but the concern is his cognition in context of his subdural hematoma and likely dementia progression. Education provided that brain injuries need time to heal. [name] states that in patient's current state, she would be unable to care for him at home. Dr. Dagoberto Ligas arrived to room and provided education and updates on rehab options. After her visit, I continued education around CIR vs subacute rehab per family request. I reiterated in detail each in context of patient's current medical  situation - recommendation was given by Dr. Dagoberto Ligas and myself for subacute rehab - explaining that it provides additional time for patient's cognitive function to improve, if it will at all, while they work on physical strength. Family expressed concern of COVID with subacute rehab - provided validation of concern, but did explained that COVID is a risk no matter where he goes. Education was again provided on hospice support. I offered to have hospice liaison reach out to family to provide additional information - family was agreeable and appreciative.   I reviewed with family that if they decide to pursue rehab a plan does need to be in place for when he returns home. Options to include after rehab discussed  were: home vs nursing home placement. HHPT, private duty caregiver support, outpatient palliative care, and hospice were further discussed as support at either home or nursing home. LCSW has provided family with list of private duty caregiver options per request yesterday.  Palliative care vs hospice was again reviewed in detail and offered. Family expressed they will definitely utilize outpatient Palliative Care services if they do not decide on hospice option.   I again reviewed the concept of a comfort path to patient and family and encouraged them to think about at what point he would want to stop aggressive medical interventions and focus on helping him feel as best he can for as long as he can with the goal of comfort rather than cure/prolonging life. Reviewed that the patient is at high risk of rehospitalization as well as  disease progression. Reviewed that with each hospitalization/decline we as the medical team can see a time down the road where the patient may physically not be strong enough to recover to what he/they would consider a good quality of life and it is important to prepare for that time. Therapeutic listening was provided as patient's wife reviewed his decline with myelodysplastic  syndrome - she understands there will be a time he would need them q1 week or q1day as his disease progresses - she states she would not feel at that time he would have a good quality of life. She understands there will be a time, whether now or in the future, these decisions will need to be made. Family's goal right now, however, is to continue blood transfusions.   [name] does clearly recognize, despite patient's improvement in the hospital, he has had significant overall physical and cognitive decline.   Family express interest in getting list of subacute rehab options - recommended they wait to get this until they decide on CIR vs subacute rehab as they have had a lot of information coming at them already and we do not want to overwhelm with details. Explained options depend on many things that include insurance, bed availability, etc. Recommended making step-wise decisions. Family expressed they would find it helpful to get list of options - stated I would let LCSW know if their request.  I drew and provided a diagram of all options discussed for family as they seem to be having a hard time keeping up with information given to them. Family is all about details but I don't think they are quite understanding how it all fits together, but they are trying - they are requiring lots of education and reiteration on topics.  All questions and concerns addressed. Encouraged to call with questions and/or concerns. PMT card previously provided.   Length of Stay: 4  Current Medications: Scheduled Meds:  . aminocaproic acid  1,250 mg Oral TID  . chlorhexidine  15 mL Mouth Rinse BID  . donepezil  10 mg Oral Daily  . levothyroxine  100 mcg Oral QAC breakfast  . mouth rinse  15 mL Mouth Rinse BID    Continuous Infusions: . sodium chloride 10 mL/hr (03/11/20 2220)  . levETIRAcetam 750 mg (03/12/20 0544)    PRN Meds: acetaminophen **OR** acetaminophen, polyethylene glycol  Physical Exam Vitals and  nursing note reviewed.  Constitutional:      General: He is not in acute distress.    Appearance: He is ill-appearing.  Pulmonary:     Effort: No respiratory distress.  Skin:    General: Skin is warm and dry.  Neurological:  Mental Status: He is alert. He is disoriented and confused.     Motor: Weakness present.  Psychiatric:        Cognition and Memory: Cognition is impaired. Memory is impaired.        Judgment: Judgment is impulsive.             Vital Signs: BP (!) 103/54 (BP Location: Left Arm)   Pulse 64   Temp 98.3 F (36.8 C) (Oral)   Resp (!) 21   SpO2 100%  SpO2: SpO2: 100 % O2 Device: O2 Device: Room Air O2 Flow Rate:    Intake/output summary:   Intake/Output Summary (Last 24 hours) at 03/12/2020 1707 Last data filed at 03/12/2020 0419 Gross per 24 hour  Intake --  Output 900 ml  Net -900 ml   LBM: Last BM Date: 03/11/20 Baseline Weight:   Most recent weight:         Palliative Assessment/Data: PPS 50%      Patient Active Problem List   Diagnosis Date Noted  . Subdural hematoma ([place]) 03/08/2020  . Chronic ITP (idiopathic thrombocytopenia) ([facility]) 10/24/2019  . Counseling regarding advance care planning and goals of care 10/24/2019  . Benign essential hypertension 02/26/2019  . Graves disease 02/26/2019  . History of lumbar laminectomy 02/26/2019  . Hypercholesterolemia 02/26/2019  . Lumbar back pain 02/26/2019  . Thrombocytopenia ([place]) 02/26/2019  . Pneumonia due to COVID-19 virus 11/25/2018  . Myelodysplasia (myelodysplastic syndrome) ([place]) 11/25/2018  . Dementia without behavioral disturbance ([place]) 11/25/2018  . Anemia with low platelet count ([place]) 11/14/2018  . Benign prostatic hyperplasia 11/14/2018  . Memory loss 11/14/2018  . Advance care planning 01/12/2018  . Chemotherapy induced diarrhea 05/09/2017  . Early onset Alzheimer's dementia without behavioral disturbance ([place]) 05/09/2017  . IFG (impaired fasting glucose) 05/09/2017  .  Bilateral impacted cerumen 04/15/2016  . Dermatitis 02/23/2016  . Bilateral carotid bruits 12/09/2015  . DNR (do not resuscitate) 12/09/2015  . Other microscopic hematuria 12/09/2015  . Hypothyroidism 12/08/2015  . Vitamin D deficiency 12/08/2015    Palliative Care Assessment & Plan   Patient Profile: 87 y.o.malewith past medical history of myelodysplastic syndrome, Alzheimer's dementia, and hypothyroidism presented to the emergency departmenton 1/30/2022with altered mental status.His wife reports decreased cognition over the past 3-4 days, decreased speech, and inability to perform ADLs. Per family report this is a sudden and drastic change from baseline.  ED Course:Platelets <5. CT scan revealed subdural hematoma. Hematology recommended 2 units of platelets. Neurosurgery does not recommend surgical intervention. Patient admitted to Internal Medicine TS for further management.  Assessment: Altered mental status Left-sided subdural hematoma Myelodysplastic syndrome Alzheimer's dementia  Recommendations/Plan:  Continue current medical treatment  Continue DNR/DNI as previously documented  Family are considering all dispo options at this time, no final decisions have been made  TOC notified of family's request for list of subacute rehab options  [facility] liaison notified of family's request for call to recieve more information on hospice  On discharge - family request [facility] for either Palliative Care or hospice services - will need referral placed once decision has been made  Family I feel are overwhelmed with information and options - recommend continued education and discussions  Ongoing GOC discussions pending clinical course and family needs  PMT will continue to follow holistically  Goals of Care and Additional Recommendations:  Limitations on Scope of Treatment: Full Scope Treatment  Code Status:    Code Status Orders  (From admission, onward)  Start     Ordered   03/08/20 1450  Do not attempt resuscitation (DNR)  Continuous       Question Answer Comment  In the event of cardiac or respiratory ARREST Do not call a "code blue"   In the event of cardiac or respiratory ARREST Do not perform Intubation, CPR, defibrillation or ACLS   In the event of cardiac or respiratory ARREST Use medication by any route, position, wound care, and other measures to relive pain and suffering. May use oxygen, suction and manual treatment of airway obstruction as needed for comfort.   Comments discussed with patient's Son      03/08/20 1452        Code Status History    Date Active Date Inactive Code Status Order ID Comments User Context   11/25/2018 2320 11/30/2018 1612 DNR [id number]  Barb Merino, MD ED   11/25/2018 1747 11/25/2018 2320 DNR [id number]  Barb Merino, MD ED   Advance Care Planning Activity       Prognosis:   Unable to determine  Discharge Planning:  To Be Determined  Care plan was discussed with primary RN, patient's family, Dr. Dareen Piano, [facility] hospice liaison  Thank you for allowing the Palliative Medicine Team to assist in the care of this patient.   Total Time 120 minutes Prolonged Time Billed  yes       Greater than 50%  of this time was spent counseling and coordinating care related to the above assessment and plan.  Lin Landsman, NP  Please contact Palliative Medicine Team phone at [phone] for questions and concerns.

## 2020-03-12 NOTE — Progress Notes (Signed)
Subjective:   Hospital day: 3  Overnight event:  Patient awake this morning, able to open milk bottle himelf and ate some applesauce. Up to chair yesterday. Daughter, [name], at bedside expressed interested in [facility], discussed PT will have to assess for qualification. Advised that patient would be unlike to tolerate intensity. Discussed options to go to SNF vs home PT. Concerned about ability to provide 24 hour care at home.  Patient's daughter expressed that she had bad experience with SNF and had to 'break him out' of the nursing facility last time he was there. Advised that family can look into more details of SNF to find a better fit for the patient. Family agrees that unlikely to take him home at this time.   Patient's daughter would like a family meeting with the palliative team to continue goal of care discussion.  Also discussed possibility of home hospitals and what services can be provided to help make a decision.  Objective:  Vital signs in last 24 hours: Vitals:   03/11/20 1643 03/11/20 2011 03/11/20 2324 03/12/20 0327  BP: (!) 109/55 (!) 109/57 109/66 (!) 109/59  Pulse: 73 78 69 69  Resp: 17 18 19 16   Temp: 97.8 F (36.6 C) 97.7 F (36.5 C) 98.8 F (37.1 C) 98 F (36.7 C)  TempSrc: Oral Oral Oral Oral  SpO2: 99% 99% 98% 98%   CBC Latest Ref Rng & Units 03/12/2020 03/11/2020 03/10/2020  WBC 4.0 - 10.5 K/uL 3.7(L) 4.4 4.8  Hemoglobin 13.0 - 17.0 g/dL 7.1(L) 7.7(L) 8.3(L)  Hematocrit 39.0 - 52.0 % 22.5(L) 23.1(L) 25.7(L)  Platelets 150 - 400 K/uL 24(LL) 13(LL) 19(LL)   CMP Latest Ref Rng & Units 03/12/2020 03/11/2020 03/10/2020  Glucose 70 - 99 mg/dL 115(H) 96 99  BUN 8 - 23 mg/dL 25(H) 28(H) 23  Creatinine 0.61 - 1.24 mg/dL 0.86 0.97 1.00  Sodium 135 - 145 mmol/L 132(L) 132(L) 130(L)  Potassium 3.5 - 5.1 mmol/L 3.7 4.5 3.6  Chloride 98 - 111 mmol/L 102 103 101  CO2 22 - 32 mmol/L 19(L) 19(L) 18(L)  Calcium 8.9 - 10.3 mg/dL 8.1(L) 8.3(L) 8.2(L)  Total Protein 6.5 - 8.1 g/dL -  - -  Total Bilirubin 0.3 - 1.2 mg/dL - - -  Alkaline Phos 38 - 126 U/L - - -  AST 15 - 41 U/L - - -  ALT 0 - 44 U/L - - -     Physical Exam  Physical Exam Constitutional:      General: He is not in acute distress.    Comments: Awake, alert and verbal.  Oriented to self not to place tor time, remembers birthday but not year  HENT:     Head: Normocephalic.  Eyes:     General:        Right eye: No discharge.        Left eye: No discharge.  Cardiovascular:     Rate and Rhythm: Normal rate and regular rhythm.  Pulmonary:     Effort: Pulmonary effort is normal. No respiratory distress.  Musculoskeletal:     Right lower leg: No edema.     Left lower leg: No edema.  Neurological:     Mental Status: He is alert.  Psychiatric:        Mood and Affect: Mood normal.    Assessment/Plan: TAKEO HARTS a 87 y.o.with pertinent PMH of myelodysplastic syndrome, alzheimer's dementia, and hypothyroidismwho presented with altered mental status,found to have a left-sided subdural hematoma.  His mental status is slowly improving.  Active Problems:   Myelodysplasia (myelodysplastic syndrome) (HCC)   Dementia without behavioral disturbance (HCC)   Thrombocytopenia ([facility])   Subdural hematoma ([facility])   Altered mental status-improving Left-sided subdural hematoma Patient continues to improve slowly today.  He is interactive and verbal and able to follow commands.  We will continue AED at this time and continue to monitor his improvement for the next few days.   Extensive discussion with patient's daughter this morning.  She expressed that she has had bad experience with past SNF.  However she also concerned that they are not able to take care of him at home either. She requests information about home hospice.  Will reach out to palliative team to have a family meeting about goals of care conversation and plan after discharge. -Appreciate palliative team recommendation -Continue IV  Keppra -Monitor mental status   Myelodysplastic syndrome Thrombocytopenia Heme/oncology was consulted and recommended keeping platelets above 20 given his subdural hematoma.  His platelets improved to 24 today status post 1 unit of platelet.  Hemoglobin dropped to 7.1, will repeat CBC tomorrow and transfuse if less than 7. -Appreciate oncology recommendation -Continue Amicar solution -CBC in a.m. -Transfuse if platelets < 20 or hemoglobin < 7   Hyponatremia Normal renal function. Likely due to poor appetite in combination with centrally acting process.  Serum osmolarity within normal limits suggest pseudohyponatremia. -MonitorBMP   Alzheimer -Continue Aricept    Hypothyroidism -Continue levothyroxine  Diet: Dysphagia 1 IVF:NA VTE:SCD CODE:DNR  Prior to Admission Living Arrangement:Home Anticipated Discharge Location:TBD Barriers to Discharge:Altered mental status  Gaylan Gerold, DO 03/12/2020, 6:16 AM Pager: [phone] After 5pm on weekdays and 1pm on weekends: On Call pager [phone]

## 2020-03-12 NOTE — Consult Note (Signed)
Physical Medicine and Rehabilitation Consult Reason for Consult: Altered mental status Referring Physician: [facility]   HPI: Shaun Hobbs is a 87 y.o. right-handed male with history of Covid 2021, myelodysplastic syndrome followed by Dr. Irene Limbo, Alzheimer's disease maintained on Aricept, hypothyroidism.  Per chart review patient lives with spouse.  Independent prior to admission with decreased safety awareness.  Presented 03/08/2020 with altered mental status and generalized weakness.  Wife had noted generalized decline over the past week.  Cranial CT scan showed small subdural hematoma along the left tentorium extending onto left falx and anteriorly into the medial aspect of the left middle cranial fossa as well as extending into the left occipital region measuring up to 4 mm thickness.  Additional small amount of subdural blood overlying the left occipital lobe posteriorly.  No mass-effect or midline shift.  Admission chemistries lactic acid 0.9, sodium 128, glucose 122, hemoglobin 7.4, platelets less than 5, blood cultures no growth to date, urinalysis negative nitrite.  Neurosurgery followed Dr. Venetia Constable with conservative care.  EEG negative for seizure during recording.  There was moderate diffuse encephalopathy.  Maintained on Keppra for seizure prophylaxis.  Dysphagia #1 thin liquid diet.  Palliative care consulted to establish goals of care.  Therapy evaluations completed due to patient's altered mental status decreased functional mobility recommendations for physical medicine rehab consult.  Pt's family reports that wife was basically setting pt up and monitoring him very closely at home with showering, toileting, etc- however basically was supervision level- he's min-mod Assist right now.  Also, wife reports before admission, for at least a few weeks, he was spitting out his food a lot and not eating much. Wouldn't swallow food -or least didn't swallow >50% of it. Doing better on current  puree diet.  Review of Systems  Constitutional: Positive for malaise/fatigue. Negative for chills and fever.  Eyes: Negative for blurred vision and double vision.  Respiratory: Negative for cough and shortness of breath.   Cardiovascular: Positive for leg swelling. Negative for chest pain and palpitations.  Gastrointestinal: Positive for constipation. Negative for heartburn, nausea and vomiting.  Genitourinary: Positive for urgency. Negative for dysuria, flank pain and hematuria.  Musculoskeletal: Positive for joint pain and myalgias.  Skin: Negative for rash.  Neurological: Positive for weakness.  Psychiatric/Behavioral: Positive for memory loss.  All other systems reviewed and are negative.  Past Medical History:  Diagnosis Date   Anemia    Cancer ([place]) 2008   Myelodysplastic syndrome   Dementia ([place])    Hypothyroidism    History reviewed. No pertinent surgical history. No family history on file. Social History:  reports that he quit smoking about 41 years ago. He has never used smokeless tobacco. He reports current alcohol use. He reports that he does not use drugs. Allergies:  Allergies  Allergen Reactions   Sulfa Antibiotics Other (See Comments)    Patient reports: reaction in past    Medications Prior to Admission  Medication Sig Dispense Refill   Cholecalciferol (VITAMIN D3 PO) Take 1,000 Units by mouth daily.      donepezil (ARICEPT) 10 MG tablet Take 10 mg by mouth daily.     levothyroxine (SYNTHROID, LEVOTHROID) 100 MCG tablet Take 100 mcg by mouth daily before breakfast.     tamsulosin (FLOMAX) 0.4 MG CAPS capsule Take 0.4 mg by mouth at bedtime.     tranexamic acid (LYSTEDA) 650 MG TABS tablet Take 1 tablet (650 mg total) by mouth 2 (two) times daily. 180 tablet  0   triamcinolone lotion (KENALOG) 0.1 % Apply 1 application topically 3 (three) times daily. 120 mL 2   mupirocin ointment (BACTROBAN) 2 % Apply 1 application topically 2 (two) times daily. To  area of induration on forearm (Patient not taking: No sig reported) 22 g 2    Home: Home Living Family/patient expects to be discharged to:: Private residence Living Arrangements: Spouse/significant other Available Help at Discharge: Available 24 hours/day Type of Home: House Home Access: Stairs to enter [employer] of Steps: 1 Home Layout: One level Bathroom Shower/Tub: Tub/shower Surveyor, minerals: Standard Bathroom Accessibility: Yes Home Equipment: Environmental consultant - 2 wheels,Grab bars - toilet,Grab bars - tub/shower,Other (comment),Shower seat (walking stick) Additional Comments: pt with baseline dementia; wife states pt can get up and dressed and walk around house without assist. S needed when out of house. wife performs Production designer, theatre/television/film and medicine management  Functional History: Prior Function Level of Independence: Independent Functional Status:  Mobility: Bed Mobility Overal bed mobility: Needs Assistance Bed Mobility: Supine to Sit Rolling: Min assist Supine to sit: Min assist,HOB elevated Sit to supine: Supervision General bed mobility comments: pt does impulsively attempt to lay down once during session when sitting at the edge of bed Transfers Overall transfer level: Needs assistance Equipment used: Rolling walker (2 wheeled) Transfers: Sit to/from Stand Sit to Stand: Min assist,+2 physical assistance Stand pivot transfers: Min assist General transfer comment: pt requires verbal and tactile cues. Needs tactile cueing to initiate forward rock into transfers. Pt with difficult time following commands for hand placement Ambulation/Gait Ambulation/Gait assistance: Min assist Gait Distance (Feet): 70 Feet Assistive device: Rolling walker (2 wheeled) Gait Pattern/deviations: Step-to pattern,Trunk flexed General Gait Details: pt with short step-to gait, bumps into one object on R side, improved following of direction with visual cues Gait velocity:  reduced Gait velocity interpretation: <1.31 ft/sec, indicative of household ambulator    ADL: ADL Overall ADL's : Needs assistance/impaired Upper Body Dressing : Minimal assistance,Sitting Upper Body Dressing Details (indicate cue type and reason): Donned anterior hospital gown. Lower Body Dressing: Minimal assistance,Sit to/from stand Lower Body Dressing Details (indicate cue type and reason): Able to doff/don footwear seated EOB with Min gaurd and more than increased time. Toilet Transfer: Minimal Health visitor Details (indicate cue type and reason): To BSC with use of RW. Cues for hand placement. Patient requires increased time. Toileting- Clothing Manipulation and Hygiene: Moderate assistance Toileting - Clothing Manipulation Details (indicate cue type and reason): Patient incontinent of bowel prior to toiet transfer but seemingly unaware. Mod A for hygiene/clothing management. Functional mobility during ADLs: Minimal assistance,Rolling walker General ADL Comments: Min A for functional mobility with HHA vs RW. Ambulates without AD at baseline.  Cognition: Cognition Overall Cognitive Status: Impaired/Different from baseline Orientation Level: Oriented to person Cognition Arousal/Alertness: Awake/alert Behavior During Therapy: Flat affect Overall Cognitive Status: Impaired/Different from baseline Area of Impairment: Attention,Memory,Following commands,Safety/judgement,Awareness,Problem solving Current Attention Level: Focused Memory: Decreased recall of precautions,Decreased short-term memory Following Commands: Follows one step commands with increased time Safety/Judgement: Decreased awareness of safety,Decreased awareness of deficits Awareness: Intellectual Problem Solving: Slow processing,Requires verbal cues,Requires tactile cues General Comments: Patient able to state name and DOB. Disoriented to place and situation. Patient follows 1-step verbal commands with  increased time and 80% accuracy.  Blood pressure (!) 107/50, pulse 63, temperature 97.6 F (36.4 C), temperature source Oral, resp. rate 20, SpO2 100 %. Physical Exam Vitals and nursing note reviewed. Exam conducted with a chaperone present.  Constitutional:  Comments: Asleep most of time in room, wife and daughter in law and NP for Palliative care in room, kept messing with IV and heart monitor, not agitated, but restless, even while sleeping, NAD  HENT:     Head: Normocephalic and atraumatic.     Comments: Wasn't able to get him to open mouth to assess oropharynx    Right Ear: External ear normal.     Left Ear: External ear normal.     Nose: Nose normal. No congestion.     Mouth/Throat:     Mouth: Mucous membranes are dry.  Eyes:     Comments: Grossly, EOMI as he followed me around the bed- no nystagmus seen  Cardiovascular:     Comments: RRR- no JVD Pulmonary:     Comments: CTA B/L- no W/R/R- good air movement Abdominal:     Comments: Soft, NT, ND, (+)BS - almost cachetic  Musculoskeletal:     Cervical back: Normal range of motion and neck supple.     Comments: Pt able to do some automatic motions- like High five x1 with a lot of cues.  Shook my hand initially and grasped my hand automatically, however couldn't get him to participate in strength exam- did wiggle toes spontaneously, not to direction x1- and was moving all 4 extremities automatically.   Skin:    Comments: IV R forearm- looks OK Ecchymoses on arms B/L  Neurological:     Comments: Patient is alert.  No acute distress.  Oriented to person but not place or time.  Follows simple commands  Did not follow more than a few commands for me- was able to say Hi and Bye for me, but no other speech heard- just stared at me, when asked his name- did respond to name by opening eyes.    Psychiatric:     Comments: Flat affect-      Results for orders placed or performed during the hospital encounter of 03/08/20 (from the  past 24 hour(s))  CBC     Status: Abnormal   Collection Time: 03/12/20  3:41 AM  Result Value Ref Range   WBC 3.7 (L) 4.0 - 10.5 K/uL   RBC 2.27 (L) 4.22 - 5.81 MIL/uL   Hemoglobin 7.1 (L) 13.0 - 17.0 g/dL   HCT 22.5 (L) 39.0 - 52.0 %   MCV 99.1 80.0 - 100.0 fL   MCH 31.3 26.0 - 34.0 pg   MCHC 31.6 30.0 - 36.0 g/dL   RDW 18.2 (H) 11.5 - 15.5 %   Platelets 24 (LL) 150 - 400 K/uL   nRBC 0.0 0.0 - 0.2 %  Basic metabolic panel     Status: Abnormal   Collection Time: 03/12/20  3:41 AM  Result Value Ref Range   Sodium 132 (L) 135 - 145 mmol/L   Potassium 3.7 3.5 - 5.1 mmol/L   Chloride 102 98 - 111 mmol/L   CO2 19 (L) 22 - 32 mmol/L   Glucose, Bld 115 (H) 70 - 99 mg/dL   BUN 25 (H) 8 - 23 mg/dL   Creatinine, Ser 0.86 0.61 - 1.24 mg/dL   Calcium 8.1 (L) 8.9 - 10.3 mg/dL   GFR, Estimated >60 >60 mL/min   Anion gap 11 5 - 15   No results found.   Assessment/Plan: Diagnosis: L SDH- nontraumatic- due to Plts of ,5K 1. Does the need for close, 24 hr/day medical supervision in concert with the patient's rehab needs make it unreasonable for this patient to be  served in a less intensive setting? Potentially 2. Co-Morbidities requiring supervision/potential complications: Myelodysplastic syndrome, expressive/receptive aphasia?, encephalopathy, alzhemier's dementia, pancytopenia requiring transfusions q2 weeks 3. Due to bladder management, bowel management, safety, skin/wound care, disease management, medication administration, pain management and patient education, does the patient require 24 hr/day rehab nursing? Potentially 4. Does the patient require coordinated care of a physician, rehab nurse, therapy disciplines of PT, OT and SLP to address physical and functional deficits in the context of the above medical diagnosis(es)? Potentially Addressing deficits in the following areas: balance, endurance, locomotion, strength, transferring, bowel/bladder control, bathing, dressing, feeding,  grooming, toileting, cognition, speech, language and swallowing 5. Can the patient actively participate in an intensive therapy program of at least 3 hrs of therapy per day at least 5 days per week? Potentially 6. The potential for patient to make measurable gains while on inpatient rehab is fair 7. Anticipated functional outcomes upon discharge from inpatient rehab are min assist  with PT, min assist with OT, min assist and mod assist with SLP. 8. Estimated rehab length of stay to reach the above functional goals is: 2-3 weeks 9. Anticipated discharge destination: Home 10. Overall Rehab/Functional Prognosis: fair  RECOMMENDATIONS: This patient's condition is appropriate for continued rehabilitative care in the following setting: CIR and SNF Patient has agreed to participate in recommended program. Potentially Note that insurance prior authorization may be required for reimbursement for recommended care.  Comment:  1. Spoke to wife and daughter in Social worker and NP from Palliative care, Hospital doctor, about rehab- the difference between CIR and SNF- 3 hrs/day vs 1 maybe 2 hours of therapy per day, explained what moderate, minimal assist vs supervision mean and how he's min-mod A right now with therapy, which is at best- of note, slept the rest of afternoon after PT session.  2. If pt went to CIR, he's low level- we might get 3 weeks, based on his diagnosis- maybe a hair more or less, but the average length of stay is 2 weeks in CIR.  3. Also explained that pt will NOT be back at baseline when he went home from CIR due to just needing time- We would be happy to take him, if the plan is for family to take him home after 2-3 weeks. However, if their plan is NOT to take him home- knowing he's going to decline over the next few months medically, SNF placement would be more appropriate.  4. I understand they are unhappy with a previous SNF experience- we don't need to place at the same place- did explain each is  different.  5. Con't Puree diet -D1 with thins- pt tolerating better.  6. Also went over EEG results with pt/family- explained his cognition will likely be the biggest limiting factor in his function.  7. Plan for admission vs SNF- per admissions coordinators- explained they can d/w Britta Mccreedy tomorrow to discuss their decision.  8. Thank you for this consult.   I spent a total of 120 minutes total on this consult- >50% coordination of care- 1hr 10 minutes in room with pt, family; 25 minutes reviewing chart and records; and 25 minutes typing up note, after speaking with nurse.     Mcarthur Rossetti Angiulli, PA-C 03/12/2020

## 2020-03-12 NOTE — Progress Notes (Signed)
Physical Therapy Treatment Patient Details Name: Shaun Hobbs MRN: [id number] DOB: [birth year] Today's Date: 03/12/2020    History of Present Illness Shaun Hobbs is a 87 y.o. with pertinent PMH of myelodysplastic syndrome, alzheimer's dementia, and hypothyroidism  who presented with altered mental status, found to have a left-sided subdural hematoma.    PT Comments    Pt tolerates treatment well, progressing to ambulation this session. Pt continues to require multi-modal cueing and increased time for processing with aspects of expressive and receptive aphasia noted. Pt needs minA to power up into standing due to LE weakness and demonstrates impairments in device management which place him at an increased risk for falls. Due to pt's high falls risk and myelodysplastic syndrome the patient is unsafe to return home. Pt participates well during session and demonstrates good endurance at this time. Pt's family in agreement for possible CIR admission. PT continues to recommend CIR at this time as the pt demonstrates the potential to return to a supervision level of mobility.  Follow Up Recommendations  CIR     Equipment Recommendations  Wheelchair (measurements PT)    Recommendations for Other Services       Precautions / Restrictions Precautions Precautions: Fall Restrictions Weight Bearing Restrictions: No    Mobility  Bed Mobility Overal bed mobility: Needs Assistance Bed Mobility: Supine to Sit     Supine to sit: Min assist;HOB elevated     General bed mobility comments: pt does impulsively attempt to lay down once during session when sitting at the edge of bed  Transfers Overall transfer level: Needs assistance Equipment used: Rolling walker (2 wheeled) Transfers: Sit to/from Stand Sit to Stand: Min assist;+2 physical assistance         General transfer comment: pt requires verbal and tactile cues. Needs tactile cueing to initiate forward rock into transfers. Pt with  difficult time following commands for hand placement  Ambulation/Gait Ambulation/Gait assistance: Min assist Gait Distance (Feet): 70 Feet Assistive device: Rolling walker (2 wheeled) Gait Pattern/deviations: Step-to pattern;Trunk flexed Gait velocity: reduced Gait velocity interpretation: <1.31 ft/sec, indicative of household ambulator General Gait Details: pt with short step-to gait, bumps into one object on R side, improved following of direction with visual cues   Stairs             Wheelchair Mobility    Modified Rankin (Stroke Patients Only) Modified Rankin (Stroke Patients Only) Pre-Morbid Rankin Score: No significant disability Modified Rankin: Moderately severe disability     Balance Overall balance assessment: Needs assistance Sitting-balance support: No upper extremity supported;Feet supported Sitting balance-Leahy Scale: Fair     Standing balance support: Single extremity supported;Bilateral upper extremity supported Standing balance-Leahy Scale: Poor Standing balance comment: reliant on UE support for stability                            Cognition Arousal/Alertness: Awake/alert Behavior During Therapy: Flat affect Overall Cognitive Status: Impaired/Different from baseline Area of Impairment: Attention;Memory;Following commands;Safety/judgement;Awareness;Problem solving                   Current Attention Level: Focused Memory: Decreased recall of precautions;Decreased short-term memory Following Commands: Follows one step commands with increased time Safety/Judgement: Decreased awareness of safety;Decreased awareness of deficits Awareness: Intellectual Problem Solving: Slow processing;Requires verbal cues;Requires tactile cues        Exercises      General Comments General comments (skin integrity, edema, etc.): VSS stable on RA. Pt  is able to verbalize name but otherwise tends to respond with yes/no head nods. Pt with  increased response time to verbal cues, seems to do better with visual or tactile cues.      Pertinent Vitals/Pain Pain Assessment: Faces Faces Pain Scale: No hurt    Home Living                      Prior Function            PT Goals (current goals can now be found in the care plan section) Acute Rehab PT Goals Patient Stated Goal: to go to CIR Progress towards PT goals: Progressing toward goals    Frequency    Min 4X/week      PT Plan Current plan remains appropriate    Co-evaluation              AM-PAC PT "6 Clicks" Mobility   Outcome Measure  Help needed turning from your back to your side while in a flat bed without using bedrails?: A Little Help needed moving from lying on your back to sitting on the side of a flat bed without using bedrails?: A Little Help needed moving to and from a bed to a chair (including a wheelchair)?: A Little Help needed standing up from a chair using your arms (e.g., wheelchair or bedside chair)?: A Little Help needed to walk in hospital room?: A Little Help needed climbing 3-5 steps with a railing? : A Lot 6 Click Score: 17    End of Session Equipment Utilized During Treatment: Gait belt Activity Tolerance: Patient tolerated treatment well Patient left: in chair;with call bell/phone within reach;with chair alarm set;with family/visitor present Nurse Communication: Mobility status PT Visit Diagnosis: Unsteadiness on feet (R26.81)     Time: 0981-1914 PT Time Calculation (min) (ACUTE ONLY): 38 min  Charges:  $Gait Training: 23-37 mins $Therapeutic Activity: 8-22 mins                     Zenaida Niece, PT, DPT Acute Rehabilitation Pager: [phone]    Zenaida Niece 03/12/2020, 9:52 AM

## 2020-03-12 NOTE — Progress Notes (Signed)
Rehab Admissions Coordinator Note:  Per PT recommendation, this patient was screened by Raechel Ache for appropriateness for an Inpatient Acute Rehab Consult.  At this time, we are recommending an Inpatient Rehab consult. AC will contact attending service to request consult order.  Raechel Ache 03/12/2020, 10:13 AM  I can be reached at [phone].

## 2020-03-12 NOTE — Progress Notes (Signed)
  Speech Language Pathology Treatment: Dysphagia  Patient Details Name: Shaun Hobbs MRN: [id number] DOB: 03/01/1928 Today's Date: 03/12/2020 Time: 8841-6606 SLP Time Calculation (min) (ACUTE ONLY): 20 min  Assessment / Plan / Recommendation Clinical Impression  SLP followed up for diet tolerance. Pts spouse, daughter, and daughter in law at bedside. Per family, pt has had improved PO intake and better tolerance of puree textures. Per spouse report, pt spitting out small pieces of finely chopped textures. Assessed with puree and finely chopped textures this date, pt did expectorate small piece of blueberry. No overt s/sx of aspiration with any PO. Provided education to family regarding oral care and safe swallowing strategies for family to assist with feeding. SLP to follow up.    HPI HPI: 87 y/o M with a PMHx of myelodysplastic syndrome, alzheimer's dementia, and hypothyroidism presented to the ED today with his wife for decreased cognition over the past 3-4 days that acutely worsened over the past day. His wife noted decreased speech,conversation and ability to perform his ADL's over the past week. CT of head noted with Small subdural hematoma along the LEFT tentorium, extending onto LEFT falx and anteriorly into the medial aspect of the LEFT middle cranial fossa as well as extending into the LEFT occipital region, measuring up to 4 mm thick. Additional small amount of subdural blood overlying the LEFT occipital lobe posteriorly.      SLP Plan  Continue with current plan of care       Recommendations  Diet recommendations: Dysphagia 1 (puree);Thin liquid Liquids provided via: Cup;Straw Medication Administration: Crushed with puree Supervision: Staff to assist with self feeding;Trained caregiver to feed patient;Full supervision/cueing for compensatory strategies Compensations: Minimize environmental distractions;Slow rate;Small sips/bites;Lingual sweep for clearance of pocketing Postural  Changes and/or Swallow Maneuvers: Seated upright 90 degrees                Oral Care Recommendations: Oral care BID SLP Visit Diagnosis: Dysphagia, oral phase (R13.11);Dysphagia, unspecified (R13.10) Plan: Continue with current plan of care       [place], [place]   03/12/2020, 12:18 PM

## 2020-03-12 NOTE — TOC Progression Note (Signed)
Transition of Care [facility]) - Progression Note    Patient Details  Name: Shaun Hobbs MRN: [id number] Date of Birth: September 02, 1928  Transition of Care [facility]) CM/SW Contact  Pollie Friar, RN Phone Number: 03/12/2020, 3:00 PM  Clinical Narrative:    New recommendation is for CIR. CM met with the patient and his family. They are hoping for a CIR admission.  CM has provided them resources for caregivers at home.  TOC following.   Expected Discharge Plan: IP Rehab Facility Barriers to Discharge: Continued Medical Work up  Expected Discharge Plan and Services Expected Discharge Plan: [place]     Post Acute Care Choice: IP Rehab Living arrangements for the past 2 months: Single Family Home                                       Social Determinants of Health (SDOH) Interventions    Readmission Risk Interventions No flowsheet data found.

## 2020-03-13 DIAGNOSIS — G309 Alzheimer's disease, unspecified: Secondary | ICD-10-CM | POA: Diagnosis not present

## 2020-03-13 DIAGNOSIS — D469 Myelodysplastic syndrome, unspecified: Secondary | ICD-10-CM | POA: Diagnosis not present

## 2020-03-13 DIAGNOSIS — S065X9A Traumatic subdural hemorrhage with loss of consciousness of unspecified duration, initial encounter: Secondary | ICD-10-CM | POA: Diagnosis not present

## 2020-03-13 DIAGNOSIS — Z515 Encounter for palliative care: Secondary | ICD-10-CM

## 2020-03-13 DIAGNOSIS — D696 Thrombocytopenia, unspecified: Secondary | ICD-10-CM | POA: Diagnosis not present

## 2020-03-13 DIAGNOSIS — R339 Retention of urine, unspecified: Secondary | ICD-10-CM

## 2020-03-13 DIAGNOSIS — Z7189 Other specified counseling: Secondary | ICD-10-CM

## 2020-03-13 DIAGNOSIS — Z789 Other specified health status: Secondary | ICD-10-CM

## 2020-03-13 LAB — PREPARE RBC (CROSSMATCH)

## 2020-03-13 LAB — CBC
HCT: 21.6 % — ABNORMAL LOW (ref 39.0–52.0)
Hemoglobin: 6.8 g/dL — CL (ref 13.0–17.0)
MCH: 31.5 pg (ref 26.0–34.0)
MCHC: 31.5 g/dL (ref 30.0–36.0)
MCV: 100 fL (ref 80.0–100.0)
Platelets: 17 10*3/uL — CL (ref 150–400)
RBC: 2.16 MIL/uL — ABNORMAL LOW (ref 4.22–5.81)
RDW: 17.9 % — ABNORMAL HIGH (ref 11.5–15.5)
WBC: 3.9 10*3/uL — ABNORMAL LOW (ref 4.0–10.5)
nRBC: 0 % (ref 0.0–0.2)

## 2020-03-13 LAB — CULTURE, BLOOD (ROUTINE X 2): Culture: NO GROWTH

## 2020-03-13 LAB — HEMOGLOBIN AND HEMATOCRIT, BLOOD
HCT: 22.6 % — ABNORMAL LOW (ref 39.0–52.0)
Hemoglobin: 7.7 g/dL — ABNORMAL LOW (ref 13.0–17.0)

## 2020-03-13 LAB — BASIC METABOLIC PANEL
Anion gap: 7 (ref 5–15)
BUN: 20 mg/dL (ref 8–23)
CO2: 22 mmol/L (ref 22–32)
Calcium: 8.4 mg/dL — ABNORMAL LOW (ref 8.9–10.3)
Chloride: 104 mmol/L (ref 98–111)
Creatinine, Ser: 0.82 mg/dL (ref 0.61–1.24)
GFR, Estimated: >60 mL/min (ref >60)
Glucose, Bld: 109 mg/dL — ABNORMAL HIGH (ref 70–99)
Potassium: 3.7 mmol/L (ref 3.5–5.1)
Sodium: 133 mmol/L — ABNORMAL LOW (ref 135–145)

## 2020-03-13 MED ORDER — TAMSULOSIN HCL 0.4 MG PO CAPS
0.4000 mg | ORAL_CAPSULE | Freq: Every day | ORAL | Status: DC
  Administered 2020-03-13 – 2020-03-17 (×5): 0.4 mg via ORAL
  Filled 2020-03-13 (×5): qty 1

## 2020-03-13 MED ORDER — SODIUM CHLORIDE 0.9% IV SOLUTION: Freq: Once | INTRAVENOUS | Status: DC

## 2020-03-13 MED ORDER — LEVETIRACETAM 750 MG PO TABS
750.0000 mg | ORAL_TABLET | Freq: Two times a day (BID) | ORAL | Status: DC
  Administered 2020-03-13 – 2020-03-18 (×10): 750 mg via ORAL
  Filled 2020-03-13 (×10): qty 1

## 2020-03-13 NOTE — Progress Notes (Signed)
Patient having discomfort at the suprapubic area and said that he need to uinate.  He has primofit connected to the suction,only minimal amount of urine collected in the suction canister.  Bladder is hard and palpable.  Bladder scan 1000 cc.  Dr. Dareen Piano notified and in and out cath done with 16FR cath using aseptic technique.  1600cc of clear yellow urine drained.  Will continue to monitor.

## 2020-03-13 NOTE — Progress Notes (Signed)
Daily Progress Note   Patient Name: Shaun Hobbs       Date: 03/13/2020 DOB: December 13, 1928  Age: 87 y.o. MRN#: [id number] Attending Physician: Aldine Contes, MD Primary Care Physician: Chesley Noon, MD Admit Date: 03/08/2020  Reason for Consultation/Follow-up: continued GOC discussion  Subjective: I met at bedside with [name] and [name] (daughter-in-law). Discussed disposition plans. At this point, family has decided that CIR may be the best option. They are hopeful that more intense rehab will give him the best opportunity to regain strength and functional ability. I let them know I agree with this plan, especially considering his previous relatively high functional status. Discussed that his cognitive status may be the biggest limiting factor with his functional status at this time, as his brain needs additional time to heal.   Discussed that even with CIR it is unlikely patient will return to his previous baseline in the setting of his progressive underlying illness. However, it is not unreasonable to hope he can return to a level close to his previous baseline, considering his improvement since admission. Family verbalizes understanding he will not return fully to his previous baseline but are hopeful he will be improve enough to be able to return home after CIR. They would likely do home health with PT at this point.   Discussed that at some point in the future, patient would need hospice services due to his progressive underlying conditions. Family verbalizes acceptance of this. They understand there will come a time when patient will no longer benefit from aggressive medical interventions, including transfusion. In the meantime, I offered and explained the option for an outpatient  palliative referral to check-in periodically with patient and family and continue goals of care discussions after he is discharged from [facility]. Discussed that outpatient palliative can also help with transition of care if his condition declines in the future. Family agrees that outpatient palliative would be helpful.    Length of Stay: 5  Current Medications: Scheduled Meds:  . sodium chloride   Intravenous Once  . sodium chloride   Intravenous Once  . aminocaproic acid  1,250 mg Oral TID  . chlorhexidine  15 mL Mouth Rinse BID  . donepezil  10 mg Oral Daily  . levETIRAcetam  750 mg Oral BID  . levothyroxine  100 mcg Oral  QAC breakfast  . mouth rinse  15 mL Mouth Rinse BID  . tamsulosin  0.4 mg Oral QHS    Continuous Infusions: . sodium chloride 10 mL/hr (03/11/20 2220)    PRN Meds: acetaminophen **OR** acetaminophen, polyethylene glycol  Physical Exam Vitals reviewed.  Constitutional:      General: He is sleeping. He is not in acute distress.    Comments: Appears calm and comfortable  Pulmonary:     Effort: Pulmonary effort is normal.             Vital Signs: BP (!) 119/52   Pulse (!) 59   Temp 98.3 F (36.8 C) (Oral)   Resp 17   SpO2 97%  SpO2: SpO2: 97 % O2 Device: O2 Device: Room Air O2 Flow Rate:    Intake/output summary:   Intake/Output Summary (Last 24 hours) at 03/13/2020 1708 Last data filed at 03/13/2020 0930 Gross per 24 hour  Intake 891.33 ml  Output 2350 ml  Net -1458.67 ml   LBM: Last BM Date: 03/12/20 Baseline Weight:   Most recent weight:         Palliative Assessment/Data: PPS 50%       Palliative Care Assessment & Plan   Patient Profile: 87 y.o.malewith past medical history of transfusion dependent myelodysplastic syndrome, Alzheimer's dementia, and hypothyroidism presented to the emergency departmenton 1/30/2022with altered mental status.His wife reports decreased cognition over the past 3-4 days, decreased speech, and inability  to perform ADLs. Per family report this is a sudden and drastic change from baseline.  ED Course:Platelets <5. CT scan revealed subdural hematoma. Hematology recommended 2 units of platelets. Neurosurgery does not recommend surgical intervention. Patient admitted to Internal Medicine TS for further management.  Assessment: Altered mental status Left-sided subdural hematoma Myelodysplastic syndrome Alzheimer's dementia  Recommendations/Plan:  DNR/DNI status as previosly documented  Family is hopeful for continued improvement and has decided CIR is the best option at this point  Ultimate goal of care is for patient to return home  Family is open to hospice care at some point in the future, in the event of further decline  Recommend outpatient palliative follow up after patient discharged from CIR  PMT will continue to follow  Goals of Care and Additional Recommendations:  Limitations on Scope of Treatment: Full Scope Treatment  Prognosis:   Unable to determine  Discharge Planning:  Likely CIR    Thank you for allowing the Palliative Medicine Team to assist in the care of this patient.   Total Time 35 minutes Prolonged Time Billed  no       Greater than 50%  of this time was spent counseling and coordinating care related to the above assessment and plan.  Lavena Bullion, NP  Please contact Palliative Medicine Team phone at [phone] for questions and concerns.

## 2020-03-13 NOTE — Progress Notes (Signed)
CRITICAL VALUE STICKER  CRITICAL VALUE: Results for Shaun, Hobbs (MRN [id number]) as of 03/13/2020 05:25  Ref. Range 03/13/2020 04:31  Hemoglobin Latest Ref Range: 13.0 - 17.0 g/dL 6.8 (LL)   Results for Shaun, Hobbs (MRN [id number]) as of 03/13/2020 05:25  Ref. Range 03/13/2020 04:31  Platelets Latest Ref Range: 150 - 400 K/uL 17 (LL)   RECEIVER (on-site recipient of call):  DATE & TIME NOTIFIED: 03/13/20 0523  MD NOTIFIED: Konrad Penta  TIME OF NOTIFICATION: 3112  RESPONSE: Paged.

## 2020-03-13 NOTE — Progress Notes (Signed)
Inpatient Rehabilitation Admissions Coordinator  I met at bedside with patient's wife and daughter in law, [name]. We discussed goals and expectations of a possible CIR admit. Included discussion that patient would not return to his previous supervision level due to his medical issues. Would expect him to need physical assist and that wife would need additional caregiver support to provide this level. They would prefer CIR here at [facility]. I do not have a bed available through this weekend with limited beds available next week. I encouraged them to also look into other rehab venue options, for we may not have a bed available when patient felt medically ready to discharge. I will follow up Monday. I have updated acute team and TOC .  Danne Baxter, RN, MSN Rehab Admissions Coordinator [phone] 03/13/2020 12:21 PM

## 2020-03-13 NOTE — TOC Progression Note (Signed)
Transition of Care [facility]) - Progression Note    Patient Details  Name: Shaun Hobbs MRN: [id number] Date of Birth: [birth year]  [facility] CM/SW Contact  Pollie Friar, RN Phone Number: 03/13/2020, 2:21 PM  Clinical Narrative:    CIR will not have a bed for the patient this weekend and unsure of when next week. Family agreeable to looking at other IR facilities. They would like a facility that can provide the blood transfusions. CM called [facility] and [facility] and [facility] can do the transfusions but [facility] can not. Family agreeable to having pts information faxed to [facility].  TOC following for bed availability at either CIR or [facility].    Expected Discharge Plan: IP Rehab Facility Barriers to Discharge: Continued Medical Work up  Expected Discharge Plan and Services Expected Discharge Plan: [employer]     Post Acute Care Choice: IP Rehab Living arrangements for the past 2 months: Single Family Home                                       Social Determinants of Health (SDOH) Interventions    Readmission Risk Interventions No flowsheet data found.

## 2020-03-13 NOTE — Plan of Care (Signed)

## 2020-03-13 NOTE — Progress Notes (Addendum)
OT Cancellation Note  Patient Details Name: ESSIE GEHRET MRN: [id number] DOB: Aug 29, 1928   Cancelled Treatment:    Reason Eval/Treat Not Completed: Medical issues which prohibited therapy. At time of first attempt, RN deferred 2/2 patient with urinary retention awaiting intervention. At time of 2nd attempt, patient/family in meeting with provider. OT to check back as time allows.   Gloris Manchester OTR/L Supplemental OT, Department of rehab services [phone]  Khameron Gruenwald R H. 03/13/2020, 9:56 AM

## 2020-03-13 NOTE — Progress Notes (Signed)
Physical Therapy Treatment Patient Details Name: Shaun Hobbs MRN: [id number] DOB: 1928-09-07 Today's Date: 03/13/2020    History of Present Illness Shaun Hobbs is a 87 y.o. with pertinent PMH of myelodysplastic syndrome, alzheimer's dementia, and hypothyroidism  who presented with altered mental status, found to have a left-sided subdural hematoma.    PT Comments    Pt tolerates treatment well with improved level of alertness and communication with this PT. Pt also demonstrates improved ambulation and activity tolerance. Pt does bump into multiple objects on R side and tends to drift toward R, but is able to correct himself and navigate around other objects when visually attending to them. Pt continues to make steady progress toward goals of returning to a supervision level of mobility. Pt will benefit from further acute PT POC to improve LE strength, transfer quality, and dynamic balance. PT continues to recommend CIR at this time.   Follow Up Recommendations  CIR (SNF vs HHPT with hired care if pt is unable to be admitted to [facility])     Equipment Recommendations  3in1 (PT)    Recommendations for Other Services       Precautions / Restrictions Precautions Precautions: Fall Precaution Comments: incontinent of stool Restrictions Weight Bearing Restrictions: No    Mobility  Bed Mobility Overal bed mobility: Needs Assistance Bed Mobility: Supine to Sit     Supine to sit: Min guard;HOB elevated     General bed mobility comments: use of rails  Transfers Overall transfer level: Needs assistance Equipment used: Rolling walker (2 wheeled) Transfers: Sit to/from Stand Sit to Stand: Min assist         General transfer comment: PT provides cues for hand placement  Ambulation/Gait Ambulation/Gait assistance: Min assist Gait Distance (Feet): 150 Feet Assistive device: Rolling walker (2 wheeled) Gait Pattern/deviations: Step-through pattern Gait velocity: reduced Gait  velocity interpretation: <1.8 ft/sec, indicate of risk for recurrent falls General Gait Details: pt with slowed step through gait, tends to drift toward R side. Mild R inattention vs poor attention in general as pt bumps into 2 objects on left. Pt nearly bumps into 2-3 more objects on left but when he is attending to objects in his path he is able to navigate safely around them   Stairs             Wheelchair Mobility    Modified Rankin (Stroke Patients Only) Modified Rankin (Stroke Patients Only) Pre-Morbid Rankin Score: No significant disability Modified Rankin: Moderately severe disability     Balance Overall balance assessment: Needs assistance Sitting-balance support: No upper extremity supported;Feet supported Sitting balance-Leahy Scale: Fair     Standing balance support: Single extremity supported;Bilateral upper extremity supported Standing balance-Leahy Scale: Poor Standing balance comment: reliant on UE support for stability                            Cognition Arousal/Alertness: Awake/alert Behavior During Therapy: Flat affect (does smile at times during session when looking at wife) Overall Cognitive Status: Impaired/Different from baseline Area of Impairment: Attention;Memory;Following commands;Safety/judgement;Awareness;Problem solving                   Current Attention Level: Focused Memory: Decreased recall of precautions;Decreased short-term memory Following Commands: Follows one step commands with increased time Safety/Judgement: Decreased awareness of safety;Decreased awareness of deficits Awareness: Intellectual Problem Solving: Slow processing;Requires verbal cues;Requires tactile cues        Exercises  General Comments General comments (skin integrity, edema, etc.): VSS on RA, pt does speak more frequently during session but still does not respond to all PT questions. 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      Pertinent Vitals/Pain Pain Assessment: Faces Pain Score: 0-No pain Faces Pain Scale: No hurt    Home Living                      Prior Function            PT Goals (current goals can now be found in the care plan section) Acute Rehab PT Goals Patient Stated Goal: to go to CIR, return to a supervision level Progress towards PT goals: Progressing toward goals    Frequency    Min 4X/week      PT Plan Current plan remains appropriate    Co-evaluation              AM-PAC PT "6 Clicks" Mobility   Outcome Measure  Help needed turning from your back to your side while in a flat bed without using bedrails?: A Little Help needed moving from lying on your back to sitting on the side of a flat bed without using bedrails?: A Little Help needed moving to and from a bed to a chair (including a wheelchair)?: A Little Help needed standing up from a chair using your arms (e.g., wheelchair or bedside chair)?: A Little Help needed to walk in hospital room?: A Little Help needed climbing 3-5 steps with a railing? : A Lot 6 Click Score: 17    End of Session Equipment Utilized During Treatment: Gait belt Activity Tolerance: Patient tolerated treatment well Patient left: in chair;with call bell/phone within reach;with chair alarm set;with family/visitor present Nurse Communication: Mobility status PT Visit Diagnosis: Unsteadiness on feet (R26.81)     Time: 0454-0981 PT Time Calculation (min) (ACUTE ONLY): 30 min  Charges:  $Gait Training: 8-22 mins $Therapeutic Activity: 8-22 mins                     Zenaida Niece, PT, DPT Acute Rehabilitation Pager: [phone]    Zenaida Niece 03/13/2020, 12:51 PM

## 2020-03-13 NOTE — Progress Notes (Signed)
Subjective:   Hospital day: 5  Overnight event: no acute event   Patient evaluated at bedside. He is sitting up in bedside chair with wife and daughter at bedside. Wife is feeding him and reports that he is feeling well.  Denies dysphagia.  Daughter and wife note that they are having trouble deciding disposition following hospital discharge.  Wife reports that the patient is able to recognize family and is more interactive with the family around. She expresses that she would like to continue supporting him.   Objective:  Vital signs in last 24 hours: Vitals:   03/12/20 1646 03/12/20 1936 03/13/20 0004 03/13/20 0359  BP: (!) 103/54 120/60 (!) 112/59 114/60  Pulse: 64 65 67 70  Resp: (!) 21 17 17 18   Temp: 98.3 F (36.8 C) 98.6 F (37 C) 98.6 F (37 C) (!) 97.4 F (36.3 C)  TempSrc: Oral Oral Oral Oral  SpO2: 100% 100% 97% 96%   CBC Latest Ref Rng & Units 03/13/2020 03/12/2020 03/11/2020  WBC 4.0 - 10.5 K/uL 3.9(L) 3.7(L) 4.4  Hemoglobin 13.0 - 17.0 g/dL 6.8(LL) 7.1(L) 7.7(L)  Hematocrit 39.0 - 52.0 % 21.6(L) 22.5(L) 23.1(L)  Platelets 150 - 400 K/uL 17(LL) 24(LL) 13(LL)   CMP Latest Ref Rng & Units 03/13/2020 03/12/2020 03/11/2020  Glucose 70 - 99 mg/dL 109(H) 115(H) 96  BUN 8 - 23 mg/dL 20 25(H) 28(H)  Creatinine 0.61 - 1.24 mg/dL 0.82 0.86 0.97  Sodium 135 - 145 mmol/L 133(L) 132(L) 132(L)  Potassium 3.5 - 5.1 mmol/L 3.7 3.7 4.5  Chloride 98 - 111 mmol/L 104 102 103  CO2 22 - 32 mmol/L 22 19(L) 19(L)  Calcium 8.9 - 10.3 mg/dL 8.4(L) 8.1(L) 8.3(L)  Total Protein 6.5 - 8.1 g/dL - - -  Total Bilirubin 0.3 - 1.2 mg/dL - - -  Alkaline Phos 38 - 126 U/L - - -  AST 15 - 41 U/L - - -  ALT 0 - 44 U/L - - -     Physical Exam  Physical Exam Constitutional:      General: He is not in acute distress.    Comments: Patient is able to verbalize his name but does not know time and location.  HENT:     Head: Normocephalic.  Eyes:     General:        Right eye: No discharge.         Left eye: No discharge.  Cardiovascular:     Rate and Rhythm: Normal rate and regular rhythm.  Pulmonary:     Effort: Pulmonary effort is normal. No respiratory distress.  Musculoskeletal:     Right lower leg: No edema.     Left lower leg: No edema.  Skin:    General: Skin is warm.  Neurological:     Mental Status: He is alert.  Psychiatric:        Mood and Affect: Mood normal.     Assessment/Plan: Shaun Hobbs a 87 y.o.with pertinent PMH of myelodysplastic syndrome, alzheimer's dementia, and hypothyroidismwho presented with altered mental status,found to have a left-sided subdural hematoma.His mental status is slowly improving.  Active Problems: Myelodysplasia (myelodysplastic syndrome) (HCC) Dementia without behavioral disturbance (HCC) Thrombocytopenia (HCC) Subdural hematoma (HCC)   Altered mental status-improving Left-sided subdural hematoma Patient continues to improve.  He is able to eat without difficulty. Will continue AEDand monitor his improvement. Family had a discussion with Shaun Hobbs, Shaun Hobbs admission, this morning.  Family would like patient to go to CIR before  going home.  However there is currently no bed available, patient will have to wait until next week for update.  In the meantime we will have social worker provide resources to family about caregiver and also nursing homes as an alternative plan. -Continue IV Keppra -Monitor mental status   Myelodysplastic syndrome Thrombocytopenia Heme/oncology was consulted and recommended keeping platelets above 20given his subdural hematoma. Hemoglobin drops to 6.8 and platelets 17 this morning, status post 1 unit of packed red blood cell and platelet transfusion. -Appreciate oncology recommendation -Continue Amicar solution -CBC in a.m. -Transfuse if platelets < 20 or hemoglobin < 7   Urinary retention Patient complaint of suprapubic pressure this morning.  Bladder scanned showed 1000 cc  and I/O catheter collected 1600 cc of urine.  Patient was on Flomax at home.  Could be a component of neurogenic bladder.  We will restart Flomax and check bladder scan every 8 hours.  In and out cath as needed.   Alzheimer -ContinueAricept    Hypothyroidism -Continue levothyroxine  Diet:Dysphagia 1 IVF:NA VTE:SCD CODE:DNR  Prior to Admission Living Arrangement:Home Anticipated Discharge Location:TBD Barriers to [place], DO 03/13/2020, 6:28 AM Pager: [phone] After 5pm on weekdays and 1pm on weekends: On Call pager [phone]

## 2020-03-13 NOTE — Progress Notes (Signed)
Manufacturing engineer [facility])  Hospital Liaison RN note         Notified by [facility] manager of patient/family request for [facility] Palliative services vs hospice services after discharge.               [facility] liaison team will reach out to family tomorrow to offer education about services and to be available to answer any questions family might have.      Thank you for the opportunity to participate in this patient's care.     Chrislyn Edison Pace, BSN, RN [place] (listed on [place] under Hospice[facility])    [phone]

## 2020-03-14 LAB — BPAM PLATELET PHERESIS
Blood Product Expiration Date: 202202052359
ISSUE DATE / TIME: 202202040758
Unit Type and Rh: 6200

## 2020-03-14 LAB — CBC
HCT: 23.8 % — ABNORMAL LOW (ref 39.0–52.0)
Hemoglobin: 7.6 g/dL — ABNORMAL LOW (ref 13.0–17.0)
MCH: 31.1 pg (ref 26.0–34.0)
MCHC: 31.9 g/dL (ref 30.0–36.0)
MCV: 97.5 fL (ref 80.0–100.0)
Platelets: 27 10*3/uL — CL (ref 150–400)
RBC: 2.44 MIL/uL — ABNORMAL LOW (ref 4.22–5.81)
RDW: 18.5 % — ABNORMAL HIGH (ref 11.5–15.5)
WBC: 3.3 10*3/uL — ABNORMAL LOW (ref 4.0–10.5)
nRBC: 0 % (ref 0.0–0.2)

## 2020-03-14 LAB — PREPARE PLATELET PHERESIS: Unit division: 0

## 2020-03-14 LAB — BASIC METABOLIC PANEL
Anion gap: 10 (ref 5–15)
BUN: 17 mg/dL (ref 8–23)
CO2: 21 mmol/L — ABNORMAL LOW (ref 22–32)
Calcium: 8.2 mg/dL — ABNORMAL LOW (ref 8.9–10.3)
Chloride: 102 mmol/L (ref 98–111)
Creatinine, Ser: 0.81 mg/dL (ref 0.61–1.24)
GFR, Estimated: >60 mL/min (ref >60)
Glucose, Bld: 106 mg/dL — ABNORMAL HIGH (ref 70–99)
Potassium: 3.7 mmol/L (ref 3.5–5.1)
Sodium: 133 mmol/L — ABNORMAL LOW (ref 135–145)

## 2020-03-14 MED ORDER — CHLORHEXIDINE GLUCONATE CLOTH 2 % EX PADS
6.0000 | MEDICATED_PAD | Freq: Every day | CUTANEOUS | Status: DC
  Administered 2020-03-14 – 2020-03-18 (×5): 6 via TOPICAL

## 2020-03-14 NOTE — Progress Notes (Signed)
Occupational Therapy Treatment Patient Details Name: Shaun Hobbs MRN: [id number] DOB: 1928-05-12 Today's Date: 03/14/2020    History of present illness Shaun Hobbs is a 87 y.o. with pertinent PMH of myelodysplastic syndrome, alzheimer's dementia, and hypothyroidism  who presented with altered mental status, found to have a left-sided subdural hematoma.   OT comments  Pt. Seen for skilled OT treatment session.  Pt. Able to perform simulated toilet transfer from eob to recliner with use of rw.  Heavy support to transition into standing then able to initiate steps once up on his feet.  Grooming tasks completed with min a and gestural cues.  Wife present and active in pts. Care.    Follow Up Recommendations  Home health OT;SNF;Supervision/Assistance - 24 hour    Equipment Recommendations  3 in 1 bedside commode    Recommendations for Other Services      Precautions / Restrictions Precautions Precautions: Fall Precaution Comments: incontinent of stool       Mobility Bed Mobility Overal bed mobility: Needs Assistance Bed Mobility: Rolling;Sidelying to Sit Rolling: Min assist Sidelying to sit: Min assist       General bed mobility comments: use of rails  Transfers Overall transfer level: Needs assistance Equipment used: Rolling walker (2 wheeled) Transfers: Sit to/from [facility] Sit to Stand: Mod assist Stand pivot transfers: Min assist       General transfer comment: heavy physical assistance for initial portion of sit/stand but once up level of assistance decreases, cues for pivot. able to follow instuctions to reach for arm rests before sitting down    Balance                                           ADL either performed or assessed with clinical judgement   ADL Overall ADL's : Needs assistance/impaired     Grooming: Oral care;Brushing hair;Minimal assistance;Sitting;Cueing for sequencing Grooming Details (indicate cue  type and reason): able to initiate tasks with visual demo-not responding verbally but follows gestural cues                 Toilet Transfer: Moderate assistance;RW Toilet Transfer Details (indicate cue type and reason): simulated from eob to recliner transfer         Functional mobility during ADLs: Minimal assistance General ADL Comments: able to initiate tasks with gestural cues     Vision       Perception     Praxis      Cognition Arousal/Alertness: Lethargic Behavior During Therapy: Flat affect Overall Cognitive Status: Impaired/Different from baseline Reviewed with pts. Wife to minimize distractions (conversation and tv) when having pt. Perform tasks. Also reviewed less explanation and review, more demonstration of familiar tasks and pt. Able to engage easier.                                           Exercises     Shoulder Instructions       General Comments      Pertinent Vitals/ Pain       Faces Pain Scale: No hurt  Home Living  Prior Functioning/Environment              Frequency  Min 2X/week        Progress Toward Goals  OT Goals(current goals can now be found in the care plan section)  Progress towards OT goals: Progressing toward goals     Plan      Co-evaluation                 AM-PAC OT "6 Clicks" Daily Activity     Outcome Measure   Help from another person eating meals?: A Little Help from another person taking care of personal grooming?: A Little Help from another person toileting, which includes using toliet, bedpan, or urinal?: A Lot Help from another person bathing (including washing, rinsing, drying)?: A Lot Help from another person to put on and taking off regular upper body clothing?: A Little Help from another person to put on and taking off regular lower body clothing?: A Little 6 Click Score: 16    End of Session Equipment  Utilized During Treatment: Gait belt;Rolling walker  OT Visit Diagnosis: Unsteadiness on feet (R26.81);Other abnormalities of gait and mobility (R26.89);Muscle weakness (generalized) (M62.81);History of falling (Z91.81)   Activity Tolerance Patient tolerated treatment well   Patient Left in chair;with call bell/phone within reach;with chair alarm set   Nurse Communication          Time: 8315-1761 OT Time Calculation (min): 30 min  Charges: OT General Charges $OT Visit: 1 Visit OT Treatments $Self Care/Home Management : 23-37 mins  Sonia Baller, COTA/L Acute Rehabilitation [phone]   Janice Coffin 03/14/2020, 11:55 AM

## 2020-03-14 NOTE — Plan of Care (Signed)

## 2020-03-14 NOTE — Progress Notes (Signed)
In and out catheterized patientat after bladder scan showed over 1054mL urine retaining in bladder. Drained 1065 mL, and 26 mL was left in bladder post I&O.  In and Out completed at 0230.

## 2020-03-14 NOTE — Progress Notes (Signed)
Subjective: HD 6 Overnight, patient noted to have ongoing urinary retention requiring I&O with >1L urine output.   This morning, patient is resting comfortably in bed. He is awake and alert. He is oriented to self and date of birth and is able to recognize his wife. He denies any pain at this time.   Objective:  Vital signs in last 24 hours: Vitals:   03/14/20 0447 03/14/20 0533 03/14/20 0652 03/14/20 0729  BP: (!) 94/52 (!) 113/47 (!) 123/57 (!) 100/50  Pulse:   67 63  Resp: 11 13 12 16   Temp:    98.6 F (37 C)  TempSrc:    Oral  SpO2:   96% 96%   CBC Latest Ref Rng & Units 03/14/2020 03/13/2020 03/13/2020  WBC 4.0 - 10.5 K/uL 3.3(L) - 3.9(L)  Hemoglobin 13.0 - 17.0 g/dL 7.6(L) 7.7(L) 6.8(LL)  Hematocrit 39.0 - 52.0 % 23.8(L) 22.6(L) 21.6(L)  Platelets 150 - 400 K/uL 27(LL) - 17(LL)   BMP Latest Ref Rng & Units 03/14/2020 03/13/2020 03/12/2020  Glucose 70 - 99 mg/dL 106(H) 109(H) 115(H)  BUN 8 - 23 mg/dL 17 20 25(H)  Creatinine 0.61 - 1.24 mg/dL 0.81 0.82 0.86  Sodium 135 - 145 mmol/L 133(L) 133(L) 132(L)  Potassium 3.5 - 5.1 mmol/L 3.7 3.7 3.7  Chloride 98 - 111 mmol/L 102 104 102  CO2 22 - 32 mmol/L 21(L) 22 19(L)  Calcium 8.9 - 10.3 mg/dL 8.2(L) 8.4(L) 8.1(L)   Physical Exam  Constitutional: Chronically ill appearing elderly male; No distress.  Cardiovascular: Normal rate, regular rhythm, S1 and S2 present, no murmurs, rubs, gallops.  Distal pulses intact Respiratory: No respiratory distress, no accessory muscle use.  Effort is normal.  Lungs are clear to auscultation bilaterally. GI: Nondistended, soft, nontender to palpation, active bowel sounds Musculoskeletal: Normal bulk and tone.  No peripheral edema noted. Skin: Warm and dry.  No rash, erythema, lesions noted.  Assessment/Plan: Mr Kaulder Zahner is an 87 year old male with PMHx of advanced Alzheimer's dementia, hypothyroidism and myelodysplastic syndrome presenting with altered mental status in setting of spontaneous  subdural hematoma.   Principal Problem:   Subdural hematoma (HCC) Active Problems:   Myelodysplasia (myelodysplastic syndrome) (HCC)   Dementia without behavioral disturbance (HCC)   Thrombocytopenia (HCC)   Palliative care by specialist   Goals of care, counseling/discussion   Encounter for hospice care discussion   DNI (do not intubate)  AMS 2/2 Spontaneous Subdural Hematoma: Patient's mental status, though waxing and waning, continues to improve.  He is able to identify self, wife, birthdate.  Patient is now oriented to location or situation.  Per wife at bedside, patient is almost back to baseline. Ongoing discussions with CIR, social work, palliative care regarding disposition.  -P.o. Keppra 750 mg twice daily -Continue to monitor mental status -Continue PT/OT  Thrombocytopenia 2/2 Myelodysplastic syndrome  Patient with history of myelodysplastic syndrome followed by Dr. Irene Limbo.  Patient with baseline platelet count less than 5 as outpatient.  In setting of his subdural hematoma, recommendation to keep platelets greater than 20.  Patient has required platelet transfusions every other day during hospitalization to keep at goal.  He has also required blood transfusions secondary to his anemia and in setting of myelodysplastic syndrome.  Hemoglobin stable this morning following 1 unit PRBC yesterday.  -Continue to trend CBC -Transfuse for goal Plt>20 and Hb>7  Urinary Retention in setting of BPH  Patient with history of BPH on Flomax at home.  Has had 2 bladder  scans requiring I&O's with greater than 1000 cc of urine output.  Given risk of bladder stretch injury, patient advised for Foley. -Insert Foley catheter -Monitor urinary output -Continue Flomax  Alzheimer's dementia:  Continue Aricept  Hypothyroidism:  Continue Synthroid  Code Status: DNR/DNI Diet: Dysphagia I IV Fluids: None DVT Prophylaxis: SCD's  Prior to Admission Living Arrangement: Home Anticipated Discharge  Location: TBD Barriers to Discharge: Placement  Dispo: Anticipated discharge in approximately 3-4 day(s).   Harvie Heck, MD  [facility] PGY-2 03/14/2020, 2:27 PM Pager: [phone] After 5pm on weekdays and 1pm on weekends: On Call pager [phone]

## 2020-03-15 DIAGNOSIS — G309 Alzheimer's disease, unspecified: Secondary | ICD-10-CM | POA: Diagnosis not present

## 2020-03-15 DIAGNOSIS — S065X9A Traumatic subdural hemorrhage with loss of consciousness of unspecified duration, initial encounter: Secondary | ICD-10-CM | POA: Diagnosis not present

## 2020-03-15 DIAGNOSIS — D469 Myelodysplastic syndrome, unspecified: Secondary | ICD-10-CM | POA: Diagnosis not present

## 2020-03-15 DIAGNOSIS — D696 Thrombocytopenia, unspecified: Secondary | ICD-10-CM | POA: Diagnosis not present

## 2020-03-15 LAB — BASIC METABOLIC PANEL
Anion gap: 10 (ref 5–15)
BUN: 15 mg/dL (ref 8–23)
CO2: 21 mmol/L — ABNORMAL LOW (ref 22–32)
Calcium: 8.2 mg/dL — ABNORMAL LOW (ref 8.9–10.3)
Chloride: 101 mmol/L (ref 98–111)
Creatinine, Ser: 0.82 mg/dL (ref 0.61–1.24)
GFR, Estimated: >60 mL/min (ref >60)
Glucose, Bld: 103 mg/dL — ABNORMAL HIGH (ref 70–99)
Potassium: 3.7 mmol/L (ref 3.5–5.1)
Sodium: 132 mmol/L — ABNORMAL LOW (ref 135–145)

## 2020-03-15 LAB — CBC
HCT: 22.5 % — ABNORMAL LOW (ref 39.0–52.0)
Hemoglobin: 7.5 g/dL — ABNORMAL LOW (ref 13.0–17.0)
MCH: 31.9 pg (ref 26.0–34.0)
MCHC: 33.3 g/dL (ref 30.0–36.0)
MCV: 95.7 fL (ref 80.0–100.0)
Platelets: 21 10*3/uL — CL (ref 150–400)
RBC: 2.35 MIL/uL — ABNORMAL LOW (ref 4.22–5.81)
RDW: 18.4 % — ABNORMAL HIGH (ref 11.5–15.5)
WBC: 3.5 10*3/uL — ABNORMAL LOW (ref 4.0–10.5)
nRBC: 0 % (ref 0.0–0.2)

## 2020-03-15 NOTE — Progress Notes (Signed)
Subjective: HD 7 Overnight, no acute events reported.   This morning, patient evaluated at bedside.  He is sitting up in bed and eating breakfast.  Daughter, Shaun Hobbs, at bedside.  No acute concerns at this time.  Daughter is hopeful that patient can do inpatient rehab and continue family discussions regarding patient's care following rehab.  Objective:  Vital signs in last 24 hours: Vitals:   03/14/20 0729 03/14/20 2009 03/14/20 2317 03/15/20 0343  BP: (!) 100/50 (!) 142/65 136/60   Pulse: 63 63 65   Resp: 16 14 19    Temp: 98.6 F (37 C) 98.4 F (36.9 C) 97.7 F (36.5 C) 97.7 F (36.5 C)  TempSrc: Oral Oral Axillary Oral  SpO2: 96% 100% 99%    CBC Latest Ref Rng & Units 03/15/2020 03/14/2020 03/13/2020  WBC 4.0 - 10.5 K/uL 3.5(L) 3.3(L) -  Hemoglobin 13.0 - 17.0 g/dL 7.5(L) 7.6(L) 7.7(L)  Hematocrit 39.0 - 52.0 % 22.5(L) 23.8(L) 22.6(L)  Platelets 150 - 400 K/uL 21(LL) 27(LL) -   BMP Latest Ref Rng & Units 03/15/2020 03/14/2020 03/13/2020  Glucose 70 - 99 mg/dL 103(H) 106(H) 109(H)  BUN 8 - 23 mg/dL 15 17 20   Creatinine 0.61 - 1.24 mg/dL 0.82 0.81 0.82  Sodium 135 - 145 mmol/L 132(L) 133(L) 133(L)  Potassium 3.5 - 5.1 mmol/L 3.7 3.7 3.7  Chloride 98 - 111 mmol/L 101 102 104  CO2 22 - 32 mmol/L 21(L) 21(L) 22  Calcium 8.9 - 10.3 mg/dL 8.2(L) 8.2(L) 8.4(L)   Physical Exam  Constitutional: Chronically ill appearing elderly male; No distress.  Cardiovascular: Normal rate, regular rhythm, S1 and S2 present, no murmurs, rubs, gallops.  Distal pulses intact Respiratory: No respiratory distress, no accessory muscle use.  Effort is normal.  Lungs are clear to auscultation bilaterally. GI: Nondistended, soft, nontender to palpation, active bowel sounds Musculoskeletal: Normal bulk and tone.  No peripheral edema noted. Skin: Warm and dry.  No rash, erythema, lesions noted.  Assessment/Plan: [name] is an 87 year old male with PMHx of advanced Alzheimer's dementia, hypothyroidism and  myelodysplastic syndrome presenting with altered mental status in setting of spontaneous subdural hematoma.   Principal Problem:   Subdural hematoma (HCC) Active Problems:   Myelodysplasia (myelodysplastic syndrome) (HCC)   Dementia without behavioral disturbance (HCC)   Thrombocytopenia (HCC)   Palliative care by specialist   Goals of care, counseling/discussion   Encounter for hospice care discussion   DNI (do not intubate)  AMS 2/2 Spontaneous Subdural Hematoma: Patient's mental status, though waxing and waning, continues to improve.  According to daughter at bedside, patient is at currently at baseline status. Ongoing discussions with CIR, social work, palliative care regarding disposition.  -P.o. Keppra 750 mg twice daily -Continue to monitor mental status -Continue PT/OT  Thrombocytopenia 2/2 Myelodysplastic syndrome  Patient with history of myelodysplastic syndrome followed by Dr. Irene Limbo.  Patient with baseline platelet count less than 5 as outpatient.  In setting of his subdural hematoma, recommendation to keep platelets >20.  Hemoglobin and platelets are stable today. -Continue to trend CBC -Transfuse for goal Plt>20 and Hb>7  Urinary Retention in setting of BPH  Patient currently has Foley in setting of urinary retention.  1.5 L urine output over past 24 hours. -Monitor urinary output -Continue Flomax  Alzheimer's dementia:  Continue Aricept  Hypothyroidism:  Continue Synthroid  Code Status: DNR/DNI Diet: Dysphagia I IV Fluids: None DVT Prophylaxis: SCD's  Prior to Admission Living Arrangement: Home Anticipated Discharge Location: TBD Barriers to  Discharge: Placement  Dispo: Anticipated discharge in approximately 3-4 day(s).   Harvie Heck, MD  [facility] PGY-2 03/15/2020, 6:08 AM Pager: [phone] After 5pm on weekdays and 1pm on weekends: On Call pager [phone]

## 2020-03-15 NOTE — Progress Notes (Signed)
Daily Progress Note   Patient Name: Shaun Hobbs       Date: 03/15/2020 DOB: August 29, 1928  Age: 87 y.o. MRN#: [id number] Attending Physician: Axel Filler, * Primary Care Physician: Chesley Noon, MD Admit Date: 03/08/2020  Reason for Follow-up: continued GOC discussion  Subjective: Patient is alert and resting comfortably in bed, states he feels "good".   Wife [name] at bedside. Discussed that disposition plan remains CIR, although unfortunately there were no beds available this weekend and limited beds next week. Family is also looking at inpatient rehab in [facility].   Again discuss recommendation for outpatient palliative to follow him after discharge from rehab. [name] agrees and indicates choice of [facility]. I have notified their liaison of the referral.   Length of Stay: 7   Physical Exam Constitutional:      General: He is not in acute distress. Pulmonary:     Effort: Pulmonary effort is normal.  Neurological:     Mental Status: He is alert.     Motor: Weakness present.     Comments: Oriented to self             Vital Signs: BP 112/60 (BP Location: Right Arm)   Pulse 64   Temp 98.3 F (36.8 C) (Oral)   Resp 15   SpO2 97%  SpO2: SpO2: 97 % O2 Device: O2 Device: Room Air O2 Flow Rate:         Palliative Assessment/Data: PPS 50%     Palliative Care Assessment & Plan   Patient Profile: 87 y.o.malewith past medical history of transfusion dependent myelodysplastic syndrome, Alzheimer's dementia, and hypothyroidism presented to the emergency departmenton 1/30/2022with altered mental status.His wife reports decreased cognition over the past 3-4 days, decreased speech, and inability to perform ADLs. Per family report this is a sudden and  drastic change from baseline.  ED Course:Platelets <5. CT scan revealed subdural hematoma. Hematology recommended 2 units of platelets. Neurosurgery does not recommend surgical intervention. Patient admitted to Internal Medicine TS for further management.  Assessment: Altered mental status Left-sided subdural hematoma Myelodysplastic syndrome Alzheimer's dementia Urinary retention  Recommendations/Plan:  DNR/DNI status as previosly documented  Family is hopeful for continued improvement and has decided CIR is the best option   Ultimate goal of care is for patient to return home  Family is open to hospice care at some point in the future, in the event of further decline  Referral made for outpatient palliative after discharge from CIR - family prefers [facility] - I have notified their liaison  No additional PMT needs at this time, please call [phone] if we can be of additional assistance or need to actively re-engage with this patient and family.  Goals of Care and Additional Recommendations:  Limitations on Scope of Treatment: Full Scope Treatment  Code Status: DNR/DNI  Prognosis:   Unable to determine  Discharge Planning:  Inpatient rehab, then home with outpatient palliative   Thank you for allowing the Palliative Medicine Team to assist in the care of this patient.   Total Time 15 minutes Prolonged Time Billed  no       Greater than 50%  of this time was spent counseling and coordinating care related to the above assessment and plan.  Lavena Bullion, NP  Please contact Palliative Medicine Team phone at [phone] for questions and concerns.

## 2020-03-15 NOTE — Progress Notes (Signed)
[facility] ([facility])  Hospital Liaison: RN note       Notified by TOC manager of patient/family request for [facility] Palliative services at home after discharge.               [facility] Palliative team will follow up with patient after discharge.       Please call with any hospice or palliative related questions.       Thank you for this referral.       Melissa O'Bryant, BSN, RN [facility] Hospital Liaison (listed on [facility] under [facility] [place])   [phone]    

## 2020-03-16 LAB — CBC
HCT: 23.7 % — ABNORMAL LOW (ref 39.0–52.0)
Hemoglobin: 8 g/dL — ABNORMAL LOW (ref 13.0–17.0)
MCH: 32.7 pg (ref 26.0–34.0)
MCHC: 33.8 g/dL (ref 30.0–36.0)
MCV: 96.7 fL (ref 80.0–100.0)
Platelets: 17 10*3/uL — CL (ref 150–400)
RBC: 2.45 MIL/uL — ABNORMAL LOW (ref 4.22–5.81)
RDW: 17.9 % — ABNORMAL HIGH (ref 11.5–15.5)
WBC: 3.2 10*3/uL — ABNORMAL LOW (ref 4.0–10.5)
nRBC: 0 % (ref 0.0–0.2)

## 2020-03-16 LAB — SARS CORONAVIRUS 2 BY RT PCR (HOSPITAL ORDER, PERFORMED IN ~~LOC~~ HOSPITAL LAB): SARS Coronavirus 2: NEGATIVE

## 2020-03-16 MED ORDER — AMINOCAPROIC ACID 0.25 GM/ML PO SOLN: 1250.0000 mg | Freq: Three times a day (TID) | ORAL | Status: AC

## 2020-03-16 MED ORDER — SODIUM CHLORIDE 0.9% IV SOLUTION: Freq: Once | INTRAVENOUS | Status: AC

## 2020-03-16 MED ORDER — LEVETIRACETAM 750 MG PO TABS: 750.0000 mg | ORAL_TABLET | Freq: Two times a day (BID) | ORAL | Status: DC

## 2020-03-16 NOTE — Plan of Care (Signed)

## 2020-03-16 NOTE — Progress Notes (Signed)
Subjective:   Hospital day: 8  Overnight event: no acute event  [name] was evaluated at bedside this morning. He is resting comfortably in bed. Patient's daughter, Shaun Hobbs, is at bedside. She notes that patient has been more somnolent over the past two days. She does note that he has not had as much intake over the past 24 hours.   Objective:  Vital signs in last 24 hours: Vitals:   03/15/20 2000 03/16/20 0001 03/16/20 0700 03/16/20 1357  BP:  114/63 104/62 (!) 113/53  Pulse: 64 63  63  Resp:  20 18 15   Temp: 98.3 F (36.8 C) 98.8 F (37.1 C) 98.4 F (36.9 C) 97.6 F (36.4 C)  TempSrc: Oral Oral Oral Axillary  SpO2:  96% 98% 100%   CBC Latest Ref Rng & Units 03/15/2020 03/14/2020 03/13/2020  WBC 4.0 - 10.5 K/uL 3.5(L) 3.3(L) -  Hemoglobin 13.0 - 17.0 g/dL 7.5(L) 7.6(L) 7.7(L)  Hematocrit 39.0 - 52.0 % 22.5(L) 23.8(L) 22.6(L)  Platelets 150 - 400 K/uL 21(LL) 27(LL) -   CMP Latest Ref Rng & Units 03/15/2020 03/14/2020 03/13/2020  Glucose 70 - 99 mg/dL 103(H) 106(H) 109(H)  BUN 8 - 23 mg/dL 15 17 20   Creatinine 0.61 - 1.24 mg/dL 0.82 0.81 0.82  Sodium 135 - 145 mmol/L 132(L) 133(L) 133(L)  Potassium 3.5 - 5.1 mmol/L 3.7 3.7 3.7  Chloride 98 - 111 mmol/L 101 102 104  CO2 22 - 32 mmol/L 21(L) 21(L) 22  Calcium 8.9 - 10.3 mg/dL 8.2(L) 8.2(L) 8.4(L)  Total Protein 6.5 - 8.1 g/dL - - -  Total Bilirubin 0.3 - 1.2 mg/dL - - -  Alkaline Phos 38 - 126 U/L - - -  AST 15 - 41 U/L - - -  ALT 0 - 44 U/L - - -     Physical Exam  Physical Exam Constitutional:      General: He is not in acute distress. HENT:     Head: Normocephalic.  Eyes:     General:        Right eye: No discharge.        Left eye: No discharge.  Cardiovascular:     Rate and Rhythm: Normal rate and regular rhythm.  Pulmonary:     Effort: Pulmonary effort is normal.  Musculoskeletal:     Right lower leg: No edema.     Left lower leg: No edema.  Skin:    General: Skin is warm.  Neurological:     Mental  Status: He is alert.  Psychiatric:        Mood and Affect: Mood normal.     Assessment/Plan: [name] is an 87 year old male with PMHx of advanced Alzheimer's dementia, hypothyroidism and myelodysplastic syndrome presenting with altered mental status in setting of spontaneous subdural hematoma. Pending CIR placement.    Principal Problem:   Subdural hematoma (HCC) Active Problems:   Myelodysplasia (myelodysplastic syndrome) (HCC)   Dementia without behavioral disturbance (HCC)   Thrombocytopenia (HCC)   Palliative care by specialist   Goals of care, counseling/discussion   Encounter for hospice care discussion   DNI (do not intubate)  AMS 2/2 Spontaneous Subdural Hematoma: Patient's mental status improving but still not at baseline per daughter.  Patient will be transferred to [facility] for physical therapy.  Then patient will be going home with home health PT and outpatient palliative. -P.o. Keppra 750 mg twice daily -Continue to monitor mental status -Continue PT/OT  Thrombocytopenia  2/2 Myelodysplastic syndrome  Patient with history of myelodysplastic syndrome followed by Dr. Irene Limbo.  Patient with baseline platelet count less than 5 as outpatient.  In setting of his subdural hematoma, recommendation to keep platelets >20.    -Transfuse 1 unit of platelets today and check CBC posttransfusion. -Continue to trend CBC -Transfuse for goal Plt>20 and Hb>7  Urinary Retention in setting of BPH  Patient currently has Foley in setting of urinary retention and possible bladder stretch injury -Monitor urinary output -Continue Flomax  Alzheimer's dementia:  Continue Aricept  Hypothyroidism:  Continue Synthroid  Code Status: DNR/DNI Diet: Dysphagia I IV Fluids: None DVT Prophylaxis: SCD's  Prior to Admission Living Arrangement: Home Anticipated Discharge Location: CIR Barriers to Discharge: Platelet transfusion Dispo: Anticipated discharge tomorrow  Gaylan Gerold, DO 03/16/2020, 2:51 PM Pager: [phone] After 5pm on weekdays and 1pm on weekends: On Call pager [phone]

## 2020-03-16 NOTE — Progress Notes (Signed)
Pt stable for discharge per MD order. Iv and tele removed. Discharge information reviewed with patient, wife and daughter. All questions answered. Report called to RN at [facility], and given to [facility] transport. Patient going to room C414.

## 2020-03-16 NOTE — Progress Notes (Signed)
  Speech Language Pathology Treatment: Dysphagia  Patient Details Name: Shaun Hobbs MRN: [id number] DOB: 10-08-1928 Today's Date: 03/16/2020 Time: 1345-1400 SLP Time Calculation (min) (ACUTE ONLY): 15 min  Assessment / Plan / Recommendation Clinical Impression  Pt was able to feed himself applesauce today with pillow supporting elbow of RUE - he demonstrated adequate initiation, then required intermittent verbal cues to persist through task.  Oral manipulation was adequate with no residue post-swallow. There were no s/s of aspiration when drinking water from a straw, which pt was able to manage with min physical support.  His family had questions about the potential to advance diet from pureed, and we decided since he was leaving today for rehab, the SLP at [facility] could evaluate and make that decision. He is safe on dysphagia 1/thins and should be encouraged to self-feed when able. (He was eating a regular diet and feeding himself PTA.)   HPI HPI: 87 y/o M with a PMHx of myelodysplastic syndrome, alzheimer's dementia, and hypothyroidism presented to the ED today with his wife for decreased cognition over the past 3-4 days that acutely worsened over the past day. His wife noted decreased speech,conversation and ability to perform his ADL's over the past week. CT of head noted with Small subdural hematoma along the LEFT tentorium, extending onto LEFT falx and anteriorly into the medial aspect of the LEFT middle cranial fossa as well as extending into the LEFT occipital region, measuring up to 4 mm thick. Additional small amount of subdural blood overlying the LEFT occipital lobe posteriorly.      SLP Plan  All goals met       Recommendations  Diet recommendations: Dysphagia 1 (puree);Thin liquid Liquids provided via: Cup;Straw Medication Administration: Crushed with puree Supervision: Staff to assist with self feeding;Trained caregiver to feed patient;Full supervision/cueing for compensatory  strategies Compensations: Minimize environmental distractions;Slow rate;Small sips/bites;Lingual sweep for clearance of pocketing Postural Changes and/or Swallow Maneuvers: Seated upright 90 degrees                Oral Care Recommendations: Oral care BID SLP Visit Diagnosis: Dysphagia, unspecified (R13.10) Plan: All goals met       GO                [name] 03/16/2020, 2:05 PM  Brielynn Sekula L. Tivis Ringer, [place] Office number [phone] Pager [phone]

## 2020-03-16 NOTE — TOC Transition Note (Signed)
Transition of Care [facility]) - CM/SW Discharge Note   Patient Details  Name: Shaun Hobbs MRN: [id number] Date of Birth: 1928/04/04  Transition of Care [facility]) CM/SW Contact:  Pollie Friar, RN Phone Number: 03/16/2020, 1:23 PM   Clinical Narrative:    Pt discharging to [facility] today. They offered a bed and the daughter/ wife accepted.  Pt transporting via [facility]. Wife and daughter at the bedside and aware of transport to rehab today.  Bedside RN updated and d/c packet at the desk.  Room: C414 Number for report: [phone]   Final next level of care: IP Rehab Facility Barriers to Discharge: No Barriers Identified   Patient Goals and CMS Choice Patient states their goals for this hospitalization and ongoing recovery are:: Unable to assess patient onlt oriented to self. CMS Medicare.gov Compare Post Acute Care list provided to:: Patient Represenative (must comment) Choice offered to / list presented to : Adult Children,Spouse  Discharge Placement                       Discharge Plan and Services     Post Acute Care Choice: IP Rehab                               Social Determinants of Health (SDOH) Interventions     Readmission Risk Interventions No flowsheet data found.

## 2020-03-16 NOTE — Progress Notes (Signed)
Inpatient Rehabilitation Admissions Coordinator   I met with patient, daughter, [name] and patient wife at bedside. We do not have a CIR bed for admit for him today. They have accepted AIR bed at [facility]. We will sign off at this time.  Danne Baxter, RN, MSN Rehab Admissions Coordinator [phone] 03/16/2020 11:38 AM

## 2020-03-16 NOTE — Discharge Summary (Incomplete Revision)
Name: Shaun Hobbs MRN: [id number] DOB: 13-Oct-1928 87 y.o. PCP: Chesley Noon, MD  Date of Admission: 03/08/2020  9:04 AM Date of Discharge:  03/16/2020 Attending Physician: Aldine Contes, MD  Discharge Diagnosis: 1.  Subdural hematoma 2.  Myelodysplastic syndrome 3.  Alzheimer's disease 4.  Acute urinary retention 5. Arrhythmia   Discharge Medications: Allergies as of 03/16/2020      Reactions   Sulfa Antibiotics Other (See Comments)   Patient reports: reaction in past       Medication List    STOP taking these medications   mupirocin ointment 2 % Commonly known as: Bactroban   tranexamic acid 650 MG Tabs tablet Commonly known as: LYSTEDA     TAKE these medications   aminocaproic acid 25 % solution Commonly known as: AMICAR Take 5 mLs (1,250 mg total) by mouth 3 (three) times daily.   donepezil 10 MG tablet Commonly known as: ARICEPT Take 10 mg by mouth daily.   levETIRAcetam 750 MG tablet Commonly known as: KEPPRA Take 1 tablet (750 mg total) by mouth 2 (two) times daily.   levothyroxine 100 MCG tablet Commonly known as: SYNTHROID Take 100 mcg by mouth daily before breakfast.   tamsulosin 0.4 MG Caps capsule Commonly known as: FLOMAX Take 0.4 mg by mouth at bedtime.   triamcinolone lotion 0.1 % Commonly known as: KENALOG Apply 1 application topically 3 (three) times daily.   VITAMIN D3 PO Take 1,000 Units by mouth daily.      HPI [name] was evaluated at bedside this morning. He is resting comfortably in bed. Patient's daughter, [name], is at bedside. She notes that patient has been more somnolent over the past two days. She does note that he has not had as much intake over the past 24 hours. She and the patient do not have any questions this morning. ***  Disposition and follow-up:   Mr.Shaun Hobbs was discharged from [facility] in Stable condition.  At the hospital follow up visit please address:  1.    Subdural Hematoma CT head showed small left subdural hematoma.  No surgical intervention per neurosurgery given age and thrombocytopenia.  Started on [place] for epileptogenic city activity on EEG -Follow-up with PCP or neurosurgery if appropriate -Monitor mental status  Myelodysplastic syndrome Dr. Irene Limbo oncology has seen the patient.  Recommended transfuse if Platelet < 20 and Hgb < 7.  -Can check CBC once or twice weekly -Follow-up with Dr. Irene Limbo  Acute urinary retention due to BPH His Flomax was held given altered mental status.  Foley catheter was placed for urinary retention and possible bladder stretch injury. -Continue Flomax.  Voiding trial if appropriate -Follow up with Urology  2.  Labs / imaging needed at time of follow-up: CBC, BMP  3.  Pending labs/ test needing follow-up: NA  Follow-up Appointments:  Follow-up Information    Chesley Noon, MD Follow up.   Specialty: Family Medicine Contact information: [place] [address] [phone]        Brunetta Genera, MD. Schedule an appointment as soon as possible for a visit.   Specialties: Hematology, Oncology Contact information: [place] [address] [phone]        [place]. Schedule an appointment as soon as possible for a visit in 3 week(s).   Contact information: [place] [phone]  Hospital Course by problem list: 1.  Subdural hematoma Patient presented to the hospital for acute encephalopathy in the past 3-4 days.  CT head showed small left subdural hematoma.  This is likely due to spontaneous bleeding from thrombocytopenia.  No significant trauma associated.  No surgical interventions per neurosurgery.  EEG showed epileptogenic city of the left hemisphere likely due to the subdural hematoma.  Patient was started on Keppra and continued on EEG monitor.  His mental  status slowly improved in the next few days with improvement in orientation and speaking ability.  Palliative was consulted and discussed with family regarding goals of care.  Family would like patient to go to inpatient rehab for physical therapy and then go home with home health PT and outpatient palliative.  2.  Myelodysplastic syndrome His platelets on admission was less than 5 and he received 5 units of platelets and 1 unit of blood since admission.  Per Dr. Irene Limbo, goal platelets > 20 and hemoglobin > 7 due to his subdural hematoma.  Patient will continue follow-up with Dr.Kale after discharge.  3.  Acute urinary retention Patient developed acute urinary retention in the setting of BPH.  His Flomax was held on admission given his mental status.  Foley catheter was placed due to multiple bladder scans and suspected bladder stretch injury.  His Flomax was restarted.  Can attempt voiding trial if appropriate. Follow up with Urology after discharge.   4.  Tachycardia/arrhythmia On day 1, patient developed a brief episode of tachycardia with heart rate in the 130s with EKG showing irregularly irregular rhythm possible for MAT vs aflutter.  Patient self converted back to sinus shortly after.  Patient was asymptomatic at that time.  No more episode of tachycardia until discharge.  Discharge Exam:   BP 104/62 (BP Location: Right Arm)   Pulse 63   Temp 98.4 F (36.9 C) (Oral)   Resp 18   SpO2 98%  Discharge exam:   Physical Exam Constitutional:      General: He is not in acute distress. HENT:     Head: Normocephalic.  Eyes:     General:        Right eye: No discharge.        Left eye: No discharge.  Cardiovascular:     Rate and Rhythm: Normal rate and regular rhythm.  Pulmonary:     Effort: Pulmonary effort is normal. No respiratory distress.  Musculoskeletal:     Right lower leg: No edema.     Left lower leg: No edema.  Neurological:     Mental Status: He is alert.  Psychiatric:         Mood and Affect: Mood normal.     Pertinent Labs, Studies, and Procedures:  CBC Latest Ref Rng & Units 03/15/2020 03/14/2020 03/13/2020  WBC 4.0 - 10.5 K/uL 3.5(L) 3.3(L) -  Hemoglobin 13.0 - 17.0 g/dL 7.5(L) 7.6(L) 7.7(L)  Hematocrit 39.0 - 52.0 % 22.5(L) 23.8(L) 22.6(L)  Platelets 150 - 400 K/uL 21(LL) 27(LL) -   CMP Latest Ref Rng & Units 03/15/2020 03/14/2020 03/13/2020  Glucose 70 - 99 mg/dL 103(H) 106(H) 109(H)  BUN 8 - 23 mg/dL 15 17 20   Creatinine 0.61 - 1.24 mg/dL 0.82 0.81 0.82  Sodium 135 - 145 mmol/L 132(L) 133(L) 133(L)  Potassium 3.5 - 5.1 mmol/L 3.7 3.7 3.7  Chloride 98 - 111 mmol/L 101 102 104  CO2 22 - 32 mmol/L 21(L) 21(L) 22  Calcium 8.9 - 10.3 mg/dL 8.2(L) 8.2(L) 8.4(L)  Total Protein 6.5 - 8.1 g/dL - - -  Total Bilirubin 0.3 - 1.2 mg/dL - - -  Alkaline Phos 38 - 126 U/L - - -  AST 15 - 41 U/L - - -  ALT 0 - 44 U/L - - -   CT Head Wo Contrast  Result Date: 03/08/2020 IMPRESSION: Small subdural hematoma along the LEFT tentorium, extending onto LEFT falx and anteriorly into the medial aspect of the LEFT middle cranial fossa as well as extending into the LEFT occipital region, measuring up to 4 mm thick. Additional small amount of subdural blood overlying the LEFT occipital lobe posteriorly. No significant mass effect or midline shift. Atrophy with small vessel chronic ischemic changes of deep cerebral white matter. No acute intraparenchymal abnormalities identified. Critical Value/emergent results were called by telephone at the time of interpretation on 03/08/2020 at 11:08 am to provider DR. ANKIT NANAVATI , who verbally acknowledged these results. Electronically Signed   By: Lavonia Dana M.D.   On: 03/08/2020 11:08   DG Chest Portable 1 View  Result Date: 03/08/2020 CLINICAL DATA:  Altered mental status, fever, history dementia EXAM: PORTABLE CHEST 1 VIEW COMPARISON:  Portable exam 0923 hours compared to 12/12/2018 FINDINGS: Normal heart size, mediastinal contours, and  pulmonary vascularity. Atherosclerotic calcification aorta. Lungs clear. No acute infiltrate, pleural effusion, or pneumothorax. Bones demineralized. IMPRESSION: No acute abnormalities. Aortic Atherosclerosis (ICD10-I70.0). Electronically Signed   By: Lavonia Dana M.D.   On: 03/08/2020 09:33   EEG adult  IMPRESSION: This study showed evidence of epileptogenicity arising from left hemisphere due to underlying structural abnormality. Additionally there is moderate diffuse encephalopathy, nonspecific etiology. No seizures were seen throughout the recording. Primary team was notified. Priyanka O Yadav   Overnight EEG with video  IMPRESSION: This study initially showed evidence of epileptogenicity arising from left hemisphere due to underlying structural abnormality. Gradually EEG showed generalized periodic discharges with triphasic morphology when patient was stimulated which is on the ictal-interictal continuum.  Additionally there is moderate diffuse encephalopathy, nonspecific etiology. No definite seizures were seen throughout the recording.  Lora Havens   Discharge Instructions: Discharge Instructions    Call MD for:  difficulty breathing, headache or visual disturbances   Complete by: As directed    Call MD for:  severe uncontrolled pain   Complete by: As directed    Diet - low sodium heart healthy   Complete by: As directed    Discharge instructions   Complete by: As directed    Mr. Usery,  It was a pleasure taking care of you during this admission.  You were hospitalized for confusion due to bleeding around your brain.  This is likely due to your low platelet count.  You will be going to inpatient rehab for physical therapy.  Please continue taking Keppra, antiepileptic drug, as instructed.  Please follow-up with Dr. Irene Limbo for your myelodysplastic syndrome.  Take care   Increase activity slowly   Complete by: As directed       Signed: Gaylan Gerold, DO 03/16/2020, 11:19 AM    Pager: [phone]

## 2020-03-17 LAB — BPAM RBC
Blood Product Expiration Date: 202202112359
Blood Product Expiration Date: 202202142359
ISSUE DATE / TIME: 202202041648
Unit Type and Rh: 600
Unit Type and Rh: 600

## 2020-03-17 LAB — BPAM PLATELET PHERESIS
Blood Product Expiration Date: 202202092359
ISSUE DATE / TIME: 202202071606
Unit Type and Rh: 1700

## 2020-03-17 LAB — PREPARE PLATELET PHERESIS: Unit division: 0

## 2020-03-17 LAB — TYPE AND SCREEN
ABO/RH(D): A POS
Antibody Screen: POSITIVE
Donor AG Type: NEGATIVE
Donor AG Type: NEGATIVE
Unit division: 0
Unit division: 0

## 2020-03-17 LAB — CBC
HCT: 23.5 % — ABNORMAL LOW (ref 39.0–52.0)
Hemoglobin: 7.6 g/dL — ABNORMAL LOW (ref 13.0–17.0)
MCH: 31.3 pg (ref 26.0–34.0)
MCHC: 32.3 g/dL (ref 30.0–36.0)
MCV: 96.7 fL (ref 80.0–100.0)
Platelets: 52 10*3/uL — ABNORMAL LOW (ref 150–400)
RBC: 2.43 MIL/uL — ABNORMAL LOW (ref 4.22–5.81)
RDW: 17.3 % — ABNORMAL HIGH (ref 11.5–15.5)
WBC: 4.1 10*3/uL (ref 4.0–10.5)
nRBC: 0 % (ref 0.0–0.2)

## 2020-03-17 MED ORDER — BISACODYL 10 MG RE SUPP
10.0000 mg | Freq: Once | RECTAL | Status: AC
  Administered 2020-03-17: 10 mg via RECTAL
  Filled 2020-03-17: qty 1

## 2020-03-17 NOTE — Progress Notes (Signed)
Name: Shaun Hobbs DOB: [birth year]  Please be advised that the above-named patient has a primary diagnosis of dementia which supersedes any psychiatric diagnosis.

## 2020-03-17 NOTE — Plan of Care (Signed)

## 2020-03-17 NOTE — Progress Notes (Signed)
Subjective:   Hospital day: 9  Overnight event: No acute event   Patient is seen at bedside.  He appears comfortable in no acute distress.  He nodded when asked if he is doing okay.  [facility] inpatient rehab denied his bed offer today with a concern of his mental status and platelet count.  I had a long conversation with patient daughter, Shaun Hobbs, about his discharge plan.  I expressed that patient may not be suitable for the intense training of CIR and we may need to look at other options such as SNF or home health.  Patient is put back on a waiting list for CIR but it is difficult to find a bed for him.  Patient's daughter is more receptive of the idea of SNF and will look more into SNF options.   Objective:  Vital signs in last 24 hours: Vitals:   03/16/20 1945 03/16/20 2318 03/17/20 0319 03/17/20 0809  BP: 110/61 122/75 110/61 (!) 103/52  Pulse: 72 73 68 65  Resp: 18 15 16 14   Temp: (!) 100.7 F (38.2 C) 98 F (36.7 C) 98.5 F (36.9 C) (!) 97.5 F (36.4 C)  TempSrc: Oral Oral Oral Oral  SpO2: 95% 97% 95% 97%   CBC Latest Ref Rng & Units 03/17/2020 03/16/2020 03/15/2020  WBC 4.0 - 10.5 K/uL 4.1 3.2(L) 3.5(L)  Hemoglobin 13.0 - 17.0 g/dL 7.6(L) 8.0(L) 7.5(L)  Hematocrit 39.0 - 52.0 % 23.5(L) 23.7(L) 22.5(L)  Platelets 150 - 400 K/uL 52(L) 17(LL) 21(LL)    Physical Exam  Physical Exam Constitutional:      General: He is not in acute distress.    Comments: Less verbal today  HENT:     Head: Normocephalic.  Eyes:     General:        Right eye: No discharge.        Left eye: No discharge.  Cardiovascular:     Rate and Rhythm: Normal rate and regular rhythm.  Pulmonary:     Effort: Pulmonary effort is normal. No respiratory distress.  Skin:    General: Skin is warm.  Neurological:     Mental Status: He is alert.  Psychiatric:        Mood and Affect: Mood normal.     Assessment/Plan: [name] is an 87 year old male with PMHx of advanced Alzheimer's  dementia, hypothyroidism and myelodysplastic syndrome presenting with altered mental status in setting of spontaneous subdural hematoma.Pending CIR placement vs SNF.   Principal Problem:   Subdural hematoma (HCC) Active Problems:   Myelodysplasia (myelodysplastic syndrome) (HCC)   Dementia without behavioral disturbance (HCC)   Thrombocytopenia (HCC)   Palliative care by specialist   Goals of care, counseling/discussion   Encounter for hospice care discussion   DNI (do not intubate)  AMS 2/2 Spontaneous Subdural Hematoma: Unfortunately [facility] rejected patient's bed offer today.  We will continue discussion with family about discharge plan.  Family is more receptive of SNF and will look more into SNF placement.  Appreciate social worker's help. -P.o. Keppra 750 mg twice daily -Continue to monitor mental status -Continue PT/OT  Thrombocytopenia 2/2 Myelodysplastic syndrome  Patient with history of myelodysplastic syndrome followed by Dr. Irene Limbo. Patient with baseline platelet count less than 5 as outpatient. In setting of his subdural hematoma, recommendation to keep platelets >20.   His platelets improved to 52 unit of transfusion yesterday. -Continue to trend CBC -Transfuse for goal Plt>20 and Hb>7  Urinary Retention in setting  of BPH Patient currently has Foley in setting of urinary retention and possible bladder stretch injury -Monitor urinary output -Continue Flomax  Alzheimer's dementia:  Continue Aricept  Hypothyroidism:  Continue Synthroid  Code Status: DNR/DNI Diet: Dysphagia I IV Fluids: None DVT Prophylaxis: SCD's  Prior to Admission Living Arrangement: Home Anticipated Discharge Location:  To be determined Barriers to Discharge:  Placement   Gaylan Gerold, DO 03/17/2020, 11:49 AM Pager: [phone] After 5pm on weekdays and 1pm on weekends: On Call pager [phone]

## 2020-03-17 NOTE — Plan of Care (Signed)
  Problem: Education: Goal: Knowledge of General Education information will improve Description: Including pain rating scale, medication(s)/side effects and non-pharmacologic comfort measures 03/17/2020 2352 by Florestine Avers, RN Outcome: Progressing 03/17/2020 2351 by Florestine Avers, RN Outcome: Progressing   Problem: Health Behavior/Discharge Planning: Goal: Ability to manage health-related needs will improve 03/17/2020 2352 by Florestine Avers, RN Outcome: Progressing 03/17/2020 2351 by Florestine Avers, RN Outcome: Progressing   Problem: Clinical Measurements: Goal: Ability to maintain clinical measurements within normal limits will improve 03/17/2020 2352 by Florestine Avers, RN Outcome: Progressing 03/17/2020 2351 by Florestine Avers, RN Outcome: Progressing Goal: Will remain free from infection 03/17/2020 2352 by Florestine Avers, RN Outcome: Progressing 03/17/2020 2351 by Florestine Avers, RN Outcome: Progressing Goal: Diagnostic test results will improve 03/17/2020 2352 by Florestine Avers, RN Outcome: Progressing 03/17/2020 2351 by Florestine Avers, RN Outcome: Progressing Goal: Respiratory complications will improve 03/17/2020 2352 by Florestine Avers, RN Outcome: Progressing 03/17/2020 2351 by Florestine Avers, RN Outcome: Progressing Goal: Cardiovascular complication will be avoided 03/17/2020 2352 by Florestine Avers, RN Outcome: Progressing 03/17/2020 2351 by Florestine Avers, RN Outcome: Progressing   Problem: Activity: Goal: Risk for activity intolerance will decrease 03/17/2020 2352 by Florestine Avers, RN Outcome: Progressing 03/17/2020 2351 by Florestine Avers, RN Outcome: Progressing   Problem: Nutrition: Goal: Adequate nutrition will be maintained 03/17/2020 2352 by Florestine Avers, RN Outcome: Progressing 03/17/2020 2351 by Florestine Avers, RN Outcome: Progressing   Problem: Coping: Goal: Level of anxiety will  decrease 03/17/2020 2352 by Florestine Avers, RN Outcome: Progressing 03/17/2020 2351 by Florestine Avers, RN Outcome: Progressing   Problem: Elimination: Goal: Will not experience complications related to bowel motility 03/17/2020 2352 by Florestine Avers, RN Outcome: Progressing 03/17/2020 2351 by Florestine Avers, RN Outcome: Progressing Goal: Will not experience complications related to urinary retention 03/17/2020 2352 by Florestine Avers, RN Outcome: Progressing 03/17/2020 2351 by Florestine Avers, RN Outcome: Progressing   Problem: Pain Managment: Goal: General experience of comfort will improve 03/17/2020 2352 by Florestine Avers, RN Outcome: Progressing 03/17/2020 2351 by Florestine Avers, RN Outcome: Progressing   Problem: Safety: Goal: Ability to remain free from injury will improve 03/17/2020 2352 by Florestine Avers, RN Outcome: Progressing 03/17/2020 2351 by Florestine Avers, RN Outcome: Progressing   Problem: Skin Integrity: Goal: Risk for impaired skin integrity will decrease 03/17/2020 2352 by Florestine Avers, RN Outcome: Progressing 03/17/2020 2351 by Florestine Avers, RN Outcome: Progressing

## 2020-03-17 NOTE — NC FL2 (Addendum)
[place] LEVEL OF CARE SCREENING TOOL     IDENTIFICATION  Patient Name: Shaun Hobbs Birthdate: [birth year] Sex: male Admission Date (Current Location): 03/08/2020  [facility] and [place] Number:  Herbalist and Address:  [facility], [place] [address] [place], [place] [address]      Provider Number: [id number]  Attending Physician Name and Address:  Aldine Contes, MD  Relative Name and Phone Number:       Current Level of Care: Hospital Recommended Level of Care: [place] Prior Approval Number:    Date Approved/Denied:   PASRR Number: Manual Review  Discharge Plan: SNF    Current Diagnoses: Patient Active Problem List   Diagnosis Date Noted  . Palliative care by specialist   . Goals of care, counseling/discussion   . Encounter for hospice care discussion   . DNI (do not intubate)   . Subdural hematoma ([place]) 03/08/2020  . Chronic ITP (idiopathic thrombocytopenia) (HCC) 10/24/2019  . Counseling regarding advance care planning and goals of care 10/24/2019  . Benign essential hypertension 02/26/2019  . Graves disease 02/26/2019  . History of lumbar laminectomy 02/26/2019  . Hypercholesterolemia 02/26/2019  . Lumbar back pain 02/26/2019  . Thrombocytopenia ([place]) 02/26/2019  . Pneumonia due to COVID-19 virus 11/25/2018  . Myelodysplasia (myelodysplastic syndrome) ([place]) 11/25/2018  . Dementia without behavioral disturbance ([place]) 11/25/2018  . Anemia with low platelet count ([place]) 11/14/2018  . Benign prostatic hyperplasia 11/14/2018  . Memory loss 11/14/2018  . Advance care planning 01/12/2018  . Chemotherapy induced diarrhea 05/09/2017  . Early onset Alzheimer's dementia without behavioral disturbance ([place]) 05/09/2017  . IFG (impaired fasting glucose) 05/09/2017  . Bilateral impacted cerumen 04/15/2016  . Dermatitis 02/23/2016  . Bilateral carotid bruits 12/09/2015  . DNR (do not resuscitate)  12/09/2015  . Other microscopic hematuria 12/09/2015  . Hypothyroidism 12/08/2015  . Vitamin D deficiency 12/08/2015    Orientation RESPIRATION BLADDER Height & Weight     Self,Place  Normal Incontinent,Indwelling catheter Weight:   Height:     BEHAVIORAL SYMPTOMS/MOOD NEUROLOGICAL BOWEL NUTRITION STATUS      Incontinent Diet (See DC Summary)  AMBULATORY STATUS COMMUNICATION OF NEEDS Skin   Limited Assist Verbally Normal                       Personal Care Assistance Level of Assistance  Bathing,Feeding,Dressing Bathing Assistance: Limited assistance Feeding assistance: Limited assistance Dressing Assistance: Limited assistance     Functional Limitations Info  Sight,Hearing,Speech Sight Info: Adequate Hearing Info: Adequate Speech Info: Adequate    SPECIAL CARE FACTORS FREQUENCY  PT (By licensed PT),OT (By licensed OT)     PT Frequency: 5xweek OT Frequency: 5xweek            Contractures Contractures Info: Not present    Additional Factors Info  Code Status,Allergies Code Status Info: DNR Allergies Info: Sulfa Antibiotics           Current Medications (03/17/2020):  This is the current hospital active medication list Current Facility-Administered Medications  Medication Dose Route Frequency Provider Last Rate Last Admin  . 0.9 %  sodium chloride infusion (Manually program via Guardrails IV Fluids)   Intravenous Once Jeralyn Bennett, MD      . 0.9 %  sodium chloride infusion (Manually program via Guardrails IV Fluids)   Intravenous Once Jeralyn Bennett, MD      . 0.9 %  sodium chloride infusion  10 mL/hr Intravenous  Once Varney Biles, MD 10 mL/hr at 03/11/20 2220 10 mL/hr at 03/11/20 2220  . acetaminophen (TYLENOL) tablet 650 mg  650 mg Oral Q6H PRN Maudie Mercury, MD   650 mg at 03/13/20 1643   Or  . acetaminophen (TYLENOL) suppository 650 mg  650 mg Rectal Q6H PRN Maudie Mercury, MD   650 mg at 03/09/20 2009  . aminocaproic acid (AMICAR) 25 %  solution 1,250 mg  1,250 mg Oral TID Gaylan Gerold, DO   1,250 mg at 03/17/20 1002  . chlorhexidine (PERIDEX) 0.12 % solution 15 mL  15 mL Mouth Rinse BID Aldine Contes, MD   15 mL at 03/17/20 1002  . Chlorhexidine Gluconate Cloth 2 % PADS 6 each  6 each Topical Daily Axel Filler, MD   6 each at 03/16/20 1122  . donepezil (ARICEPT) tablet 10 mg  10 mg Oral Daily Gaylan Gerold, DO   10 mg at 03/17/20 1002  . levETIRAcetam (KEPPRA) tablet 750 mg  750 mg Oral BID Aslam, Sadia, MD   750 mg at 03/17/20 1002  . levothyroxine (SYNTHROID) tablet 100 mcg  100 mcg Oral QAC breakfast Gaylan Gerold, DO   100 mcg at 03/17/20 [phone]  . MEDLINE mouth rinse  15 mL Mouth Rinse BID Joni Reining C, DO   15 mL at 03/17/20 1004  . polyethylene glycol (MIRALAX / GLYCOLAX) packet 17 g  17 g Oral Daily PRN Maudie Mercury, MD   17 g at 03/14/20 0917  . tamsulosin (FLOMAX) capsule 0.4 mg  0.4 mg Oral QHS Gaylan Gerold, DO   0.4 mg at 03/16/20 2212     Discharge Medications: Please see discharge summary for a list of discharge medications.  Relevant Imaging Results:  Relevant Lab Results:   Additional Information SSN# [id number]  [place] Setting, Student-Social Work

## 2020-03-17 NOTE — Progress Notes (Signed)
Occupational Therapy Treatment Patient Details Name: LONDEN BOK MRN: [id number] DOB: Aug 12, 1928 Today's Date: 03/17/2020    History of present illness BARRETT GOLDIE is a 87 y.o. with pertinent PMH of myelodysplastic syndrome, alzheimer's dementia, and hypothyroidism  who presented with altered mental status, found to have a left-sided subdural hematoma.   OT comments  Pt making steady progress towards OT goals this session. Session focus on functional transfer training this session. Pt demonstrates improvements in activity tolerance with pt able to tolerate x2 stand pivot transfer from EOB>recliner.BSC>recliner. pt continues to present with baseline cognitive deficits and impaired balance impacting pts ability to complete BADLs independently. Pt initally required ste by step cues to sequence pivotal steps but able to progress to no cues and MIN A with RW as session progressed. Pt would continue to benefit from skilled occupational therapy while admitted and after d/c to address the below listed limitations in order to improve overall functional mobility and facilitate independence with BADL participation. DC plan remains appropriate, although per notes pt likely to go to IR in [place], will follow acutely per POC and update DC recs as needed.    Follow Up Recommendations  Home health OT;SNF;Supervision/Assistance - 24 hour    Equipment Recommendations  3 in 1 bedside commode    Recommendations for Other Services      Precautions / Restrictions Precautions Precautions: Fall Restrictions Weight Bearing Restrictions: No       Mobility Bed Mobility Overal bed mobility: Needs Assistance Bed Mobility: Supine to Sit     Supine to sit: HOB elevated;Mod assist     General bed mobility comments: pt required step by step tactile and verbal cues to sequence all aspects of bed mobility. MOD A to scoot hips forward and to elevate trunk in sitting  Transfers Overall transfer level:  Needs assistance Equipment used: Rolling walker (2 wheeled) Transfers: Sit to/from [facility] Sit to Stand: Min assist;Min guard;From elevated surface Stand pivot transfers: Min assist       General transfer comment: pt able to sit<>stand from EOB wtih MIN A to rise, pt prefers to pull on RW therefore assist needed to stablize RW. pt progressed to minguard to rise from [facility], MIN A for x2 SPT from EOB>recliner>BSC. pt initally required ste by step cues to sequence pivotal steps but able to progress to no cues and MIN A as session progressed    Balance Overall balance assessment: Needs assistance Sitting-balance support: No upper extremity supported;Feet supported Sitting balance-Leahy Scale: Fair Sitting balance - Comments: able to sit EOB with minguard with no LOB   Standing balance support: Bilateral upper extremity supported;During functional activity Standing balance-Leahy Scale: Poor Standing balance comment: reliant on BUE support                           ADL either performed or assessed with clinical judgement   ADL Overall ADL's : Needs assistance/impaired                         Toilet Transfer: Minimal assistance;RW;Stand-pivot;Cueing for sequencing           Functional mobility during ADLs: Minimal assistance;Rolling walker;Cueing for sequencing General ADL Comments: pt continues to present with cognitive deficits, impaired balance and decreased activity tolerance     Vision       Perception     Praxis      Cognition Arousal/Alertness: Awake/alert  Behavior During Therapy: Flat affect Overall Cognitive Status: History of cognitive impairments - at baseline                                 General Comments: pt following commands with increased time needing tactile cues at times, pt able to state name but remains very flat during session        Exercises     Shoulder Instructions       General  Comments VS, pts daughter present    Pertinent Vitals/ Pain       Pain Assessment: Faces Faces Pain Scale: No hurt  Home Living                                          Prior Functioning/Environment              Frequency  Min 2X/week        Progress Toward Goals  OT Goals(current goals can now be found in the care plan section)  Progress towards OT goals: Progressing toward goals  Acute Rehab OT Goals Time For Goal Achievement: 03/25/20 Potential to Achieve Goals: Good  Plan Discharge plan remains appropriate;Frequency remains appropriate    Co-evaluation                 AM-PAC OT "6 Clicks" Daily Activity     Outcome Measure   Help from another person eating meals?: A Little (set- up) Help from another person taking care of personal grooming?: A Little Help from another person toileting, which includes using toliet, bedpan, or urinal?: A Little Help from another person bathing (including washing, rinsing, drying)?: A Lot Help from another person to put on and taking off regular upper body clothing?: A Little Help from another person to put on and taking off regular lower body clothing?: A Lot 6 Click Score: 16    End of Session Equipment Utilized During Treatment: Gait belt;Rolling walker;Other (comment) (BSC)  OT Visit Diagnosis: Unsteadiness on feet (R26.81);Other abnormalities of gait and mobility (R26.89);Muscle weakness (generalized) (M62.81);History of falling (Z91.81)   Activity Tolerance Patient tolerated treatment well   Patient Left with call bell/phone within reach;with chair alarm set;in chair   Nurse Communication Mobility status        Time: 5638-9373 OT Time Calculation (min): 22 min  Charges: OT General Charges $OT Visit: 1 Visit OT Treatments $Self Care/Home Management : 8-22 mins  Harley Alto., COTA/L Acute Rehabilitation Services [place]    Precious Haws 03/17/2020, 12:48  PM

## 2020-03-17 NOTE — TOC Progression Note (Addendum)
Transition of Care [facility]) - Progression Note    Patient Details  Name: JYAIR KIRALY MRN: [id number] Date of Birth: 1928-12-27  [facility] CM/SW Contact  Pollie Friar, RN Phone Number: 03/17/2020, 11:49 AM  Clinical Narrative:    Pt unable to d/c yesterday to [facility] d/t his low platelet count. Pt transfused yesterday and with better counts today. CM received phone call from [facility] admissions that they are withdrawing the bed offer. They state he is not a good fit for their rehab. CM has updated the MD and spoken to the daughter.  TOC following.  1400: CM met with patient and daughter again to go over SNF rehab. She is in agreement with having him faxed out but would prefer CIR.  Pt will need plan for infusions prior to SNF d/c so the facility will have a clear idea of when and where infusions will take place.  Expected Discharge Plan: IP Rehab Facility Barriers to Discharge: No Barriers Identified  Expected Discharge Plan and Services Expected Discharge Plan: [place]     Post Acute Care Choice: IP Rehab Living arrangements for the past 2 months: Single Family Home Expected Discharge Date: 03/16/20                                     Social Determinants of Health (SDOH) Interventions    Readmission Risk Interventions No flowsheet data found.

## 2020-03-17 NOTE — Progress Notes (Signed)
Name: Shaun Hobbs DOB: 02/23/1928  Please be advised that the above-named patient will require a short-term nursing home stay -- anticipated 30 days or less for rehabilitation and strengthening. The plan is for return home.

## 2020-03-17 NOTE — Progress Notes (Signed)
Physical Therapy Treatment Patient Details Name: Shaun Hobbs MRN: [id number] DOB: 12/11/1928 Today's Date: 03/17/2020    History of Present Illness Shaun Hobbs is a 87 y.o. with pertinent PMH of myelodysplastic syndrome, alzheimer's dementia, and hypothyroidism  who presented with altered mental status, found to have a left-sided subdural hematoma.    PT Comments    Patient progressing slowly towards PT goals. Requires Min A for transfers and gait training with use of RW for support. Pt drifting towards right and needs cues for RW management. Pt with flat affect and no verbalizations today. Presents with baseline cognitive deficits and impaired balance/weakness impacting overall safety mobility and putting pt at increased risk for falls. Will continue to follow.    Follow Up Recommendations  CIR (vs HHPT with hired care)     Equipment Recommendations  3in1 (PT)    Recommendations for Other Services       Precautions / Restrictions Precautions Precautions: Fall Restrictions Weight Bearing Restrictions: No    Mobility  Bed Mobility Overal bed mobility: Needs Assistance Bed Mobility: Supine to Sit     Supine to sit: HOB elevated;Mod assist     General bed mobility comments: Up in chair upon PT arrival.  Transfers Overall transfer level: Needs assistance Equipment used: Rolling walker (2 wheeled) Transfers: Sit to/from Stand Sit to Stand: Min assist;Min guard Stand pivot transfers: Min assist       General transfer comment: Min A to power to standing with manual assist for hand placement. Stood from chair x1, from [employer]. SPT bed to chair with Min A and cues.  Ambulation/Gait Ambulation/Gait assistance: Min assist Gait Distance (Feet): 150 Feet Assistive device: Rolling walker (2 wheeled) Gait Pattern/deviations: Step-through pattern;Trunk flexed;Decreased stride length Gait velocity: reduced Gait velocity interpretation: <1.8 ft/sec, indicate of risk for  recurrent falls General Gait Details: Slow, mildly unsteady gait with tendency to drift towards right. Min A for balance and RW management.   Stairs             Wheelchair Mobility    Modified Rankin (Stroke Patients Only) Modified Rankin (Stroke Patients Only) Pre-Morbid Rankin Score: No significant disability Modified Rankin: Moderately severe disability     Balance Overall balance assessment: Needs assistance Sitting-balance support: No upper extremity supported;Feet supported Sitting balance-Leahy Scale: Fair Sitting balance - Comments: able to sit EOB with minguard with no LOB   Standing balance support: During functional activity Standing balance-Leahy Scale: Poor Standing balance comment: reliant on BUE support                            Cognition Arousal/Alertness: Awake/alert Behavior During Therapy: Flat affect Overall Cognitive Status: History of cognitive impairments - at baseline                                 General Comments: pt following commands with increased time needing tactile cues at times, flat throughout session, no verbalizations.      Exercises      General Comments General comments (skin integrity, edema, etc.): daughter present during session. HR bradycardic initially in high 40s prior to session, increased to 70s with mobility.      Pertinent Vitals/Pain Pain Assessment: Faces Faces Pain Scale: No hurt    Home Living  Prior Function            PT Goals (current goals can now be found in the care plan section) Progress towards PT goals: Progressing toward goals    Frequency    Min 4X/week      PT Plan Current plan remains appropriate    Co-evaluation              AM-PAC PT "6 Clicks" Mobility   Outcome Measure  Help needed turning from your back to your side while in a flat bed without using bedrails?: A Little Help needed moving from lying on your back  to sitting on the side of a flat bed without using bedrails?: A Little Help needed moving to and from a bed to a chair (including a wheelchair)?: A Little Help needed standing up from a chair using your arms (e.g., wheelchair or bedside chair)?: A Little Help needed to walk in hospital room?: A Little Help needed climbing 3-5 steps with a railing? : A Lot 6 Click Score: 17    End of Session Equipment Utilized During Treatment: Gait belt Activity Tolerance: Patient tolerated treatment well Patient left: in chair;with call bell/phone within reach;with chair alarm set;with family/visitor present Nurse Communication: Mobility status PT Visit Diagnosis: Unsteadiness on feet (R26.81)     Time: 2863-8177 PT Time Calculation (min) (ACUTE ONLY): 22 min  Charges:  $Gait Training: 8-22 mins                     Marisa Severin, PT, DPT Acute Rehabilitation Services Pager [phone] Office [place] 03/17/2020, 2:21 PM

## 2020-03-17 NOTE — Progress Notes (Signed)
Inpatient Rehabilitation Admissions Coordinator  Notified by RN CM that pt not to be admitted to [place]. I met with patient's daughter, [name] at bedside. We do not have a CIR bed today for his admission. Kitty aware that other rehab venues will need to be sought in case CIR bed does not become available. I will follow up tomorrow  Danne Baxter, RN, MSN Rehab Admissions Coordinator [phone] 03/17/2020 12:31 PM

## 2020-03-17 NOTE — H&P (Signed)
Physical Medicine and Rehabilitation Admission H&P    Chief Complaint  Patient presents with  . AMS  . Weakness  : HPI: Shaun Hobbs is an 87 year old right-handed male with history of Covid 2021, myelodysplastic syndrome followed by Dr. Irene Limbo, Alzheimer's disease maintained on Aricept, hypothyroidism. History taken from chart review and wife. Patient lives with spouse.  Independent prior to admission with decreased safety awareness. He presented on 03/08/2020 with AMS and generalized weakness. Wife had noted generalized decline over the past week. Head CT scan showed small SDH along left tentorium extending onto left falx and anteriorly into the medial aspect of the left middle cranial fossa as well as extending into the left occipital region measuring up to 4 mm thickness.  Additional small amount of subdural blood overlying the left occipital lobe posteriorly.  No mass-effect or midline shift.  Admission chemistries lactic acid 0.9, sodium 128, glucose 122 hemoglobin 7.4, platelets less than 5, blood cultures no growth to date, urinalysis negative nitrite.  Neurosurgery consulted and recommended conservative care. EEG negative for seizures. There was moderate diffuse encephalopathy.  Maintained on Keppra for seizure prophylaxis. Hospital course further complicated by dysphagia and started on dysphagia #1, thin liquid diet.  Bouts of urinary retention with Foley catheter tube placed patient remains on Flomax planning voiding trial.  Palliative care consulted to establish goals of care.  Therapy evaluations completed due to patient's altered mental status and decreased functional mobility was admitted for a comprehensive rehab program. Please see preadmission assessment from earlier today as well.  Review of Systems  Unable to perform ROS: Dementia   Past Medical History:  Diagnosis Date  . Anemia   . Cancer ([place]) 2008   Myelodysplastic syndrome  . Dementia ([place])   . Hypothyroidism     History reviewed. No pertinent surgical history. Unable to obtain from patient No family history on file. Unable to obtain from patient Social History:  reports that he quit smoking about 41 years ago. He has never used smokeless tobacco. He reports current alcohol use. He reports that he does not use drugs. Allergies:  Allergies  Allergen Reactions  . Sulfa Antibiotics Other (See Comments)    Patient reports: reaction in past    Medications Prior to Admission  Medication Sig Dispense Refill  . Cholecalciferol (VITAMIN D3 PO) Take 1,000 Units by mouth daily.     Marland Kitchen donepezil (ARICEPT) 10 MG tablet Take 10 mg by mouth daily.    Marland Kitchen levothyroxine (SYNTHROID, LEVOTHROID) 100 MCG tablet Take 100 mcg by mouth daily before breakfast.    . tamsulosin (FLOMAX) 0.4 MG CAPS capsule Take 0.4 mg by mouth at bedtime.    . tranexamic acid (LYSTEDA) 650 MG TABS tablet Take 1 tablet (650 mg total) by mouth 2 (two) times daily. 180 tablet 0  . triamcinolone lotion (KENALOG) 0.1 % Apply 1 application topically 3 (three) times daily. 120 mL 2  . mupirocin ointment (BACTROBAN) 2 % Apply 1 application topically 2 (two) times daily. To area of induration on forearm (Patient not taking: No sig reported) 22 g 2    Drug Regimen Review Drug regimen was reviewed and remains appropriate with no significant issues identified  Home: Home Living Family/patient expects to be discharged to:: Private residence Living Arrangements: Spouse/significant other Available Help at Discharge: Available 24 hours/day Type of Home: House Home Access: Stairs to enter [employer] of Steps: 1 Home Layout: One level Bathroom Shower/Tub: Tub/shower Psychologist, occupational: Standard Bathroom Accessibility:  Yes Home Equipment: Walker - 2 wheels,Grab bars - toilet,Grab bars - tub/shower,Other (comment),Shower seat (walking stick) Additional Comments: pt with baseline dementia; wife states pt can get up and  dressed and walk around house without assist. S needed when out of house. wife performs Nurse, learning disability and medicine management  Lives With: Spouse   Functional History: Prior Function Level of Independence: Independent Gait / Transfers Assistance Needed: supervision with RW ADL's / Homemaking Assistance Needed: supervision  Functional Status:  Mobility: Bed Mobility Overal bed mobility: Needs Assistance Bed Mobility: Supine to Sit Rolling: Min assist Sidelying to sit: Min assist Supine to sit: HOB elevated,Mod assist Sit to supine: Supervision General bed mobility comments: Up in chair upon PT arrival. Transfers Overall transfer level: Needs assistance Equipment used: Rolling walker (2 wheeled) Transfers: Sit to/from Stand Sit to Stand: Min assist,Min guard Stand pivot transfers: Min assist General transfer comment: Min A to power to standing with manual assist for hand placement. Stood from chair x1, from [employer]. SPT bed to chair with Min A and cues. Ambulation/Gait Ambulation/Gait assistance: Min assist Gait Distance (Feet): 150 Feet Assistive device: Rolling walker (2 wheeled) Gait Pattern/deviations: Step-through pattern,Trunk flexed,Decreased stride length General Gait Details: Slow, mildly unsteady gait with tendency to drift towards right. Min A for balance and RW management. Gait velocity: reduced Gait velocity interpretation: <1.8 ft/sec, indicate of risk for recurrent falls    ADL: ADL Overall ADL's : Needs assistance/impaired Grooming: Oral care,Brushing hair,Minimal assistance,Sitting,Cueing for sequencing Grooming Details (indicate cue type and reason): able to initiate tasks with visual demo-not responding verbally but follows gestural cues Upper Body Dressing : Minimal assistance,Sitting Upper Body Dressing Details (indicate cue type and reason): Donned anterior hospital gown. Lower Body Dressing: Minimal assistance,Sit to/from stand Lower Body Dressing  Details (indicate cue type and reason): Able to doff/don footwear seated EOB with Min gaurd and more than increased time. Toilet Transfer: Minimal assistance,RW,Stand-pivot,Cueing for sequencing Toilet Transfer Details (indicate cue type and reason): simulated from eob to recliner transfer [place] and Hygiene: Moderate assistance Toileting - Clothing Manipulation Details (indicate cue type and reason): Patient incontinent of bowel prior to toiet transfer but seemingly unaware. Mod A for hygiene/clothing management. Functional mobility during ADLs: Minimal assistance,Rolling walker,Cueing for sequencing General ADL Comments: pt continues to present with cognitive deficits, impaired balance and decreased activity tolerance  Cognition: Cognition Overall Cognitive Status: History of cognitive impairments - at baseline Orientation Level: Oriented X4 Cognition Arousal/Alertness: Awake/alert Behavior During Therapy: Flat affect Overall Cognitive Status: History of cognitive impairments - at baseline Area of Impairment: Attention,Memory,Following commands,Safety/judgement,Awareness,Problem solving Current Attention Level: Focused Memory: Decreased recall of precautions,Decreased short-term memory Following Commands: Follows one step commands with increased time Safety/Judgement: Decreased awareness of safety,Decreased awareness of deficits Awareness: Intellectual Problem Solving: Slow processing,Requires verbal cues,Requires tactile cues General Comments: pt following commands with increased time needing tactile cues at times, flat throughout session, no verbalizations.  Physical Exam: Blood pressure 106/60, pulse 64, temperature 98.4 F (36.9 C), temperature source Oral, resp. rate (!) 21, height 5\' 10"  (1.778 m), weight 71.4 kg, SpO2 95 %. Physical Exam Vitals reviewed.  Constitutional:      General: He is not in acute distress. HENT:     Head: Normocephalic and  atraumatic.     Right Ear: External ear normal.     Left Ear: External ear normal.     Nose: Nose normal.  Eyes:     General:        Right  eye: No discharge.        Left eye: No discharge.     Comments: EOM appear intact  Cardiovascular:     Rate and Rhythm: Normal rate and regular rhythm.  Pulmonary:     Effort: Pulmonary effort is normal. No respiratory distress.     Breath sounds: No stridor.  Abdominal:     General: Abdomen is flat. Bowel sounds are normal. There is no distension.  Genitourinary:    Comments: Indwelling Foley tube in place Musculoskeletal:     Cervical back: Normal range of motion and neck supple.     Comments: No edema or tenderness in extremities  Skin:    General: Skin is warm and dry.  Neurological:     Mental Status: He is alert.     Comments: Alert-mostly stare Oriented to first name only Limited engagement and following commands, however moving all extremities spontaneously  Psychiatric:     Comments: Limited due to dementia     Results for orders placed or performed during the hospital encounter of 03/08/20 (from the past 48 hour(s))  Prepare Pheresed Platelets     Status: None   Collection Time: 03/16/20  2:26 PM  Result Value Ref Range   Unit Number [id number]    Blood Component Type PSORALEN TREATED    Unit division 00    Status of Unit ISSUED,FINAL    Transfusion Status      OK TO TRANSFUSE Performed at [facility], [place] [address] [place], [place] [address]   CBC     Status: Abnormal   Collection Time: 03/16/20  3:04 PM  Result Value Ref Range   WBC 3.2 (L) 4.0 - 10.5 K/uL   RBC 2.45 (L) 4.22 - 5.81 MIL/uL   Hemoglobin 8.0 (L) 13.0 - 17.0 g/dL   HCT 23.7 (L) 39.0 - 52.0 %   MCV 96.7 80.0 - 100.0 fL   MCH 32.7 26.0 - 34.0 pg   MCHC 33.8 30.0 - 36.0 g/dL   RDW 17.9 (H) 11.5 - 15.5 %   Platelets 17 (LL) 150 - 400 K/uL    Comment: REPEATED TO VERIFY Immature Platelet Fraction may be clinically indicated,  consider ordering this additional test [id number] CRITICAL VALUE NOTED.  VALUE IS CONSISTENT WITH PREVIOUSLY REPORTED AND CALLED VALUE.    nRBC 0.0 0.0 - 0.2 %    Comment: Performed at [facility], [place] [address], [place], [place] [address]  CBC     Status: Abnormal   Collection Time: 03/17/20 12:34 AM  Result Value Ref Range   WBC 4.1 4.0 - 10.5 K/uL   RBC 2.43 (L) 4.22 - 5.81 MIL/uL   Hemoglobin 7.6 (L) 13.0 - 17.0 g/dL   HCT 23.5 (L) 39.0 - 52.0 %   MCV 96.7 80.0 - 100.0 fL   MCH 31.3 26.0 - 34.0 pg   MCHC 32.3 30.0 - 36.0 g/dL   RDW 17.3 (H) 11.5 - 15.5 %   Platelets 52 (L) 150 - 400 K/uL    Comment: Immature Platelet Fraction may be clinically indicated, consider ordering this additional test [id number] CONSISTENT WITH PREVIOUS RESULT    nRBC 0.0 0.0 - 0.2 %    Comment: Performed at [facility], [place] [address] [place], [place] [address]   No results found.     Medical Problem List and Plan: 1.  Progressive weakness with altered mental status secondary to nontraumatic left SDH  -patient may shower  -ELOS/Goals:  10-14 days/supervision/min a  Admit to CIR 2.  Antithrombotics: -DVT/anticoagulation: SCDs  -antiplatelet therapy: N/A 3. Pain Management: Tylenol as needed 4. Mood: Aricept 10 mg daily  -antipsychotic agents: N/A 5. Neuropsych: This patient is not capable of making decisions on his own behalf. 6. Skin/Wound Care: Routine skin checks 7. Fluids/Electrolytes/Nutrition: Routine in and outs  CMP ordered for tomorrow. 8.  Dysphagia.  Dysphagia #1 thin liquid diet  Advance diet as tolerated 9.  Seizure prophylaxis. Keppra 750 mg twice daily, plan to wean 10.  Myelodysplastic syndrome/thrombocytopenia.  Latest platelet count 52,000.  Follow-up outpatient Dr. Irene Limbo.  Continue Amicar 1250 mg 3 times daily  CBC ordered for tomorrow 11. Hypothyroidism: Synthroid 12.  BPH/urinary retention.  Foley tube in place.  Flomax 0.4 mg nightly.    Plan for  voiding trial  Cathlyn Parsons, PA-C 03/18/2020  I have personally performed a face to face diagnostic evaluation, including, but not limited to relevant history and physical exam findings, of this patient and developed relevant assessment and plan.  Additionally, I have reviewed and concur with the physician assistant's documentation above.  Delice Lesch, MD, ABPMR

## 2020-03-18 ENCOUNTER — Other Ambulatory Visit: Payer: Self-pay

## 2020-03-18 ENCOUNTER — Inpatient Hospital Stay (HOSPITAL_COMMUNITY)
Admission: RE | Admit: 2020-03-18 | Discharge: 2020-03-28 | DRG: 057 | Disposition: A | Discharge status: Hospice | Payer: Medicare Other | Source: Intra-hospital | Attending: Physical Medicine & Rehabilitation | Admitting: Physical Medicine & Rehabilitation

## 2020-03-18 ENCOUNTER — Encounter (HOSPITAL_COMMUNITY): Payer: Self-pay | Admitting: Physical Medicine & Rehabilitation

## 2020-03-18 DIAGNOSIS — R32 Unspecified urinary incontinence: Secondary | ICD-10-CM | POA: Diagnosis present

## 2020-03-18 DIAGNOSIS — Z87891 Personal history of nicotine dependence: Secondary | ICD-10-CM

## 2020-03-18 DIAGNOSIS — S065X9A Traumatic subdural hemorrhage with loss of consciousness of unspecified duration, initial encounter: Secondary | ICD-10-CM | POA: Diagnosis present

## 2020-03-18 DIAGNOSIS — E162 Hypoglycemia, unspecified: Secondary | ICD-10-CM | POA: Diagnosis not present

## 2020-03-18 DIAGNOSIS — R339 Retention of urine, unspecified: Secondary | ICD-10-CM | POA: Diagnosis not present

## 2020-03-18 DIAGNOSIS — I69291 Dysphagia following other nontraumatic intracranial hemorrhage: Secondary | ICD-10-CM

## 2020-03-18 DIAGNOSIS — Z79899 Other long term (current) drug therapy: Secondary | ICD-10-CM

## 2020-03-18 DIAGNOSIS — R627 Adult failure to thrive: Secondary | ICD-10-CM | POA: Diagnosis present

## 2020-03-18 DIAGNOSIS — Z7189 Other specified counseling: Secondary | ICD-10-CM | POA: Diagnosis not present

## 2020-03-18 DIAGNOSIS — G309 Alzheimer's disease, unspecified: Secondary | ICD-10-CM | POA: Diagnosis not present

## 2020-03-18 DIAGNOSIS — Z882 Allergy status to sulfonamides status: Secondary | ICD-10-CM

## 2020-03-18 DIAGNOSIS — R000 Tachycardia, unspecified: Secondary | ICD-10-CM

## 2020-03-18 DIAGNOSIS — E039 Hypothyroidism, unspecified: Secondary | ICD-10-CM | POA: Diagnosis present

## 2020-03-18 DIAGNOSIS — R319 Hematuria, unspecified: Secondary | ICD-10-CM | POA: Diagnosis not present

## 2020-03-18 DIAGNOSIS — N390 Urinary tract infection, site not specified: Secondary | ICD-10-CM | POA: Diagnosis present

## 2020-03-18 DIAGNOSIS — R1310 Dysphagia, unspecified: Secondary | ICD-10-CM | POA: Diagnosis present

## 2020-03-18 DIAGNOSIS — S065XAA Traumatic subdural hemorrhage with loss of consciousness status unknown, initial encounter: Secondary | ICD-10-CM | POA: Diagnosis present

## 2020-03-18 DIAGNOSIS — Z79890 Hormone replacement therapy (postmenopausal): Secondary | ICD-10-CM | POA: Diagnosis not present

## 2020-03-18 DIAGNOSIS — Z66 Do not resuscitate: Secondary | ICD-10-CM | POA: Diagnosis present

## 2020-03-18 DIAGNOSIS — N401 Enlarged prostate with lower urinary tract symptoms: Secondary | ICD-10-CM | POA: Diagnosis present

## 2020-03-18 DIAGNOSIS — I69211 Memory deficit following other nontraumatic intracranial hemorrhage: Principal | ICD-10-CM

## 2020-03-18 DIAGNOSIS — F0280 Dementia in other diseases classified elsewhere without behavioral disturbance: Secondary | ICD-10-CM | POA: Diagnosis present

## 2020-03-18 DIAGNOSIS — Z8616 Personal history of COVID-19: Secondary | ICD-10-CM

## 2020-03-18 DIAGNOSIS — D469 Myelodysplastic syndrome, unspecified: Secondary | ICD-10-CM | POA: Diagnosis present

## 2020-03-18 DIAGNOSIS — R338 Other retention of urine: Secondary | ICD-10-CM | POA: Diagnosis present

## 2020-03-18 DIAGNOSIS — D696 Thrombocytopenia, unspecified: Secondary | ICD-10-CM | POA: Diagnosis present

## 2020-03-18 DIAGNOSIS — Z515 Encounter for palliative care: Secondary | ICD-10-CM | POA: Diagnosis not present

## 2020-03-18 DIAGNOSIS — I69298 Other sequelae of other nontraumatic intracranial hemorrhage: Secondary | ICD-10-CM | POA: Diagnosis not present

## 2020-03-18 MED ORDER — TAMSULOSIN HCL 0.4 MG PO CAPS
0.4000 mg | ORAL_CAPSULE | Freq: Every day | ORAL | Status: DC
  Administered 2020-03-18 – 2020-03-27 (×10): 0.4 mg via ORAL
  Filled 2020-03-18 (×10): qty 1

## 2020-03-18 MED ORDER — LEVETIRACETAM 500 MG PO TABS
750.0000 mg | ORAL_TABLET | Freq: Two times a day (BID) | ORAL | Status: DC
  Administered 2020-03-18 – 2020-03-19 (×2): 750 mg via ORAL
  Filled 2020-03-18 (×2): qty 1

## 2020-03-18 MED ORDER — AMINOCAPROIC ACID 0.25 GM/ML PO SOLN
1250.0000 mg | Freq: Three times a day (TID) | ORAL | Status: DC
  Administered 2020-03-18 – 2020-03-28 (×29): 1250 mg via ORAL
  Filled 2020-03-18 (×30): qty 5

## 2020-03-18 MED ORDER — DONEPEZIL HCL 10 MG PO TABS
10.0000 mg | ORAL_TABLET | Freq: Every day | ORAL | Status: DC
  Administered 2020-03-19 – 2020-03-28 (×10): 10 mg via ORAL
  Filled 2020-03-18 (×11): qty 1

## 2020-03-18 MED ORDER — LEVOTHYROXINE SODIUM 100 MCG PO TABS
100.0000 ug | ORAL_TABLET | Freq: Every day | ORAL | Status: DC
  Administered 2020-03-19 – 2020-03-28 (×10): 100 ug via ORAL
  Filled 2020-03-18 (×10): qty 1

## 2020-03-18 NOTE — Progress Notes (Signed)
Inpatient Rehabilitation Medication Review by a Pharmacist  A complete drug regimen review was completed for this patient to identify any potential clinically significant medication issues.  Clinically significant medication issues were identified:  no   Type of Medication Issue Identified Description of Issue Urgent (address now) Non-Urgent (address on AM team rounds) Plan   Drug Interaction(s) (clinically significant)       Duplicate Therapy       Allergy       No Medication Administration End Date       Incorrect Dose       Additional Drug Therapy Needed       Other  Home tranexamic acid tablets replaced with aminocaproic acid solution while in CIR  Home triamcinolone lotion TID not restarted  Non-urgent  F/u insurance coverage at discharge, may be difficult to get aminocaproic acid solution outpatient   Restart if needed       Time spent performing this drug regimen review (minutes):  [place], PharmD, [place], [facility] Clinical Pharmacist  Please check AMION for all [place] phone numbers After 10:00 PM, call [place]

## 2020-03-18 NOTE — Progress Notes (Signed)
Physical Therapy Treatment Patient Details Name: Shaun Hobbs MRN: [id number] DOB: 06-11-1928 Today's Date: 03/18/2020    History of Present Illness Shaun Hobbs is a 87 y.o. with pertinent PMH of myelodysplastic syndrome, alzheimer's dementia, and hypothyroidism  who presented with altered mental status, found to have a left-sided subdural hematoma.    PT Comments    On arrival to room pt laying in stool. With assist from tech pt was cleaned up and progressed OOB to attempt ambulation. After ambulating 8 ft pt became incontinent of bowls and required seated break for peri care and gown change. RN present and requesting pt return to bed to transfer to CIR. Pt continuing with bowl incontinence on return to bed. RN and tech present and performing peri care at end of session. Transfer to CIR today.     Follow Up Recommendations  CIR (vs HHPT with hired care)     Equipment Recommendations  3in1 (PT)    Recommendations for Other Services Rehab consult     Precautions / Restrictions Precautions Precautions: Fall Precaution Comments: incontinent of stool    Mobility  Bed Mobility Overal bed mobility: Needs Assistance Bed Mobility: Supine to Sit;Rolling Rolling: Min assist   Supine to sit: HOB elevated;Min assist Sit to supine: Supervision   General bed mobility comments: min A to roll for peri care and min A to elevate trunk with multimodal cues for technique and sequencing  Transfers Overall transfer level: Needs assistance Equipment used: Rolling walker (2 wheeled) Transfers: Sit to/from Stand Sit to Stand: Min assist Stand pivot transfers: Min assist       General transfer comment: min A to rise from EOB and transport chair.  Ambulation/Gait Ambulation/Gait assistance: Min assist Gait Distance (Feet): 8 Feet Assistive device: Rolling walker (2 wheeled) Gait Pattern/deviations: Step-through pattern;Trunk flexed;Decreased stride length Gait velocity: reduced    General Gait Details: Pt made it ~8 ft before becoming incontinent of bowls.   Stairs             Wheelchair Mobility    Modified Rankin (Stroke Patients Only) Modified Rankin (Stroke Patients Only) Pre-Morbid Rankin Score: No significant disability Modified Rankin: Moderately severe disability     Balance Overall balance assessment: Needs assistance Sitting-balance support: No upper extremity supported;Feet supported Sitting balance-Leahy Scale: Fair Sitting balance - Comments: able to sit EOB with minguard with no LOB   Standing balance support: During functional activity Standing balance-Leahy Scale: Poor Standing balance comment: reliant on BUE support                            Cognition Arousal/Alertness: Awake/alert Behavior During Therapy: Flat affect Overall Cognitive Status: History of cognitive impairments - at baseline Area of Impairment: Attention;Memory;Following commands;Safety/judgement;Awareness;Problem solving                   Current Attention Level: Focused Memory: Decreased recall of precautions;Decreased short-term memory Following Commands: Follows one step commands with increased time Safety/Judgement: Decreased awareness of safety;Decreased awareness of deficits Awareness: Intellectual Problem Solving: Slow processing;Requires verbal cues;Requires tactile cues General Comments: pt able to follow commands with increased time. Minimal verbalizing during session.      Exercises      General Comments General comments (skin integrity, edema, etc.): wife present for session      Pertinent Vitals/Pain Pain Assessment: No/denies pain Faces Pain Scale: No hurt    Home Living  Prior Function            PT Goals (current goals can now be found in the care plan section) Acute Rehab PT Goals PT Goal Formulation: With family Time For Goal Achievement: 03/24/20 Potential to Achieve Goals:  Fair Progress towards PT goals: Progressing toward goals    Frequency    Min 4X/week      PT Plan Current plan remains appropriate    Co-evaluation              AM-PAC PT "6 Clicks" Mobility   Outcome Measure  Help needed turning from your back to your side while in a flat bed without using bedrails?: A Little Help needed moving from lying on your back to sitting on the side of a flat bed without using bedrails?: A Little Help needed moving to and from a bed to a chair (including a wheelchair)?: A Little Help needed standing up from a chair using your arms (e.g., wheelchair or bedside chair)?: A Little Help needed to walk in hospital room?: A Little Help needed climbing 3-5 steps with a railing? : A Lot 6 Click Score: 17    End of Session Equipment Utilized During Treatment: Gait belt Activity Tolerance: Patient tolerated treatment well Patient left: with call bell/phone within reach;with chair alarm set;with family/visitor present;in bed;with nursing/sitter in room Nurse Communication: Mobility status PT Visit Diagnosis: Unsteadiness on feet (R26.81)     Time: 1449-1530 PT Time Calculation (min) (ACUTE ONLY): 41 min  Charges:  $Gait Training: 8-22 mins $Therapeutic Activity: 23-37 mins                     Benjiman Core, [place] Pager [phone] Acute Rehab   Allena Katz 03/18/2020, 3:41 PM

## 2020-03-18 NOTE — H&P (Signed)
Physical Medicine and Rehabilitation Admission H&P    Chief Complaint  Patient presents with  . AMS  . Weakness  : HPI: Shaun Hobbs is an 87 year old right-handed male with history of Covid 2021, myelodysplastic syndrome followed by Dr. Irene Limbo, Alzheimer's disease maintained on Aricept, hypothyroidism. History taken from chart review and wife. Patient lives with spouse.  Independent prior to admission with decreased safety awareness. He presented on 03/08/2020 with AMS and generalized weakness. Wife had noted generalized decline over the past week. Head CT scan showed small SDH along left tentorium extending onto left falx and anteriorly into the medial aspect of the left middle cranial fossa as well as extending into the left occipital region measuring up to 4 mm thickness.  Additional small amount of subdural blood overlying the left occipital lobe posteriorly.  No mass-effect or midline shift.  Admission chemistries lactic acid 0.9, sodium 128, glucose 122 hemoglobin 7.4, platelets less than 5, blood cultures no growth to date, urinalysis negative nitrite.  Neurosurgery consulted and recommended conservative care. EEG negative for seizures. There was moderate diffuse encephalopathy.  Maintained on Keppra for seizure prophylaxis. Hospital course further complicated by dysphagia and started on dysphagia #1, thin liquid diet.  Bouts of urinary retention with Foley catheter tube placed patient remains on Flomax planning voiding trial.  Palliative care consulted to establish goals of care.  Therapy evaluations completed due to patient's altered mental status and decreased functional mobility was admitted for a comprehensive rehab program. Please see preadmission assessment from earlier today as well.  Review of Systems  Unable to perform ROS: Dementia   Past Medical History:  Diagnosis Date  . Anemia   . Cancer ([place]) 2008   Myelodysplastic syndrome  . Dementia ([place])   . Hypothyroidism     History reviewed. No pertinent surgical history. Unable to obtain from patient No family history on file. Unable to obtain from patient Social History:  reports that he quit smoking about 41 years ago. He has never used smokeless tobacco. He reports current alcohol use. He reports that he does not use drugs. Allergies:  Allergies  Allergen Reactions  . Sulfa Antibiotics Other (See Comments)    Patient reports: reaction in past    Medications Prior to Admission  Medication Sig Dispense Refill  . Cholecalciferol (VITAMIN D3 PO) Take 1,000 Units by mouth daily.     Marland Kitchen donepezil (ARICEPT) 10 MG tablet Take 10 mg by mouth daily.    Marland Kitchen levothyroxine (SYNTHROID, LEVOTHROID) 100 MCG tablet Take 100 mcg by mouth daily before breakfast.    . tamsulosin (FLOMAX) 0.4 MG CAPS capsule Take 0.4 mg by mouth at bedtime.    . tranexamic acid (LYSTEDA) 650 MG TABS tablet Take 1 tablet (650 mg total) by mouth 2 (two) times daily. 180 tablet 0  . triamcinolone lotion (KENALOG) 0.1 % Apply 1 application topically 3 (three) times daily. 120 mL 2  . mupirocin ointment (BACTROBAN) 2 % Apply 1 application topically 2 (two) times daily. To area of induration on forearm (Patient not taking: No sig reported) 22 g 2    Drug Regimen Review Drug regimen was reviewed and remains appropriate with no significant issues identified  Home: Home Living Family/patient expects to be discharged to:: Private residence Living Arrangements: Spouse/significant other Available Help at Discharge: Available 24 hours/day Type of Home: House Home Access: Stairs to enter [employer] of Steps: 1 Home Layout: One level Bathroom Shower/Tub: Tub/shower Psychologist, occupational: Standard Bathroom Accessibility:  Yes Home Equipment: Walker - 2 wheels,Grab bars - toilet,Grab bars - tub/shower,Other (comment),Shower seat (walking stick) Additional Comments: pt with baseline dementia; wife states pt can get up and  dressed and walk around house without assist. S needed when out of house. wife performs Nurse, learning disability and medicine management  Lives With: Spouse   Functional History: Prior Function Level of Independence: Independent Gait / Transfers Assistance Needed: supervision with RW ADL's / Homemaking Assistance Needed: supervision  Functional Status:  Mobility: Bed Mobility Overal bed mobility: Needs Assistance Bed Mobility: Supine to Sit Rolling: Min assist Sidelying to sit: Min assist Supine to sit: HOB elevated,Mod assist Sit to supine: Supervision General bed mobility comments: Up in chair upon PT arrival. Transfers Overall transfer level: Needs assistance Equipment used: Rolling walker (2 wheeled) Transfers: Sit to/from Stand Sit to Stand: Min assist,Min guard Stand pivot transfers: Min assist General transfer comment: Min A to power to standing with manual assist for hand placement. Stood from chair x1, from [employer]. SPT bed to chair with Min A and cues. Ambulation/Gait Ambulation/Gait assistance: Min assist Gait Distance (Feet): 150 Feet Assistive device: Rolling walker (2 wheeled) Gait Pattern/deviations: Step-through pattern,Trunk flexed,Decreased stride length General Gait Details: Slow, mildly unsteady gait with tendency to drift towards right. Min A for balance and RW management. Gait velocity: reduced Gait velocity interpretation: <1.8 ft/sec, indicate of risk for recurrent falls    ADL: ADL Overall ADL's : Needs assistance/impaired Grooming: Oral care,Brushing hair,Minimal assistance,Sitting,Cueing for sequencing Grooming Details (indicate cue type and reason): able to initiate tasks with visual demo-not responding verbally but follows gestural cues Upper Body Dressing : Minimal assistance,Sitting Upper Body Dressing Details (indicate cue type and reason): Donned anterior hospital gown. Lower Body Dressing: Minimal assistance,Sit to/from stand Lower Body Dressing  Details (indicate cue type and reason): Able to doff/don footwear seated EOB with Min gaurd and more than increased time. Toilet Transfer: Minimal assistance,RW,Stand-pivot,Cueing for sequencing Toilet Transfer Details (indicate cue type and reason): simulated from eob to recliner transfer [place] and Hygiene: Moderate assistance Toileting - Clothing Manipulation Details (indicate cue type and reason): Patient incontinent of bowel prior to toiet transfer but seemingly unaware. Mod A for hygiene/clothing management. Functional mobility during ADLs: Minimal assistance,Rolling walker,Cueing for sequencing General ADL Comments: pt continues to present with cognitive deficits, impaired balance and decreased activity tolerance  Cognition: Cognition Overall Cognitive Status: History of cognitive impairments - at baseline Orientation Level: Oriented X4 Cognition Arousal/Alertness: Awake/alert Behavior During Therapy: Flat affect Overall Cognitive Status: History of cognitive impairments - at baseline Area of Impairment: Attention,Memory,Following commands,Safety/judgement,Awareness,Problem solving Current Attention Level: Focused Memory: Decreased recall of precautions,Decreased short-term memory Following Commands: Follows one step commands with increased time Safety/Judgement: Decreased awareness of safety,Decreased awareness of deficits Awareness: Intellectual Problem Solving: Slow processing,Requires verbal cues,Requires tactile cues General Comments: pt following commands with increased time needing tactile cues at times, flat throughout session, no verbalizations.  Physical Exam: Blood pressure 106/60, pulse 64, temperature 98.4 F (36.9 C), temperature source Oral, resp. rate (!) 21, height 5\' 10"  (1.778 m), weight 71.4 kg, SpO2 95 %. Physical Exam Vitals reviewed.  Constitutional:      General: He is not in acute distress. HENT:     Head: Normocephalic and  atraumatic.     Right Ear: External ear normal.     Left Ear: External ear normal.     Nose: Nose normal.  Eyes:     General:        Right  eye: No discharge.        Left eye: No discharge.     Comments: EOM appear intact  Cardiovascular:     Rate and Rhythm: Normal rate and regular rhythm.  Pulmonary:     Effort: Pulmonary effort is normal. No respiratory distress.     Breath sounds: No stridor.  Abdominal:     General: Abdomen is flat. Bowel sounds are normal. There is no distension.  Genitourinary:    Comments: Indwelling Foley tube in place Musculoskeletal:     Cervical back: Normal range of motion and neck supple.     Comments: No edema or tenderness in extremities  Skin:    General: Skin is warm and dry.  Neurological:     Mental Status: He is alert.     Comments: Alert-mostly stare Oriented to first name only Limited engagement and following commands, however moving all extremities spontaneously  Psychiatric:     Comments: Limited due to dementia     Results for orders placed or performed during the hospital encounter of 03/08/20 (from the past 48 hour(s))  Prepare Pheresed Platelets     Status: None   Collection Time: 03/16/20  2:26 PM  Result Value Ref Range   Unit Number [id number]    Blood Component Type PSORALEN TREATED    Unit division 00    Status of Unit ISSUED,FINAL    Transfusion Status      OK TO TRANSFUSE Performed at [facility], [place] [address], [place], [place] [address]   CBC     Status: Abnormal   Collection Time: 03/16/20  3:04 PM  Result Value Ref Range   WBC 3.2 (L) 4.0 - 10.5 K/uL   RBC 2.45 (L) 4.22 - 5.81 MIL/uL   Hemoglobin 8.0 (L) 13.0 - 17.0 g/dL   HCT 23.7 (L) 39.0 - 52.0 %   MCV 96.7 80.0 - 100.0 fL   MCH 32.7 26.0 - 34.0 pg   MCHC 33.8 30.0 - 36.0 g/dL   RDW 17.9 (H) 11.5 - 15.5 %   Platelets 17 (LL) 150 - 400 K/uL    Comment: REPEATED TO VERIFY Immature Platelet Fraction may be clinically indicated,  consider ordering this additional test [id number] CRITICAL VALUE NOTED.  VALUE IS CONSISTENT WITH PREVIOUSLY REPORTED AND CALLED VALUE.    nRBC 0.0 0.0 - 0.2 %    Comment: Performed at [facility], [place] [address], [place], [place] [address]  CBC     Status: Abnormal   Collection Time: 03/17/20 12:34 AM  Result Value Ref Range   WBC 4.1 4.0 - 10.5 K/uL   RBC 2.43 (L) 4.22 - 5.81 MIL/uL   Hemoglobin 7.6 (L) 13.0 - 17.0 g/dL   HCT 23.5 (L) 39.0 - 52.0 %   MCV 96.7 80.0 - 100.0 fL   MCH 31.3 26.0 - 34.0 pg   MCHC 32.3 30.0 - 36.0 g/dL   RDW 17.3 (H) 11.5 - 15.5 %   Platelets 52 (L) 150 - 400 K/uL    Comment: Immature Platelet Fraction may be clinically indicated, consider ordering this additional test [id number] CONSISTENT WITH PREVIOUS RESULT    nRBC 0.0 0.0 - 0.2 %    Comment: Performed at [facility], [place] [address], [place],  [address]   No results found.     Medical Problem List and Plan: 1.  Progressive weakness with altered mental status secondary to nontraumatic left SDH  -patient may shower  -ELOS/Goals:  10-14 days/supervision/min a  Admit to CIR 2.  Antithrombotics: -DVT/anticoagulation: SCDs  -antiplatelet therapy: N/A 3. Pain Management: Tylenol as needed 4. Mood: Aricept 10 mg daily  -antipsychotic agents: N/A 5. Neuropsych: This patient is not capable of making decisions on his own behalf. 6. Skin/Wound Care: Routine skin checks 7. Fluids/Electrolytes/Nutrition: Routine in and outs  CMP ordered for tomorrow. 8.  Dysphagia.  Dysphagia #1 thin liquid diet  Advance diet as tolerated 9.  Seizure prophylaxis. Keppra 750 mg twice daily, plan to wean 10.  Myelodysplastic syndrome/thrombocytopenia.  Latest platelet count 52,000.  Follow-up outpatient Dr. Irene Limbo.  Continue Amicar 1250 mg 3 times daily  CBC ordered for tomorrow 11. Hypothyroidism: Synthroid 12.  BPH/urinary retention.  Foley tube in place.  Flomax 0.4 mg nightly.    Plan for  voiding trial  Cathlyn Parsons, PA-C 03/18/2020  I have personally performed a face to face diagnostic evaluation, including, but not limited to relevant history and physical exam findings, of this patient and developed relevant assessment and plan.  Additionally, I have reviewed and concur with the physician assistant's documentation above.  Delice Lesch, MD, ABPMR  The patient's status has not changed. Any changes from the pre-admission screening or documentation from the acute chart are noted above.   Delice Lesch, MD, ABPMR

## 2020-03-18 NOTE — TOC Transition Note (Signed)
Transition of Care [facility]) - CM/SW Discharge Note   Patient Details  Name: AULTON ROUTT MRN: [id number] Date of Birth: [birth year]  [facility] CM/SW Contact:  Pollie Friar, RN Phone Number: 03/18/2020, 10:52 AM   Clinical Narrative:    Pt discharging to CIR today. CM signing off.   Final next level of care: IP Rehab Facility Barriers to Discharge: No Barriers Identified   Patient Goals and CMS Choice Patient states their goals for this hospitalization and ongoing recovery are:: Unable to assess patient onlt oriented to self. CMS Medicare.gov Compare Post Acute Care list provided to:: Patient Represenative (must comment) Choice offered to / list presented to : Adult Children,Spouse  Discharge Placement                       Discharge Plan and Services     Post Acute Care Choice: IP Rehab                               Social Determinants of Health (SDOH) Interventions     Readmission Risk Interventions No flowsheet data found.

## 2020-03-18 NOTE — Progress Notes (Signed)
Inpatient Rehabilitation  Patient information reviewed and entered into eRehab system by Idelle Reimann M. Pennye Beeghly, M.A., CCC/SLP, PPS Coordinator.  Information including medical coding, functional ability and quality indicators will be reviewed and updated through discharge.    

## 2020-03-18 NOTE — Progress Notes (Signed)
Pt admitted to room 4M03. Denies pain or discomfort. Reviewed rehab policy and visitation guidelines with pt's spouse. States understanding and no further questions at this time.   Gerald Stabs, RN

## 2020-03-18 NOTE — Progress Notes (Signed)
[facility] ([facility])  Hospital Liaison: RN note         This patient has been referred to our palliative care services in the community.  [facility] will continue to follow for any discharge planning needs and to coordinate continuation of palliative care in the outpatient setting.    If you have questions or need assistance, please call [phone] or contact the hospital Liaison listed on AMION.      Thank you for this referral.         Mary Anne Robertson, RN, CCM  [facility] Hospital Liaison (listed on AMION under Hospice/[facility])    [phone]     

## 2020-03-18 NOTE — PMR Pre-admission (Addendum)
PMR Admission Coordinator Pre-Admission Assessment  Patient: Shaun Hobbs is an 87 y.o., male MRN: [id number] DOB: Oct 14, 1928 Height: 5\' 10"  (177.8 cm) Weight: 71.4 kg              Insurance Information HMO:     PPO:      PCP:      IPA:      80/20:      OTHER:  PRIMARY: Medicare a and b      Policy#: [id number]      Subscriber: pt Benefits:  Phone #: passport one online     Name: 2/8 Eff. Date: a 12/09/1995 an db 01/08/1996     Deduct: $1556      Out of Pocket Max: none      Life Max: none  CIR: 100%      SNF: 20 full days Outpatient: 80%     Co-Pay: 20% Home Health: 100%      Co-Pay: none DME: 80%     Co-Pay: 20% Providers: pt choice  SECONDARY: Tricare for Life      Policy#: [id number]  Financial Counselor:       Phone#:   The "Data Collection Information Summary" for patients in Inpatient Rehabilitation Facilities with attached "Privacy Act [place] Records" was provided and verbally reviewed with: Family  Emergency Contact Information Contact Information     Name Relation Home Work Mobile   Colbey, Wirtanen Spouse   [phone]   Elyse Jarvis Daughter [phone]  [phone]   Lucciano, Vitali [phone]      Current Medical History  Patient Admitting Diagnosis: SDH  History of Present Illness::  87 year old right-handed male with history of Covid 2021, myelodysplastic syndrome followed by Dr. Irene Limbo, Alzheimer's disease maintained on Aricept, hypothyroidism.  Per chart review lives with spouse.  Independent prior to admission with decreased safety awareness.  Presented 03/08/2020 with altered mental status and generalized weakness.  Wife had noted generalized decline over the past week.  Cranial CT scan showed small subdural hematoma along the left tentorium extending onto left falx and anteriorly into the medial aspect of the left middle cranial fossa as well as extending into the left occipital region measuring up to 4 mm thickness.   Additional small amount of subdural blood overlying the left occipital lobe posteriorly.  No mass-effect or midline shift.  Admission chemistries lactic acid 0.9 sodium 128 glucose 122 hemoglobin 7.4 platelets less than 5, blood cultures no growth to date, urinalysis negative nitrite.  Neurosurgery followed Dr. Venetia Constable with conservative care.  EEG negative for seizure during recording.  There was moderate diffuse encephalopathy.  Maintained on Keppra for seizure prophylaxis.  Dysphagia #1 thin liquid diet.  Bouts of urinary retention with Foley catheter tube placed patient remains on Flomax planning voiding trial.  Palliative care consulted to establish goals of care .   Past Medical History  Past Medical History:  Diagnosis Date   Anemia    Cancer ([place]) 2008   Myelodysplastic syndrome   Dementia ([place])    Hypothyroidism     Family History  family history is not on file.  Prior Rehab/Hospitalizations:  Has the patient had prior rehab or hospitalizations prior to admission? Yes  Has the patient had major surgery during 100 days prior to admission? No  Current Medications   Current Facility-Administered Medications:    0.9 %  sodium chloride infusion (Manually program via Guardrails IV Fluids), , Intravenous, Once, Jeralyn Bennett, MD   0.9 %  sodium  chloride infusion (Manually program via Guardrails IV Fluids), , Intravenous, Once, Jeralyn Bennett, MD   0.9 %  sodium chloride infusion, 10 mL/hr, Intravenous, Once, Kathrynn Humble, Ankit, MD, Last Rate: 10 mL/hr at 03/11/20 2220, 10 mL/hr at 03/11/20 2220   acetaminophen (TYLENOL) tablet 650 mg, 650 mg, Oral, Q6H PRN, 650 mg at 03/13/20 1643 **OR** acetaminophen (TYLENOL) suppository 650 mg, 650 mg, Rectal, Q6H PRN, Maudie Mercury, MD, 650 mg at 03/09/20 2009   aminocaproic acid (AMICAR) 25 % solution 1,250 mg, 1,250 mg, Oral, TID, Gaylan Gerold, DO, 1,250 mg at 03/17/20 2220   chlorhexidine (PERIDEX) 0.12 % solution 15 mL, 15 mL, Mouth Rinse,  BID, Narendra, Nischal, MD, 15 mL at 03/17/20 2219   Chlorhexidine Gluconate Cloth 2 % PADS 6 each, 6 each, Topical, Daily, Axel Filler, MD, 6 each at 03/17/20 1655   donepezil (ARICEPT) tablet 10 mg, 10 mg, Oral, Daily, Gaylan Gerold, DO, 10 mg at 03/17/20 1002   levETIRAcetam (KEPPRA) tablet 750 mg, 750 mg, Oral, BID, Aslam, Sadia, MD, 750 mg at 03/17/20 2220   levothyroxine (SYNTHROID) tablet 100 mcg, 100 mcg, Oral, QAC breakfast, Gaylan Gerold, DO, 100 mcg at 03/18/20 8366   MEDLINE mouth rinse, 15 mL, Mouth Rinse, BID, Hoffman, Erik C, DO, 15 mL at 03/17/20 2220   polyethylene glycol (MIRALAX / GLYCOLAX) packet 17 g, 17 g, Oral, Daily PRN, Maudie Mercury, MD, 17 g at 03/14/20 2947   tamsulosin (FLOMAX) capsule 0.4 mg, 0.4 mg, Oral, QHS, Gaylan Gerold, DO, 0.4 mg at 03/17/20 2220  Patients Current Diet:  Diet Order             Diet - low sodium heart healthy           DIET - DYS 1 Room service appropriate? Yes; Fluid consistency: Thin  Diet effective now                   Precautions / Restrictions Precautions Precautions: Fall Precaution Comments: incontinent of stool Restrictions Weight Bearing Restrictions: No   Has the patient had 2 or more falls or a fall with injury in the past year?No  Prior Activity Level Limited Community (1-2x/wk): supervison with RW at home due to cognition  Prior Functional Level Prior Function Level of Independence: Independent Gait / Transfers Assistance Needed: supervision with RW ADL's / Homemaking Assistance Needed: supervision  Self Care: Did the patient need help bathing, dressing, using the toilet or eating?  Needed some help  Indoor Mobility: Did the patient need assistance with walking from room to room (with or without device)? Independent  Stairs: Did the patient need assistance with internal or external stairs (with or without device)? Independent  Functional Cognition: Did the patient need help planning regular  tasks such as shopping or remembering to take medications? Needed some help  Home Assistive Devices / [place] Devices/Equipment: Shower chair with back Home Equipment: Walker - 2 wheels,Grab bars - toilet,Grab bars - tub/shower,Other (comment),Shower seat (walking stick)  Prior Device Use: Indicate devices/aids used by the patient prior to current illness, exacerbation or injury? Walker  Current Functional Level Cognition  Overall Cognitive Status: History of cognitive impairments - at baseline Current Attention Level: Focused Orientation Level: Oriented to person Following Commands: Follows one step commands with increased time Safety/Judgement: Decreased awareness of safety,Decreased awareness of deficits General Comments: pt following commands with increased time needing tactile cues at times, flat throughout session, no verbalizations.    Extremity Assessment (includes Sensation/Coordination)  Upper  Extremity Assessment: Generalized weakness  Lower Extremity Assessment: Generalized weakness    ADLs  Overall ADL's : Needs assistance/impaired Grooming: Oral care,Brushing hair,Minimal assistance,Sitting,Cueing for sequencing Grooming Details (indicate cue type and reason): able to initiate tasks with visual demo-not responding verbally but follows gestural cues Upper Body Dressing : Minimal assistance,Sitting Upper Body Dressing Details (indicate cue type and reason): Donned anterior hospital gown. Lower Body Dressing: Minimal assistance,Sit to/from stand Lower Body Dressing Details (indicate cue type and reason): Able to doff/don footwear seated EOB with Min gaurd and more than increased time. Toilet Transfer: Minimal assistance,RW,Stand-pivot,Cueing for sequencing Toilet Transfer Details (indicate cue type and reason): simulated from eob to recliner transfer [place] and Hygiene: Moderate assistance Toileting - Clothing Manipulation Details  (indicate cue type and reason): Patient incontinent of bowel prior to toiet transfer but seemingly unaware. Mod A for hygiene/clothing management. Functional mobility during ADLs: Minimal assistance,Rolling walker,Cueing for sequencing General ADL Comments: pt continues to present with cognitive deficits, impaired balance and decreased activity tolerance    Mobility  Overal bed mobility: Needs Assistance Bed Mobility: Supine to Sit Rolling: Min assist Sidelying to sit: Min assist Supine to sit: HOB elevated,Mod assist Sit to supine: Supervision General bed mobility comments: Up in chair upon PT arrival.    Transfers  Overall transfer level: Needs assistance Equipment used: Rolling walker (2 wheeled) Transfers: Sit to/from Stand Sit to Stand: Min assist,Min guard Stand pivot transfers: Min assist General transfer comment: Min A to power to standing with manual assist for hand placement. Stood from chair x1, from [employer]. SPT bed to chair with Min A and cues.    Ambulation / Gait / Stairs / Wheelchair Mobility  Ambulation/Gait Ambulation/Gait assistance: Herbalist (Feet): 150 Feet Assistive device: Rolling walker (2 wheeled) Gait Pattern/deviations: Step-through pattern,Trunk flexed,Decreased stride length General Gait Details: Slow, mildly unsteady gait with tendency to drift towards right. Min A for balance and RW management. Gait velocity: reduced Gait velocity interpretation: <1.8 ft/sec, indicate of risk for recurrent falls    Posture / Balance Dynamic Sitting Balance Sitting balance - Comments: able to sit EOB with minguard with no LOB Balance Overall balance assessment: Needs assistance Sitting-balance support: No upper extremity supported,Feet supported Sitting balance-Leahy Scale: Fair Sitting balance - Comments: able to sit EOB with minguard with no LOB Standing balance support: During functional activity Standing balance-Leahy Scale: Poor Standing  balance comment: reliant on BUE support    Special needs/care consideration Designated visitor is wife, [name]. Daughter, [name] , needs all plans of care communicated to her for she manages patient's plan of care and wife needs her assistance for planning Palliative following for goals of care and follow up after d/c Patient was to d/c to [facility] on 2/8 due to bed limitations at [facility]; bed offer withdrawn on 2/8 by [place] [facility] MD   Previous Home Environment  Living Arrangements: Spouse/significant other  Lives With: Spouse Available Help at Discharge: Available 24 hours/day Type of Home: [place]: One level Home Access: Stairs to enter [employer] of Steps: 1 Bathroom Shower/Tub: Art therapist: Programmer, systems: Yes Home Care Services: No Additional Comments: pt with baseline dementia; wife states pt can get up and dressed and walk around house without assist. S needed when out of house. wife performs Nurse, learning disability and medicine management  Discharge Living Setting Plans for Discharge Living Setting: Patient's home,Lives with (comment) (spouse) Type of Home at Discharge: [facility]  Discharge Home Layout: One level Discharge Home Access: Stairs to enter Entrance Stairs-Rails: None Entrance Stairs-Number of Steps: 1 Discharge Bathroom Shower/Tub: Tub/shower unit,Walk-in shower Discharge Bathroom Toilet: Standard Discharge Bathroom Accessibility: Yes How Accessible: Accessible via walker Does the patient have any problems obtaining your medications?: No  Social/Family/Support Systems Contact Information: wife will be visitor but all communication but all communication needs to be addressed with daughter, [name] Anticipated Caregiver: wife, family and hired caregivers Anticipated Ambulance person Information: [name] cell [phone] Ability/Limitations of Caregiver: wife [place] and therfore Kitty needs all  communications Caregiver Availability: 24/7 Discharge Plan Discussed with Primary Caregiver: Yes Is Caregiver In Agreement with Plan?: Yes Does Caregiver/Family have Issues with Lodging/Transportation while Pt is in Rehab?: Yes  Goals Patient/Family Goal for Rehab: supervision to min asisst with PT, OT, and SLP Expected length of stay: 10-14 days. Additional Information: baseline dementia; alert; pleasant Pt/Family Agrees to Admission and willing to participate: Yes Program Orientation Provided & Reviewed with Pt/Caregiver Including Roles  & Responsibilities: Yes  Decrease burden of Care through IP rehab admission: n/a  Possible need for SNF placement upon discharge:Family will not go to SNF. They plan to hire assist at home around 12 hours per day as they have done in the past.  Patient Condition: This patient's medical and functional status has changed since the consult dated: 03/12/2020 in which the Rehabilitation Physician determined and documented that the patient's condition is appropriate for intensive rehabilitative care in an inpatient rehabilitation facility. See "History of Present Illness" (above) for medical update. Functional changes are: min assist with mod verbal cues. Patient's medical and functional status update has been discussed with the Rehabilitation physician and patient remains appropriate for inpatient rehabilitation. Will admit to inpatient rehab today.  Preadmission Screen Completed By:  Cleatrice Burke, RN, 03/18/2020 10:36 AM ______________________________________________________________________   Discussed status with Dr. Posey Pronto on 03/18/2020 at  1042 and received approval for admission today.  Admission Coordinator:  Cleatrice Burke, time [id number] Date 03/18/2020

## 2020-03-18 NOTE — Progress Notes (Signed)
Inpatient Rehabilitation Admissions Coordinator  CIR bed is available for patient to admit to today. I met with patient wife at bedside and contacted his daughter, Kitty, by phone. They are in agreement to admit. I have alerted acute team and TOC. I will make the arrangements to admit today.   , RN, MSN Rehab Admissions Coordinator [phone] 03/18/2020 10:25 AM  

## 2020-03-18 NOTE — Progress Notes (Signed)
Courtney Heys, MD  Physician  Physical Medicine and Rehabilitation  Consult Note     Signed  Date of Service:  03/12/2020 11:48 AM      Related encounter: ED to Hosp-Admission (Discharged) from 03/08/2020 in [facility]       Signed      Expand All Collapse All     Show:Clear all [x] Manual[x] Template[] Copied  Added by: [x] Angiulli, Lavon Paganini, PA-C[x] Courtney Heys, MD   [] Hover for details           Physical Medicine and Rehabilitation Consult Reason for Consult: Altered mental status Referring Physician: [facility]     HPI: Shaun Hobbs is a 87 y.o. right-handed male with history of Covid 2021, myelodysplastic syndrome followed by Dr. Irene Limbo, Alzheimer's disease maintained on Aricept, hypothyroidism.  Per chart review patient lives with spouse.  Independent prior to admission with decreased safety awareness.  Presented 03/08/2020 with altered mental status and generalized weakness.  Wife had noted generalized decline over the past week.  Cranial CT scan showed small subdural hematoma along the left tentorium extending onto left falx and anteriorly into the medial aspect of the left middle cranial fossa as well as extending into the left occipital region measuring up to 4 mm thickness.  Additional small amount of subdural blood overlying the left occipital lobe posteriorly.  No mass-effect or midline shift.  Admission chemistries lactic acid 0.9, sodium 128, glucose 122, hemoglobin 7.4, platelets less than 5, blood cultures no growth to date, urinalysis negative nitrite.  Neurosurgery followed Dr. Venetia Constable with conservative care.  EEG negative for seizure during recording.  There was moderate diffuse encephalopathy.  Maintained on Keppra for seizure prophylaxis.  Dysphagia #1 thin liquid diet.  Palliative care consulted to establish goals of care.  Therapy evaluations completed due to patient's altered mental status decreased functional mobility recommendations for  physical medicine rehab consult.   Pt's family reports that wife was basically setting pt up and monitoring him very closely at home with showering, toileting, etc- however basically was supervision level- he's min-mod Assist right now.  Also, wife reports before admission, for at least a few weeks, he was spitting out his food a lot and not eating much. Wouldn't swallow food -or least didn't swallow >50% of it. Doing better on current puree diet.  Review of Systems  Constitutional: Positive for malaise/fatigue. Negative for chills and fever.  Eyes: Negative for blurred vision and double vision.  Respiratory: Negative for cough and shortness of breath.   Cardiovascular: Positive for leg swelling. Negative for chest pain and palpitations.  Gastrointestinal: Positive for constipation. Negative for heartburn, nausea and vomiting.  Genitourinary: Positive for urgency. Negative for dysuria, flank pain and hematuria.  Musculoskeletal: Positive for joint pain and myalgias.  Skin: Negative for rash.  Neurological: Positive for weakness.  Psychiatric/Behavioral: Positive for memory loss.  All other systems reviewed and are negative.       Past Medical History:  Diagnosis Date  . Anemia    . Cancer ([place]) 2008    Myelodysplastic syndrome  . Dementia ([place])    . Hypothyroidism      History reviewed. No pertinent surgical history. No family history on file. Social History:  reports that he quit smoking about 41 years ago. He has never used smokeless tobacco. He reports current alcohol use. He reports that he does not use drugs. Allergies:       Allergies  Allergen Reactions  . Sulfa Antibiotics Other (See Comments)  Patient reports: reaction in past           Medications Prior to Admission  Medication Sig Dispense Refill  . Cholecalciferol (VITAMIN D3 PO) Take 1,000 Units by mouth daily.       Marland Kitchen donepezil (ARICEPT) 10 MG tablet Take 10 mg by mouth daily.      Marland Kitchen levothyroxine  (SYNTHROID, LEVOTHROID) 100 MCG tablet Take 100 mcg by mouth daily before breakfast.      . tamsulosin (FLOMAX) 0.4 MG CAPS capsule Take 0.4 mg by mouth at bedtime.      . tranexamic acid (LYSTEDA) 650 MG TABS tablet Take 1 tablet (650 mg total) by mouth 2 (two) times daily. 180 tablet 0  . triamcinolone lotion (KENALOG) 0.1 % Apply 1 application topically 3 (three) times daily. 120 mL 2  . mupirocin ointment (BACTROBAN) 2 % Apply 1 application topically 2 (two) times daily. To area of induration on forearm (Patient not taking: No sig reported) 22 g 2      Home: Home Living Family/patient expects to be discharged to:: Private residence Living Arrangements: Spouse/significant other Available Help at Discharge: Available 24 hours/day Type of Home: House Home Access: Stairs to enter [employer] of Steps: 1 Home Layout: One level Bathroom Shower/Tub: Tub/shower Psychologist, occupational: Standard Bathroom Accessibility: Yes Home Equipment: Environmental consultant - 2 wheels,Grab bars - toilet,Grab bars - tub/shower,Other (comment),Shower seat (walking stick) Additional Comments: pt with baseline dementia; wife states pt can get up and dressed and walk around house without assist. S needed when out of house. wife performs Nurse, learning disability and medicine management  Functional History: Prior Function Level of Independence: Independent Functional Status:  Mobility: Bed Mobility Overal bed mobility: Needs Assistance Bed Mobility: Supine to Sit Rolling: Min assist Supine to sit: Min assist,HOB elevated Sit to supine: Supervision General bed mobility comments: pt does impulsively attempt to lay down once during session when sitting at the edge of bed Transfers Overall transfer level: Needs assistance Equipment used: Rolling walker (2 wheeled) Transfers: Sit to/from Stand Sit to Stand: Min assist,+2 physical assistance Stand pivot transfers: Min assist General transfer comment: pt  requires verbal and tactile cues. Needs tactile cueing to initiate forward rock into transfers. Pt with difficult time following commands for hand placement Ambulation/Gait Ambulation/Gait assistance: Min assist Gait Distance (Feet): 70 Feet Assistive device: Rolling walker (2 wheeled) Gait Pattern/deviations: Step-to pattern,Trunk flexed General Gait Details: pt with short step-to gait, bumps into one object on R side, improved following of direction with visual cues Gait velocity: reduced Gait velocity interpretation: <1.31 ft/sec, indicative of household ambulator   ADL: ADL Overall ADL's : Needs assistance/impaired Upper Body Dressing : Minimal assistance,Sitting Upper Body Dressing Details (indicate cue type and reason): Donned anterior hospital gown. Lower Body Dressing: Minimal assistance,Sit to/from stand Lower Body Dressing Details (indicate cue type and reason): Able to doff/don footwear seated EOB with Min gaurd and more than increased time. Toilet Transfer: Minimal Technical brewer Details (indicate cue type and reason): To BSC with use of RW. Cues for hand placement. Patient requires increased time. Toileting- Clothing Manipulation and Hygiene: Moderate assistance Toileting - Clothing Manipulation Details (indicate cue type and reason): Patient incontinent of bowel prior to toiet transfer but seemingly unaware. Mod A for hygiene/clothing management. Functional mobility during ADLs: Minimal assistance,Rolling walker General ADL Comments: Min A for functional mobility with HHA vs RW. Ambulates without AD at baseline.   Cognition: Cognition Overall Cognitive Status: Impaired/Different from baseline Orientation Level: Oriented to  person Cognition Arousal/Alertness: Awake/alert Behavior During Therapy: Flat affect Overall Cognitive Status: Impaired/Different from baseline Area of Impairment: Attention,Memory,Following commands,Safety/judgement,Awareness,Problem  solving Current Attention Level: Focused Memory: Decreased recall of precautions,Decreased short-term memory Following Commands: Follows one step commands with increased time Safety/Judgement: Decreased awareness of safety,Decreased awareness of deficits Awareness: Intellectual Problem Solving: Slow processing,Requires verbal cues,Requires tactile cues General Comments: Patient able to state name and DOB. Disoriented to place and situation. Patient follows 1-step verbal commands with increased time and 80% accuracy.   Blood pressure (!) 107/50, pulse 63, temperature 97.6 F (36.4 C), temperature source Oral, resp. rate 20, SpO2 100 %. Physical Exam Vitals and nursing note reviewed. Exam conducted with a chaperone present.  Constitutional:      Comments: Asleep most of time in room, wife and daughter in law and NP for Palliative care in room, kept messing with IV and heart monitor, not agitated, but restless, even while sleeping, NAD  HENT:     Head: Normocephalic and atraumatic.     Comments: Wasn't able to get him to open mouth to assess oropharynx    Right Ear: External ear normal.     Left Ear: External ear normal.     Nose: Nose normal. No congestion.     Mouth/Throat:     Mouth: Mucous membranes are dry.  Eyes:     Comments: Grossly, EOMI as he followed me around the bed- no nystagmus seen  Cardiovascular:     Comments: RRR- no JVD Pulmonary:     Comments: CTA B/L- no W/R/R- good air movement Abdominal:     Comments: Soft, NT, ND, (+)BS - almost cachetic  Musculoskeletal:     Cervical back: Normal range of motion and neck supple.     Comments: Pt able to do some automatic motions- like High five x1 with a lot of cues.  Shook my hand initially and grasped my hand automatically, however couldn't get him to participate in strength exam- did wiggle toes spontaneously, not to direction x1- and was moving all 4 extremities automatically.   Skin:    Comments: IV R forearm- looks  OK Ecchymoses on arms B/L  Neurological:     Comments: Patient is alert.  No acute distress.  Oriented to person but not place or time.  Follows simple commands  Did not follow more than a few commands for me- was able to say Hi and Bye for me, but no other speech heard- just stared at me, when asked his name- did respond to name by opening eyes.    Psychiatric:     Comments: Flat affect-         Lab Results Last 24 Hours       Results for orders placed or performed during the hospital encounter of 03/08/20 (from the past 24 hour(s))  CBC     Status: Abnormal    Collection Time: 03/12/20  3:41 AM  Result Value Ref Range    WBC 3.7 (L) 4.0 - 10.5 K/uL    RBC 2.27 (L) 4.22 - 5.81 MIL/uL    Hemoglobin 7.1 (L) 13.0 - 17.0 g/dL    HCT 22.5 (L) 39.0 - 52.0 %    MCV 99.1 80.0 - 100.0 fL    MCH 31.3 26.0 - 34.0 pg    MCHC 31.6 30.0 - 36.0 g/dL    RDW 18.2 (H) 11.5 - 15.5 %    Platelets 24 (LL) 150 - 400 K/uL    nRBC 0.0 0.0 - 0.2 %  Basic metabolic panel     Status: Abnormal    Collection Time: 03/12/20  3:41 AM  Result Value Ref Range    Sodium 132 (L) 135 - 145 mmol/L    Potassium 3.7 3.5 - 5.1 mmol/L    Chloride 102 98 - 111 mmol/L    CO2 19 (L) 22 - 32 mmol/L    Glucose, Bld 115 (H) 70 - 99 mg/dL    BUN 25 (H) 8 - 23 mg/dL    Creatinine, Ser 0.86 0.61 - 1.24 mg/dL    Calcium 8.1 (L) 8.9 - 10.3 mg/dL    GFR, Estimated >60 >60 mL/min    Anion gap 11 5 - 15      Imaging Results (Last 48 hours)  No results found.       Assessment/Plan: Diagnosis: L SDH- nontraumatic- due to Plts of ,5K 1. Does the need for close, 24 hr/day medical supervision in concert with the patient's rehab needs make it unreasonable for this patient to be served in a less intensive setting? Potentially 2. Co-Morbidities requiring supervision/potential complications: Myelodysplastic syndrome, expressive/receptive aphasia?, encephalopathy, alzhemier's dementia, pancytopenia requiring transfusions q2  weeks 3. Due to bladder management, bowel management, safety, skin/wound care, disease management, medication administration, pain management and patient education, does the patient require 24 hr/day rehab nursing? Potentially 4. Does the patient require coordinated care of a physician, rehab nurse, therapy disciplines of PT, OT and SLP to address physical and functional deficits in the context of the above medical diagnosis(es)? Potentially Addressing deficits in the following areas: balance, endurance, locomotion, strength, transferring, bowel/bladder control, bathing, dressing, feeding, grooming, toileting, cognition, speech, language and swallowing 5. Can the patient actively participate in an intensive therapy program of at least 3 hrs of therapy per day at least 5 days per week? Potentially 6. The potential for patient to make measurable gains while on inpatient rehab is fair 7. Anticipated functional outcomes upon discharge from inpatient rehab are min assist  with PT, min assist with OT, min assist and mod assist with SLP. 8. Estimated rehab length of stay to reach the above functional goals is: 2-3 weeks 9. Anticipated discharge destination: Home 10. Overall Rehab/Functional Prognosis: fair   RECOMMENDATIONS: This patient's condition is appropriate for continued rehabilitative care in the following setting: CIR and SNF Patient has agreed to participate in recommended program. Potentially Note that insurance prior authorization may be required for reimbursement for recommended care.   Comment:  1. Spoke to wife and daughter in Sports coach and NP from Palliative care, Museum/gallery conservator, about rehab- the difference between CIR and SNF- 3 hrs/day vs 1 maybe 2 hours of therapy per day, explained what moderate, minimal assist vs supervision mean and how he's min-mod A right now with therapy, which is at best- of note, slept the rest of afternoon after PT session.  2. If pt went to CIR, he's low level- we might get  3 weeks, based on his diagnosis- maybe a hair more or less, but the average length of stay is 2 weeks in CIR.  3. Also explained that pt will NOT be back at baseline when he went home from CIR due to just needing time- We would be happy to take him, if the plan is for family to take him home after 2-3 weeks. However, if their plan is NOT to take him home- knowing he's going to decline over the next few months medically, SNF placement would be more appropriate.  4. I understand they are unhappy  with a previous SNF experience- we don't need to place at the same place- did explain each is different.  5. Con't Puree diet -D1 with thins- pt tolerating better.  6. Also went over EEG results with pt/family- explained his cognition will likely be the biggest limiting factor in his function.  7. Plan for admission vs SNF- per admissions coordinators- explained they can d/w Pamala Hurry tomorrow to discuss their decision.  8. Thank you for this consult.    I spent a total of 120 minutes total on this consult- >50% coordination of care- 1hr 10 minutes in room with pt, family; 25 minutes reviewing chart and records; and 25 minutes typing up note, after speaking with nurse.       Cathlyn Parsons, PA-C 03/12/2020          Revision History                        Routing History              Note Details  Jan Fireman, MD File Time 03/12/2020  5:03 PM  Author Type Physician Status Signed  Last Editor Courtney Heys, MD Service Physical Medicine and [place] # [id number] Admit Date 03/18/2020

## 2020-03-18 NOTE — Discharge Summary (Signed)
Name: Shaun Hobbs MRN: [id number] DOB: 1928-12-15 87 y.o. PCP: Chesley Noon, MD  Date of Admission: 03/08/2020  9:04 AM Date of Discharge:  03/16/2020 Attending Physician: Aldine Contes, MD  Discharge Diagnosis: 1.  Subdural hematoma 2.  Myelodysplastic syndrome 3.  Alzheimer's disease 4.  Acute urinary retention 5.  Arrhythmia   Discharge Medications: Allergies as of 03/18/2020      Reactions   Sulfa Antibiotics Other (See Comments)   Patient reports: reaction in past       Medication List    STOP taking these medications   mupirocin ointment 2 % Commonly known as: Bactroban   tranexamic acid 650 MG Tabs tablet Commonly known as: LYSTEDA     TAKE these medications   aminocaproic acid 25 % solution Commonly known as: AMICAR Take 5 mLs (1,250 mg total) by mouth 3 (three) times daily.   donepezil 10 MG tablet Commonly known as: ARICEPT Take 10 mg by mouth daily.   levETIRAcetam 750 MG tablet Commonly known as: KEPPRA Take 1 tablet (750 mg total) by mouth 2 (two) times daily.   levothyroxine 100 MCG tablet Commonly known as: SYNTHROID Take 100 mcg by mouth daily before breakfast.   tamsulosin 0.4 MG Caps capsule Commonly known as: FLOMAX Take 0.4 mg by mouth at bedtime.   triamcinolone lotion 0.1 % Commonly known as: KENALOG Apply 1 application topically 3 (three) times daily.   VITAMIN D3 PO Take 1,000 Units by mouth daily.       Disposition and follow-up:   Mr.Shaun Hobbs was discharged from [facility] in Stable condition.  At the hospital follow up visit please address:  1.   Subdural Hematoma CT head showed small left subdural hematoma.  No surgical intervention per neurosurgery given age and thrombocytopenia.  Started on [place] for epileptogenic city activity on EEG -Follow-up with PCP or neurosurgery if appropriate -Monitor mental status  Myelodysplastic syndrome Dr. Irene Limbo oncology has seen the patient.   Recommended transfuse if Platelet < 20 and Hgb < 7.  Dr. Irene Limbo recommended checking labs and transfuse twice weekly during CIR and weekly after discharge.  They will follow up with him outpatient to determine the next step in the treatment plan.  Acute urinary retention due to BPH His Flomax was held given altered mental status.  Foley catheter was placed for urinary retention and possible bladder stretch injury. -Continue Flomax.  Voiding trial if appropriate -Follow up with Urology  2.  Labs / imaging needed at time of follow-up: CBC, BMP  3.  Pending labs/ test needing follow-up: NA  Follow-up Appointments:  Follow-up Information    Chesley Noon, MD Follow up.   Specialty: Family Medicine Contact information: [address] [address] [phone]        Brunetta Genera, MD. Schedule an appointment as soon as possible for a visit.   Specialties: Hematology, Oncology Contact information: [place] [address] [phone]        [facility]. Schedule an appointment as soon as possible for a visit in 3 week(s).   Contact information: [place] [phone]              HPI Patient is seen at bedside. He appears comfortable with no acute distress. He is oriented to person only. His reaction seems to be slower compared to yesterday.  I had a long discussion with  patient's wife this morning.  She expressed her concern for the lack of improvement in the last few days.  I explained to her this could be from his subdural hematoma that is progressing.  Patient will be going to CIR today for physical therapy which hopefully can help improve his functional status.  Family will need to discuss and think about the next step after of inpatient rehab whether he will be going home with home health or SNF.  Patient is wife understands the plan.   Hospital Course by problem  list: 1.  Subdural hematoma Patient presented to the hospital for acute encephalopathy in the past 3-4 days.  CT head showed small left subdural hematoma.  This is likely due to spontaneous bleeding from thrombocytopenia.  No significant trauma associated.  No surgical interventions per neurosurgery.  EEG showed epileptogenic city of the left hemisphere likely due to the subdural hematoma.  Patient was started on Keppra and continued on EEG monitor.  His mental status slowly improved in the next few days with improvement in orientation and speaking ability.  Palliative was consulted and discussed with family regarding goals of care.  Family would like patient to go to inpatient rehab for physical therapy and then go home with home health PT and outpatient palliative.  2.  Myelodysplastic syndrome His platelets on admission was less than 5 and he received 5 units of platelets and 1 unit of blood since admission.  Per Dr. Irene Limbo, goal platelets > 20 and hemoglobin > 7 due to his subdural hematoma.  Dr. Irene Limbo amended checking labs and transfuse him twice weekly during CIR and weekly after discharge.  Patient will continue follow-up with Dr.Kale after discharge.  3.  Acute urinary retention Patient developed acute urinary retention in the setting of BPH.  His Flomax was held on admission given his mental status.  Foley catheter was placed due to multiple bladder scans and suspected bladder stretch injury.  His Flomax was restarted.  Can attempt voiding trial if appropriate. Follow up with Urology after discharge.   4.  Tachycardia/arrhythmia On day 1, patient developed a brief episode of tachycardia with heart rate in the 130s with EKG showing irregularly irregular rhythm possible for MAT vs aflutter.  Patient self converted back to sinus shortly after.  Patient was asymptomatic at that time.  No more episode of tachycardia until discharge.  Discharge Exam:   BP 109/64 (BP Location: Right Arm)   Pulse 67    Temp 97.7 F (36.5 C) (Oral)   Resp 16   Ht 5\' 10"  (1.778 m)   Wt 71.4 kg   SpO2 98%   BMI 22.59 kg/m  Discharge exam:   Physical Exam Constitutional:      General: He is not in acute distress.    Comments: Only oriented to person.   HENT:     Head: Normocephalic.  Eyes:     General:        Right eye: No discharge.        Left eye: No discharge.  Cardiovascular:     Rate and Rhythm: Normal rate and regular rhythm.  Pulmonary:     Effort: Pulmonary effort is normal. No respiratory distress.  Abdominal:     General: Bowel sounds are normal.  Musculoskeletal:     Right lower leg: No edema.     Left lower leg: No edema.  Neurological:     Mental Status: He is alert.  Psychiatric:  Mood and Affect: Mood normal.     Pertinent Labs, Studies, and Procedures:  CBC Latest Ref Rng & Units 03/17/2020 03/16/2020 03/15/2020  WBC 4.0 - 10.5 K/uL 4.1 3.2(L) 3.5(L)  Hemoglobin 13.0 - 17.0 g/dL 7.6(L) 8.0(L) 7.5(L)  Hematocrit 39.0 - 52.0 % 23.5(L) 23.7(L) 22.5(L)  Platelets 150 - 400 K/uL 52(L) 17(LL) 21(LL)   CMP Latest Ref Rng & Units 03/15/2020 03/14/2020 03/13/2020  Glucose 70 - 99 mg/dL 103(H) 106(H) 109(H)  BUN 8 - 23 mg/dL 15 17 20   Creatinine 0.61 - 1.24 mg/dL 0.82 0.81 0.82  Sodium 135 - 145 mmol/L 132(L) 133(L) 133(L)  Potassium 3.5 - 5.1 mmol/L 3.7 3.7 3.7  Chloride 98 - 111 mmol/L 101 102 104  CO2 22 - 32 mmol/L 21(L) 21(L) 22  Calcium 8.9 - 10.3 mg/dL 8.2(L) 8.2(L) 8.4(L)  Total Protein 6.5 - 8.1 g/dL - - -  Total Bilirubin 0.3 - 1.2 mg/dL - - -  Alkaline Phos 38 - 126 U/L - - -  AST 15 - 41 U/L - - -  ALT 0 - 44 U/L - - -   CT Head Wo Contrast  Result Date: 03/08/2020 IMPRESSION: Small subdural hematoma along the LEFT tentorium, extending onto LEFT falx and anteriorly into the medial aspect of the LEFT middle cranial fossa as well as extending into the LEFT occipital region, measuring up to 4 mm thick. Additional small amount of subdural blood overlying the LEFT  occipital lobe posteriorly. No significant mass effect or midline shift. Atrophy with small vessel chronic ischemic changes of deep cerebral white matter. No acute intraparenchymal abnormalities identified. Critical Value/emergent results were called by telephone at the time of interpretation on 03/08/2020 at 11:08 am to provider DR. ANKIT NANAVATI , who verbally acknowledged these results. Electronically Signed   By: Lavonia Dana M.D.   On: 03/08/2020 11:08   DG Chest Portable 1 View  Result Date: 03/08/2020 CLINICAL DATA:  Altered mental status, fever, history dementia EXAM: PORTABLE CHEST 1 VIEW COMPARISON:  Portable exam 0923 hours compared to 12/12/2018 FINDINGS: Normal heart size, mediastinal contours, and pulmonary vascularity. Atherosclerotic calcification aorta. Lungs clear. No acute infiltrate, pleural effusion, or pneumothorax. Bones demineralized. IMPRESSION: No acute abnormalities. Aortic Atherosclerosis (ICD10-I70.0). Electronically Signed   By: Lavonia Dana M.D.   On: 03/08/2020 09:33   EEG adult  IMPRESSION: This study showed evidence of epileptogenicity arising from left hemisphere due to underlying structural abnormality. Additionally there is moderate diffuse encephalopathy, nonspecific etiology. No seizures were seen throughout the recording. Primary team was notified. Priyanka O Yadav   Overnight EEG with video  IMPRESSION: This study initially showed evidence of epileptogenicity arising from left hemisphere due to underlying structural abnormality. Gradually EEG showed generalized periodic discharges with triphasic morphology when patient was stimulated which is on the ictal-interictal continuum.  Additionally there is moderate diffuse encephalopathy, nonspecific etiology. No definite seizures were seen throughout the recording.  Lora Havens   Discharge Instructions: Discharge Instructions    Call MD for:  difficulty breathing, headache or visual disturbances   Complete by: As  directed    Call MD for:  severe uncontrolled pain   Complete by: As directed    Diet - low sodium heart healthy   Complete by: As directed    Discharge instructions   Complete by: As directed    Mr. Shugars,  It was a pleasure taking care of you during this admission.  You were hospitalized for confusion due to bleeding around your  brain.  This is likely due to your low platelet count.  You will be going to inpatient rehab for physical therapy.  Please continue taking Keppra, antiepileptic drug, as instructed.  Please follow-up with Dr. Irene Limbo for your myelodysplastic syndrome.  Take care   Increase activity slowly   Complete by: As directed       Signed: Gaylan Gerold, DO 03/18/2020, 10:17 AM   Pager: [phone]

## 2020-03-18 NOTE — Progress Notes (Signed)
Jamse Arn, MD  Physician  Physical Medicine and Rehabilitation  PMR Pre-admission     Addendum  Date of Service:  03/18/2020 10:36 AM      Related encounter: ED to Hosp-Admission (Discharged) from 03/08/2020 in [facility] [place]           Show:Clear all [x] Manual[x] Template[x] Copied  Added by: [x] Cristina Gong, RN[x] Jamse Arn, MD   [] Hover for details  PMR Admission Coordinator Pre-Admission Assessment   Patient: Shaun Hobbs is an 87 y.o., male MRN: [id number] DOB: 12-May-1928 Height: 5\' 10"  (177.8 cm) Weight: 71.4 kg                                                                                                                                                  Insurance Information HMO:     PPO:      PCP:      IPA:      80/20:      OTHER:  PRIMARY: Medicare a and b      Policy#: [id number]      Subscriber: pt Benefits:  Phone #: passport one online     Name: 2/8 Eff. Date: a 12/09/1995 an db 01/08/1996     Deduct: $1556      Out of Pocket Max: none      Life Max: none  CIR: 100%      SNF: 20 full days Outpatient: 80%     Co-Pay: 20% Home Health: 100%      Co-Pay: none DME: 80%     Co-Pay: 20% Providers: pt choice  SECONDARY: Tricare for Life      Policy#: [id number]   Financial Counselor:       Phone#:    The "Data Collection Information Summary" for patients in Inpatient Rehabilitation Facilities with attached "Privacy Act [place] Records" was provided and verbally reviewed with: Family   Emergency Contact Information Contact Information       Name Relation Home Work Mobile    Demitrius, Crass Spouse     [phone]    Elyse Jarvis Daughter [phone]   [phone]    Ngai, Parcell [phone]         Current Medical History  Patient Admitting Diagnosis: SDH   History of Present Illness::  87 year old right-handed male with history of Covid 2021, myelodysplastic syndrome  followed by Dr. Irene Limbo, Alzheimer's disease maintained on Aricept, hypothyroidism.  Per chart review lives with spouse.  Independent prior to admission with decreased safety awareness.  Presented 03/08/2020 with altered mental status and generalized weakness.  Wife had noted generalized decline over the past week.  Cranial CT scan showed small subdural hematoma along the left tentorium extending onto left falx and anteriorly into the medial aspect of the left middle cranial fossa as well as extending into the left occipital  region measuring up to 4 mm thickness.  Additional small amount of subdural blood overlying the left occipital lobe posteriorly.  No mass-effect or midline shift.  Admission chemistries lactic acid 0.9 sodium 128 glucose 122 hemoglobin 7.4 platelets less than 5, blood cultures no growth to date, urinalysis negative nitrite.  Neurosurgery followed Dr. Venetia Constable with conservative care.  EEG negative for seizure during recording.  There was moderate diffuse encephalopathy.  Maintained on Keppra for seizure prophylaxis.  Dysphagia #1 thin liquid diet.  Bouts of urinary retention with Foley catheter tube placed patient remains on Flomax planning voiding trial.  Palliative care consulted to establish goals of care . Past Medical History      Past Medical History:  Diagnosis Date  . Anemia    . Cancer ([place]) 2008    Myelodysplastic syndrome  . Dementia ([place])    . Hypothyroidism        Family History  family history is not on file.   Prior Rehab/Hospitalizations:  Has the patient had prior rehab or hospitalizations prior to admission? Yes   Has the patient had major surgery during 100 days prior to admission? No   Current Medications    Current Facility-Administered Medications:  .  0.9 %  sodium chloride infusion (Manually program via Guardrails IV Fluids), , Intravenous, Once, Jeralyn Bennett, MD .  0.9 %  sodium chloride infusion (Manually program via Guardrails IV Fluids), ,  Intravenous, Once, Jeralyn Bennett, MD .  0.9 %  sodium chloride infusion, 10 mL/hr, Intravenous, Once, Nanavati, Ankit, MD, Last Rate: 10 mL/hr at 03/11/20 2220, 10 mL/hr at 03/11/20 2220 .  acetaminophen (TYLENOL) tablet 650 mg, 650 mg, Oral, Q6H PRN, 650 mg at 03/13/20 1643 **OR** acetaminophen (TYLENOL) suppository 650 mg, 650 mg, Rectal, Q6H PRN, Maudie Mercury, MD, 650 mg at 03/09/20 2009 .  aminocaproic acid (AMICAR) 25 % solution 1,250 mg, 1,250 mg, Oral, TID, Gaylan Gerold, DO, 1,250 mg at 03/17/20 2220 .  chlorhexidine (PERIDEX) 0.12 % solution 15 mL, 15 mL, Mouth Rinse, BID, Dareen Piano, Nischal, MD, 15 mL at 03/17/20 2219 .  Chlorhexidine Gluconate Cloth 2 % PADS 6 each, 6 each, Topical, Daily, Axel Filler, MD, 6 each at 03/17/20 1655 .  donepezil (ARICEPT) tablet 10 mg, 10 mg, Oral, Daily, Gaylan Gerold, DO, 10 mg at 03/17/20 1002 .  levETIRAcetam (KEPPRA) tablet 750 mg, 750 mg, Oral, BID, Aslam, Sadia, MD, 750 mg at 03/17/20 2220 .  levothyroxine (SYNTHROID) tablet 100 mcg, 100 mcg, Oral, QAC breakfast, Gaylan Gerold, DO, 100 mcg at 03/18/20 [phone] .  MEDLINE mouth rinse, 15 mL, Mouth Rinse, BID, Lucious Groves, DO, 15 mL at 03/17/20 2220 .  polyethylene glycol (MIRALAX / GLYCOLAX) packet 17 g, 17 g, Oral, Daily PRN, Maudie Mercury, MD, 17 g at 03/14/20 0917 .  tamsulosin (FLOMAX) capsule 0.4 mg, 0.4 mg, Oral, QHS, Gaylan Gerold, DO, 0.4 mg at 03/17/20 2220   Patients Current Diet:  Diet Order                  Diet - low sodium heart healthy             DIET - DYS 1 Room service appropriate? Yes; Fluid consistency: Thin  Diet effective now                         Precautions / Restrictions Precautions Precautions: Fall Precaution Comments: incontinent of stool Restrictions Weight Bearing Restrictions: No  Has the patient had 2 or more falls or a fall with injury in the past year?No   Prior Activity Level Limited Community (1-2x/wk): supervison with RW at  home due to cognition   Prior Functional Level Prior Function Level of Independence: Independent Gait / Transfers Assistance Needed: supervision with RW ADL's / Homemaking Assistance Needed: supervision   Self Care: Did the patient need help bathing, dressing, using the toilet or eating?  Needed some help   Indoor Mobility: Did the patient need assistance with walking from room to room (with or without device)? Independent   Stairs: Did the patient need assistance with internal or external stairs (with or without device)? Independent   Functional Cognition: Did the patient need help planning regular tasks such as shopping or remembering to take medications? Needed some help   Home Assistive Devices / [place] Devices/Equipment: Shower chair with back Home Equipment: Walker - 2 wheels,Grab bars - toilet,Grab bars - tub/shower,Other (comment),Shower seat (walking stick)   Prior Device Use: Indicate devices/aids used by the patient prior to current illness, exacerbation or injury? Walker   Current Functional Level Cognition   Overall Cognitive Status: History of cognitive impairments - at baseline Current Attention Level: Focused Orientation Level: Oriented to person Following Commands: Follows one step commands with increased time Safety/Judgement: Decreased awareness of safety,Decreased awareness of deficits General Comments: pt following commands with increased time needing tactile cues at times, flat throughout session, no verbalizations.    Extremity Assessment (includes Sensation/Coordination)   Upper Extremity Assessment: Generalized weakness  Lower Extremity Assessment: Generalized weakness     ADLs   Overall ADL's : Needs assistance/impaired Grooming: Oral care,Brushing hair,Minimal assistance,Sitting,Cueing for sequencing Grooming Details (indicate cue type and reason): able to initiate tasks with visual demo-not responding verbally but follows gestural  cues Upper Body Dressing : Minimal assistance,Sitting Upper Body Dressing Details (indicate cue type and reason): Donned anterior hospital gown. Lower Body Dressing: Minimal assistance,Sit to/from stand Lower Body Dressing Details (indicate cue type and reason): Able to doff/don footwear seated EOB with Min gaurd and more than increased time. Toilet Transfer: Minimal assistance,RW,Stand-pivot,Cueing for sequencing Toilet Transfer Details (indicate cue type and reason): simulated from eob to recliner transfer [place] and Hygiene: Moderate assistance Toileting - Clothing Manipulation Details (indicate cue type and reason): Patient incontinent of bowel prior to toiet transfer but seemingly unaware. Mod A for hygiene/clothing management. Functional mobility during ADLs: Minimal assistance,Rolling walker,Cueing for sequencing General ADL Comments: pt continues to present with cognitive deficits, impaired balance and decreased activity tolerance     Mobility   Overal bed mobility: Needs Assistance Bed Mobility: Supine to Sit Rolling: Min assist Sidelying to sit: Min assist Supine to sit: HOB elevated,Mod assist Sit to supine: Supervision General bed mobility comments: Up in chair upon PT arrival.     Transfers   Overall transfer level: Needs assistance Equipment used: Rolling walker (2 wheeled) Transfers: Sit to/from Stand Sit to Stand: Min assist,Min guard Stand pivot transfers: Min assist General transfer comment: Min A to power to standing with manual assist for hand placement. Stood from chair x1, from [employer]. SPT bed to chair with Min A and cues.     Ambulation / Gait / Stairs / Wheelchair Mobility   Ambulation/Gait Ambulation/Gait assistance: Herbalist (Feet): 150 Feet Assistive device: Rolling walker (2 wheeled) Gait Pattern/deviations: Step-through pattern,Trunk flexed,Decreased stride length General Gait Details: Slow, mildly unsteady gait  with tendency to drift towards right. Min A  for balance and RW management. Gait velocity: reduced Gait velocity interpretation: <1.8 ft/sec, indicate of risk for recurrent falls     Posture / Balance Dynamic Sitting Balance Sitting balance - Comments: able to sit EOB with minguard with no LOB Balance Overall balance assessment: Needs assistance Sitting-balance support: No upper extremity supported,Feet supported Sitting balance-Leahy Scale: Fair Sitting balance - Comments: able to sit EOB with minguard with no LOB Standing balance support: During functional activity Standing balance-Leahy Scale: Poor Standing balance comment: reliant on BUE support     Special needs/care consideration Designated visitor is wife, [name]. Daughter, [name] , needs all plans of care communicated to her for she manages patient's plan of care and wife needs her assistance for planning Palliative following for goals of care and follow up after d/c Patient was to d/c to [facility] on 2/8 due to bed limitations at [facility] CIR; bed offer withdrawn on 2/8 by [place] [facility] MD    Previous Home Environment  Living Arrangements: Spouse/significant other  Lives With: Spouse Available Help at Discharge: Available 24 hours/day Type of Home: [place]: One level Home Access: Stairs to enter [employer] of Steps: 1 Bathroom Shower/Tub: Art therapist: Programmer, systems: Yes Home Care Services: No Additional Comments: pt with baseline dementia; wife states pt can get up and dressed and walk around house without assist. S needed when out of house. wife performs Nurse, learning disability and medicine management   Discharge Living Setting Plans for Discharge Living Setting: Patient's home,Lives with (comment) (spouse) Type of Home at Discharge: House Discharge Home Layout: One level Discharge Home Access: Stairs to enter Entrance Stairs-Rails: None Entrance  Stairs-Number of Steps: 1 Discharge Bathroom Shower/Tub: Tub/shower unit,Walk-in shower Discharge Bathroom Toilet: Standard Discharge Bathroom Accessibility: Yes How Accessible: Accessible via walker Does the patient have any problems obtaining your medications?: No   Social/Family/Support Systems Contact Information: wife will be visitor but all communication but all communication needs to be addressed with daughter, [name] Anticipated Caregiver: wife, family and hired caregivers Anticipated Ambulance person Information: [name] cell [phone] Ability/Limitations of Caregiver: wife [place] and therfore Kitty needs all communications Caregiver Availability: 24/7 Discharge Plan Discussed with Primary Caregiver: Yes Is Caregiver In Agreement with Plan?: Yes Does Caregiver/Family have Issues with Lodging/Transportation while Pt is in Rehab?: Yes   Goals Patient/Family Goal for Rehab: supervision to min asisst with PT, OT, and SLP Expected length of stay: 10-14 days. Additional Information: baseline dementia; alert; pleasant Pt/Family Agrees to Admission and willing to participate: Yes Program Orientation Provided & Reviewed with Pt/Caregiver Including Roles  & Responsibilities: Yes   Decrease burden of Care through IP rehab admission: n/a   Possible need for SNF placement upon discharge:Family will not go to SNF. They plan to hire assist at home around 12 hours per day as they have done in the past.   Patient Condition: This patient's medical and functional status has changed since the consult dated: 03/12/2020 in which the Rehabilitation Physician determined and documented that the patient's condition is appropriate for intensive rehabilitative care in an inpatient rehabilitation facility. See "History of Present Illness" (above) for medical update. Functional changes are: min assist with mod verbal cues. Patient's medical and functional status update has been discussed with the Rehabilitation  physician and patient remains appropriate for inpatient rehabilitation. Will admit to inpatient rehab today.   Preadmission Screen Completed By:  Cleatrice Burke, RN, 03/18/2020 10:36 AM ______________________________________________________________________   Discussed status with Dr. Posey Pronto  on 03/18/2020 at  1042 and received approval for admission today.   Admission Coordinator:  Cleatrice Burke, time 0097 Date 03/18/2020           Revision History                                  Note Details  Author Jamse Arn, MD File Time 03/18/2020 11:27 AM  Author Type Physician Status Addendum  Last Editor Jamse Arn, MD Service Physical Medicine and [place] # [id number] Admit Date 03/18/2020

## 2020-03-19 DIAGNOSIS — S065X9A Traumatic subdural hemorrhage with loss of consciousness of unspecified duration, initial encounter: Secondary | ICD-10-CM | POA: Diagnosis not present

## 2020-03-19 LAB — CBC WITH DIFFERENTIAL/PLATELET
Abs Immature Granulocytes: 0 10*3/uL (ref 0.00–0.07)
Basophils Absolute: 0 10*3/uL (ref 0.0–0.1)
Basophils Relative: 0 %
Eosinophils Absolute: 0 10*3/uL (ref 0.0–0.5)
Eosinophils Relative: 0 %
HCT: 23.4 % — ABNORMAL LOW (ref 39.0–52.0)
Hemoglobin: 7.9 g/dL — ABNORMAL LOW (ref 13.0–17.0)
Immature Granulocytes: 0 %
Lymphocytes Relative: 27 %
Lymphs Abs: 1 10*3/uL (ref 0.7–4.0)
MCH: 32.1 pg (ref 26.0–34.0)
MCHC: 33.8 g/dL (ref 30.0–36.0)
MCV: 95.1 fL (ref 80.0–100.0)
Monocytes Absolute: 0.4 10*3/uL (ref 0.1–1.0)
Monocytes Relative: 10 %
Neutro Abs: 2.2 10*3/uL (ref 1.7–7.7)
Neutrophils Relative %: 63 %
Platelets: 34 10*3/uL — ABNORMAL LOW (ref 150–400)
RBC: 2.46 MIL/uL — ABNORMAL LOW (ref 4.22–5.81)
RDW: 17.3 % — ABNORMAL HIGH (ref 11.5–15.5)
WBC: 3.6 10*3/uL — ABNORMAL LOW (ref 4.0–10.5)
nRBC: 0 % (ref 0.0–0.2)

## 2020-03-19 LAB — COMPREHENSIVE METABOLIC PANEL
ALT: 10 U/L (ref 0–44)
AST: 15 U/L (ref 15–41)
Albumin: 3.1 g/dL — ABNORMAL LOW (ref 3.5–5.0)
Alkaline Phosphatase: 54 U/L (ref 38–126)
Anion gap: 8 (ref 5–15)
BUN: 17 mg/dL (ref 8–23)
CO2: 24 mmol/L (ref 22–32)
Calcium: 8.5 mg/dL — ABNORMAL LOW (ref 8.9–10.3)
Chloride: 102 mmol/L (ref 98–111)
Creatinine, Ser: 0.92 mg/dL (ref 0.61–1.24)
GFR, Estimated: >60 mL/min (ref >60)
Glucose, Bld: 103 mg/dL — ABNORMAL HIGH (ref 70–99)
Potassium: 4 mmol/L (ref 3.5–5.1)
Sodium: 134 mmol/L — ABNORMAL LOW (ref 135–145)
Total Bilirubin: 0.8 mg/dL (ref 0.3–1.2)
Total Protein: 6.2 g/dL — ABNORMAL LOW (ref 6.5–8.1)

## 2020-03-19 MED ORDER — LEVETIRACETAM 500 MG PO TABS
500.0000 mg | ORAL_TABLET | Freq: Two times a day (BID) | ORAL | Status: DC
  Administered 2020-03-19 – 2020-03-20 (×2): 500 mg via ORAL
  Filled 2020-03-19 (×2): qty 1

## 2020-03-19 NOTE — Discharge Instructions (Signed)
Inpatient Rehab Discharge Instructions  [place] Discharge date and time: No discharge date for patient encounter.   Activities/Precautions/ Functional Status: Activity: activity as tolerated Diet:  Wound Care: Routine skin checks Functional status:  ___ No restrictions     ___ Walk up steps independently ___ 24/7 supervision/assistance   ___ Walk up steps with assistance ___ Intermittent supervision/assistance  ___ Bathe/dress independently ___ Walk with walker     _x__ Bathe/dress with assistance ___ Walk Independently    ___ Shower independently ___ Walk with assistance    ___ Shower with assistance ___ No alcohol     ___ Return to work/school ________  Special Instructions:  No driving smoking or alcohol  My questions have been answered and I understand these instructions. I will adhere to these goals and the provided educational materials after my discharge from the hospital.  Patient/Caregiver Signature _______________________________ Date __________  Clinician Signature _______________________________________ Date __________  Please bring this form and your medication list with you to all your follow-up doctor's appointments.

## 2020-03-19 NOTE — Progress Notes (Signed)
[place] PHYSICAL MEDICINE & REHABILITATION PROGRESS NOTE   Subjective/Complaints: PC down to 32, discussed with wife, will transfuse if <20 Hgb up to 7.9 Tolerating OT with Jeneen Rinks Wife says palliative care involvement has been great.   ROS: Denies pain  Objective:   No results found. Recent Labs    03/17/20 0034 03/19/20 0518  WBC 4.1 3.6*  HGB 7.6* 7.9*  HCT 23.5* 23.4*  PLT 52* 34*   Recent Labs    03/19/20 0518  NA 134*  K 4.0  CL 102  CO2 24  GLUCOSE 103*  BUN 17  CREATININE 0.92  CALCIUM 8.5*    Intake/Output Summary (Last 24 hours) at 03/19/2020 1093 Last data filed at 03/19/2020 0457 Gross per 24 hour  Intake 120 ml  Output 1150 ml  Net -1030 ml        Physical Exam: Vital Signs Blood pressure 115/62, pulse 68, temperature 98.5 F (36.9 C), resp. rate 18, height 5\' 10"  (1.778 m), weight 69 kg, SpO2 95 %. Gen: no distress, normal appearing HEENT: oral mucosa pink and moist, NCAT Cardio: Reg rate Chest: normal effort, normal rate of breathing Abd: soft, non-distended Ext: no edema Psych: pleasant, normal affect Skin: intact Genitourinary:    Comments: Indwelling Foley tube in place Musculoskeletal:     Cervical back: Normal range of motion and neck supple.     Comments: No edema or tenderness in extremities  Skin:    General: Skin is warm and dry.  Neurological:     Mental Status: He is alert.     Comments: Alert-mostly stare Oriented to first name only Limited engagement and following commands, however moving all extremities spontaneously  Psychiatric:     Comments: Limited due to dementia    Assessment/Plan: 1. Functional deficits which require 3+ hours per day of interdisciplinary therapy in a comprehensive inpatient rehab setting.  Physiatrist is providing close team supervision and 24 hour management of active medical problems listed below.  Physiatrist and rehab team continue to assess barriers to discharge/monitor patient  progress toward functional and medical goals  Care Tool:  Bathing              Bathing assist       Upper Body Dressing/Undressing Upper body dressing   What is the patient wearing?: Hospital gown only    Upper body assist Assist Level: Maximal Assistance - Patient 25 - 49%    Lower Body Dressing/Undressing Lower body dressing      What is the patient wearing?: Incontinence brief     Lower body assist Assist for lower body dressing: Maximal Assistance - Patient 25 - 49%     Toileting Toileting    Toileting assist Assist for toileting: Total Assistance - Patient < 25% (foley)     Transfers Chair/bed transfer  Transfers assist           Locomotion Ambulation   Ambulation assist              Walk 10 feet activity   Assist           Walk 50 feet activity   Assist           Walk 150 feet activity   Assist           Walk 10 feet on uneven surface  activity   Assist           Wheelchair     Assist  Wheelchair 50 feet with 2 turns activity    Assist            Wheelchair 150 feet activity     Assist          Blood pressure 115/62, pulse 68, temperature 98.5 F (36.9 C), resp. rate 18, height 5\' 10"  (1.778 m), weight 69 kg, SpO2 95 %.    Medical Problem List and Plan: 1.  Progressive weakness with altered mental status secondary to nontraumatic left SDH             -patient may shower             -ELOS/Goals: 10-14 days/supervision/min a             Initial CIR evaluations today.  2.  Antithrombotics: -DVT/anticoagulation: SCDs             -antiplatelet therapy: N/A 3. Pain Management: Tylenol as needed 4. Mood: Aricept 10 mg daily             -antipsychotic agents: N/A 5. Neuropsych: This patient is not capable of making decisions on his own behalf. 6. Skin/Wound Care: Routine skin checks 7. Fluids/Electrolytes/Nutrition: Routine in and outs             CMP reviewed,  significant for hypoglycemia on 2/10.  8.  Dysphagia.  Dysphagia #1 thin liquid diet             Advance diet as tolerated  Discussed incorporating high protein foods as much as possible.  9.  Seizure prophylaxis. It has been 11 days since SDH, wean Keppra to 500mg  BID 10.  Myelodysplastic syndrome/thrombocytopenia.  Latest platelet count 52,000.  Follow-up outpatient Dr. Irene Limbo.  Continue Amicar 1250 mg 3 times daily             2/10: PC reviewed, down to 32. Will transfuse if <20. Will check twice per week. Repeat tomorrow.  11. Hypothyroidism: Synthroid 12.  BPH/urinary retention.  Foley d/ced. Flomax 0.4 mg nightly.               Voiding trial 13. Anemia: Hgb reviewed, up to 7.9 on 2.10  LOS: 1 days A FACE TO FACE EVALUATION WAS PERFORMED  Clide Deutscher Kallen Mccrystal 03/19/2020, 6:21 AM

## 2020-03-19 NOTE — Progress Notes (Signed)
Inpatient Rehabilitation Center Individual Statement of Services  Patient Name:  Shaun Hobbs  Date:  03/19/2020  Welcome to the [facility].  Our goal is to provide you with an individualized program based on your diagnosis and situation, designed to meet your specific needs.  With this comprehensive rehabilitation program, you will be expected to participate in at least 3 hours of rehabilitation therapies Monday-Friday, with modified therapy programming on the weekends.  Your rehabilitation program will include the following services:  Physical Therapy (PT), Occupational Therapy (OT), Speech Therapy (ST), 24 hour per day rehabilitation nursing, Therapeutic Recreaction (TR), Neuropsychology, Care Coordinator, Rehabilitation Medicine, Nutrition Services, Pharmacy Services and Other  Weekly team conferences will be held on Wednesday to discuss your progress.  Your Inpatient Rehabilitation Care Coordinator will talk with you frequently to get your input and to update you on team discussions.  Team conferences with you and your family in attendance may also be held.  Expected length of stay: 10-14 Days  Overall anticipated outcome: Supervision to Min A  Depending on your progress and recovery, your program may change. Your Inpatient Rehabilitation Care Coordinator will coordinate services and will keep you informed of any changes. Your Inpatient Rehabilitation Care Coordinator's name and contact numbers are listed  below.  The following services may also be recommended but are not provided by the [facility]:    [place] will be made to provide these services after discharge if needed.  Arrangements include referral to agencies that provide these services.  Your insurance has been verified to be:  Medicare A & B Your primary doctor is:  Anastasia Pall, MD  Pertinent  information will be shared with your doctor and your insurance company.  Inpatient Rehabilitation Care Coordinator:  Erlene Quan, [place] or [phone]  Information discussed with and copy given to patient by: Dyanne Iha, 03/19/2020, 10:33 AM

## 2020-03-19 NOTE — Evaluation (Signed)
Speech Language Pathology Assessment and Plan  Patient Details  Name: Shaun Hobbs MRN: [id number] Date of Birth: 01-04-1929  SLP Diagnosis: Speech and Language deficits;Cognitive Impairments;Dysphagia  Rehab Potential: Good ELOS: 1.5-2 weeks    Today's Date: 03/19/2020 SLP Individual Time: 1400-1456 SLP Individual Time Calculation (min): 56 min   Hospital Problem: Principal Problem:   Subdural hematoma ([place])  Past Medical History:  Past Medical History:  Diagnosis Date  . Anemia   . Cancer ([place]) 2008   Myelodysplastic syndrome  . Dementia ([place])   . Hypothyroidism    Past Surgical History: History reviewed. No pertinent surgical history.  Assessment / Plan / Recommendation Clinical Impression   HPI: Shaun Hobbs is an 87 year old right-handed male with history of Covid 2021, myelodysplastic syndrome followed by Dr. Irene Limbo, Alzheimer's disease maintained on Aricept, hypothyroidism. History taken from chart review and wife. Patient lives with spouse.  Independent prior to admission with decreased safety awareness. He presented on 03/08/2020 with AMS and generalized weakness. Wife had noted generalized decline over the past week. Head CT scan showed small SDH along left tentorium extending onto left falx and anteriorly into the medial aspect of the left middle cranial fossa as well as extending into the left occipital region measuring up to 4 mm thickness.  Additional small amount of subdural blood overlying the left occipital lobe posteriorly.  No mass-effect or midline shift.  Admission chemistries lactic acid 0.9, sodium 128, glucose 122 hemoglobin 7.4, platelets less than 5, blood cultures no growth to date, urinalysis negative nitrite.  Neurosurgery consulted and recommended conservative care. EEG negative for seizures. There was moderate diffuse encephalopathy.  Maintained on Keppra for seizure prophylaxis. Hospital course further complicated by dysphagia and started on dysphagia #1,  thin liquid diet.  Bouts of urinary retention with Foley catheter tube placed patient remains on Flomax planning voiding trial.  Palliative care consulted to establish goals of care.  Therapy evaluations completed due to patient's altered mental status and decreased functional mobility was admitted for a comprehensive rehab program at [facility] 03/18/20 and SLP evaluations were administered 03/19/20 with results as follows:  Pt presents with s/sx of primarily oral dysphagia exacerbated by his cognitive status. Pt consumed sips of thin H2O and puree texture solids with timely transit and no immediate overt s/sx aspiration. Mastication of Dys 2 (minced/ground) textures, also appeared functional - his mastication was timely and efficient and he achieved full oral clearance with Supervision A for use of alternating solids of liquids strategy. Dys 3 solids (mechanical soft) resulted in somewhat extended mastication, more lingual residue, and one instance of poor awareness of bolus and oral holding, however he was receptive to cues. A delayed cough was noted at end of intake, making it unclear if it was tied to and PO intake. Recommend pt upgrade to dysphagia 2 solids (minced/ground), continue thin liquids, medications crushed in puree. Pt will require full supervision during meals to ensure use of swallow strategies, initiation, and may need intermittent assistance with self feeding.   Pt's wife present and able to provide extensive information regarding baseline cognitive-linguistic status. Although pt with hx of baseline Alzheimer's Dementia, his primary cognitive deficits were relatively mild and related to long and short term memory. His deficits are now exacerbated in the setting of acute CVA, and she notes those related to orientation, problem solving, attention, and initiation are new. Pt was only oriented to self, and requires Mod-Max A for functional problem solving, recall, and attention to tasks. Pt was  minimally  verbal throughout sessio, which will warrant further diagnostic treatment of expressive language abilities. However, SLP suspects that pt's primary communication deficits are more related to cognition as opposed to aphasia. SLP will continue ongoing assessment. In conversation with SLP, he mostly communicated at the phrase and sentence level, and all utterances were intelligible.  Recommend pt receive skilled ST services to address dysphagia and cognitive communication deficits in order to ensure diet safety and efficiency, as well as to maximize his functional communication, independence, and safety prior to discharge home. Anticipate pt will need 24/7 supervision and follow up ST services at discharge.    Skilled Therapeutic Interventions          Cognitive-linguistic and bedside swallow evaluations were administered and results were reviewed with pt (please see above for details regarding the results).   SLP Assessment  Patient will need skilled Speech Lanaguage Pathology Services during CIR admission    Recommendations  SLP Diet Recommendations: Dysphagia 2 (Fine chop);Thin Liquid Administration via: Cup;Straw Medication Administration: Crushed with puree Supervision: Staff to assist with self feeding;Trained caregiver to feed patient;Full supervision/cueing for compensatory strategies Compensations: Minimize environmental distractions;Slow rate;Small sips/bites;Follow solids with liquid Postural Changes and/or Swallow Maneuvers: Seated upright 90 degrees Oral Care Recommendations: Oral care BID Patient destination: Home Follow up Recommendations: Home Health SLP;24 hour supervision/assistance;Outpatient SLP Equipment Recommended: None recommended by SLP    SLP Frequency 3 to 5 out of 7 days   SLP Duration  SLP Intensity  SLP Treatment/Interventions 1.5-2 weeks  Minumum of 1-2 x/day, 30 to 90 minutes  Cognitive remediation/compensation;Cueing hierarchy;Dysphagia/aspiration  precaution training;Functional tasks;Patient/family education;Therapeutic Activities;Speech/Language facilitation;Internal/external aids;Environmental controls    Pain Pain Assessment Pain Scale: 0-10 Pain Score: 0-No pain     SLP Evaluation Cognition Overall Cognitive Status: History of cognitive impairments - at baseline Arousal/Alertness: Awake/alert Orientation Level: Oriented to person;Disoriented to situation;Disoriented to time;Disoriented to place Attention: Sustained Sustained Attention: Impaired Sustained Attention Impairment: Verbal basic;Functional basic Memory: Impaired Memory Impairment: Decreased short term memory;Decreased long term memory;Decreased recall of new information Decreased Long Term Memory: Verbal basic;Functional basic Decreased Short Term Memory: Functional basic Awareness: Impaired Awareness Impairment: Emergent impairment Problem Solving: Impaired Problem Solving Impairment: Functional basic Executive Function:  (all impaired due to lower level deficits) Safety/Judgment: Impaired  Comprehension Auditory Comprehension Overall Auditory Comprehension: Appears within functional limits for tasks assessed Conversation: Simple Visual Recognition/Discrimination Discrimination: Not tested Reading Comprehension Reading Status: Not tested Expression Expression Primary Mode of Expression: Verbal Verbal Expression Overall Verbal Expression: Impaired Initiation: Impaired Level of Generative/Spontaneous Verbalization: Sentence Non-Verbal Means of Communication: Not applicable Written Expression Dominant Hand: Right Written Expression: Not tested Oral Motor Oral Motor/Sensory Function Overall Oral Motor/Sensory Function: Generalized oral weakness Facial ROM: Within Functional Limits Facial Symmetry: Within Functional Limits Lingual ROM: Within Functional Limits Lingual Symmetry: Within Functional Limits Lingual Strength: Reduced Velum: Within  Functional Limits Mandible: Within Functional Limits Motor Speech Overall Motor Speech: Appears within functional limits for tasks assessed Phonation: Low vocal intensity Intelligibility: Intelligible Motor Speech Errors: Not applicable  Care Tool Care Tool Cognition Expression of Ideas and Wants Expression of Ideas and Wants: Frequent difficulty - frequently exhibits difficulty with expressing needs and ideas   Understanding Verbal and Non-Verbal Content Understanding Verbal and Non-Verbal Content: Sometimes understands - understands only basic conversations or simple, direct phrases. Frequently requires cues to understand   Memory/Recall Ability *first 3 days only Memory/Recall Ability *first 3 days only: None of the above were recalled     Intelligibility: Intelligible  Bedside Swallowing Assessment General Date of Onset: 03/08/20 Previous Swallow Assessment: BSE 03/10/20 Diet Prior to this Study: Dysphagia 1 (puree);Thin liquids Temperature Spikes Noted: No Respiratory Status: Room air History of Recent Intubation: No Behavior/Cognition: Alert;Cooperative;Pleasant mood;Requires cueing Oral Cavity - Dentition: Adequate natural dentition Self-Feeding Abilities: Needs assist Vision: Functional for self-feeding Patient Positioning: Upright in bed Baseline Vocal Quality: Low vocal intensity Volitional Cough: Weak Volitional Swallow: Able to elicit  Oral Care Assessment Does patient have any of the following "high(er) risk" factors?: None of the above Does patient have any of the following "at risk" factors?: Other - dysphagia Patient is AT RISK: Order set for Adult Oral Care Protocol initiated -  "At Risk Patients" option selected (see row information) Patient is LOW RISK: Follow universal precautions (see row information) Ice Chips Ice chips: Not tested Thin Liquid Thin Liquid: Impaired Presentation: Cup;Straw Pharyngeal  Phase Impairments: Cough - Delayed Nectar  Thick Nectar Thick Liquid: Not tested Honey Thick Honey Thick Liquid: Not tested Puree Puree: Within functional limits Presentation: Spoon;Self Fed Solid Solid: Impaired Presentation: Self Fed;Spoon Oral Phase Impairments: Impaired mastication;Reduced lingual movement/coordination Oral Phase Functional Implications: Prolonged oral transit;Oral holding Pharyngeal Phase Impairments: Cough - Delayed BSE Assessment Suspected Esophageal Findings Suspected Esophageal Findings: Belching Risk for Aspiration Impact on safety and function: Mild aspiration risk Other Related Risk Factors: Deconditioning;Cognitive impairment  Short Term Goals: Week 1: SLP Short Term Goal 1 (Week 1): Pt will demonstrate efficient mastication and oral clearance and minimal overt s/sx aspiration of Dys 3 textures X2 prior to advancement. SLP Short Term Goal 2 (Week 1): Pt will sustain attention to tasks in 3-5 minute intervals with Mod A cues for redirection. SLP Short Term Goal 3 (Week 1): Pt will orient to place and month with Mod A multimodal cues. SLP Short Term Goal 4 (Week 1): Pt will demonstrate ability to use external aids or other strategies to recall basic daily information with Max A cues. SLP Short Term Goal 5 (Week 1): Pt will demonstrate ability to problem solve basic familiar situations with Mod A multimodal cues. SLP Short Term Goal 6 (Week 1): Pt will demonstate ability to express basic needs and participate in naming tasks with overall Mod A for verbal initiation and/or word finding.  Refer to Care Plan for Long Term Goals  Recommendations for other services: None   Discharge Criteria: Patient will be discharged from SLP if patient refuses treatment 3 consecutive times without medical reason, if treatment goals not met, if there is a change in medical status, if patient makes no progress towards goals or if patient is discharged from hospital.  The above assessment, treatment plan, treatment  alternatives and goals were discussed and mutually agreed upon: by patient and his wife  Arbutus Leas 03/19/2020, 3:08 PM

## 2020-03-19 NOTE — Evaluation (Signed)
Physical Therapy Assessment and Plan  Patient Details  Name: Shaun Hobbs MRN: [id number] Date of Birth: Jun 12, 1928  PT Diagnosis: Abnormal posture, Abnormality of gait, Cognitive deficits, Difficulty walking, Impaired cognition and Muscle weakness Rehab Potential: Good ELOS: ~1.5-2weeks   Today's Date: 03/19/2020 PT Individual Time: 1052-1206 PT Individual Time Calculation (min): 74 min    Hospital Problem: Principal Problem:   Subdural hematoma ([place])   Past Medical History:  Past Medical History:  Diagnosis Date  . Anemia   . Cancer ([place]) 2008   Myelodysplastic syndrome  . Dementia ([place])   . Hypothyroidism    Past Surgical History: History reviewed. No pertinent surgical history.  Assessment & Plan Clinical Impression: Patient is a 87 y.o. year old right-handed male with history of Covid 2021, myelodysplastic syndrome followed by Dr. Irene Limbo, Alzheimer's disease maintained on Aricept, hypothyroidism. History taken from chart review and wife. Patient lives with spouse.  Independent prior to admission with decreased safety awareness. He presented on 03/08/2020 with AMS and generalized weakness. Wife had noted generalized decline over the past week. Head CT scan showed small SDH along left tentorium extending onto left falx and anteriorly into the medial aspect of the left middle cranial fossa as well as extending into the left occipital region measuring up to 4 mm thickness.  Additional small amount of subdural blood overlying the left occipital lobe posteriorly.  No mass-effect or midline shift.  Admission chemistries lactic acid 0.9, sodium 128, glucose 122 hemoglobin 7.4, platelets less than 5, blood cultures no growth to date, urinalysis negative nitrite.  Neurosurgery consulted and recommended conservative care. EEG negative for seizures. There was moderate diffuse encephalopathy.  Maintained on Keppra for seizure prophylaxis. Hospital course further complicated by dysphagia and  started on dysphagia #1, thin liquid diet.  Bouts of urinary retention with Foley catheter tube placed patient remains on Flomax planning voiding trial.  Palliative care consulted to establish goals of care.  Therapy evaluations completed due to patient's altered mental status and decreased functional mobility was admitted for a comprehensive rehab program.Patient transferred to CIR on 03/18/2020 .   Patient currently requires min assist with mobility secondary to muscle weakness, decreased cardiorespiratoy endurance, impaired timing and sequencing, unbalanced muscle activation and decreased motor planning,  , decreased initiation, decreased attention, decreased awareness, decreased problem solving, decreased safety awareness, decreased memory and delayed processing and decreased sitting balance, decreased standing balance, decreased postural control and decreased balance strategies.  Prior to hospitalization, patient was independent  with mobility and lived with Spouse (wife, Dub Mikes) in a House home.  Home access is 1 step-up from sidewalk to porch and another step-up into the house.  Patient will benefit from skilled PT intervention to maximize safe functional mobility, minimize fall risk and decrease caregiver burden for planned discharge home with 24 hour supervision.  Anticipate patient will benefit from follow up OP at discharge.  PT - End of Session Activity Tolerance: Tolerates 30+ min activity with multiple rests Endurance Deficit: Yes Endurance Deficit Description: requires seated rest breaks PT Assessment Rehab Potential (ACUTE/IP ONLY): Good PT Patient demonstrates impairments in the following area(s): Balance;Perception;Behavior;Safety;Sensory;Edema;Endurance;Skin Integrity;Motor;Nutrition;Pain PT Transfers Functional Problem(s): Bed Mobility;Bed to Chair;Car;Furniture;Floor PT Locomotion Functional Problem(s): Ambulation;Stairs PT Plan PT Intensity: Minimum of 1-2 x/day ,45 to 90  minutes PT Frequency: 5 out of 7 days PT Duration Estimated Length of Stay: ~1.5-2weeks PT Treatment/Interventions: Ambulation/gait training;Community reintegration;DME/adaptive equipment instruction;Neuromuscular re-education;Psychosocial support;Stair training;UE/LE Strength taining/ROM;Balance/vestibular training;Discharge planning;Functional electrical stimulation;Pain management;Skin care/wound management;Therapeutic Activities;UE/LE Coordination activities;Cognitive remediation/compensation;Disease management/prevention;Functional  mobility training;Patient/family education;Splinting/orthotics;Therapeutic Exercise;Visual/perceptual remediation/compensation PT Transfers Anticipated Outcome(s): supervision using LRAD PT Locomotion Anticipated Outcome(s): supervision using LRAD PT Recommendation Follow Up Recommendations: Outpatient PT;24 hour supervision/assistance Patient destination: Home Equipment Recommended: To be determined   PT Evaluation Precautions/Restrictions Precautions Precautions: Fall;Other (comment) Precaution Comments: impaired awareness and delayed verbal response Restrictions Weight Bearing Restrictions: No Pain Pain Assessment Pain Scale: Faces Pain Score: 0-No pain Faces Pain Scale: No hurt Home Living/Prior Functioning Home Living Living Arrangements: Spouse/significant other Available Help at Discharge: Available 24 hours/day;Family Type of Home: House Home Access: Stairs to enter Entrance Stairs-Number of Steps: 1 step-up from sidewalk to porch and another step-up into the house Home Layout: One level  Lives With: Spouse (wife, Dub Mikes) Prior Function Level of Independence: Independent with gait;Independent with transfers;Needs assistance with homemaking  Able to Take Stairs?: Yes (but uses something for stability (wall or HHA from wife) when stepping up to the house) Driving: No (not for >18yr due to dementia) Comments: had COVID ~1.5years ago and  used a RW for a duration but progressed off of AD transitioning to furniture walking Perception  Perception Perception: Not tested Comments: pt unable to verbally answer questions always replying "I don't know" but no obvious perceptual impairments observed during examination Praxis Praxis: Impaired Praxis Impairment Details: Initiation;Motor planning Praxis-Other Comments: noted poor motor planning upon entering vehicle simulator  Cognition Overall Cognitive Status: History of cognitive impairments - at baseline (with pt's wife stating it is worse now) Arousal/Alertness: Awake/alert Orientation Level: Oriented to person;Disoriented to place;Disoriented to time;Disoriented to situation (only able to state his name (not able to state his DOB) and otherwise replies "I don't know") Attention: Sustained;Selective Sustained Attention: Impaired Selective Attention: Impaired Memory: Impaired Awareness: Impaired Safety/Judgment: Impaired Comments: Pt with delayed intiation to tasks physically and verbally. Sensation Sensation Light Touch: Not tested (initiated assessment but pt unable to follow directions to complete task) Hot/Cold: Not tested Proprioception: Not tested (not formally assessed but no functional proprioceptive impairments noted) Stereognosis: Not tested Additional Comments: Pt unable to follow directions for sensory testing. Coordination Gross Motor Movements are Fluid and Coordinated: Yes Fine Motor Movements are Fluid and Coordinated: Yes Motor  Motor Motor: Abnormal postural alignment and control;Other (comment) Motor - Skilled Clinical Observations: Generalized weakness   Trunk/Postural Assessment  Cervical Assessment Cervical Assessment: Exceptions to [facility](forward head) Thoracic Assessment Thoracic Assessment: Exceptions to [facility](thoracic kyphosis) Lumbar Assessment Lumbar Assessment: Exceptions to [facility](posterior pelvic tilt) Postural Control Postural Control:  Deficits on evaluation Righting Reactions: delayed and inadequate with poor balance recovery strategy as pt often reaching for support from items in environment Postural Limitations: decreased  Balance Balance Balance Assessed: Yes Standardized Balance Assessment Standardized Balance Assessment: Timed Up and Go Test Timed Up and Go Test TUG: Normal TUG Normal TUG (seconds): 47.61 (using RW) Static Sitting Balance Static Sitting - Balance Support: Feet supported Static Sitting - Level of Assistance: 5: Stand by assistance Dynamic Sitting Balance Dynamic Sitting - Balance Support: During functional activity Dynamic Sitting - Level of Assistance: 4: Min aInsurance risk surveyorStanding - Balance Support: During functional activity Static Standing - Level of Assistance: 4: Min assist Dynamic Standing Balance Dynamic Standing - Balance Support: During functional activity Dynamic Standing - Level of Assistance: 4: Min assist;3: Mod assist Extremity Assessment      RLE Assessment RLE Assessment: Exceptions to [facility] Range of Motion (AROM) Comments: WFL General Strength Comments: unable to follow commands to participate in formal assessment; however, demonstrates grossly  at least 3+/5 to 4/5 functionally LLE Assessment LLE Assessment: Exceptions to [facility] Active Range of Motion (AROM) Comments: [facility] General Strength Comments: unable to follow commands to participate in formal assessment; however, demonstrates grossly at least 3+/5 to 4/5 functionally  Care Tool Care Tool Bed Mobility Roll left and right activity   Roll left and right assist level: Minimal Assistance - Patient > 75%    Sit to lying activity   Sit to lying assist level: Minimal Assistance - Patient > 75%    Lying to sitting edge of bed activity   Lying to sitting edge of bed assist level: Minimal Assistance - Patient > 75%     Care Tool Transfers Sit to stand transfer   Sit to stand assist level:  Minimal Assistance - Patient > 75%    Chair/bed transfer   Chair/bed transfer assist level: Minimal Assistance - Patient > 75%     Toilet transfer   Assist Level: Minimal Assistance - Patient > 75%    Car transfer   Car transfer assist level: Minimal Assistance - Patient > 75%      Care Tool Locomotion Ambulation   Assist level: Minimal Assistance - Patient > 75% Assistive device: Hand held assist Max distance: 155f  Walk 10 feet activity   Assist level: Minimal Assistance - Patient > 75% Assistive device: Hand held assist   Walk 50 feet with 2 turns activity   Assist level: Minimal Assistance - Patient > 75% Assistive device: Hand held assist  Walk 150 feet activity   Assist level: Minimal Assistance - Patient > 75% Assistive device: Hand held assist  Walk 10 feet on uneven surfaces activity   Assist level: Minimal Assistance - Patient > 75% Assistive device: Hand held assist  Stairs   Assist level: Minimal Assistance - Patient > 75% Stairs assistive device: 2 hand rails Max number of stairs: 4  Walk up/down 1 step activity   Walk up/down 1 step (curb) assist level: Minimal Assistance - Patient > 75% Walk up/down 1 step or curb assistive device: 2 hand rails    Walk up/down 4 steps activity Walk up/down 4 steps assist level: Minimal Assistance - Patient > 75% Walk up/down 4 steps assistive device: 2 hand rails  Walk up/down 12 steps activity Walk up/down 12 steps activity did not occur: Safety/medical concerns (fatigue)      Pick up small objects from floor Pick up small object from the floor (from standing position) activity did not occur: Safety/medical concerns      Wheelchair Will patient use wheelchair at discharge?: No          Wheel 50 feet with 2 turns activity      Wheel 150 feet activity        Refer to Care Plan for Long Term Goals  SHORT TERM GOAL WEEK 1 PT Short Term Goal 1 (Week 1): Pt will perform supine<>sit with CGA PT Short Term Goal 2  (Week 1): Pt will perform sit<>stands using LRAD with CGA PT Short Term Goal 3 (Week 1): Pt will perform bed<>chair transfers using LRAD with CGA PT Short Term Goal 4 (Week 1): Pt will ambulate at least 1072fusing LRAD with CGA PT Short Term Goal 5 (Week 1): Pt will step up/down 1 step using LRAD with CGA  Recommendations for other services: None   Skilled Therapeutic Intervention Pt received asleep, supine in bed with his wife, FrDub Mikespresent - upon awakening pt agreeable to therapy session. Evaluation completed (  see details above) with patient education regarding purpose of PT evaluation, PT POC and goals, therapy schedule, weekly team meetings, and other CIR information including safety plan and fall risk safety. Throughout entire session pt demos very delayed and limited verbal response to questions often only replying "I don't know." Therapist provided pt with wheelchair for increased upright, OOB activity tolerance while in CIR. Performed the following mobility tasks with specified assistance levels with therapist providing cuing for sequencing for safety. During gait pt demos increased forward trunk flexion, wide BOS, excessive B LE external rotation, and decreased step lengths and gait speed. Ascended/descended 4 steps using B HRs with self-selected reciprocal pattern. During curb navigation used R HHA and demos increased stability stepping up with L LE. Pt noted to be incontinent of bowels therefore transported back to room and performed transfer toilet - able to further void bowels continently on toilet - standing R UE support on HR performed LB clothing management and peri-care total assist for cleanliness. At end of session pt left sitting in w/c with needs in reach, seat belt alarm on, and pt's wife present.  Mobility Bed Mobility Bed Mobility: Supine to Sit;Sit to Supine Supine to Sit: Minimal Assistance - Patient > 75% Sit to Supine: Minimal Assistance - Patient >  75% Transfers Transfers: Sit to Stand;Stand to Sit;Stand Pivot Transfers Sit to Stand: Minimal Assistance - Patient > 75% Stand to Sit: Minimal Assistance - Patient > 75% Stand Pivot Transfers: Minimal Assistance - Patient > 75% Stand Pivot Transfer Details: Verbal cues for sequencing;Visual cues/gestures for sequencing;Verbal cues for technique;Verbal cues for precautions/safety;Tactile cues for posture;Tactile cues for sequencing;Tactile cues for weight shifting;Tactile cues for placement Transfer (Assistive device): 1 person hand held assist Locomotion  Gait Ambulation: Yes Gait Assistance: Minimal Assistance - Patient > 75% Gait Distance (Feet): 150 Feet Assistive device: 1 person hand held assist Gait Assistance Details: Tactile cues for sequencing;Tactile cues for weight shifting;Tactile cues for posture;Verbal cues for technique;Verbal cues for gait pattern Gait Gait: Yes Gait Pattern: Impaired Gait Pattern: Wide base of support;Poor foot clearance - left;Poor foot clearance - right;Decreased step length - left;Decreased step length - right Gait velocity: decreased as noted during TUG Stairs / Additional Locomotion Stairs: Yes Stairs Assistance: Minimal Assistance - Patient > 75% Stair Management Technique: Two rails Number of Stairs: 4 Height of Stairs: 6 Ramp: Minimal Assistance - Patient >75% Curb: Minimal Assistance - Patient >75% Wheelchair Mobility Wheelchair Mobility: No   Discharge Criteria: Patient will be discharged from PT if patient refuses treatment 3 consecutive times without medical reason, if treatment goals not met, if there is a change in medical status, if patient makes no progress towards goals or if patient is discharged from hospital.  The above assessment, treatment plan, treatment alternatives and goals were discussed and mutually agreed upon: by patient and by family  Tawana Scale , PT, DPT, CSRS  03/19/2020, 7:58 AM

## 2020-03-19 NOTE — Progress Notes (Signed)
Inpatient Rehabilitation Care Coordinator Assessment and Plan Patient Details  Name: RAJ LANDRESS MRN: [id number] Date of Birth: 1928/02/16  Today's Date: 03/19/2020  Hospital Problems: Principal Problem:   Subdural hematoma [facility])  Past Medical History:  Past Medical History:  Diagnosis Date  . Anemia   . Cancer ([place]) 2008   Myelodysplastic syndrome  . Dementia ([place])   . Hypothyroidism    Past Surgical History: History reviewed. No pertinent surgical history. Social History:  reports that he quit smoking about 41 years ago. He has never used smokeless tobacco. He reports current alcohol use. He reports that he does not use drugs.  Family / Support Systems Marital Status: Married Patient Roles: Spouse Spouse/Significant Other: [name] Children: Occupational hygienist (Daughter), Engineer, petroleum (Son) Anticipated Caregiver: Spouse, family, hired care givers Ability/Limitations of Caregiver: Spouse HOH Caregiver Availability: 24/7 Family Dynamics: Spouse visitor, all communications with daughter Insurance account manager)  Social History Preferred language: English Religion:  Read: Yes Write: Yes Legal History/Current Legal Issues: n/a Guardian/Conservator: n/a   Abuse/Neglect Abuse/Neglect Assessment Can Be Completed: Yes Physical Abuse: Denies Verbal Abuse: Denies Sexual Abuse: Denies Exploitation of patient/patient's resources: Denies Self-Neglect: Denies  Emotional Status Pt's affect, behavior and adjustment status: no Recent Psychosocial Issues: no Psychiatric History: no Substance Abuse History: no  Patient / Family Perceptions, Expectations & Goals Pt/Family understanding of illness & functional limitations: yes Premorbid pt/family roles/activities: Supervision at W/ RW due to cognition, required some assistance Anticipated changes in roles/activities/participation: Family will contiune to assit in home Pt/family expectations/goals: Supervision/Min [place]:  None Premorbid Home Care/DME Agencies: Other (Comment) ([employer], [employer] ([place]), Civil engineer, contracting) Transportation available at discharge: Family able to transport Resource referrals recommended: Neuropsychology  Discharge Planning Living Arrangements: Spouse/significant other Support Systems: Spouse/significant other Type of Residence: Private residence (1 level home, 1 step to enter) Administrator, sports: Magazine features editor (specify) Sports administrator) Financial Resources: Ecologist Screen Referred: No Living Expenses: Own Money Management: Patient,Spouse Does the patient have any problems obtaining your medications?: No Home Management: Spouse Patient/Family Preliminary Plans: Spouse will continue to assist Care Coordinator Anticipated Follow Up Needs: HH/OP,Other (comment) (Family not anticipating SNF, plans to hire HC 12 hours per day) DC Planning Additional Notes/Comments: patient with baseline dementia. Spouse reports being able to get patient dresses and walking with assistance. Supervision needed when outside the home. Spouse performs house management and medicine management Expected length of stay: 10-14 Days  Clinical Impression Sw met patient and spouse, introduced self explained role, addressed questions and concerns. Called patient daughter to provide information, no answer-left VM. Patient spouse still in process of determining patients d/c location based on progress. Goal is for patient to d/c home. 1 level home, 1 step to enter. Will continue to follow up with questions and concerns.   Dyanne Iha 03/19/2020, 12:29 PM

## 2020-03-19 NOTE — Evaluation (Signed)
Occupational Therapy Assessment and Plan  Patient Details  Name: Shaun Hobbs MRN: [id number] Date of Birth: March 24, 1928  OT Diagnosis: abnormal posture, altered mental status, cognitive deficits and muscle weakness (generalized) Rehab Potential: Rehab Potential (ACUTE ONLY): Good ELOS: 12-14 days   Today's Date: 03/19/2020 OT Individual Time: 3545-6256 OT Individual Time Calculation (min): 62 min     Hospital Problem: Principal Problem:   Subdural hematoma ([place])   Past Medical History:  Past Medical History:  Diagnosis Date  . Anemia   . Cancer ([place]) 2008   Myelodysplastic syndrome  . Dementia ([place])   . Hypothyroidism    Past Surgical History: History reviewed. No pertinent surgical history.  Assessment & Plan Clinical Impression: Patient is a 87 y.o. year old male with recent admission to the hospital on /30/2022 with AMS and generalized weakness. Wife had noted generalized decline over the past week. Head CT scan showed small SDH along left tentorium extending onto left falx and anteriorly into the medial aspect of the left middle cranial fossa as well as extending into the left occipital region measuring up to 4 mm thickness..  Patient transferred to CIR on 03/18/2020 .    Patient currently requires mod with basic self-care skills secondary to muscle weakness, unbalanced muscle activation, decreased initiation, decreased attention, decreased awareness, decreased problem solving, decreased safety awareness, decreased memory and delayed processing and decreased sitting balance, decreased standing balance and decreased balance strategies.  Prior to hospitalization, patient could complete ADLs with supervision.  Patient will benefit from skilled intervention to decrease level of assist with basic self-care skills and increase independence with basic self-care skills prior to discharge home with care partner.  Anticipate patient will require 24 hour supervision and follow up home  health.  OT - End of Session Activity Tolerance: Decreased this session Endurance Deficit: Yes OT Assessment Rehab Potential (ACUTE ONLY): Good OT Patient demonstrates impairments in the following area(s): Balance;Cognition;Endurance;Safety OT Basic ADL's Functional Problem(s): Grooming;Eating;Bathing;Dressing;Toileting OT Transfers Functional Problem(s): Toilet;Tub/Shower OT Additional Impairment(s): None OT Plan OT Intensity: Minimum of 1-2 x/day, 45 to 90 minutes OT Frequency: 5 out of 7 days OT Duration/Estimated Length of Stay: 12-14 days OT Treatment/Interventions: Balance/vestibular training;Discharge planning;Cognitive remediation/compensation;Disease mangement/prevention;Community reintegration;DME/adaptive equipment instruction;Neuromuscular re-education;Psychosocial support;UE/LE Strength taining/ROM;Patient/family education;Pain management;Self Care/advanced ADL retraining;Therapeutic Activities;UE/LE Coordination activities;Functional electrical stimulation;Therapeutic Exercise;Functional mobility training;Splinting/orthotics OT Self Feeding Anticipated Outcome(s): setup OT Basic Self-Care Anticipated Outcome(s): supervision OT Toileting Anticipated Outcome(s): supervision OT Bathroom Transfers Anticipated Outcome(s): supervision OT Recommendation Patient destination: Home Follow Up Recommendations: None Equipment Recommended: To be determined   OT Evaluation Precautions/Restrictions  Precautions Precautions: Fall Restrictions Weight Bearing Restrictions: No General   Vital Signs Therapy Vitals Temp: 52 F (36.7 C) Temp Source: Oral Pulse Rate: 62 Resp: 18 BP: 110/73 Patient Position (if appropriate): Lying Oxygen Therapy SpO2: 99 % O2 Device: Room Air Pain Pain Assessment Pain Scale: 0-10 Pain Score: 0-No pain Home Living/Prior Functioning Home Living Family/patient expects to be discharged to:: Private residence Living Arrangements:  Spouse/significant other Available Help at Discharge: Available 24 hours/day,Family Type of Home: House Home Access: Stairs to enter [employer] of Steps: 1 step-up from sidewalk to porch and another step-up into the house Home Layout: One level Bathroom Shower/Tub: Tub/shower Psychologist, occupational: Standard Bathroom Accessibility: Yes Additional Comments: pt with baseline dementia; wife states pt can get up and dressed and walk around house without assist. S needed when out of house. wife performs Nurse, learning disability and medicine management  Lives With: Spouse IADL History  Homemaking Responsibilities: No Current License: No Occupation: Retired Prior Function Level of Independence: Independent with gait,Independent with transfers,Needs assistance with homemaking,Needs assistance with ADLs  Able to Take Stairs?: Yes Driving: No Vocation: Retired Leisure: Hobbies-no Comments: had COVID ~1.5years ago and used a RW for a duration but progressed off of AD transitioning to furniture walking Vision Baseline Vision/History: Wears glasses Wears Glasses: At all times Patient Visual Report: No change from baseline Vision Assessment?:  (unable to assess seconddary to cognition) Perception  Perception: Not tested Praxis Praxis: Intact Cognition Overall Cognitive Status: History of cognitive impairments - at baseline Arousal/Alertness: Awake/alert Orientation Level: Person;Place;Situation Person: Oriented Place: Disoriented Situation: Disoriented Year:  (unable to state) Month:  (unable to state) Day of Week: Incorrect (did not state) Memory: Impaired Memory Impairment: Decreased short term memory;Decreased long term memory;Decreased recall of new information Decreased Long Term Memory: Verbal basic;Functional basic Decreased Short Term Memory: Functional basic Attention: Sustained Sustained Attention: Impaired Sustained Attention Impairment: Verbal  basic;Functional basic Awareness: Impaired Awareness Impairment: Emergent impairment Problem Solving: Impaired Problem Solving Impairment: Functional basic Executive Function:  (all impaired due to lower level deficits) Safety/Judgment: Impaired Comments: Pt with delayed intiation to tasks physically and verbally. Sensation Sensation Light Touch: Not tested Hot/Cold: Not tested Proprioception: Not tested Stereognosis: Not tested Additional Comments: Pt unable to follow directions for gross sensory testing. Coordination Gross Motor Movements are Fluid and Coordinated: Yes ([place] for selfcare tasks.) Fine Motor Movements are Fluid and Coordinated: Yes Motor  Motor Motor: Abnormal postural alignment and control Motor - Skilled Clinical Observations: Generalized weakness  Trunk/Postural Assessment  Cervical Assessment Cervical Assessment: Exceptions to [facility] (forward head) Thoracic Assessment Thoracic Assessment: Exceptions to [facility] (thoracic kyphosis) Lumbar Assessment Lumbar Assessment: Exceptions to [facility] (posterior pelvic tilt)  Balance Balance Balance Assessed: Yes Static Sitting Balance Static Sitting - Balance Support: Feet supported Static Sitting - Level of Assistance: 5: Stand by assistance Dynamic Sitting Balance Dynamic Sitting - Balance Support: During functional activity Dynamic Sitting - Level of Assistance: 4: Min assist Static Standing Balance Static Standing - Balance Support: During functional activity Static Standing - Level of Assistance: 4: Min assist Dynamic Standing Balance Dynamic Standing - Balance Support: During functional activity Dynamic Standing - Level of Assistance: 4: Min assist Extremity/Trunk Assessment RUE Assessment RUE Assessment: Within Functional Limits Active Range of Motion (AROM) Comments: WFLS for gross assessment during selfcare tasks General Strength Comments: Strength at least 3+/5 throughout noted during functional use, but not  formally assessed secondary to decreased cognition. LUE Assessment LUE Assessment: Within Functional Limits Active Range of Motion (AROM) Comments: AROM WFLs for selfcare tasks. General Strength Comments: Strength at least 3+/5 throughout noted during functional use, but not formally assessed secondary to decreased cognition.  Care Tool Care Tool Self Care Eating   Eating Assist Level: Total Assistance - Patient < 25% (per spouse report secondary to decreased intiatiion)    Oral Care         Bathing   Body parts bathed by patient: Right arm;Left arm;Chest;Abdomen;Right upper leg;Left upper leg;Right lower leg;Left lower leg;Face   Body parts n/a: Front perineal area;Buttocks Assist Level: Minimal Assistance - Patient > 75%    Upper Body Dressing(including orthotics)   What is the patient wearing?: Pull over shirt   Assist Level: Minimal Assistance - Patient > 75%    Lower Body Dressing (excluding footwear)   What is the patient wearing?: Pants Assist for lower body dressing: Moderate Assistance - Patient 50 - 74%  Putting on/Taking off footwear   What is the patient wearing?: Non-skid slipper socks;Ted hose Assist for footwear: Maximal Assistance - Patient 25 - 49%       Care Tool Toileting Toileting activity   Assist for toileting: Moderate Assistance - Patient 50 - 74%     Care Tool Bed Mobility Roll left and right activity        Sit to lying activity   Sit to lying assist level: Minimal Assistance - Patient > 75%    Lying to sitting edge of bed activity   Lying to sitting edge of bed assist level: Moderate Assistance - Patient 50 - 74%     Care Tool Transfers Sit to stand transfer   Sit to stand assist level: Minimal Assistance - Patient > 75%    Chair/bed transfer   Chair/bed transfer assist level: Minimal Assistance - Patient > 75%     Toilet transfer   Assist Level: Minimal Assistance - Patient > 75%     Care Tool Cognition Expression of Ideas  and Wants Expression of Ideas and Wants: Frequent difficulty - frequently exhibits difficulty with expressing needs and ideas   Understanding Verbal and Non-Verbal Content Understanding Verbal and Non-Verbal Content: Sometimes understands - understands only basic conversations or simple, direct phrases. Frequently requires cues to understand   Memory/Recall Ability *first 3 days only Memory/Recall Ability *first 3 days only: None of the above were recalled    Refer to Care Plan for Long Term Goals  SHORT TERM GOAL WEEK 1 OT Short Term Goal 1 (Week 1): Pt will complete LB bathing with min assist sit to stand. OT Short Term Goal 2 (Week 1): Pt will complete UB dressing with supervision. OT Short Term Goal 3 (Week 1): Pt will complete LB dressing with min guard assist. OT Short Term Goal 4 (Week 1): Pt will complete toilet transfers with min guard assist.  Recommendations for other services: None    Skilled Therapeutic Intervention ADL ADL Eating: Dependent (Per spouse as pt does not initiate) Where Assessed-Eating: Edge of bed Grooming: Minimal assistance Where Assessed-Grooming: Standing at sink Upper Body Bathing: Minimal assistance Where Assessed-Upper Body Bathing: Edge of bed Lower Body Bathing: Moderate assistance Where Assessed-Lower Body Bathing: Edge of bed Upper Body Dressing: Minimal assistance Where Assessed-Upper Body Dressing: Edge of bed Lower Body Dressing: Moderate assistance Where Assessed-Lower Body Dressing: Edge of bed Toileting: Moderate assistance Where Assessed-Toileting: Bedside Commode Toilet Transfer: Minimal assistance Toilet Transfer Method: Counselling psychologist: Radiographer, therapeutic: Not assessed Mobility  Bed Mobility Bed Mobility: Sit to Supine;Supine to Sit Supine to Sit: Moderate Assistance - Patient 50-74% Sit to Supine: Minimal Assistance - Patient > 75% Transfers Sit to Stand: Minimal Assistance - Patient >  75% Stand to Sit: Minimal Assistance - Patient > 75%  Session Note:  Pt with decreased verbal and physical initiation during session.  When asked orientation questions, he would shake his head as to say "no" but not verbally respond to the questions.  His wife was present throughout session and he responded better verbally to her instructions and questions.  Mod assist for initiation and completion of supine to sit for bathing EOB.  He needed step by step cueing for initiation of bathing tasks and was not able to understand picking up the washcloth and squeezing it out.  He had to be presented with it and then given a specific cue to wash a body part.  Decreased thoroughness noted throughout.  Also  he attempted to lay back down multiple times requiring max demonstrational cueing to remain sitting up to work on the tasks. He did better with initiation of donning pullover shirt when presented to him as well as pants, but not when given gripper socks.  Instructed his spouse to bring familiar items such as shoes, socks, and grooming tools to see if they help him with initiation.  Min assist for sit to stand when pulling items over hips and for ambulation to the sink briefly for combing his hair.  Finished session with transfer back to the bed to rest and with spouse in the room for supervision.  Discussed with her expectations for 1.5 to 2 week LOS with supervision goals and she was understanding of this estimate.    Discharge Criteria: Patient will be discharged from OT if patient refuses treatment 3 consecutive times without medical reason, if treatment goals not met, if there is a change in medical status, if patient makes no progress towards goals or if patient is discharged from hospital.  The above assessment, treatment plan, treatment alternatives and goals were discussed and mutually agreed upon: by patient and by family  Nishika Parkhurst OTR/L 03/19/2020, 6:05 PM

## 2020-03-20 LAB — CBC WITH DIFFERENTIAL/PLATELET
Abs Immature Granulocytes: 0 10*3/uL (ref 0.00–0.07)
Basophils Absolute: 0 10*3/uL (ref 0.0–0.1)
Basophils Relative: 0 %
Eosinophils Absolute: 0 10*3/uL (ref 0.0–0.5)
Eosinophils Relative: 0 %
HCT: 24 % — ABNORMAL LOW (ref 39.0–52.0)
Hemoglobin: 8 g/dL — ABNORMAL LOW (ref 13.0–17.0)
Immature Granulocytes: 0 %
Lymphocytes Relative: 31 %
Lymphs Abs: 0.9 10*3/uL (ref 0.7–4.0)
MCH: 31.9 pg (ref 26.0–34.0)
MCHC: 33.3 g/dL (ref 30.0–36.0)
MCV: 95.6 fL (ref 80.0–100.0)
Monocytes Absolute: 0.3 10*3/uL (ref 0.1–1.0)
Monocytes Relative: 9 %
Neutro Abs: 1.8 10*3/uL (ref 1.7–7.7)
Neutrophils Relative %: 60 %
Platelets: 27 10*3/uL — CL (ref 150–400)
RBC: 2.51 MIL/uL — ABNORMAL LOW (ref 4.22–5.81)
RDW: 17.5 % — ABNORMAL HIGH (ref 11.5–15.5)
WBC: 3 10*3/uL — ABNORMAL LOW (ref 4.0–10.5)
nRBC: 0 % (ref 0.0–0.2)

## 2020-03-20 MED ORDER — LEVETIRACETAM 500 MG PO TABS
500.0000 mg | ORAL_TABLET | Freq: Every day | ORAL | Status: DC
  Filled 2020-03-20: qty 1

## 2020-03-20 NOTE — Progress Notes (Signed)
Physical Therapy Session Note  Patient Details  Name: Shaun Hobbs MRN: [id number] Date of Birth: October 30, 1928  Today's Date: 03/20/2020 PT Individual Time: 1350-1417 PT Individual Time Calculation (min): 27 min   Short Term Goals: Week 1:  PT Short Term Goal 1 (Week 1): Pt will perform supine<>sit with CGA PT Short Term Goal 2 (Week 1): Pt will perform sit<>stands using LRAD with CGA PT Short Term Goal 3 (Week 1): Pt will perform bed<>chair transfers using LRAD with CGA PT Short Term Goal 4 (Week 1): Pt will ambulate at least 117f using LRAD with CGA PT Short Term Goal 5 (Week 1): Pt will step up/down 1 step using LRAD with CGA  Skilled Therapeutic Interventions/Progress Updates:   Pt received sitting EOB and agreeable to PT. [facility] instructed pt in TUG: 34 sec (average of 3 trials; >13.5 sec indicates increased fall risk)UE support on RW   Gait training with RWx [id number] supervision assist. Min cues for doorway management and direction. No LOB noted.   BLE strengthening exercise to perform lateral step ups x 5 BLE. Cues for continuation of task and decreased compensatory movements.   Pt returned to room and performed ambulatory transfer to bed with supervision assist and RW. Sit>supine completed without assist, and left supine in bed with call bell in reach and all needs met.          Therapy Documentation Precautions:  Precautions Precautions: Fall,Other (comment) Precaution Comments: impaired awareness and delayed verbal response Restrictions Weight Bearing Restrictions: No Vital Signs: Therapy Vitals Temp: 97.9 F (36.6 C) Pulse Rate: (!) 59 Resp: 16 BP: (!) 113/56 Patient Position (if appropriate): Sitting Oxygen Therapy SpO2: 99 % O2 Device: Room Air Pain: Pain Assessment Pain Scale: Faces Pain Score: 0-No pain    Therapy/Group: Individual Therapy  AusLorie Phenix11/2022, 2:20 PM

## 2020-03-20 NOTE — Progress Notes (Signed)
Speech Language Pathology Daily Session Note  Patient Details  Name: GLENDALE YOUNGBLOOD MRN: [id number] Date of Birth: Nov 08, 1928  Today's Date: 03/20/2020 SLP Individual Time: 9163-8466 SLP Individual Time Calculation (min): 56 min  Short Term Goals: Week 1: SLP Short Term Goal 1 (Week 1): Pt will demonstrate efficient mastication and oral clearance and minimal overt s/sx aspiration of Dys 3 textures X2 prior to advancement. SLP Short Term Goal 2 (Week 1): Pt will sustain attention to tasks in 3-5 minute intervals with Mod A cues for redirection. SLP Short Term Goal 3 (Week 1): Pt will orient to place and month with Mod A multimodal cues. SLP Short Term Goal 4 (Week 1): Pt will demonstrate ability to use external aids or other strategies to recall basic daily information with Max A cues. SLP Short Term Goal 5 (Week 1): Pt will demonstrate ability to problem solve basic familiar situations with Mod A multimodal cues. SLP Short Term Goal 6 (Week 1): Pt will demonstate ability to express basic needs and participate in naming tasks with overall Mod A for verbal initiation and/or word finding.  Skilled Therapeutic Interventions: Pt was seen for skilled ST targeting dysphagia and cognitive-linguistic goals. SLP provided thin H2O and an upgraded trial of Dys 3 texture solids (nutrigrain bar). Pt's mastication and oral clearance of solids was efficient, but in times of loss of attention to feeding/chewing, he exhibited mild oral holding, overall poor awareness of boluses, and intermittent throat clearing. Suspect these deficits are truly more cognitive than due to an oral/facial weakness, but still present safety issues with advanced solids and liquid intake. Recommend continue current diet with full supervision to cue for sustained attention and initiation. SLP also provided naming activities to further investigate expressive language. Pt names common ADL objects in photos with 60% accuracy - those counted as  "incorrect" he did not provide any response for. It was difficult to determine whether this was truly expressive language based or due to decreased attention and verbal initiation. Regardless, would recommend targeting communication deficits within a functional context and ability to communicate his basic wants and needs. Pt also required initially Max faded to Mod A verbal and visual cues for problem solving and awareness during a basic card sorting task (red and green). Pt sorted with 75% accuracy. Overall Max A required for orientation to place and time with use of external aids that SLP posted in room. Pt's wife present near end of session and conveyed she had hoped to see pt making more progress. SLP started to provide some education regarding the course of recovery for cognition in setting of acute illness and with co-morbidities such as Alzheimer's. Pt left laying in bed with alarm set and needs within reach. Continue per current plan of care.      Pain Pain Assessment Pain Scale: 0-10 Pain Score: 0-No pain  Therapy/Group: Individual Therapy  Arbutus Leas 03/20/2020, 7:22 AM

## 2020-03-20 NOTE — Progress Notes (Signed)
Patient ID: Shaun Hobbs, male   DOB: 10-May-1928, 87 y.o.   MRN: [id number] Met with the patient and his wife to review role of the nurse CM and address educational needs. Reviewed concerns with recommendations for encouraging fluids, and manage effects of the encephalopathy. Wife concerned she will not be able to handle patient at home, discussed therapy schedule and activities included in sessions along with the projected ELOS and estimated goals for discharge noting that team conference would shed light on determined outcomes and need for assistance. Wife concerned about labs and noted PLTs given every 2 weeks PTA; anticipates another round early next week. Encourage patient to be out of bed for meals and staff to take to the toilet. Wife can encourage fluids and order finger foods or items she know the patient likes. Patient is having difficulty with D2 textures as they are not what he is used to and he is quick to say he is full after a few bites. Patient needs encouragement to eat and drink. Continue to follow along to discharge and address educational needs. Margarito Liner

## 2020-03-20 NOTE — IPOC Note (Signed)
Overall Plan of Care [facility]) Patient Details Name: Shaun Hobbs MRN: [id number] DOB: 16-Dec-1928  Admitting Diagnosis: Subdural hematoma [facility])  Hospital Problems: Principal Problem:   Subdural hematoma ([place])     Functional Problem List: Nursing Behavior,Bladder,Bowel,Endurance,Medication Management,Edema,Nutrition,Pain,Safety,Skin Integrity  PT Balance,Perception,Behavior,Safety,Sensory,Edema,Endurance,Skin Integrity,Motor,Nutrition,Pain  OT Balance,Cognition,Endurance,Safety  SLP Cognition,Linguistic,Nutrition  TR         Basic ADL's: OT Grooming,Eating,Bathing,Dressing,Toileting     Advanced  ADL's: OT       Transfers: PT Bed Mobility,Bed to [place]  OT Toilet,Tub/Shower     Locomotion: PT Ambulation,Stairs     Additional Impairments: OT None  SLP Swallowing,Communication,Social Cognition expression Problem Solving,Memory,Attention,Awareness  TR      Anticipated Outcomes Item Anticipated Outcome  Self Feeding setup  Swallowing      Basic self-care  supervision  Toileting  supervision   Bathroom Transfers supervision  Bowel/Bladder  supervision/min assist  Transfers  supervision using LRAD  Locomotion  supervision using LRAD  Communication  Min A  Cognition  Min-Mod A  Pain  <3  Safety/Judgment  supervision/min assist   Therapy Plan: PT Intensity: Minimum of 1-2 x/day ,45 to 90 minutes PT Frequency: 5 out of 7 days PT Duration Estimated Length of Stay: ~1.5-2weeks OT Intensity: Minimum of 1-2 x/day, 45 to 90 minutes OT Frequency: 5 out of 7 days OT Duration/Estimated Length of Stay: 12-14 days SLP Intensity: Minumum of 1-2 x/day, 30 to 90 minutes SLP Frequency: 3 to 5 out of 7 days SLP Duration/Estimated Length of Stay: 1.5-2 weeks   Due to the current state of emergency, patients may not be receiving their 3-hours of Medicare-mandated therapy.   Team Interventions: Nursing Interventions Patient/Family  Education,Bladder Management,Bowel Management,Disease Management/Prevention,Pain Management,Medication Management,Skin Care/Wound Management,Cognitive Remediation/Compensation,Dysphagia/Aspiration Precaution Training,Discharge Planning,Psychosocial Support  PT interventions Ambulation/gait training,Community Corporate treasurer re-education,Psychosocial support,Stair training,UE/LE Strength taining/ROM,Balance/vestibular training,Discharge planning,Functional electrical stimulation,Pain management,Skin care/wound management,Therapeutic Activities,UE/LE Coordination activities,Cognitive remediation/compensation,Disease management/prevention,Functional mobility training,Patient/family education,Splinting/orthotics,Therapeutic Exercise,Visual/perceptual remediation/compensation  OT Interventions Balance/vestibular training,Discharge planning,Cognitive remediation/compensation,Disease mangement/prevention,Community Corporate treasurer re-education,Psychosocial support,UE/LE Strength taining/ROM,Patient/family education,Pain management,Self Care/advanced ADL retraining,Therapeutic Activities,UE/LE Coordination activities,Functional electrical stimulation,Therapeutic Exercise,Functional mobility training,Splinting/orthotics  SLP Interventions Cognitive remediation/compensation,Cueing hierarchy,Dysphagia/aspiration precaution training,Functional tasks,Patient/family education,Therapeutic Activities,Speech/Language facilitation,Internal/external aids,Environmental controls  TR Interventions    SW/CM Interventions Discharge Planning,Psychosocial Support,Patient/Family Education,Disease Management/Prevention   Barriers to Discharge MD  Medical stability  Nursing Inaccessible home environment,Decreased caregiver support,Home environment access/layout,Incontinence,Lack of/limited family support,Medication compliance,Behavior,Nutrition  means    PT      OT      SLP      SW       Team Discharge Planning: Destination: PT-Home ,OT- Home , SLP-Home Projected Follow-up: PT-Outpatient PT,24 hour supervision/assistance, OT-  None, SLP-Home Health SLP,24 hour supervision/assistance,Outpatient SLP Projected Equipment Needs: PT-To be determined, OT- To be determined, SLP-None recommended by SLP Equipment Details: PT- , OT-  Patient/family involved in discharge planning: PT- Family member/caregiver,Patient,  OT-Family member/caregiver, SLP-Patient,Family member/caregiver  MD ELOS: 10-14 days S/MinA Medical Rehab Prognosis:  Excellent Assessment: Shaun Hobbs is an 87 year old man who is admitted to CIR with progressive weakness with altered mental status secondary to nontraumatic SDH. He has myelodysplastic syndrome and PC has been monitored daily with transfusion goal is <20. He has dysphagia and nutritions foods have been discussed with his wife. Foley has been removed and voiding trial has been initiated.    See Team Conference Notes for weekly updates to the plan of care

## 2020-03-20 NOTE — Progress Notes (Signed)
Occupational Therapy Session Note  Patient Details  Name: Shaun Hobbs MRN: [id number] Date of Birth: Jan 10, 1929  Today's Date: 03/20/2020 OT Missed Time: 71 Minutes Missed Time Reason: Patient fatigue   Short Term Goals: Week 1:  OT Short Term Goal 1 (Week 1): Pt will complete LB bathing with min assist sit to stand. OT Short Term Goal 2 (Week 1): Pt will complete UB dressing with supervision. OT Short Term Goal 3 (Week 1): Pt will complete LB dressing with min guard assist. OT Short Term Goal 4 (Week 1): Pt will complete toilet transfers with min guard assist.  Skilled Therapeutic Interventions/Progress Updates:    Pt received asleep in bed. Despite max multimodal cuing and attempting to assist pt EOB, pt unable to rouse enough to be able to meaningfully participate in therapy. Pt left supine in bed, call bell in reach, bed alarm engaged.   Therapy Documentation Precautions:  Precautions Precautions: Fall,Other (comment) Precaution Comments: impaired awareness and delayed verbal response Restrictions Weight Bearing Restrictions: No   Therapy/Group: Individual Therapy  Volanda Napoleon MS, OTR/L  03/20/2020, 6:32 AM

## 2020-03-20 NOTE — Progress Notes (Signed)
Occupational Therapy Session Note  Patient Details  Name: Shaun Hobbs MRN: [id number] Date of Birth: [birth year]  Today's Date: 03/20/2020 OT Individual Time: 0347-4259 OT Individual Time Calculation (min): 60 min    Short Term Goals: Week 1:  OT Short Term Goal 1 (Week 1): Pt will complete LB bathing with min assist sit to stand. OT Short Term Goal 2 (Week 1): Pt will complete UB dressing with supervision. OT Short Term Goal 3 (Week 1): Pt will complete LB dressing with min guard assist. OT Short Term Goal 4 (Week 1): Pt will complete toilet transfers with min guard assist.  Skilled Therapeutic Interventions/Progress Updates:    Pt in bed to start session with spouse present.  He was able to transfer to the EOB with min assist and mod demonstrational cueing verbal cues to initiate transition.  He then ambulated to the shower where he was able to remove clothing for bathing with max instructional cueing and overall mod assist for LB and min assist for UB.  He completed shower with mod instructional cueing to sequence.  When told to initiate task and wash his arm he did proceed to wash other body parts with decreased thoroughness.  Therapist had to help with setup of washcloth as well as he used little soap.  Decreased initiation at times to instructions in the shower with 3-4 second delay to initiate washing his lower legs and his feet when cued.  Once he dried off, he transferred out to the wheelchair with min hand held assist for balance.  He was able to donn the pullover shirt with supervision.  Mod assist for donning brief and min assist for pants sit to stand.  Therapist then donned his TEDs with total assist while pt donned and tied his shoes with min facilitation.  Noted pt with much better initiation and completion of dressing tasks compared to bathing tasks.  Finished session with pt up in the wheelchair and with his spouse present.  Safety belt in place.    Therapy  Documentation Precautions:  Precautions Precautions: Fall,Other (comment) Precaution Comments: impaired awareness and delayed verbal response Restrictions Weight Bearing Restrictions: No  Pain: Pain Assessment Pain Scale: Faces Pain Score: 0-No pain ADL: See Care Tool Section for some details of mobility and selfcare  Therapy/Group: Individual Therapy  Nedim Oki OTR/L 03/20/2020, 12:47 PM

## 2020-03-20 NOTE — Progress Notes (Signed)
[place] PHYSICAL MEDICINE & REHABILITATION PROGRESS NOTE   Subjective/Complaints: No complaints this morning Elta [place] is assisting him in eating his food. He has eaten his entire waffle and is eating his grits. He gives his name.  ROS: Unable to obtain due to mental status.   Objective:   No results found. Recent Labs    03/19/20 0518 03/20/20 0453  WBC 3.6* 3.0*  HGB 7.9* 8.0*  HCT 23.4* 24.0*  PLT 34* 27*   Recent Labs    03/19/20 0518  NA 134*  K 4.0  CL 102  CO2 24  GLUCOSE 103*  BUN 17  CREATININE 0.92  CALCIUM 8.5*    Intake/Output Summary (Last 24 hours) at 03/20/2020 1140 Last data filed at 03/20/2020 1040 Gross per 24 hour  Intake 747 ml  Output 1100 ml  Net -353 ml        Physical Exam: Vital Signs Blood pressure (!) 107/59, pulse 68, temperature 97.6 F (36.4 C), resp. rate 20, height 5\' 10"  (1.778 m), weight 69 kg, SpO2 96 %. Gen: no distress, normal appearing HEENT: oral mucosa pink and moist, NCAT Cardio: Reg rate Chest: normal effort, normal rate of breathing Abd: soft, non-distended Ext: no edema Psych: pleasant, normal affect Musculoskeletal:     Cervical back: Normal range of motion and neck supple.     Comments: No edema or tenderness in extremities  Skin:    General: Skin is warm and dry.  Neurological:     Mental Status: He is alert.     Comments: Alert-mostly stare Oriented to first name only Limited engagement and following commands, however moving all extremities spontaneously  Psychiatric:     Comments: Limited due to dementia    Assessment/Plan: 1. Functional deficits which require 3+ hours per day of interdisciplinary therapy in a comprehensive inpatient rehab setting.  Physiatrist is providing close team supervision and 24 hour management of active medical problems listed below.  Physiatrist and rehab team continue to assess barriers to discharge/monitor patient progress toward functional and medical goals  Care  Tool:  Bathing    Body parts bathed by patient: Right arm,Left arm,Chest,Abdomen,Right upper leg,Left upper leg,Right lower leg,Left lower leg,Face     Body parts n/a: Front perineal area,Buttocks   Bathing assist Assist Level: Minimal Assistance - Patient > 75%     Upper Body Dressing/Undressing Upper body dressing   What is the patient wearing?: Pull over shirt    Upper body assist Assist Level: Minimal Assistance - Patient > 75%    Lower Body Dressing/Undressing Lower body dressing      What is the patient wearing?: Pants     Lower body assist Assist for lower body dressing: Moderate Assistance - Patient 50 - 74%     Toileting Toileting    Toileting assist Assist for toileting: Dependent - Patient 0%     Transfers Chair/bed transfer  Transfers assist     Chair/bed transfer assist level: Minimal Assistance - Patient > 75%     Locomotion Ambulation   Ambulation assist      Assist level: Minimal Assistance - Patient > 75% Assistive device: Hand held assist Max distance: 122ft   Walk 10 feet activity   Assist     Assist level: Minimal Assistance - Patient > 75% Assistive device: Hand held assist   Walk 50 feet activity   Assist    Assist level: Minimal Assistance - Patient > 75% Assistive device: Hand held assist    Walk 150  feet activity   Assist    Assist level: Minimal Assistance - Patient > 75% Assistive device: Hand held assist    Walk 10 feet on uneven surface  activity   Assist     Assist level: Minimal Assistance - Patient > 75% Assistive device: Hand held assist   Wheelchair     Assist Will patient use wheelchair at discharge?: No             Wheelchair 50 feet with 2 turns activity    Assist            Wheelchair 150 feet activity     Assist          Blood pressure (!) 107/59, pulse 68, temperature 97.6 F (36.4 C), resp. rate 20, height 5\' 10"  (1.778 m), weight 69 kg, SpO2 96  %.    Medical Problem List and Plan: 1.  Progressive weakness with altered mental status secondary to nontraumatic left SDH             -patient may shower             -ELOS/Goals: 10-14 days/supervision/min a             Continue CIR 2.  Antithrombotics: -DVT/anticoagulation: SCDs             -antiplatelet therapy: N/A 3. Pain Management: Tylenol as needed 4. Mood: Aricept 10 mg daily             -antipsychotic agents: N/A 5. Neuropsych: This patient is not capable of making decisions on his own behalf. 6. Skin/Wound Care: Routine skin checks 7. Fluids/Electrolytes/Nutrition: Routine in and outs             CMP reviewed, significant for hypoglycemia on 2/10.  8.  Dysphagia.  Dysphagia #1 thin liquid diet             Advance diet as tolerated  Discussed incorporating high protein foods as much as possible.  9.  Seizure prophylaxis. It has been 12 days since SDH, wean Keppra to 500mg  daily 10.  Myelodysplastic syndrome/thrombocytopenia.  Latest platelet count 52,000.  Follow-up outpatient Dr. Irene Limbo.  Continue Amicar 1250 mg 3 times daily             2/11: PC reviewed, down to 27. Will transfuse if <20. Repeat tomorrow.  11. Hypothyroidism: Synthroid 12.  BPH/urinary retention.  Foley d/ced. Flomax 0.4 mg nightly.               Voiding trial 13. Anemia: Hgb reviewed, 8.0 on 2/11, repeat tomorrow, transfuse if <6  LOS: 2 days A FACE TO FACE EVALUATION WAS PERFORMED  Syreeta Figler P Giulietta Prokop 03/20/2020, 11:40 AM

## 2020-03-21 DIAGNOSIS — R339 Retention of urine, unspecified: Secondary | ICD-10-CM

## 2020-03-21 DIAGNOSIS — D469 Myelodysplastic syndrome, unspecified: Secondary | ICD-10-CM

## 2020-03-21 DIAGNOSIS — D696 Thrombocytopenia, unspecified: Secondary | ICD-10-CM

## 2020-03-21 LAB — CBC WITH DIFFERENTIAL/PLATELET
Abs Immature Granulocytes: 0 10*3/uL (ref 0.00–0.07)
Basophils Absolute: 0 10*3/uL (ref 0.0–0.1)
Basophils Relative: 0 %
Eosinophils Absolute: 0 10*3/uL (ref 0.0–0.5)
Eosinophils Relative: 0 %
HCT: 24.6 % — ABNORMAL LOW (ref 39.0–52.0)
Hemoglobin: 7.8 g/dL — ABNORMAL LOW (ref 13.0–17.0)
Immature Granulocytes: 0 %
Lymphocytes Relative: 35 %
Lymphs Abs: 1 10*3/uL (ref 0.7–4.0)
MCH: 31.2 pg (ref 26.0–34.0)
MCHC: 31.7 g/dL (ref 30.0–36.0)
MCV: 98.4 fL (ref 80.0–100.0)
Monocytes Absolute: 0.3 10*3/uL (ref 0.1–1.0)
Monocytes Relative: 11 %
Neutro Abs: 1.6 10*3/uL — ABNORMAL LOW (ref 1.7–7.7)
Neutrophils Relative %: 54 %
Platelets: 22 10*3/uL — CL (ref 150–400)
RBC: 2.5 MIL/uL — ABNORMAL LOW (ref 4.22–5.81)
RDW: 17.3 % — ABNORMAL HIGH (ref 11.5–15.5)
WBC: 3 10*3/uL — ABNORMAL LOW (ref 4.0–10.5)
nRBC: 0 % (ref 0.0–0.2)

## 2020-03-21 MED ORDER — CHLORHEXIDINE GLUCONATE CLOTH 2 % EX PADS
6.0000 | MEDICATED_PAD | Freq: Every day | CUTANEOUS | Status: DC
  Administered 2020-03-21 – 2020-03-27 (×7): 6 via TOPICAL

## 2020-03-21 MED ORDER — BETHANECHOL CHLORIDE 10 MG PO TABS
10.0000 mg | ORAL_TABLET | Freq: Three times a day (TID) | ORAL | Status: DC
  Administered 2020-03-21 – 2020-03-22 (×3): 10 mg via ORAL
  Filled 2020-03-21 (×3): qty 1

## 2020-03-21 MED ORDER — LIDOCAINE HCL URETHRAL/MUCOSAL 2 % EX GEL
1.0000 "application " | Freq: Once | CUTANEOUS | Status: AC
  Administered 2020-03-21: 1 via URETHRAL
  Filled 2020-03-21: qty 5

## 2020-03-21 MED ORDER — LEVETIRACETAM 100 MG/ML PO SOLN
500.0000 mg | Freq: Every day | ORAL | Status: DC
  Administered 2020-03-21 – 2020-03-28 (×8): 500 mg via ORAL
  Filled 2020-03-21 (×8): qty 5

## 2020-03-21 NOTE — Progress Notes (Signed)
[place] PHYSICAL MEDICINE & REHABILITATION PROGRESS NOTE   Subjective/Complaints: No new issues. Slept well by report  ROS: Limited due to cognitive/behavioral   Objective:   No results found. Recent Labs    03/20/20 0453 03/21/20 0523  WBC 3.0* 3.0*  HGB 8.0* 7.8*  HCT 24.0* 24.6*  PLT 27* 22*   Recent Labs    03/19/20 0518  NA 134*  K 4.0  CL 102  CO2 24  GLUCOSE 103*  BUN 17  CREATININE 0.92  CALCIUM 8.5*    Intake/Output Summary (Last 24 hours) at 03/21/2020 0926 Last data filed at 03/21/2020 0315 Gross per 24 hour  Intake 240 ml  Output 1175 ml  Net -935 ml        Physical Exam: Vital Signs Blood pressure (!) 109/52, pulse (!) 59, temperature 98.5 F (36.9 C), resp. rate 20, height 5\' 10"  (1.778 m), weight 69 kg, SpO2 96 %. Constitutional: No distress . Vital signs reviewed. HEENT: EOMI, oral membranes moist Neck: supple Cardiovascular: RRR without murmur. No JVD    Respiratory/Chest: CTA Bilaterally without wheezes or rales. Normal effort    GI/Abdomen: BS +, non-tender, non-distended Ext: no clubbing, cyanosis, or edema Psych:  cooperative Musculoskeletal:     Cervical back: Normal range of motion and neck supple.     Comments: No edema or tenderness in extremities  Skin:    General: Skin is warm and dry.  Neurological:     Mental Status: He is alert.   delayed. Follows simple command     Assessment/Plan: 1. Functional deficits which require 3+ hours per day of interdisciplinary therapy in a comprehensive inpatient rehab setting.  Physiatrist is providing close team supervision and 24 hour management of active medical problems listed below.  Physiatrist and rehab team continue to assess barriers to discharge/monitor patient progress toward functional and medical goals  Care Tool:  Bathing    Body parts bathed by patient: Right arm,Left arm,Chest,Abdomen,Right upper leg,Left upper leg,Right lower leg,Left lower leg,Face,Front  perineal area   Body parts bathed by helper: Buttocks Body parts n/a: Front perineal area,Buttocks   Bathing assist Assist Level: Minimal Assistance - Patient > 75%     Upper Body Dressing/Undressing Upper body dressing   What is the patient wearing?: Pull over shirt    Upper body assist Assist Level: Supervision/Verbal cueing    Lower Body Dressing/Undressing Lower body dressing      What is the patient wearing?: Pants,Incontinence brief     Lower body assist Assist for lower body dressing: Moderate Assistance - Patient 50 - 74%     Toileting Toileting    Toileting assist Assist for toileting: Dependent - Patient 0%     Transfers Chair/bed transfer  Transfers assist     Chair/bed transfer assist level: Minimal Assistance - Patient > 75%     Locomotion Ambulation   Ambulation assist      Assist level: Minimal Assistance - Patient > 75% Assistive device: Hand held assist Max distance: 134ft   Walk 10 feet activity   Assist     Assist level: Minimal Assistance - Patient > 75% Assistive device: Hand held assist   Walk 50 feet activity   Assist    Assist level: Minimal Assistance - Patient > 75% Assistive device: Hand held assist    Walk 150 feet activity   Assist    Assist level: Minimal Assistance - Patient > 75% Assistive device: Hand held assist    Walk 10 feet on  uneven surface  activity   Assist     Assist level: Minimal Assistance - Patient > 75% Assistive device: Hand held assist   Wheelchair     Assist Will patient use wheelchair at discharge?: No             Wheelchair 50 feet with 2 turns activity    Assist            Wheelchair 150 feet activity     Assist          Blood pressure (!) 109/52, pulse (!) 59, temperature 98.5 F (36.9 C), resp. rate 20, height 5\' 10"  (1.778 m), weight 69 kg, SpO2 96 %.    Medical Problem List and Plan: 1.  Progressive weakness with altered mental status  secondary to nontraumatic left SDH             -patient may shower             -ELOS/Goals: 10-14 days/supervision/min a             Continue CIR 2.  Antithrombotics: -DVT/anticoagulation: SCDs             -antiplatelet therapy: N/A 3. Pain Management: Tylenol as needed 4. Mood: Aricept 10 mg daily             -antipsychotic agents: N/A 5. Neuropsych: This patient is not capable of making decisions on his own behalf. 6. Skin/Wound Care: Routine skin checks 7. Fluids/Electrolytes/Nutrition: Routine in and outs             CMP reviewed, significant for hypoglycemia on 2/10.  8.  Dysphagia.  Dysphagia #1 thin liquid diet             Advance diet as tolerated  Discussed incorporating high protein foods as much as possible.  9.  Seizure prophylaxis.   -keppra decreased to 500mg  daily starting 2/12 10.  Myelodysplastic syndrome/thrombocytopenia.  Latest platelet count 52,000.  Follow-up outpatient Dr. Irene Limbo.  Continue Amicar 1250 mg 3 times daily             2/12: PC reviewed, down further to 22k. Will transfuse if <20.    -recheck Sunday 11. Hypothyroidism: Synthroid 12.  BPH/urinary retention.  Foley d/ced. Flomax 0.4 mg nightly.               still req I/O caths, sometimes traumatic   2/12 add urecholine 13. Anemia: Hgb reviewed, 8.0 on 2/11, 7.8 2/12  -f/u sunday LOS: 3 days A FACE TO FACE EVALUATION WAS PERFORMED  Meredith Staggers 03/21/2020, 9:26 AM

## 2020-03-21 NOTE — Plan of Care (Signed)
  Problem: RH BLADDER ELIMINATION Goal: RH STG MANAGE BLADDER WITH ASSISTANCE Description: STG Manage Bladder With mod I Assistance Outcome: Not Progressing; insert caude cath per order

## 2020-03-21 NOTE — Progress Notes (Signed)
Occupational Therapy Session Note  Patient Details  Name: Shaun Hobbs MRN: [id number] Date of Birth: 1928-06-04  Today's Date: 03/21/2020 OT Individual Time: 1000-1102 OT Individual Time Calculation (min): 62 min    Short Term Goals: Week 1:  OT Short Term Goal 1 (Week 1): Pt will complete LB bathing with min assist sit to stand. OT Short Term Goal 2 (Week 1): Pt will complete UB dressing with supervision. OT Short Term Goal 3 (Week 1): Pt will complete LB dressing with min guard assist. OT Short Term Goal 4 (Week 1): Pt will complete toilet transfers with min guard assist.  Skilled Therapeutic Interventions/Progress Updates:    Pt received semi-reclined in bed with wife present, agreeable to therapy. Session focus on showering, dressing, grooming, and func mobility. Supine > sitting EOB with CGA. Amb to toilet with RW + CGA for balance, LB clothing management with mod A for initiation. No void of bladder, noted blood in brief, RN aware. TTB transfer with CGA + RW. Doffed shirt with close S. Bathed UB with min A for thoroughness, LB with min A to bathe buttocks/for thoroughness. Pt verbalizing "it's too cold/hot" throughout shower. Req VC/gestural cues to initiate each bathing task, given LH sponge and initiates tasks with decreased time than with wash cloth. Stand-pivot to w/c with RW + CGA. Donned shirt with min A to adjust over body, donned new brief/pants with min A to thread BLE + for balance during STS. Donned TEDS with total A, but able to don socks/shoes and tie them with close S. Stood to brush teeth with min A for balance. Amb ~40 ft in hallway with RW + CGA. Sitting EOB > supine with CGA. Wife with questions regarding POC and if pt will need hospital bed or not. Informed wife that that will be determined by pt's primary team. Pt left semi-reclined in bed with call bell in reach, bed alarm engaged, and wife present.  Therapy Documentation Precautions:  Precautions Precautions:  Fall,Other (comment) Precaution Comments: impaired awareness and delayed verbal response Restrictions Weight Bearing Restrictions: No Pain: Pain Assessment Pain Scale: Faces Pain Score: 0-No pain Faces Pain Scale: No hurt ADL: See Care Tool for more details.     Therapy/Group: Individual Therapy  Volanda Napoleon MS, OTR/L  03/21/2020, 12:24 PM

## 2020-03-21 NOTE — Progress Notes (Signed)
Physical Therapy Session Note  Patient Details  Name: KYLOR VALVERDE MRN: [id number] Date of Birth: [birth year]  Today's Date: 03/21/2020 PT Individual Time: 5364-6803 PT Individual Time Calculation (min): 70 min   Short Term Goals: Week 1:  PT Short Term Goal 1 (Week 1): Pt will perform supine<>sit with CGA PT Short Term Goal 2 (Week 1): Pt will perform sit<>stands using LRAD with CGA PT Short Term Goal 3 (Week 1): Pt will perform bed<>chair transfers using LRAD with CGA PT Short Term Goal 4 (Week 1): Pt will ambulate at least 116ft using LRAD with CGA PT Short Term Goal 5 (Week 1): Pt will step up/down 1 step using LRAD with CGA  Skilled Therapeutic Interventions/Progress Updates:    Patient received sitting up in bed, wife at bedside. He denies pain. He does require extensive encouragement to participate in therapy, consistently shaking his head "no" when asked to participate. Wife able to assist in encouraging patient. ModA to don pants, MaxA to don shoes. He was able to come sit edge of bed with supervision and use of bed rails. CGA to stand and CGA to ambulate ~118ft to therapy gym. PT attempting to complete Berg balance assessment on patient. However, patient only able to follow simple, single-step commands intermittently, so not able to complete Berg balance scale assessment. He does demonstrate adequate ambulatory balance with 1-person assist and no LOB noted. Patient completing dynamic balance dual task with matching playing cards, no UE support + close supervision. Patient able to attend to task for roughly 45s at a time and match cards accurately ~80% of the time. Patient sitting down once he loses attention to task and requires Max cuing to return to task. He requested to use the bathroom and ambulated back to his room with RW and light CGA. Patient able to complete clothing management in standing with supervision and verbal cues. He reported having to have a bowel movement, but was  unable to do so. Small amount of bowel incontinence noted in brief, however. TotalA to change out brief. Patient remaining up in recliner, seatbelt alarm on, call light within reach, wife at bedside.   Therapy Documentation Precautions:  Precautions Precautions: Fall,Other (comment) Precaution Comments: impaired awareness and delayed verbal response Restrictions Weight Bearing Restrictions: No   Therapy/Group: Individual Therapy  Karoline Caldwell, PT, DPT, CBIS  03/21/2020, 7:31 AM

## 2020-03-21 NOTE — Progress Notes (Signed)
Speech Language Pathology Daily Session Note  Patient Details  Name: Shaun Hobbs MRN: [id number] Date of Birth: 05-Feb-1929  Today's Date: 03/21/2020 SLP Individual Time: 5102-5852 SLP Individual Time Calculation (min): 56 min  Short Term Goals: Week 1: SLP Short Term Goal 1 (Week 1): Pt will demonstrate efficient mastication and oral clearance and minimal overt s/sx aspiration of Dys 3 textures X2 prior to advancement. SLP Short Term Goal 2 (Week 1): Pt will sustain attention to tasks in 3-5 minute intervals with Mod A cues for redirection. SLP Short Term Goal 3 (Week 1): Pt will orient to place and month with Mod A multimodal cues. SLP Short Term Goal 4 (Week 1): Pt will demonstrate ability to use external aids or other strategies to recall basic daily information with Max A cues. SLP Short Term Goal 5 (Week 1): Pt will demonstrate ability to problem solve basic familiar situations with Mod A multimodal cues. SLP Short Term Goal 6 (Week 1): Pt will demonstate ability to express basic needs and participate in naming tasks with overall Mod A for verbal initiation and/or word finding.  Skilled Therapeutic Interventions: Pt seen for skilled ST targeting dysphagia and cognitive linguistic goals during AM meal. Pt sitting EOB with reduced visual and auditory distractions for Dys 2/thin meal with trials of Dys 3 texture. Pt continues to demonstrate mild extending mastication and occasional oral holding of solids 2' decreased attention to task. No significant difference in oral phase of Dys 2 texture vs Dys 3 texture. Pt benefits from cues to utilize liquid wash to clear mild oral stasis after swallow. No overt s/s aspiration with any PO intake this date, continue current diet with full supervision 2' cognitive implications impacting safety with intake. SLP facilitating orientation task by providing max A verbal and visual cues and extra time. Pt able to carryover simple orientation information with 25%  accuracy after 5 minute delay. Pt left lying in bed with alarm set and all needs within reach. Cont ST POC.  Pain Pain Assessment Pain Scale: 0-10 Pain Score: 0-No pain  Therapy/Group: Individual Therapy  Shaun Hobbs 03/21/2020, 8:47 AM

## 2020-03-21 NOTE — Progress Notes (Signed)
Noted bleeding,pain and swelling of penis; Patient is I and O cath q8 . MD notified. New orders noted.

## 2020-03-22 LAB — CBC
HCT: 24.3 % — ABNORMAL LOW (ref 39.0–52.0)
Hemoglobin: 7.8 g/dL — ABNORMAL LOW (ref 13.0–17.0)
MCH: 31.2 pg (ref 26.0–34.0)
MCHC: 32.1 g/dL (ref 30.0–36.0)
MCV: 97.2 fL (ref 80.0–100.0)
Platelets: 17 10*3/uL — CL (ref 150–400)
RBC: 2.5 MIL/uL — ABNORMAL LOW (ref 4.22–5.81)
RDW: 17.4 % — ABNORMAL HIGH (ref 11.5–15.5)
WBC: 2.7 10*3/uL — ABNORMAL LOW (ref 4.0–10.5)
nRBC: 0 % (ref 0.0–0.2)

## 2020-03-22 MED ORDER — ACETAMINOPHEN 325 MG PO TABS
650.0000 mg | ORAL_TABLET | Freq: Four times a day (QID) | ORAL | Status: DC | PRN
  Administered 2020-03-22 – 2020-03-24 (×3): 650 mg via ORAL
  Filled 2020-03-22 (×3): qty 2

## 2020-03-22 MED ORDER — SODIUM CHLORIDE 0.9% IV SOLUTION: Freq: Once | INTRAVENOUS | Status: AC

## 2020-03-22 NOTE — Plan of Care (Signed)
  Problem: RH BLADDER ELIMINATION Goal: RH STG MANAGE BLADDER WITH ASSISTANCE Description: STG Manage Bladder With mod I Assistance Outcome: Not Progressing; coude cath

## 2020-03-22 NOTE — Progress Notes (Signed)
[place] PHYSICAL MEDICINE & REHABILITATION PROGRESS NOTE   Subjective/Complaints: Pt struggled with I/O caths yesterday with irritation, bleeding, penile swelling. Foley placed. Slept well  ROS: Limited due to cognitive/behavioral   Objective:   No results found. Recent Labs    03/21/20 0523 03/22/20 0458  WBC 3.0* 2.7*  HGB 7.8* 7.8*  HCT 24.6* 24.3*  PLT 22* 17*   No results for input(s): NA, K, CL, CO2, GLUCOSE, BUN, CREATININE, CALCIUM in the last 72 hours.  Intake/Output Summary (Last 24 hours) at 03/22/2020 0811 Last data filed at 03/22/2020 0806 Gross per 24 hour  Intake 320 ml  Output 1200 ml  Net -880 ml        Physical Exam: Vital Signs Blood pressure (!) 107/51, pulse 64, temperature 97.9 F (36.6 C), resp. rate 14, height 5\' 10"  (1.778 m), weight 69 kg, SpO2 98 %. Constitutional: No distress . Vital signs reviewed. HEENT: EOMI, oral membranes moist Neck: supple Cardiovascular: RRR without murmur. No JVD    Respiratory/Chest: CTA Bilaterally without wheezes or rales. Normal effort    GI/Abdomen: BS +, non-tender, non-distended Ext: no clubbing, cyanosis, or edema Psych: flat, cooperative GU: foley in place, urine pink/orange in color Musculoskeletal:     Cervical back: Normal range of motion and neck supple.     Comments: No edema or tenderness in extremities  Skin:    General: Skin is warm and dry.  Neurological:     Mental Status: He is alert.   delayed. Follows simple commands    Assessment/Plan: 1. Functional deficits which require 3+ hours per day of interdisciplinary therapy in a comprehensive inpatient rehab setting.  Physiatrist is providing close team supervision and 24 hour management of active medical problems listed below.  Physiatrist and rehab team continue to assess barriers to discharge/monitor patient progress toward functional and medical goals  Care Tool:  Bathing    Body parts bathed by patient: Right arm,Left  arm,Chest,Abdomen,Right upper leg,Left upper leg,Right lower leg,Left lower leg,Face,Front perineal area   Body parts bathed by helper: Buttocks Body parts n/a: Front perineal area,Buttocks   Bathing assist Assist Level: Minimal Assistance - Patient > 75%     Upper Body Dressing/Undressing Upper body dressing   What is the patient wearing?: Pull over shirt    Upper body assist Assist Level: Minimal Assistance - Patient > 75%    Lower Body Dressing/Undressing Lower body dressing      What is the patient wearing?: Pants,Incontinence brief     Lower body assist Assist for lower body dressing: Minimal Assistance - Patient > 75%     Toileting Toileting    Toileting assist Assist for toileting: Moderate Assistance - Patient 50 - 74%     Transfers Chair/bed transfer  Transfers assist     Chair/bed transfer assist level: Contact Guard/Touching assist     Locomotion Ambulation   Ambulation assist      Assist level: Contact Guard/Touching assist Assistive device: Walker-rolling Max distance: 150   Walk 10 feet activity   Assist     Assist level: Contact Guard/Touching assist Assistive device: Walker-rolling   Walk 50 feet activity   Assist    Assist level: Contact Guard/Touching assist Assistive device: Walker-rolling    Walk 150 feet activity   Assist    Assist level: Contact Guard/Touching assist Assistive device: Walker-rolling    Walk 10 feet on uneven surface  activity   Assist     Assist level: Minimal Assistance - Patient > 75%  Assistive device: Hand held assist   Wheelchair     Assist Will patient use wheelchair at discharge?: No             Wheelchair 50 feet with 2 turns activity    Assist            Wheelchair 150 feet activity     Assist          Blood pressure (!) 107/51, pulse 64, temperature 97.9 F (36.6 C), resp. rate 14, height 5\' 10"  (1.778 m), weight 69 kg, SpO2 98 %.    Medical  Problem List and Plan: 1.  Progressive weakness with altered mental status secondary to nontraumatic left SDH             -patient may shower             -ELOS/Goals: 10-14 days/supervision/min a             Continue CIR 2.  Antithrombotics: -DVT/anticoagulation: SCDs             -antiplatelet therapy: N/A 3. Pain Management: Tylenol as needed 4. Mood: Aricept 10 mg daily             -antipsychotic agents: N/A 5. Neuropsych: This patient is not capable of making decisions on his own behalf. 6. Skin/Wound Care: Routine skin checks 7. Fluids/Electrolytes/Nutrition: Routine in and outs             CMP reviewed, significant for hypoglycemia on 2/10.  8.  Dysphagia.  Dysphagia #1 thin liquid diet             Advance diet as tolerated  Discussed incorporating high protein foods as much as possible.  9.  Seizure prophylaxis.   -keppra decreased to 500mg  daily starting 2/12 10.  Myelodysplastic syndrome/thrombocytopenia.  Latest platelet count 52,000.  Follow-up outpatient Dr. Irene Limbo.  Continue Amicar 1250 mg 3 times daily  -transfuse for platelet ct <20k             2/13 Plt 27>22>17k today   -transfuse today 11. Hypothyroidism: Synthroid 12.  BPH/urinary retention/hematuria.    Flomax 0.4 mg nightly.               still req I/O caths, which have often been traumatic   -foley replaced last night 2/12   -dc urecholine for now 13. Anemia: Hgb reviewed, 8.0 on 2/11, 7.8 2/12 and 2/13      LOS: 4 days A FACE TO [place] 03/22/2020, 8:11 AM

## 2020-03-23 LAB — PREPARE PLATELET PHERESIS
Unit division: 0
Unit division: 0

## 2020-03-23 LAB — BPAM PLATELET PHERESIS
Blood Product Expiration Date: 202202142359
Blood Product Expiration Date: 202202142359
ISSUE DATE / TIME: 202202131103
ISSUE DATE / TIME: 202202131451
Unit Type and Rh: 5100
Unit Type and Rh: 8400

## 2020-03-23 LAB — CBC
HCT: 23.3 % — ABNORMAL LOW (ref 39.0–52.0)
Hemoglobin: 7.7 g/dL — ABNORMAL LOW (ref 13.0–17.0)
MCH: 31.6 pg (ref 26.0–34.0)
MCHC: 33 g/dL (ref 30.0–36.0)
MCV: 95.5 fL (ref 80.0–100.0)
Platelets: 38 10*3/uL — ABNORMAL LOW (ref 150–400)
RBC: 2.44 MIL/uL — ABNORMAL LOW (ref 4.22–5.81)
RDW: 17.3 % — ABNORMAL HIGH (ref 11.5–15.5)
WBC: 3.5 10*3/uL — ABNORMAL LOW (ref 4.0–10.5)
nRBC: 0 % (ref 0.0–0.2)

## 2020-03-23 MED ORDER — ENSURE ENLIVE PO LIQD
237.0000 mL | Freq: Two times a day (BID) | ORAL | Status: DC
  Administered 2020-03-23 – 2020-03-28 (×10): 237 mL via ORAL

## 2020-03-23 NOTE — Progress Notes (Signed)
Occupational Therapy Session Note  Patient Details  Name: Shaun Hobbs MRN: [id number] Date of Birth: February 22, 1928  Today's Date: 03/23/2020 OT Individual Time: 1133-1206 OT Individual Time Calculation (min): 33 min    Short Term Goals: Week 1:  OT Short Term Goal 1 (Week 1): Pt will complete LB bathing with min assist sit to stand. OT Short Term Goal 2 (Week 1): Pt will complete UB dressing with supervision. OT Short Term Goal 3 (Week 1): Pt will complete LB dressing with min guard assist. OT Short Term Goal 4 (Week 1): Pt will complete toilet transfers with min guard assist.  Skilled Therapeutic Interventions/Progress Updates:    Treatment session with focus on cognition, sequencing, and recall with familiar card activity.  Pt received upright in w/c with wife present.  Pt reports pt typically enjoying playing solitaire and that they will do various puzzles together. Therapist provided pt with playing cards to which pt shuffled with increased time.  When encouraged to set up solitaire, pt picked up cards again and shuffled again before setting them down.  Therapist set cards out for solitaire with pt then requiring initial cues for sequencing of familiar activity. Pt then demonstrating good initiation and recall to complete multiple moves in a row.  Therapist providing mod cues to recall to place cards on Aces as appropriate.  Pt demonstrated good sustained attention to solitaire activity.  When therapist offered to let pt keep the cards, he declined.  Therapist encouraged wife to continue having pt complete familiar tasks for leisure as appropriate.  Pt remained upright in w/c with seat belt alarm on and all needs in reach.  Therapy Documentation Precautions:  Precautions Precautions: Fall,Other (comment) Precaution Comments: impaired awareness and delayed verbal response Restrictions Weight Bearing Restrictions: No General:   Vital Signs: Therapy Vitals Temp: 97.9 F (36.6 C) Temp  Source: Oral Pulse Rate: (!) 59 Resp: 16 BP: (!) 103/53 Patient Position (if appropriate): Sitting Oxygen Therapy SpO2: 100 % O2 Device: Room Air Pain:   ADL: ADL Eating: Dependent (Per spouse as pt does not initiate) Where Assessed-Eating: Edge of bed Grooming: Minimal assistance Where Assessed-Grooming: Standing at sink Upper Body Bathing: Minimal assistance Where Assessed-Upper Body Bathing: Edge of bed Lower Body Bathing: Moderate assistance Where Assessed-Lower Body Bathing: Edge of bed Upper Body Dressing: Minimal assistance Where Assessed-Upper Body Dressing: Edge of bed Lower Body Dressing: Moderate assistance Where Assessed-Lower Body Dressing: Edge of bed Toileting: Moderate assistance Where Assessed-Toileting: Bedside Commode Toilet Transfer: Minimal assistance Toilet Transfer Method: Counselling psychologist: Radiographer, therapeutic: Not assessed Vision   Perception    Praxis   Exercises:   Other Treatments:     Therapy/Group: Individual Therapy  Simonne Come 03/23/2020, 2:24 PM

## 2020-03-23 NOTE — Progress Notes (Signed)
[place] PHYSICAL MEDICINE & REHABILITATION PROGRESS NOTE   Subjective/Complaints:  Foley placed over the weekend  ROS: Limited due to cognitive/behavioral   Objective:   No results found. Recent Labs    03/22/20 0458 03/23/20 0551  WBC 2.7* 3.5*  HGB 7.8* 7.7*  HCT 24.3* 23.3*  PLT 17* 38*   No results for input(s): NA, K, CL, CO2, GLUCOSE, BUN, CREATININE, CALCIUM in the last 72 hours.  Intake/Output Summary (Last 24 hours) at 03/23/2020 0744 Last data filed at 03/22/2020 1849 Gross per 24 hour  Intake 1759 ml  Output --  Net 1759 ml        Physical Exam: Vital Signs Blood pressure 117/62, pulse 72, temperature 97.8 F (36.6 C), resp. rate 20, height 5\' 10"  (1.778 m), weight 69 kg, SpO2 97 %.  General: No acute distress Mood and affect are appropriate Heart: Regular rate and rhythm no rubs murmurs or extra sounds Lungs: Clear to auscultation, breathing unlabored, no rales or wheezes Abdomen: Positive bowel sounds, soft nontender to palpation, nondistended Extremities: No clubbing, cyanosis, or edema Skin: No evidence of breakdown, no evidence of rash  GU: foley in place, urine pink/orange in color Musculoskeletal:     Cervical back: Normal range of motion and neck supple.     Comments: No edema or tenderness in extremities  Skin:    General: Skin is warm and dry.  Neurological:     Mental Status: He is alert.   delayed. Follows simple commands    Assessment/Plan: 1. Functional deficits which require 3+ hours per day of interdisciplinary therapy in a comprehensive inpatient rehab setting.  Physiatrist is providing close team supervision and 24 hour management of active medical problems listed below.  Physiatrist and rehab team continue to assess barriers to discharge/monitor patient progress toward functional and medical goals  Care Tool:  Bathing    Body parts bathed by patient: Right arm,Left arm,Chest,Abdomen,Right upper leg,Left upper  leg,Right lower leg,Left lower leg,Face,Front perineal area   Body parts bathed by helper: Buttocks Body parts n/a: Front perineal area,Buttocks   Bathing assist Assist Level: Minimal Assistance - Patient > 75%     Upper Body Dressing/Undressing Upper body dressing   What is the patient wearing?: Pull over shirt    Upper body assist Assist Level: Minimal Assistance - Patient > 75%    Lower Body Dressing/Undressing Lower body dressing      What is the patient wearing?: Pants,Incontinence brief     Lower body assist Assist for lower body dressing: Minimal Assistance - Patient > 75%     Toileting Toileting    Toileting assist Assist for toileting: Moderate Assistance - Patient 50 - 74%     Transfers Chair/bed transfer  Transfers assist     Chair/bed transfer assist level: Contact Guard/Touching assist     Locomotion Ambulation   Ambulation assist      Assist level: Contact Guard/Touching assist Assistive device: Walker-rolling Max distance: 150   Walk 10 feet activity   Assist     Assist level: Contact Guard/Touching assist Assistive device: Walker-rolling   Walk 50 feet activity   Assist    Assist level: Contact Guard/Touching assist Assistive device: Walker-rolling    Walk 150 feet activity   Assist    Assist level: Contact Guard/Touching assist Assistive device: Walker-rolling    Walk 10 feet on uneven surface  activity   Assist     Assist level: Minimal Assistance - Patient > 75% Assistive device: Hand held  assist   Wheelchair     Assist Will patient use wheelchair at discharge?: No             Wheelchair 50 feet with 2 turns activity    Assist            Wheelchair 150 feet activity     Assist          Blood pressure 117/62, pulse 72, temperature 97.8 F (36.6 C), resp. rate 20, height 5\' 10"  (1.778 m), weight 69 kg, SpO2 97 %.    Medical Problem List and Plan: 1.  Progressive weakness  with altered mental status secondary to nontraumatic left SDH             -patient may shower             -ELOS/Goals: 10-14 days/supervision/min a             Continue CIR PT, OT, SLP 2.  Antithrombotics: -DVT/anticoagulation: SCDs             -antiplatelet therapy: N/A 3. Pain Management: Tylenol as needed 4. Mood: Aricept 10 mg daily             -antipsychotic agents: N/A 5. Neuropsych: This patient is not capable of making decisions on his own behalf. 6. Skin/Wound Care: Routine skin checks 7. Fluids/Electrolytes/Nutrition: Routine in and outs             CMP reviewed, significant for hypoglycemia on 2/10.  8.  Dysphagia.  Dysphagia #1 thin liquid diet             Advance diet as tolerated  Discussed incorporating high protein foods as much as possible.  9.  Seizure prophylaxis.   -keppra decreased to 500mg  daily starting 2/12 10.  Myelodysplastic syndrome/thrombocytopenia.  Latest platelet count 52,000.  Follow-up outpatient Dr. Irene Limbo.  Continue Amicar 1250 mg 3 times daily  -transfuse for platelet ct <20k             2/13 Plt 17K, up to 38K 2/14  11. Hypothyroidism: Synthroid 12.  BPH/urinary retention/hematuria.    Flomax 0.4 mg nightly.  urine is blood tinged no Hgb drop              still req I/O caths, which have often been traumatic   -foley replaced last night 2/12   -dc urecholine for now 13. Anemia: Hgb reviewed, 8.0 on 2/11, 7.8 2/12 and 2/13, 7.7 on 2/14 stable       LOS: 5 days A FACE TO [place] [facility] 03/23/2020, 7:44 AM

## 2020-03-23 NOTE — Progress Notes (Signed)
Speech Language Pathology Daily Session Note  Patient Details  Name: Shaun Hobbs MRN: [id number] Date of Birth: 1928-07-27  Today's Date: 03/23/2020 SLP Individual Time: 0816-0900 SLP Individual Time Calculation (min): 44 min  Short Term Goals: Week 1: SLP Short Term Goal 1 (Week 1): Pt will demonstrate efficient mastication and oral clearance and minimal overt s/sx aspiration of Dys 3 textures X2 prior to advancement. SLP Short Term Goal 2 (Week 1): Pt will sustain attention to tasks in 3-5 minute intervals with Mod A cues for redirection. SLP Short Term Goal 3 (Week 1): Pt will orient to place and month with Mod A multimodal cues. SLP Short Term Goal 4 (Week 1): Pt will demonstrate ability to use external aids or other strategies to recall basic daily information with Max A cues. SLP Short Term Goal 5 (Week 1): Pt will demonstrate ability to problem solve basic familiar situations with Mod A multimodal cues. SLP Short Term Goal 6 (Week 1): Pt will demonstate ability to express basic needs and participate in naming tasks with overall Mod A for verbal initiation and/or word finding.  Skilled Therapeutic Interventions: Pt was seen for skilled ST targeting dysphagia and cognitive-linguistic goals. Pt's wife present for session. facilitated session with overall Mod faded to Min A multimodal cues for initiation of self-feeding Dys 2/thin liquids breakfast items. Min A verbal cues also provided for attention to self feeding and chewing, as he intermittently loses focus and ceases mastication with poor bolus awareness. Trace lingual residue noted after meal, but pt cleared with verbal cue to use liquid wash. Recommend continue current diet. Pt continues to required overall Max A verbal and visual cues for use of external aids in order to orient to place and time, as well as for recall and functional use of call bell for assistance. Pt with improvement in word finding and verbal initiation to answer  questions during structured picture description task. He described actions and objects with Min A contextual and semantic cues for awareness of verbal errors and word finding. He responded to ~80% of SLPs questions today, although extra time is required for responses due to delayed processing and decreased attention. Pt expressed his needs and preferences throughout session with overall Mod A question prompts. SLP Answered many questions from pt's wife regarding cognitive status and he concerns for course of recovery. Continue to provide skilled education regarding cognitive deficits in context of acute CVA and baseline Alzheimer's Dementia. Pt left laying in bed with alarm set and needs within reach. Continue per current plan of care.        Pain Pain Assessment Pain Scale: 0-10 Pain Score: 0-No pain  Therapy/Group: Individual Therapy  Arbutus Leas 03/23/2020, 7:21 AM

## 2020-03-23 NOTE — Progress Notes (Signed)
Physical Therapy Session Note  Patient Details  Name: Shaun Hobbs MRN: [id number] Date of Birth: [birth year]  Today's Date: 03/23/2020 PT Individual Time: 1445-1525 PT Individual Time Calculation (min): 40 min   Short Term Goals: Week 1:  PT Short Term Goal 1 (Week 1): Pt will perform supine<>sit with CGA PT Short Term Goal 2 (Week 1): Pt will perform sit<>stands using LRAD with CGA PT Short Term Goal 3 (Week 1): Pt will perform bed<>chair transfers using LRAD with CGA PT Short Term Goal 4 (Week 1): Pt will ambulate at least 196ft using LRAD with CGA PT Short Term Goal 5 (Week 1): Pt will step up/down 1 step using LRAD with CGA  Skilled Therapeutic Interventions/Progress Updates:    Patient received sitting up in bed, wife at bedside, agreeable to PT. He denies pain. PT donning shoes TotalA. Patient able to come sit edge of bed with CGA and use of bedrails. Patient ambulating to therapy gym with RW and light CGA. He maintains forward flexed posture and walker too far in front of him despite multimodal cues. Standing balance and attention tasks completed on BITS. He was able to attend to visual scanning dot task for ~2 mins at a time with max verbal cuing to continue task. Patient with NBOS in standing, but no LOB, able to reach outside base of support. Average response time of 5s. Patient completing shape tracing task, given the cue "trace the shape with your finger." Patient would then bisect shape instead of tracing it. Patient able to repeat cue that was given by PT confirming that he was meant to trace shape. Only when PT said "trace the black line" was patient able to accurately trace the shape. Patient ambulating back to his room with RW and light CGA returning to bed. Bed alarm on, call light within reach.   Therapy Documentation Precautions:  Precautions Precautions: Fall,Other (comment) Precaution Comments: impaired awareness and delayed verbal response Restrictions Weight Bearing  Restrictions: No    Therapy/Group: Individual Therapy  Karoline Caldwell, PT, DPT, CBIS  03/23/2020, 7:34 AM

## 2020-03-23 NOTE — Progress Notes (Signed)
Occupational Therapy Session Note  Patient Details  Name: Shaun Hobbs MRN: [id number] Date of Birth: 09/20/1928  Today's Date: 03/23/2020 OT Individual Time: 0906-1005 OT Individual Time Calculation (min): 59 min    Short Term Goals: Week 1:  OT Short Term Goal 1 (Week 1): Pt will complete LB bathing with min assist sit to stand. OT Short Term Goal 2 (Week 1): Pt will complete UB dressing with supervision. OT Short Term Goal 3 (Week 1): Pt will complete LB dressing with min guard assist. OT Short Term Goal 4 (Week 1): Pt will complete toilet transfers with min guard assist.  Skilled Therapeutic Interventions/Progress Updates:    Pt completed dressing sit to stand from the EOB with spouse present as well.  When presented with a shirt, he was able to orient it and then donn it with only setup.  He needed min assist for pants in order to donn the right leg with catheter bag threaded through as well.  Sit to stand and threading them over his hips was completed with min assist.  Therapist provided total assist for TEDs and then he donned his lace up shoes with min assist and increased time.  While sitting, he asked where the bathroom was and then completed functional mobility to the bathroom with min assist and use of the RW.  Mod demonstrational cueing was needed not to pull up on the walker but to push up from the arms of the wheelchair and from the EOB.  He was able to manage clothing with min assist but when asked if he needed to have a BM or urinate, he stated "urinate", not being aware that an indwelling catheter was in place.  Next, he ambulated out to the sink for washing hands and oral hygiene, but would not stay standing for tasks.  He instead sat in the wheelchair.  After resting he did stand to complete oral hygiene with setup of supplies and min guard.  He was able to apply toothpaste on his own as well as initiate and complete task when setup.  Finished session with pt in the wheelchair  and with the call button and phone in reach.  Safety belt in place.    Therapy Documentation Precautions:  Precautions Precautions: Fall,Other (comment) Precaution Comments: impaired awareness and delayed verbal response Restrictions Weight Bearing Restrictions: No   Pain: Pain Assessment Pain Scale: Faces Pain Score: 0-No pain ADL: See Care Tool Section for some details of mobility and selfcare  Therapy/Group: Individual Therapy  Jolee Critcher OTR/L 03/23/2020, 12:23 PM

## 2020-03-24 ENCOUNTER — Inpatient Hospital Stay: Payer: Medicare Other | Attending: Hematology

## 2020-03-24 ENCOUNTER — Inpatient Hospital Stay: Payer: Medicare Other

## 2020-03-24 LAB — URINALYSIS, ROUTINE W REFLEX MICROSCOPIC
Bilirubin Urine: NEGATIVE
Glucose, UA: NEGATIVE mg/dL
Ketones, ur: NEGATIVE mg/dL
Nitrite: POSITIVE — AB
Protein, ur: 30 mg/dL — AB
Specific Gravity, Urine: 1.023 (ref 1.005–1.030)
pH: 5 (ref 5.0–8.0)

## 2020-03-24 NOTE — Progress Notes (Signed)
Occupational Therapy Session Note  Patient Details  Name: Shaun Hobbs MRN: [id number] Date of Birth: 1929/01/31  Today's Date: 03/24/2020 OT Individual Time: 0905-1003 OT Individual Time Calculation (min): 58 min    Short Term Goals: Week 1:  OT Short Term Goal 1 (Week 1): Pt will complete LB bathing with min assist sit to stand. OT Short Term Goal 2 (Week 1): Pt will complete UB dressing with supervision. OT Short Term Goal 3 (Week 1): Pt will complete LB dressing with min guard assist. OT Short Term Goal 4 (Week 1): Pt will complete toilet transfers with min guard assist.  Skilled Therapeutic Interventions/Progress Updates:    Pt in bed to start with spouse present.  Worked on bathing and dressing during session with supervision for supine to sit EOB.  He was then able to complete transfer from the EOB to the shower with min assist using the RW for support.  Mod instructional cueing for hand placement on the bed for sit to stand with one LOB posteriorly noted during mobility.  He needed max assist to remove LB clothing secondary to decreased initiation and having catheter placed.  Next, he was able to complete shower but needed max demonstrational step by step cueing for initiation of all parts and for thoroughness with bathing.  Once he dried off with max instructional cueing, he ambulated out to the wheelchair at bedside for dressing.  Max assist for threading his brief over his feet and through the catheter with min assist for standing to pull them up over his hips.  He was then able to complete donning his pants with mod assist as well.  Therapist assisted with TEDs at total assist with pt donning and tying his shoes with only min facilitation.  Left him sitting up in the wheelchair at end of session with the call button and phone in reach.    Therapy Documentation Precautions:  Precautions Precautions: Fall,Other (comment) Precaution Comments: impaired awareness and delayed verbal  response Restrictions Weight Bearing Restrictions: No  Pain: Pain Assessment Pain Scale: 0-10 Pain Score: 0-No pain ADL: See Care Tool Section for some details of mobility and selfcare  Therapy/Group: Individual Therapy  Colonel Krauser OTR/L 03/24/2020, 12:34 PM

## 2020-03-24 NOTE — Progress Notes (Addendum)
Talked to therapy, says wife concerned that pt not processing as quick and seems to be a little slower. Called PA, rec'd new orders if needed, however, PA to discuss with wife for concerns.   New orders for U/A w/ C&S

## 2020-03-24 NOTE — Progress Notes (Signed)
Speech Language Pathology Daily Session Note  Patient Details  Name: Shaun Hobbs MRN: [id number] Date of Birth: 06/11/1928  Today's Date: 03/24/2020 SLP Individual Time: 0086-7619 SLP Individual Time Calculation (min): 42 min  Short Term Goals: Week 1: SLP Short Term Goal 1 (Week 1): Pt will demonstrate efficient mastication and oral clearance and minimal overt s/sx aspiration of Dys 3 textures X2 prior to advancement. SLP Short Term Goal 2 (Week 1): Pt will sustain attention to tasks in 3-5 minute intervals with Mod A cues for redirection. SLP Short Term Goal 3 (Week 1): Pt will orient to place and month with Mod A multimodal cues. SLP Short Term Goal 4 (Week 1): Pt will demonstrate ability to use external aids or other strategies to recall basic daily information with Max A cues. SLP Short Term Goal 5 (Week 1): Pt will demonstrate ability to problem solve basic familiar situations with Mod A multimodal cues. SLP Short Term Goal 6 (Week 1): Pt will demonstate ability to express basic needs and participate in naming tasks with overall Mod A for verbal initiation and/or word finding.  Skilled Therapeutic Interventions: Pt was seen for skilled ST targeting cognitive-linguistic goals. SLP facilitated session with Min A cues for initiation of self feeding and use of safe swallowing strategies during consumption of current diet Dys 2/thin liquid breakfast. He also required Mod A verbal and tactile cues for problem solving during self feeding. He was noted to orally expectorate a small piece of ground sausage from his plate, but otherwise his mastication and oral clearance was effective. Now verbal cues were required for use of intermittent liquid washes for clearance of trace residue due to overall weak manipulation of boluses, and to sustain attention to chewing. Pt began to fatigue during PO intake, requiring cues for initiation and attention to task to increase to Mod A ~1/2 way through session. Pt  with intermittent cough throughout session with and without POs - did not appear linked to PO intake. Recommend continue current diet. Pt used calendar to orient to date with Min A verbal and visual cueing today. Although able to read orientation sign in his room, he did not assign meaning to it/recognize "[place]" is where he is. Sign modified in attempt to better orient pt to place and situation. Pt left sitting in bed with alarm set and needs within reach. Continue per current plan of care.        Pain Pain Assessment Pain Scale: 0-10 Pain Score: 0-No pain  Therapy/Group: Individual Therapy  Arbutus Leas 03/24/2020, 7:20 AM

## 2020-03-24 NOTE — Progress Notes (Signed)
Physical Therapy Session Note  Patient Details  Name: Shaun Hobbs MRN: [id number] Date of Birth: 1928-05-13  Today's Date: 03/24/2020 PT Individual Time: 4196-2229 PT Individual Time Calculation (min): 60 min   Short Term Goals: Week 1:  PT Short Term Goal 1 (Week 1): Pt will perform supine<>sit with CGA PT Short Term Goal 2 (Week 1): Pt will perform sit<>stands using LRAD with CGA PT Short Term Goal 3 (Week 1): Pt will perform bed<>chair transfers using LRAD with CGA PT Short Term Goal 4 (Week 1): Pt will ambulate at least 120ft using LRAD with CGA PT Short Term Goal 5 (Week 1): Pt will step up/down 1 step using LRAD with CGA  Skilled Therapeutic Interventions/Progress Updates:    Pt received sitting in recliner with his wife present and pt asleep - easily awakens and initially upon encouraging to participate in therapy session he declines stating "no" - with encouragement from pt's wife and therapist., pt agreeable. Pt continues to demonstrate significant delay in verbal response to therapist's questions; however, pt able to respond with increased vocabulary compared to at initial evaluation when only response was "I don't know." Sit>stand recliner>RW requiring max cuing for sequencing (leaning forward and placing  BUEs on armrests to assist with pushing to stand) and significantly increased time to initiate movement with mod assist for lifting to stand and balance due to posterior lean - pt spontaneously returned to sitting requiring 2nd attempt prior to being ready to start ambulating. Gait training ~148ft to ADL apartment using RW with CGA/min assist for balance - demos increased trunk flexion excessively forward AD management and decreased gait speed with decreased B LE step lengths and foot clearances - cuing for improvement. In ADL apartment focused on familiar tasks with addition of dual-task challenge. Sitting in chair asked pt to name the following 3 fruit: orange, banana,  strawberry With cuing (color of fruit) pt able to correctly name orange, called the banana an apple 2x, and unable to identify strawberries despite cues - reviewed these 3 fruit at least 3x with pt still having difficulty verbally stating their names. Transitioned to dual-task challenge of having pt ambulate in kitchen using RW to locate the fruit when called out by therapist - pt is able to more easily identify the fruit when therapist calls out their names compared to him having to recall their names when being shown the fruit. Pt required min assist for balance ambulating in kitchen using RW demonstrating further decreased BLE foot clearance and step lengths. Gait training ~144ft using RW with CGA/min assist for balance and cuing for increased speed of ambulation, improved upright posture, and increased step lengths. Performed B UE and B LE reciprocal movement pattern retraining using Nustep against level 2 resistance for 14min totaling 153steps - attempted to use music to assist pt in maintaining a specific rate of movement; however, not effective. Gait training using RW back to kitchen to work on pt's recall of earlier task of having pt identify names of fruits - able to correctly identify and verbalize orange and banana but not strawberry - made 1x mistake when having to find correct fruit on verbal command. Gait training ~167ft back to room using RW with CGA for steadying and cuing for improved upright posture and step lengths, improving. At end of session pt unable to recall events of session. Pt's wife expresses concern that she feels that pt has had a "regression" in the past "2 hours" with more of a "blank stare" and more  exaggerated of delay in response to her questions. Mosie Epstein, RN aware of pt's wife's concerns. Pt left sitting in recliner with needs in reach, seat belt alarm on, and pt's wife present.   Therapy Documentation Precautions:  Precautions Precautions: Fall,Other  (comment) Precaution Comments: impaired awareness and delayed verbal response Restrictions Weight Bearing Restrictions: No  Pain: No reports of pain throughout session.   Therapy/Group: Individual Therapy  Tawana Scale , PT, DPT, CSRS  03/24/2020, 12:05 PM

## 2020-03-24 NOTE — Progress Notes (Signed)
PHYSICAL MEDICINE & REHABILITATION PROGRESS NOTE   Subjective/Complaints:  Discussed swallowing with SLP, no signs of dysphagia on curren tdiet, poor intake but drank Ensure PTA as well   ROS: Limited due to cognitive/behavioral   Objective:   No results found. Recent Labs    03/22/20 0458 03/23/20 0551  WBC 2.7* 3.5*  HGB 7.8* 7.7*  HCT 24.3* 23.3*  PLT 17* 38*   No results for input(s): NA, K, CL, CO2, GLUCOSE, BUN, CREATININE, CALCIUM in the last 72 hours.  Intake/Output Summary (Last 24 hours) at 03/24/2020 0736 Last data filed at 03/24/2020 0504 Gross per 24 hour  Intake 337 ml  Output 875 ml  Net -538 ml        Physical Exam: Vital Signs Blood pressure (!) 112/54, pulse 68, temperature 97.9 F (36.6 C), temperature source Oral, resp. rate 16, height 5\' 10"  (1.778 m), weight 69 kg, SpO2 96 %.  General: No acute distress Mood and affect are appropriate Heart: Regular rate and rhythm no rubs murmurs or extra sounds Lungs: Clear to auscultation, breathing unlabored, no rales or wheezes Abdomen: Positive bowel sounds, soft nontender to palpation, nondistended Extremities: No clubbing, cyanosis, or edema Skin: No evidence of breakdown, no evidence of rash  GU: foley in place, urine pink/orange in color Musculoskeletal:     Cervical back: Normal range of motion and neck supple.     Comments: No edema or tenderness in extremities  Skin:    General: Skin is warm and dry.  Neurological:     Mental Status: He is alert.   delayed. Follows simple commands    Assessment/Plan: 1. Functional deficits which require 3+ hours per day of interdisciplinary therapy in a comprehensive inpatient rehab setting.  Physiatrist is providing close team supervision and 24 hour management of active medical problems listed below.  Physiatrist and rehab team continue to assess barriers to discharge/monitor patient progress toward functional and medical goals  Care  Tool:  Bathing    Body parts bathed by patient: Right arm,Left arm,Chest,Abdomen,Right upper leg,Left upper leg,Right lower leg,Left lower leg,Face,Front perineal area   Body parts bathed by helper: Buttocks Body parts n/a: Front perineal area,Buttocks   Bathing assist Assist Level: Minimal Assistance - Patient > 75%     Upper Body Dressing/Undressing Upper body dressing   What is the patient wearing?: Pull over shirt    Upper body assist Assist Level: Supervision/Verbal cueing    Lower Body Dressing/Undressing Lower body dressing      What is the patient wearing?: Pants,Incontinence brief     Lower body assist Assist for lower body dressing: Minimal Assistance - Patient > 75%     Toileting Toileting    Toileting assist Assist for toileting: Minimal Assistance - Patient > 75%     Transfers Chair/bed transfer  Transfers assist     Chair/bed transfer assist level: Minimal Assistance - Patient > 75%     Locomotion Ambulation   Ambulation assist      Assist level: Contact Guard/Touching assist Assistive device: Hand held assist Max distance: 148ft   Walk 10 feet activity   Assist     Assist level: Contact Guard/Touching assist Assistive device: Walker-rolling   Walk 50 feet activity   Assist    Assist level: Contact Guard/Touching assist Assistive device: Walker-rolling    Walk 150 feet activity   Assist    Assist level: Contact Guard/Touching assist Assistive device: Walker-rolling    Walk 10 feet on uneven surface  activity   Assist     Assist level: Minimal Assistance - Patient > 75% Assistive device: Hand held assist   Wheelchair     Assist Will patient use wheelchair at discharge?: No             Wheelchair 50 feet with 2 turns activity    Assist            Wheelchair 150 feet activity     Assist          Blood pressure (!) 112/54, pulse 68, temperature 97.9 F (36.6 C), temperature source  Oral, resp. rate 16, height 5\' 10"  (1.778 m), weight 69 kg, SpO2 96 %.    Medical Problem List and Plan: 1.  Progressive weakness with altered mental status secondary to nontraumatic left SDH             -patient may shower             -ELOS/Goals: 10-14 days/supervision/min a             Continue CIR PT, OT, SLP 2.  Antithrombotics: -DVT/anticoagulation: SCDs             -antiplatelet therapy: N/A 3. Pain Management: Tylenol as needed 4. Mood: Aricept 10 mg daily             -antipsychotic agents: N/A 5. Neuropsych: This patient is not capable of making decisions on his own behalf. 6. Skin/Wound Care: Routine skin checks 7. Fluids/Electrolytes/Nutrition: Routine in and outs             CMP reviewed, significant for hypoglycemia on 2/10.  8.  Dysphagia.  Dysphagia #1 thin liquid diet             Advance diet as tolerated  Discussed incorporating high protein foods as much as possible.  9.  Seizure prophylaxis.   -keppra decreased to 500mg  daily starting 2/12 10.  Myelodysplastic syndrome/thrombocytopenia.  Latest platelet count 52,000.  Follow-up outpatient Dr. Irene Limbo.  Continue Amicar 1250 mg 3 times daily  -transfuse for platelet ct <20k             2/13 Plt 17K, up to 38K 2/14  11. Hypothyroidism: Synthroid 12.  BPH/urinary retention/hematuria.    Flomax 0.4 mg nightly.  urine is blood tinged no Hgb drop              still req I/O caths, which have often been traumatic   -foley replaced last night 2/12   -dc urecholine for now 13. Anemia: Hgb reviewed, 8.0 on 2/11, 7.8 2/12 and 2/13, 7.7 on 2/14 stable       LOS: 6 days A FACE TO [place] Haeley [place] 03/24/2020, 7:36 AM

## 2020-03-24 NOTE — Progress Notes (Signed)
Occupational Therapy Session Note  Patient Details  Name: Shaun Hobbs MRN: [id number] Date of Birth: 07-13-1928  Today's Date: 03/24/2020 OT Individual Time: 1035-1100 OT Individual Time Calculation (min): 25 min    Short Term Goals: Week 1:  OT Short Term Goal 1 (Week 1): Pt will complete LB bathing with min assist sit to stand. OT Short Term Goal 2 (Week 1): Pt will complete UB dressing with supervision. OT Short Term Goal 3 (Week 1): Pt will complete LB dressing with min guard assist. OT Short Term Goal 4 (Week 1): Pt will complete toilet transfers with min guard assist.  Skilled Therapeutic Interventions/Progress Updates:    Pt received sitting up in the w/c with his wife present. Pt agreeable to OT session. Pt with slow response and verbal output throughout session, very HOH. Pt taken via w/c to the kitchen. Pt completed sit > stand with min A using RW. Pt completed ambulatory transfer to the counter with mod cueing for positioning of the RW. He completed functional reaching task into the cabinet overhead to challenge dynamic standing balance and sequencing of familiar IADL task. Pt required mod cueing for initiation throughout task. He returned to sitting following, ~10 min of standing. He was able to sort utensils into proper categories with min cueing. Pt was returned to his room and left sitting in the recliner with all needs met , wife present and chair alarm set.   Therapy Documentation Precautions:  Precautions Precautions: Fall,Other (comment) Precaution Comments: impaired awareness and delayed verbal response Restrictions Weight Bearing Restrictions: No   Therapy/Group: Individual Therapy  Curtis Sites 03/24/2020, 6:31 AM

## 2020-03-24 NOTE — Progress Notes (Signed)
Foley clamped without urine at this time. Will pass on to night shift to get ua with c&s

## 2020-03-25 MED ORDER — CEPHALEXIN 250 MG PO CAPS
250.0000 mg | ORAL_CAPSULE | Freq: Three times a day (TID) | ORAL | Status: DC
  Administered 2020-03-25 – 2020-03-28 (×9): 250 mg via ORAL
  Filled 2020-03-25 (×9): qty 1

## 2020-03-25 NOTE — Progress Notes (Signed)
Physical Therapy Session Note  Patient Details  Name: Shaun Hobbs MRN: [id number] Date of Birth: 03-15-1928  Today's Date: 03/25/2020 PT Individual Time: 5916-3846 and 6599-3570 PT Individual Time Calculation (min): 32 min and 78 min  Short Term Goals: Week 1:  PT Short Term Goal 1 (Week 1): Pt will perform supine<>sit with CGA PT Short Term Goal 2 (Week 1): Pt will perform sit<>stands using LRAD with CGA PT Short Term Goal 3 (Week 1): Pt will perform bed<>chair transfers using LRAD with CGA PT Short Term Goal 4 (Week 1): Pt will ambulate at least 174ft using LRAD with CGA PT Short Term Goal 5 (Week 1): Pt will step up/down 1 step using LRAD with CGA  Skilled Therapeutic Interventions/Progress Updates:    Session 1: Pt received sitting in w/c as hand-off from [place], [place]. Pt agreeable to therapy session. Discussed with pt and wife regarding D/C plan with pt's wife expressing concerns of feeling that she cannot provide pt 24hr support at home without some assistance - wife has already spoken to [place], SW to receive a list of resources - wife reports that she prefers not to explore SNF options as pt has had a negative experience in that setting. Sit>stand w/c>RW with CGA for safety - pt continues to place both hands on AD during transfers with poor carryover/recall of safe hand placement. Gait training ~156ft to ADL apartment using RW with CGA for steadying - demos improved gait mechanics compared to yesterday though continues to push AD too far forward with excessive trunk flexion, decreased gait speed, and decreased step length - tactile cuing for improvement throughout. Again had pt participate in cognitive task while sitting to name/identify: orange, banana, strawberry, and apple. Pt becomes slightly perseverative on the word "pineapple" though none present - believe this came from trying to recall just the word "apple." Only named the fruit 1x then attempt to have pt perform dual-task to  ambulate in kitchen and collect each piece of fruit and try to name it; however, pt unable to name anything except "orange" when given cue to look at the color. Returned to sitting and had pt re-identify the fruit 2x and then returned to above task with pt able to correctly name 3/4 fruit (most difficulty noted throughout with naming "strawberry" although pt's wife reports they have spent many hours in their life picking strawberries). Pt able to ambulate throughout kitchen using RW with CGA for steadying and repeated cuing for improved upright posture and closer AD management. Gait training ~130ft back to room using RW with CGA - same cues as above. When turning to sit in w/c had to cue patient to use AD as he started to place it off to the side. Pt left sitting in w/c with needs in reach, lines intact, and seat belt alarm on.  Session 2: Pt received sitting in w/c napping with his wife present - upon awakening, pt agreeable to therapy session. Sit<>stands using RW with CGA throughout session - continued cuing for safe hand placement during transfers when using AD with no carryover. Gait training ~179ft to main therapy gym using RW with CGA - 1x pt suddenly crouches down and moves walker quickly appearing to be joking with another patient coming in his direction as though he was going to play bumper cars - continues to require cuing to maintain upright posture due to excessive trunk flexion. Gait training ~16ft, no AD, min assist for balance due to anterior weight shift - has wider BOS,  decreased B LE step lengths and foot clearances, and increased postural sway compared to when using RW. Ascended/descended 8 steps 2x (seated break between) using B HRs - reciprocal pattern - CGA for steadying/safety. Pt unaware of location or situation with therapist reorienting him to "[facility]" and "brain bleed" for simplification - after orienting 2x, pt able to recall location in 1 minute but unable to recall  after that.   Participated in dual-cognitive task of standing and sorting bean bags by color - pt was able to pair 2 bean bags of same color together and then started organizing the pairs in a pattern but had difficulty collecting more than 2 bags together until demonstrated and cued - during this, pt repeatedly and spontaneously would sit down demonstrating some dual-task cost and fatigue. Pt had difficulty naming red and purple colors though able to correctly group the colors. Progressed to having pt attempt to sequence 3 colors in order upon verbal command - pt repeatedly swapped the order of 2nd and 3rd color. Progressed this to having pt ambulate in small section of therapy gym using RW to locate the colored bean bags in the 3 color sequence upon verbal command - pt with difficulty visually scanning to locate them and still swaps 2nd and 3rd color in the sequence - CGA for steadying and max cuing for cognitive task - pt also repeatedly and spontaneously sits down on chairs/mats during this task.   Provided pt pictures of carrots, grapes, and Tide - oriented pt to each item and purpose of the "Tide" with pt able to recall its purpose after reviewing it a few times - asked pt which item he would not eat and pt unable to pick out the Tide without max cuing and explanation. Gait training ~140ft back to room using RW with CGA and continued cuing for upright posture. Pt left sitting in recliner with needs in reach, lines intact, seat belt alarm on, and his wife present.  Therapy Documentation Precautions:  Precautions Precautions: Fall,Other (comment) Precaution Comments: impaired awareness and delayed verbal response Restrictions Weight Bearing Restrictions: No  Pain: Session 1: No reports of pain throughout session.  Session 2: No reports of pain throughout session.   Therapy/Group: Individual Therapy  Tawana Scale , PT, DPT, CSRS  03/25/2020, 12:31 PM

## 2020-03-25 NOTE — Progress Notes (Signed)
Speech Language Pathology Daily Session Note  Patient Details  Name: Shaun Hobbs MRN: [id number] Date of Birth: 1928/10/03  Today's Date: 03/25/2020 SLP Individual Time: 0832-0927 SLP Individual Time Calculation (min): 55 min  Short Term Goals: Week 1: SLP Short Term Goal 1 (Week 1): Pt will demonstrate efficient mastication and oral clearance and minimal overt s/sx aspiration of Dys 3 textures X2 prior to advancement. SLP Short Term Goal 2 (Week 1): Pt will sustain attention to tasks in 3-5 minute intervals with Mod A cues for redirection. SLP Short Term Goal 3 (Week 1): Pt will orient to place and month with Mod A multimodal cues. SLP Short Term Goal 4 (Week 1): Pt will demonstrate ability to use external aids or other strategies to recall basic daily information with Max A cues. SLP Short Term Goal 5 (Week 1): Pt will demonstrate ability to problem solve basic familiar situations with Mod A multimodal cues. SLP Short Term Goal 6 (Week 1): Pt will demonstate ability to express basic needs and participate in naming tasks with overall Mod A for verbal initiation and/or word finding.  Skilled Therapeutic Interventions: Pt was seen for skilled ST targeting cognitive communication goals. Pt's wife was present for session. Pt with decreased verbal initiation in comparison to yesterday's session, as he only responded to ~30% of SLP's basic questions throughout session. He also required Max A multimodal cueing to use external aids in room to orient to place and date (more assistance than yesterday as well). SLP started a very basic memory notebook with orientation information and daily logs within it, as pt has demonstrated ability to read and this may be a compensatory strategy to assist with daily carryover and recall. Pt required Mod faded to Min A vebal and visual cueing for task initiation, problem solving, and error awareness when matching colored blocks to a 9-color pattern board. He also  sequenced playing cards from lowest to highest value with 100% accuracy provided Mod A verbal and visual cues for initiation, sustained attention to task, and Min A for problem solving. Discussed slow and fluctuating progress with pt's wife again today. Pt left laying in bed with alarm set and needs within reach. Continue per current plan of care.       Pain Pain Assessment Pain Scale: 0-10 Pain Score: 0-No pain  Therapy/Group: Individual Therapy  Arbutus Leas 03/25/2020, 7:18 AM

## 2020-03-25 NOTE — Progress Notes (Signed)
[facility] PHYSICAL MEDICINE & REHABILITATION PROGRESS NOTE   Subjective/Complaints:  Awake but sparse verbal output, follows simple commands for MMT  ROS: Limited due to cognitive/behavioral   Objective:   No results found. Recent Labs    03/23/20 0551  WBC 3.5*  HGB 7.7*  HCT 23.3*  PLT 38*   No results for input(s): NA, K, CL, CO2, GLUCOSE, BUN, CREATININE, CALCIUM in the last 72 hours.  Intake/Output Summary (Last 24 hours) at 03/25/2020 0730 Last data filed at 03/25/2020 0400 Gross per 24 hour  Intake 327 ml  Output 825 ml  Net -498 ml        Physical Exam: Vital Signs Blood pressure (!) 116/57, pulse 62, temperature 97.7 F (36.5 C), resp. rate 16, height _0  (1.778 m), weight 69 kg, SpO2 97 %.   General: No acute distress Mood and affect are appropriate Heart: Regular rate and rhythm no rubs murmurs or extra sounds Lungs: Clear to auscultation, breathing unlabored, no rales or wheezes Abdomen: Positive bowel sounds, soft nontender to palpation, nondistended Extremities: No clubbing, cyanosis, or edema Skin: No evidence of breakdown, no evidence of rash  GU: foley in place, urine pink/orange in color Musculoskeletal:     Cervical back: Normal range of motion and neck supple.     Comments: No edema or tenderness in extremities  Skin:    General: Skin is warm and dry.  Neurological:     Mental Status: He is alert.   delayed. Follows simple commands    Assessment/Plan: 1. Functional deficits which require 3+ hours per day of interdisciplinary therapy in a comprehensive inpatient rehab setting.  Physiatrist is providing close team supervision and 24 hour management of active medical problems listed below.  Physiatrist and rehab team continue to assess barriers to discharge/monitor patient progress toward functional and medical goals  Care Tool:  Bathing    Body parts bathed by patient: Right arm,Left arm,Chest,Abdomen,Right upper leg,Left  upper leg,Right lower leg,Left lower leg,Face,Front perineal area   Body parts bathed by helper: Buttocks Body parts n/a: Front perineal area,Buttocks   Bathing assist Assist Level: Minimal Assistance - Patient > 75%     Upper Body Dressing/Undressing Upper body dressing   What is the patient wearing?: Pull over shirt    Upper body assist Assist Level: Supervision/Verbal cueing    Lower Body Dressing/Undressing Lower body dressing      What is the patient wearing?: Pants,Incontinence brief     Lower body assist Assist for lower body dressing: Moderate Assistance - Patient 50 - 74%     Toileting Toileting    Toileting assist Assist for toileting: Minimal Assistance - Patient > 75%     Transfers Chair/bed transfer  Transfers assist     Chair/bed transfer assist level: Minimal Assistance - Patient > 75% Chair/bed transfer assistive device: Programmer, multimedia   Ambulation assist      Assist level: Minimal Assistance - Patient > 75% Assistive device: Walker-rolling Max distance: 116f   Walk 10 feet activity   Assist     Assist level: Minimal Assistance - Patient > 75% Assistive device: Walker-rolling   Walk 50 feet activity   Assist    Assist level: Minimal Assistance - Patient > 75% Assistive device: Walker-rolling    Walk 150 feet activity   Assist    Assist level: Contact Guard/Touching assist Assistive device: Walker-rolling    Walk 10 feet on uneven surface  activity   Assist  Assist level: Minimal Assistance - Patient > 75% Assistive device: Hand held assist   Wheelchair     Assist Will patient use wheelchair at discharge?: No             Wheelchair 50 feet with 2 turns activity    Assist            Wheelchair 150 feet activity     Assist          Blood pressure (!) 116/57, pulse 62, temperature 97.7 F (36.5 C), resp. rate 16, height _0  (1.778 m), weight 69 kg, SpO2 97  %.    Medical Problem List and Plan: 1.  Progressive weakness with altered mental status secondary to nontraumatic left SDH             -patient may shower             -ELOS/Goals: 10-14 days/supervision/min a             Continue CIR PT, OT, SLP, Team conference today please see physician documentation under team conference tab, met with team  to discuss problems,progress, and goals. Formulized individual treatment plan based on medical history, underlying problem and comorbidities.  2.  Antithrombotics: -DVT/anticoagulation: SCDs             -antiplatelet therapy: N/A 3. Pain Management: Tylenol as needed 4. Mood: Aricept 10 mg daily             -antipsychotic agents: N/A 5. Neuropsych: This patient is not capable of making decisions on his own behalf. 6. Skin/Wound Care: Routine skin checks 7. Fluids/Electrolytes/Nutrition: Routine in and outs             CMP reviewed, significant for hypoglycemia on 2/10.  8.  Dysphagia.  Dysphagia #1 thin liquid diet             Advance diet as tolerated  Discussed incorporating high protein foods as much as possible.  9.  Seizure prophylaxis.   -keppra decreased to 568m daily starting 2/12 10.  Myelodysplastic syndrome/thrombocytopenia.  Latest platelet count 52,000.  Follow-up outpatient Dr. KIrene Limbo  Continue Amicar 1250 mg 3 times daily  -transfuse for platelet ct <20k             2/13 Plt 17K, up to 38K 2/14  11. Hypothyroidism: Synthroid 12.  BPH/urinary retention/hematuria.    Flomax 0.4 mg nightly.  urine is blood tinged no Hgb drop              still req I/O caths, which have often been traumatic   -foley replaced last night 2/12   -dc urecholine for now 13. Anemia: Hgb reviewed, 8.0 on 2/11, 7.8 2/12 and 2/13, 7.7 on 2/14 stable       LOS: 7 days A FACE TO FACE EVALUATION WAS PERFORMED  ACharlett Blake2/16/2022, 7:30 AM

## 2020-03-25 NOTE — Progress Notes (Signed)
Patient ID: Shaun Hobbs, male   DOB: 1928/09/30, 87 y.o.   MRN: [id number]   SW spoke with spouse and family, family is not comfortable with foley care. Spouse can only provide supervision. Sw providing family with SNF, HC and HH options to begin discharge discussion. Family will update SW on d/c plan by end of the week.  [place], [place]

## 2020-03-25 NOTE — Progress Notes (Signed)
Palliative Medicine RN Note: Rec'd a call from pt's daughter Elyse Jarvis [phone]). She asked that PMT meet with them again. I explained that I need an MD order to do so, and that moving to CIR cancelled the previous order. At her request, I sent a message to his attending, Dr Letta Pate, with the family request.  Marjie Skiff. Jonavon Trieu, RN, BSN, [facility] Palliative Medicine Team 03/25/2020 1:06 PM Office [phone]

## 2020-03-25 NOTE — Progress Notes (Signed)
Patient ID: Shaun Hobbs, male   DOB: 1928/12/29, 87 y.o.   MRN: [id number] Team Conference Report to Patient/Family  Team Conference discussion was reviewed with the patient and caregiver, including goals, any changes in plan of care and target discharge date.  Patient and caregiver express understanding and are in agreement.  The patient has a target discharge date of 04/01/20.  Dyanne Iha 03/25/2020, 1:58 PM

## 2020-03-25 NOTE — Progress Notes (Signed)
Occupational Therapy Weekly Progress Note  Patient Details  Name: Shaun Hobbs MRN: [id number] Date of Birth: [birth year]  Beginning of progress report period: March 19, 2020 End of progress report period: March 25, 2020  Today's Date: 03/25/2020 OT Individual Time: 1350-1435 OT Individual Time Calculation (min): 45 min    Patient has met 3 of 4 short term goals.  Mr. Mangieri is making steady progress with OT at this time.  He continues to need min assist to min guard for transfers with use of the RW for support.  Max demonstrational cueing is needed for hand placement with sit to stand as he tends to try and pull up on the walker.  All bathing is currently at min assist level sit to stand with max step by step cueing for initiation and sequencing, including setup of the washcloth.  He is able to initiate and complete dressing with more efficiency when presented with clothing.  He was able to complete UB dressing with supervision and then completed LB dressing with overall min assist excluding TEDs, which require max assist.  He donns his shoes with setup including tying them.  Initiation and cognition are much more impaired secondary to baseline per pt's spouse.  Feel he is currently on target to meet supervision level goals, but will likely need greater supervision from a cognitive and physical standing.  Will continue with current OT POC with expected discharge on 2/23.      Patient continues to demonstrate the following deficits: muscle weakness, decreased initiation, decreased attention, decreased awareness, decreased problem solving, decreased safety awareness, decreased memory and delayed processing and decreased standing balance and decreased balance strategies and therefore will continue to benefit from skilled OT intervention to enhance overall performance with BADL and Reduce care partner burden.  Patient progressing toward long term goals..  Continue plan of care.  OT Short Term  Goals Week 2:  OT Short Term Goal 1 (Week 2): Continue working on established goals at supervision level.  Skilled Therapeutic Interventions/Progress Updates:    Pt sitting up in the recliner with spouse present to start session.  He was able to complete donning his shoes with setup including tying.  He then completed transfer to the wheelchair with min assist using the RW for support.  Mod demonstrational cueing for not pulling up on the walker and to push up from the arm rests of the chair.  Took pt down to the tub room for practice with stepping over the edge of the tub.  He was able to complete with use of the grab bars and min guard assist.  He was then taken to the gym where he sat in the wheelchair and worked on 15 piece jigsaw puzzle.  Therapist completed puzzle to start for demonstration as not picture of it was available.  He then initiated putting it together with mod questioning cueing to identify errors.  Finished session with return to the room via wheelchair where he was left with PT to take over session.  Pt with delayed or absent verbal output throughout session when asked basic questions such as "Do you have a tub like this at home?".  He did not respond.    Therapy Documentation Precautions:  Precautions Precautions: Fall,Other (comment) Precaution Comments: impaired awareness and delayed verbal response Restrictions Weight Bearing Restrictions: No   Pain: Pain Assessment Pain Scale: Faces Pain Score: 0-No pain ADL: See Care Tool Section for some details of mobility and selfcare  Therapy/Group: Individual Therapy  Monalisa Bayless OTR/L 03/25/2020, 3:55 PM

## 2020-03-25 NOTE — Progress Notes (Signed)
Physical Therapy Weekly Progress Note  Patient Details  Name: Shaun Hobbs MRN: [id number] Date of Birth: Nov 20, 1928  Beginning of progress report period: March 19, 2020 End of progress report period: March 25, 2020  Patient has met 5 of 5 short term goals. Mr. Aldrete is progressing well with functional mobility; however, continues to demonstrate significant cognitive impairments of delayed processing, impaired initiation, impaired awareness, and impaired memory that impact his safety with functional mobility. He is performing supine<>sit with CGA, sit<>stand and stand pivot transfers using RW with CGA, ambulating up to 169f using RW with CGA and ascending/descending stair using B HRs with CGA. Pt will benefit from continued CIR level therapies to address below impairments and decrease fall risk in preparation for upcoming D/C on 2/23 where pt will require 24hr support.  Patient continues to demonstrate the following deficits muscle weakness, decreased cardiorespiratoy endurance, impaired timing and sequencing,  , decreased initiation, decreased attention, decreased awareness, decreased problem solving, decreased safety awareness, decreased memory and delayed processing and decreased standing balance, decreased postural control and decreased balance strategies and therefore will continue to benefit from skilled PT intervention to increase functional independence with mobility.  Patient progressing toward long term goals.  Continue plan of care.  PT Short Term Goals Week 1:  PT Short Term Goal 1 (Week 1): Pt will perform supine<>sit with CGA PT Short Term Goal 1 - Progress (Week 1): Met PT Short Term Goal 2 (Week 1): Pt will perform sit<>stands using LRAD with CGA PT Short Term Goal 2 - Progress (Week 1): Met PT Short Term Goal 3 (Week 1): Pt will perform bed<>chair transfers using LRAD with CGA PT Short Term Goal 3 - Progress (Week 1): Met PT Short Term Goal 4 (Week 1): Pt will ambulate  at least 1020fusing LRAD with CGA PT Short Term Goal 4 - Progress (Week 1): Met PT Short Term Goal 5 (Week 1): Pt will step up/down 1 step using LRAD with CGA PT Short Term Goal 5 - Progress (Week 1): Met Week 2:  PT Short Term Goal 1 (Week 2): = to LTGs based on ELOS  Skilled Therapeutic Interventions/Progress Updates:  Ambulation/gait training;Community reintegration;DME/adaptive equipment instruction;Neuromuscular re-education;Psychosocial support;Stair training;UE/LE Strength taining/ROM;Balance/vestibular training;Discharge planning;Functional electrical stimulation;Pain management;Skin care/wound management;Therapeutic Activities;UE/LE Coordination activities;Cognitive remediation/compensation;Disease management/prevention;Functional mobility training;Patient/family education;Splinting/orthotics;Therapeutic Exercise;Visual/perceptual remediation/compensation   Therapy Documentation Precautions:  Precautions Precautions: Fall,Other (comment) Precaution Comments: impaired awareness and delayed verbal response Restrictions Weight Bearing Restrictions: No  CaTawana Scale PT, DPT, CSRS  03/25/2020, 6:42 PM

## 2020-03-25 NOTE — Patient Care Conference (Signed)
Inpatient RehabilitationTeam Conference and Plan of Care Update Date: 03/25/2020   Time: 10:33 AM    Patient Name: Shaun Hobbs      Medical Record Number: [id number]  Date of Birth: 15-Sep-1928 Sex: Male         Room/Bed: 4M03C/4M03C-01 Payor Info: Payor: MEDICARE / Plan: MEDICARE PART A AND B / Product Type: *No Product type* /    Admit Date/Time:  03/18/2020  3:38 PM  Primary Diagnosis:  Subdural hematoma [facility])  Hospital Problems: Principal Problem:   Subdural hematoma [facility])    Expected Discharge Date: Expected Discharge Date: 04/01/20  Team Members Present: Physician leading conference: Dr. Alysia Penna Care Coodinator Present: Dorien Chihuahua, RN, BSN, CRRN;Christina Sampson Goon, BSW Nurse Present: Isla Pence, RN PT Present: Estevan Ryder, PT OT Present: Clyda Greener, OT SLP Present: Jettie Booze, CF-SLP PPS Coordinator present : Gunnar Fusi, SLP     Current Status/Progress Goal Weekly Team Focus  Bowel/Bladder   Pt with coude catheter. Continent of bowel. LBM 03/23/2020  BMs every 3 days or less  Assess B/B every shift and PRN   Swallow/Nutrition/ Hydration   Dys 2 textures, thin liquids, mostly impacted by cognition, mild oral deficits  Supervision  tolerance current diet, sustained attention to PO intake and self-feeding   ADL's   supervision for UB selfcare, min assist for LB selfcare, min guard to min assist for transfers.  Decreased initiation and decreased sustained attention to tasks.  Decreased memory as well secondary to dementia.  supervision overall  selfcare retraining, cognitive retraining, balance retraining, DME education, pt/family education, therapeutic exercise   Mobility   supervision/CGA bed mobility, min assist/CGA sit<>stand and stand pivot transfers using RW, min assist gait up to 148ft using RW, min assist 4 steps using B HRs  supervision overall at ambulatory level  activity tolerance, pt/family education, transfer training, gait training,  stair navigation, standing balance, cognitive retraining, BLE strengthening   Communication   fluctuated with verbal initiation/responses, some word finding deficits and off the wall responses during structured tasks  Min A expression of basic wants and needs  verbal initiation, word finding during functional tasks   Safety/Cognition/ Behavioral Observations  Mod-Max A  Min-Mod  orientation with aids/strategies, sustained attention, recall basic biographical information and functional new info with aids, basic familiar problem solving, education   Pain   FACES scale 0  pain scale <3/10  Assess pain every shift and PRN   Skin   no breakdown  no new breakdown  Assess skin every shift and PRN     Discharge Planning:  D/C home with spouse 1 level home, 1 step to enter   Team Discussion: Premorbid alzheimer's on Aricept and admitted for AMS with general weakness, found to have SDH and on seizure prophylaxis. Present like post brain injury; no focal weakness noted. Progress limited by age, cognitive deficits, weakness, etc. Incontinent of bowel and urinary retention unsuccessfully treated with flomax and required re-insertion of foley catheter.  Patient on target to meet rehab goals: yes, currently requires supervision for upper body care and min assist for lower body care with max instructional cues for sequencing especially with showering. Patient is better with dressing/automatically completes task however struggles in the showering activity. Requires min assist for transfers and cues for hand placement on the walker. Patient able to communicate using basic level verbal responses and follow directions. Tolerating Dysphagia 2 thin liquid diet.  *See Care Plan and progress notes for long and short-term goals.   Revisions  to Treatment Plan:  Word finding, word initiation, awareness and attention exercises added  Teaching Needs: Sequencing assistance and reminders to stay on tasks, transfers,  medications, etc.  Current Barriers to Discharge: Decreased caregiver support, Incontinence and history of Alzheimer's  Possible Resolutions to Barriers: Family education HH follow up services recommended (continue therapy in a more familiar environment)     Medical Summary Current Status: incont bowel and bladder             I attest that I was present, lead the team conference, and concur with the assessment and plan of the team.   Dorien Chihuahua B 03/25/2020, 2:31 PM

## 2020-03-25 NOTE — Progress Notes (Signed)
Wife concerned about foley and if would be removed once UTI resolves. Advised that up to physician, however, pt has had several catheters with unsuccessful voiding trials. Most likely will be discharged with catheter and will have to follow up with urology.  Informed her that will need to follow up with physician for plan at time of discharge.

## 2020-03-25 NOTE — Progress Notes (Signed)
Patient ID: Shaun Hobbs, male   DOB: 1928-10-30, 87 y.o.   MRN: [id number]   Per family request, patient Palliative orders have been submitted by PA. Gregary Signs from Palliative will visit family tomorrow for continuing services.   [place], [place]

## 2020-03-26 DIAGNOSIS — Z515 Encounter for palliative care: Secondary | ICD-10-CM

## 2020-03-26 DIAGNOSIS — G309 Alzheimer's disease, unspecified: Secondary | ICD-10-CM

## 2020-03-26 DIAGNOSIS — Z7189 Other specified counseling: Secondary | ICD-10-CM

## 2020-03-26 DIAGNOSIS — F0280 Dementia in other diseases classified elsewhere without behavioral disturbance: Secondary | ICD-10-CM

## 2020-03-26 LAB — TYPE AND SCREEN
ABO/RH(D): A POS
Antibody Screen: NEGATIVE
Donor AG Type: NEGATIVE
Donor AG Type: NEGATIVE
Unit division: 0
Unit division: 0

## 2020-03-26 LAB — CBC WITH DIFFERENTIAL/PLATELET
Abs Immature Granulocytes: 0 10*3/uL (ref 0.00–0.07)
Basophils Absolute: 0 10*3/uL (ref 0.0–0.1)
Basophils Relative: 0 %
Eosinophils Absolute: 0 10*3/uL (ref 0.0–0.5)
Eosinophils Relative: 0 %
HCT: 21.2 % — ABNORMAL LOW (ref 39.0–52.0)
Hemoglobin: 7.1 g/dL — ABNORMAL LOW (ref 13.0–17.0)
Immature Granulocytes: 0 %
Lymphocytes Relative: 36 %
Lymphs Abs: 0.9 10*3/uL (ref 0.7–4.0)
MCH: 32.3 pg (ref 26.0–34.0)
MCHC: 33.5 g/dL (ref 30.0–36.0)
MCV: 96.4 fL (ref 80.0–100.0)
Monocytes Absolute: 0.2 10*3/uL (ref 0.1–1.0)
Monocytes Relative: 6 %
Neutro Abs: 1.4 10*3/uL — ABNORMAL LOW (ref 1.7–7.7)
Neutrophils Relative %: 58 %
Platelets: 16 10*3/uL — CL (ref 150–400)
RBC: 2.2 MIL/uL — ABNORMAL LOW (ref 4.22–5.81)
RDW: 17.6 % — ABNORMAL HIGH (ref 11.5–15.5)
WBC: 2.4 10*3/uL — ABNORMAL LOW (ref 4.0–10.5)
nRBC: 0 % (ref 0.0–0.2)

## 2020-03-26 LAB — BPAM RBC
Blood Product Expiration Date: 202202212359
Blood Product Expiration Date: 202202212359
Unit Type and Rh: 6200
Unit Type and Rh: 6200

## 2020-03-26 MED ORDER — SODIUM CHLORIDE 0.9% IV SOLUTION: Freq: Once | INTRAVENOUS | Status: AC

## 2020-03-26 NOTE — Consult Note (Signed)
Consultation Note Date: 03/26/2020   Patient Name: Shaun Hobbs  DOB: May 07, 1928  MRN: [id number]  Age / Sex: 87 y.o., male  PCP: Chesley Noon, MD Referring Physician: Charlett Blake, MD  Reason for Consultation: Disposition and Establishing goals of care  HPI/Patient Profile: 87 y.o. male  with past medical history of myelodysplastic syndrome, Alzheimer's dementia, and hypothyroidism who initially presented to the emergency department on 03/08/20 with altered mental status and generalized weakness. In the ED, platelets were < 5 and CT scan revealed subdural hematoma. Neurosurgery did not recommend surgical intervention and patient was admitted to Internal Medicine teaching service for further management of SDH and acute encephalopathy. Hospital course was complicated by dysphagia and urinary retention. Patient was seen by PMT and goal of care was to improve functional status with the hope of returning home. Patient was admitted to CIR on 03/18/2020 for decreased functional mobility.   PMT was contacted 03/26/20 by patient's daughter [name] requesting we be re-consulted to assist with goals of care.    Clinical Assessment and Goals of Care: I have reviewed medical records including EPIC notes, labs and imaging, and met at bedside with wife [name]), daughter [name]), son Lorenda Ishihara), and daughter-in-law [name])  to discuss diagnosis, prognosis, GOC, EOL wishes, disposition, and options.  As far as functional status, family shares that patient has shown improvement in rehab and has continued to meet his goals. However, they are concerned that even with continued improvement he will never be close to his previous baseline. They are concerned that his quality of life has significantly decreased over the past few weeks.   We discussed at length the underlying myelodysplastic syndrome. Prior to admission, patient  had been maintained on transfusions every 2 weeks. Since he was initially admitted 03/08/20, he has required increased frequency of transfusions.  Review of the EMR reveals transfusion has been required on the following dates:  1/30 platelets 2 units  1/31 RBC 1 unit 2/04 platelets 1 unit, RBC 1 unit 2/07 platelets 1 unit  2/13 platelets 2 units  2/17 Platelets 2 units  Family shares again that when he was initially diagnosed with MDS, they were kindly informed by Dr. Irene Limbo that treatment is not curative. They feel that based on the increasing frequency of his transfusion requirements, they have unfortunately reached end-stage with this disease process.   The difference between aggressive medical intervention and comfort care was considered. From our first meeting on 03/08/20, family indicated they were receptive to a comfort approach if patient does not have an acceptable quality of life and if his decline from previous baseline seems due to a progressive disease process rather than a reversible issue. Reviewed with family the concept of a comfort path with focus on symptoms management rather than prolonging life. Family has already discussed amongst themselves and decided they are ready to stop transfusions at this point and transition to hospice care.   Discussed that given his prognosis of less than weeks upon stopping transfusions, he would be eligible  for residential hospice. Family requests referral to [facility].   Questions and concerns were addressed.  The family was encouraged to call with questions or concerns.   Primary decision maker: Wife [name] with support from daughter [name] and son Whit   SUMMARY OF RECOMMENDATIONS    DNR/DNI as previously documented  Continue current medical care  Transfer to [facility] when a bed is available (TOC order placed)  PMT will continue to follow  Code Status/Advance Care Planning:  DNR  Palliative Prophylaxis:   Aspiration and  Frequent Pain Assessment  Psycho-social/Spiritual:   Created space and opportunity for family to express thoughts and feelings regarding patient's current medical situation.   Emotional support provided   Prognosis:   < 2 weeks (after stopping transfusions)  Discharge Planning: Hospice facility      Primary Diagnoses: Present on Admission: . Subdural hematoma ([place])   I have reviewed the medical record, interviewed the patient and family, and examined the patient. The following aspects are pertinent.  Past Medical History:  Diagnosis Date  . Anemia   . Cancer ([place]) 2008   Myelodysplastic syndrome  . Dementia ([place])   . Hypothyroidism     History reviewed. No pertinent family history. Scheduled Meds: . aminocaproic acid  1,250 mg Oral TID  . cephALEXin  250 mg Oral Q8H  . Chlorhexidine Gluconate Cloth  6 each Topical Daily  . donepezil  10 mg Oral Daily  . feeding supplement  237 mL Oral BID BM  . levETIRAcetam  500 mg Oral Daily  . levothyroxine  100 mcg Oral QAC breakfast  . tamsulosin  0.4 mg Oral QHS   Continuous Infusions: PRN Meds:.acetaminophen Medications Prior to Admission:  Prior to Admission medications   Medication Sig Start Date End Date Taking? Authorizing Provider  aminocaproic acid (AMICAR) 25 % solution Take 5 mLs (1,250 mg total) by mouth 3 (three) times daily. 03/16/20   Gaylan Gerold, DO  Cholecalciferol (VITAMIN D3 PO) Take 1,000 Units by mouth daily.     [provider]  donepezil (ARICEPT) 10 MG tablet Take 10 mg by mouth daily.    [provider]  levETIRAcetam (KEPPRA) 750 MG tablet Take 1 tablet (750 mg total) by mouth 2 (two) times daily. 03/16/20   Gaylan Gerold, DO  levothyroxine (SYNTHROID, LEVOTHROID) 100 MCG tablet Take 100 mcg by mouth daily before breakfast.    [provider]  tamsulosin (FLOMAX) 0.4 MG CAPS capsule Take 0.4 mg by mouth at bedtime.    [provider]  triamcinolone lotion (KENALOG) 0.1  % Apply 1 application topically 3 (three) times daily. 02/25/20   Harle Stanford., PA-C   Allergies  Allergen Reactions  . Sulfa Antibiotics Other (See Comments)    Patient reports: reaction in past    Review of Systems  Unable to perform ROS: Dementia    Physical Exam Constitutional:      General: He is not in acute distress.    Comments: Appears frail  Pulmonary:     Effort: Pulmonary effort is normal.  Genitourinary:    Comments: Foley catheter present Neurological:     Mental Status: He is alert.  Psychiatric:        Cognition and Memory: Cognition is impaired.     Vital Signs: BP (!) 115/52 (BP Location: Right Arm)   Pulse 64   Temp 97.7 F (36.5 C) (Oral)   Resp 17   Ht '5\' 10"'  (1.778 m)   Wt 69 kg  SpO2 97%   BMI 21.83 kg/m  Pain Scale: Faces POSS *See Group Information*: 1-Acceptable,Awake and alert Pain Score: 0-No pain   SpO2: SpO2: 97 % O2 Device:SpO2: 97 % O2 Flow Rate: .   IO: Intake/output summary:   Intake/Output Summary (Last 24 hours) at 03/26/2020 1844 Last data filed at 03/26/2020 1342 Gross per 24 hour  Intake 251.23 ml  Output 325 ml  Net -73.77 ml    LBM: Last BM Date: 03/23/20 Baseline Weight: Weight: 69 kg Most recent weight: Weight: 69 kg      Palliative Assessment/Data: PPS 40%    Time In: 17:00 Time Out: 18:19 Time Total: 79 minutes Greater than 50%  of this time was spent counseling and coordinating care related to the above assessment and plan.  Signed by: Lavena Bullion, NP   Please contact Palliative Medicine Team phone at [phone] for questions and concerns.  For individual provider: See Shea Evans

## 2020-03-26 NOTE — Plan of Care (Signed)
  Problem: RH Swallowing Goal: LTG Patient will consume least restrictive diet using compensatory strategies with assistance (SLP) Description: LTG:  Patient will consume least restrictive diet using compensatory strategies with assistance (SLP) Flowsheets (Taken 03/26/2020 1230) LTG: Pt Patient will consume least restrictive diet using compensatory strategies with assistance of (SLP): Minimal Assistance - Patient > 75% Note: Downgraded due to less than anticipated progress   Problem: RH Problem Solving Goal: LTG Patient will demonstrate problem solving for (SLP) Description: LTG:  Patient will demonstrate problem solving for basic/complex daily situations with cues  (SLP) Flowsheets (Taken 03/26/2020 1230) LTG Patient will demonstrate problem solving for: Moderate Assistance - Patient 50 - 74% Note: Downgraded due to less than anticipated progress   Problem: RH Attention Goal: LTG Patient will demonstrate this level of attention during functional activites (SLP) Description: LTG:  Patient will will demonstrate this level of attention during functional activites (SLP) Flowsheets (Taken 03/26/2020 1230) LTG: Patient will demonstrate this level of attention during cognitive/linguistic activities with assistance of (SLP): Moderate Assistance - Patient 50 - 74% Number of minutes patient will demonstrate attention during cognitive/linguistic activities: 10 Note: Downgraded due to less than anticipated progress

## 2020-03-26 NOTE — Progress Notes (Signed)
Patient ID: Shaun Hobbs, male   DOB: 06/17/1928, 87 y.o.   MRN: [id number] Follow up with the wife regarding foley catheter care and urology follow up after discharge. REviewed catheter care and maintenance with the wife. Little apprehensive about the process and given handout for reference. Recommend the wife practice emptying the foley bag before the patient is discharged. States an understanding of the procedure and denies questions at present. Margarito Liner

## 2020-03-26 NOTE — Progress Notes (Signed)
First unit of platelets transfusion started at 0908. Pt tolerating well. No s/s of reaction noted. Vital signs at baseline.   Gerald Stabs, RN

## 2020-03-26 NOTE — Progress Notes (Addendum)
[facility] PHYSICAL MEDICINE & REHABILITATION PROGRESS NOTE   Subjective/Complaints:  PLT down today , as well as mild reduction in Hgb and WBC Pt remains diwsoriented but alert and able to follow simple commands   ROS: Limited due to cognitive/behavioral   Objective:   No results found. Recent Labs    03/26/20 0508  WBC 2.4*  HGB 7.1*  HCT 21.2*  PLT 16*   No results for input(s): NA, K, CL, CO2, GLUCOSE, BUN, CREATININE, CALCIUM in the last 72 hours.  Intake/Output Summary (Last 24 hours) at 03/26/2020 0740 Last data filed at 03/26/2020 0603 Gross per 24 hour  Intake 460 ml  Output 625 ml  Net -165 ml        Physical Exam: Vital Signs Blood pressure (!) 116/58, pulse 66, temperature 98.3 F (36.8 C), temperature source Oral, resp. rate 14, height 5\' 10"  (1.778 m), weight 69 kg, SpO2 95 %.   General: No acute distress Mood and affect are appropriate Heart: Regular rate and rhythm no rubs murmurs or extra sounds Lungs: Clear to auscultation, breathing unlabored, no rales or wheezes Abdomen: Positive bowel sounds, soft nontender to palpation, nondistended Extremities: No clubbing, cyanosis, or edema Skin: No evidence of breakdown, no evidence of rash  GU: foley in place, urine pink/orange in color Musculoskeletal:     Cervical back: Normal range of motion and neck supple.     Comments: No edema or tenderness in extremities  Skin:    General: Skin is warm and dry.  Neurological:     Mental Status: He is alert.   delayed. Follows simple commands    Assessment/Plan: 1. Functional deficits which require 3+ hours per day of interdisciplinary therapy in a comprehensive inpatient rehab setting.  Physiatrist is providing close team supervision and 24 hour management of active medical problems listed below.  Physiatrist and rehab team continue to assess barriers to discharge/monitor patient progress toward functional and medical goals  Care Tool:  Bathing     Body parts bathed by patient: Right arm,Left arm,Chest,Abdomen,Right upper leg,Left upper leg,Right lower leg,Left lower leg,Face,Front perineal area   Body parts bathed by helper: Buttocks Body parts n/a: Front perineal area,Buttocks   Bathing assist Assist Level: Minimal Assistance - Patient > 75%     Upper Body Dressing/Undressing Upper body dressing   What is the patient wearing?: Pull over shirt    Upper body assist Assist Level: Supervision/Verbal cueing    Lower Body Dressing/Undressing Lower body dressing      What is the patient wearing?: Pants,Incontinence brief     Lower body assist Assist for lower body dressing: Moderate Assistance - Patient 50 - 74%     Toileting Toileting    Toileting assist Assist for toileting: Minimal Assistance - Patient > 75%     Transfers Chair/bed transfer  Transfers assist     Chair/bed transfer assist level: Contact Guard/Touching assist Chair/bed transfer assistive device: Programmer, multimedia   Ambulation assist      Assist level: Contact Guard/Touching assist Assistive device: Walker-rolling Max distance: 147ft   Walk 10 feet activity   Assist     Assist level: Contact Guard/Touching assist Assistive device: Walker-rolling   Walk 50 feet activity   Assist    Assist level: Contact Guard/Touching assist Assistive device: Walker-rolling    Walk 150 feet activity   Assist    Assist level: Contact Guard/Touching assist Assistive device: Walker-rolling    Walk 10 feet on uneven surface  activity  Assist     Assist level: Minimal Assistance - Patient > 75% Assistive device: Hand held assist   Wheelchair     Assist Will patient use wheelchair at discharge?: No             Wheelchair 50 feet with 2 turns activity    Assist            Wheelchair 150 feet activity     Assist          Blood pressure (!) 116/58, pulse 66, temperature 98.3 F (36.8 C),  temperature source Oral, resp. rate 14, height 5\' 10"  (1.778 m), weight 69 kg, SpO2 95 %.    Medical Problem List and Plan: 1.  Progressive weakness with altered mental status secondary to nontraumatic left SDH             -patient may shower             -ELOS/Goals: 10-14 days/supervision/min a             Continue CIR PT, OT, SLP, discussed functional prognosis with pt's wife via phone on 2/16, have indicated that pt will likely need 24/7 supervision going forward and min A for certain tasks, wife indicates difficulty in providing physical assist   Hold PT< OT for plt <20 , may resume after plt transfusion 2.  Antithrombotics: -DVT/anticoagulation: SCDs             -antiplatelet therapy: N/A 3. Pain Management: Tylenol as needed 4. Mood: Aricept 10 mg daily             -antipsychotic agents: N/A 5. Neuropsych: This patient is not capable of making decisions on his own behalf. 6. Skin/Wound Care: Routine skin checks 7. Fluids/Electrolytes/Nutrition: Routine in and outs             CMP reviewed, significant for hypoglycemia on 2/10.  8.  Dysphagia.  Dysphagia #1 thin liquid diet             Advance diet as tolerated  Discussed incorporating high protein foods as much as possible.  9.  Seizure prophylaxis.   -keppra decreased to 500mg  daily starting 2/12 10.  Myelodysplastic syndrome/thrombocytopenia.  Latest platelet count 52,000.  Follow-up outpatient Dr. Irene Limbo.  Continue Amicar 1250 mg 3 times daily  -transfuse for platelet ct <20k             2/13 Plt 17K, up to 38K 2/14  Requiring plt transfusions ~2x per week, w2ife requesting Palliative Medicine to follow up with her  20. Hypothyroidism: Synthroid 12.  BPH/urinary retention/hematuria.    Flomax 0.4 mg nightly.  urine is blood tinged no Hgb drop              still req I/O caths, which have often been traumatic   -foley replaced last night 2/12   -dc urecholine for now 13. Anemia: Hgb reviewed, 8.0 on 2/11, 7.8 2/12 and 2/13, 7.7  on 2/14 stable       LOS: 8 days A FACE TO [place] E Alasha Mcguinness 03/26/2020, 7:40 AM

## 2020-03-26 NOTE — Progress Notes (Signed)
CRITICAL VALUE ALERT  Critical Value:  Platelets 16  Date & Time Notied:  03/26/2020 0630  Provider Notified: Silvestre Mesi PA  Orders Received/Actions taken: Order for platelets.

## 2020-03-26 NOTE — Progress Notes (Signed)
Occupational Therapy Session Note  Patient Details  Name: Shaun Hobbs MRN: [id number] Date of Birth: 25-Apr-1928  Today's Date: 03/26/2020 OT Individual Time: 1453-1540 OT Individual Time Calculation (min): 47 min    Short Term Goals: Week 2:  OT Short Term Goal 1 (Week 2): Continue working on established goals at supervision level.  Skilled Therapeutic Interventions/Progress Updates:    Pt in bed to start session with spouse present as well.  He needed min assist for transfer to the EOB secondary to trying to lay back down after coming up into long sitting.  He was then able to sit EOB with supervision.  Therapist assisted with donning his pants at mod assist level and TEDs and shoes at max assist level secondary to time.  Then had him complete stand pivot transfer to the wheelchair where he was then transported down to the ortho gym.  He completed 2 intervals of 5 mins using the Nustep with resistance on level 5.  Average number of steps ranged from 17-28 with occasional mod instructional cueing to continue going.  Once complete, he ambulated to the main gym with use of the RW and min assist.  Mod demonstrational cueing was needed for hand placement with sit to stand as he tends to place his hands on the walker and pull up for standing.  In the gym he sat in a chair and played catch with therapist, spontaneously catching a beach ball and tossing it back.  Therapist pulled over basketball goal where he would shot spontaneously as well when given the beach ball.  Ambulated back to the room with the RW and min assist to complete session.  He was left sitting in the recliner with safety belt in place and his spouse present for support.    Therapy Documentation Precautions:  Precautions Precautions: Fall,Other (comment) Precaution Comments: impaired awareness and delayed verbal response Restrictions Weight Bearing Restrictions: No  Pain: Pain Assessment Pain Scale: Faces Pain Score: 0-No  pain ADL: See Care Tool Section for some details of mobility and selfcare  Therapy/Group: Individual Therapy  Celest Reitz OTR/L 03/26/2020, 4:29 PM

## 2020-03-26 NOTE — Progress Notes (Signed)
[facility] ([facility]) Community Based Palliative Care       This patient has been referred to our palliative care services in the community.  [facility] will continue to follow for any discharge planning needs and to coordinate admission onto palliative care.    Thank you for the opportunity to participate in this patient's care.     Chrislyn King, BSN, RN [facility] Hospital Liaison   [phone] (24h on call) 

## 2020-03-26 NOTE — Progress Notes (Signed)
Speech Language Pathology Weekly Progress and Session Note  Patient Details  Name: Shaun Hobbs MRN: [id number] Date of Birth: 1929-01-16  Beginning of progress report period: March 19, 2020 End of progress report period: March 26, 2020  Today's Date: 03/26/2020 SLP Individual Time: 0730-0825 SLP Individual Time Calculation (min): 55 min  Short Term Goals: Week 1: SLP Short Term Goal 1 (Week 1): Pt will demonstrate efficient mastication and oral clearance and minimal overt s/sx aspiration of Dys 3 textures X2 prior to advancement. SLP Short Term Goal 1 - Progress (Week 1): Not progressing SLP Short Term Goal 2 (Week 1): Pt will sustain attention to tasks in 3-5 minute intervals with Mod A cues for redirection. SLP Short Term Goal 2 - Progress (Week 1): Met SLP Short Term Goal 3 (Week 1): Pt will orient to place and month with Mod A multimodal cues. SLP Short Term Goal 3 - Progress (Week 1): Met SLP Short Term Goal 4 (Week 1): Pt will demonstrate ability to use external aids or other strategies to recall basic daily information with Max A cues. SLP Short Term Goal 4 - Progress (Week 1): Met SLP Short Term Goal 5 (Week 1): Pt will demonstrate ability to problem solve basic familiar situations with Mod A multimodal cues. SLP Short Term Goal 5 - Progress (Week 1): Met SLP Short Term Goal 6 (Week 1): Pt will demonstate ability to express basic needs and participate in naming tasks with overall Mod A for verbal initiation and/or word finding. SLP Short Term Goal 6 - Progress (Week 1): Met    New Short Term Goals: Week 2: SLP Short Term Goal 1 (Week 2): STG=LTG due to remaining length of stay  Weekly Progress Updates: Pt has made very slow but some functional gains and met 5 out of 6 short term goals. Pt is currently Mod-Max assist for basic familiar tasks due to severe cognitive impairments impacting his short term and working memory, sustained attention, problem solving, verbal and  task initiation, and orientation. Pt's required level of assistance has also fluctuated throughout the last week, depending on level of initiation and attention that can change form day to day. Pt also requires fluctuating Min-Mod A verbal cueing for expression of his basic wants and needs and word finding, which is further complicated by his decreased verbal initiation. Pt is consuming a dysphagia 2 texture (minced/ground) diet with thin liquids and overall Min A verbal cues for use of safe swallowing strategies for oral clearance and bolus awareness/sustained attention to intake. Pt has demonstrated improved ability to use external aids to orient to time and place, as well as very basic familiar problem solving. Pt and family education is ongoing. Pt would continue to benefit from skilled ST while inpatient in order to maximize functional independence and reduce burden of care prior to discharge. Anticipate that pt will need 24/7 supervision at discharge in addition to [place] follow up at next level of care.     Intensity: Minumum of 1-2 x/day, 30 to 90 minutes Frequency: 3 to 5 out of 7 days Duration/Length of Stay: 04/01/20 Treatment/Interventions: Cognitive remediation/compensation;Cueing hierarchy;Dysphagia/aspiration precaution training;Functional tasks;Patient/family education;Therapeutic Activities;Speech/Language facilitation;Internal/external aids;Environmental controls   Daily Session  Skilled Therapeutic Interventions: Pt was seen for skilled ST targeting cognitive-linguistic skills. Pt demonstrated ability to use orientation aids for place and date within his memory notebook with overall Min A verbal and visual cueing today. However, Max A multimodal cueing was required for functional use of the daily log pages  to recall information from therapies. Provided Mod A verbal and visual cueing for problem solving and sustained attention to task, pt interpreted a basic weekly weather forecast. During a  basic sorting task, he matched picture of objects within the same semantic category with initially Min but increasing to Mod A during second half of activity, suspect due to decline in arousal and attention to task. Within sorting activity, he also exhibited semantic paraphasias and occasionally stated "I don't know" when asked to name objects in the picture tiles. So, although he could sort them accurately with mostly minimal assistance, naming was impaired. He verbally responded to ~85% of SLPs questions today, which was improved over yesterday. However, his verbalizations are mostly limited to 1-2 word utterances. Pt left laying in bed with alarm set and needs within reach. Continue per current plan of care.     Pain Pain Assessment Pain Scale: Faces Faces Pain Scale: No hurt  Therapy/Group: Individual Therapy  Arbutus Leas 03/26/2020, 9:52 AM

## 2020-03-26 NOTE — Progress Notes (Signed)
Physical Therapy Session Note  Patient Details  Name: Shaun Hobbs MRN: [id number] Date of Birth: 20-Sep-1928  Today's Date: 03/26/2020 PT Missed Time: 73 Minutes Missed Time Reason: MD hold (Comment) (on hold until he received 2 units of platelets)  Short Term Goals: Week 2:  PT Short Term Goal 1 (Week 2): = to LTGs based on ELOS  Skilled Therapeutic Interventions/Progress Updates:    Patient unable to participate in therapy on this date due to receiving 2 units of platelets and MD hold order.   Therapy Documentation Precautions:  Precautions Precautions: Fall,Other (comment) Precaution Comments: impaired awareness and delayed verbal response Restrictions Weight Bearing Restrictions: No    Therapy/Group: Individual Therapy  Karoline Caldwell, PT, DPT, CBIS  03/26/2020, 7:42 AM

## 2020-03-27 DIAGNOSIS — Z66 Do not resuscitate: Secondary | ICD-10-CM

## 2020-03-27 DIAGNOSIS — Z789 Other specified health status: Secondary | ICD-10-CM

## 2020-03-27 LAB — CBC WITH DIFFERENTIAL/PLATELET
Abs Immature Granulocytes: 0 10*3/uL (ref 0.00–0.07)
Basophils Absolute: 0 10*3/uL (ref 0.0–0.1)
Basophils Relative: 0 %
Eosinophils Absolute: 0 10*3/uL (ref 0.0–0.5)
Eosinophils Relative: 0 %
HCT: 22.8 % — ABNORMAL LOW (ref 39.0–52.0)
Hemoglobin: 7.3 g/dL — ABNORMAL LOW (ref 13.0–17.0)
Immature Granulocytes: 0 %
Lymphocytes Relative: 30 %
Lymphs Abs: 0.8 10*3/uL (ref 0.7–4.0)
MCH: 31.3 pg (ref 26.0–34.0)
MCHC: 32 g/dL (ref 30.0–36.0)
MCV: 97.9 fL (ref 80.0–100.0)
Monocytes Absolute: 0.2 10*3/uL (ref 0.1–1.0)
Monocytes Relative: 7 %
Neutro Abs: 1.7 10*3/uL (ref 1.7–7.7)
Neutrophils Relative %: 63 %
Platelets: 52 10*3/uL — ABNORMAL LOW (ref 150–400)
RBC: 2.33 MIL/uL — ABNORMAL LOW (ref 4.22–5.81)
RDW: 17.7 % — ABNORMAL HIGH (ref 11.5–15.5)
WBC: 2.8 10*3/uL — ABNORMAL LOW (ref 4.0–10.5)
nRBC: 0 % (ref 0.0–0.2)

## 2020-03-27 LAB — BPAM PLATELET PHERESIS
Blood Product Expiration Date: 202202182359
Blood Product Expiration Date: 202202182359
ISSUE DATE / TIME: 202202170855
ISSUE DATE / TIME: 202202171112
Unit Type and Rh: 9500
Unit Type and Rh: 9500

## 2020-03-27 LAB — PREPARE PLATELET PHERESIS
Unit division: 0
Unit division: 0

## 2020-03-27 MED ORDER — CEPHALEXIN 250 MG PO CAPS: 250.0000 mg | ORAL_CAPSULE | Freq: Three times a day (TID) | ORAL | Status: AC

## 2020-03-27 MED ORDER — ACETAMINOPHEN 325 MG PO TABS
650.0000 mg | ORAL_TABLET | Freq: Four times a day (QID) | ORAL | Status: AC | PRN
Start: 2020-03-27 — End: ?

## 2020-03-27 MED ORDER — LEVETIRACETAM 100 MG/ML PO SOLN: 500.0000 mg | Freq: Every day | ORAL | 12 refills | Status: AC

## 2020-03-27 NOTE — Progress Notes (Signed)
This chaplain responded to PMT consult for EOL spiritual care.  The Pt. wife-Shaun Hobbs is at the bedside. The Pt. is resting comfortably with periods of wakefulness and one attempt to get out of the bed.  The chaplain offered empathetic listening as Shaun Hobbs shared the family's cooperative roles on the Pt. 15 year healthcare path. The chaplain understands Shaun Hobbs feels supported by their children and is very appreciative of the PMT family meeting.  Shaun Hobbs is finding comfort from the booklet "Gone From My Sight" and is anticipating Hospice care as an opportunity to continue to love the Pt.  The chaplain offered F/U spiritual care as needed and intercessory prayer.

## 2020-03-27 NOTE — Discharge Summary (Signed)
Physician Discharge Summary  Patient ID: Shaun Hobbs MRN: [id number] DOB/AGE: [birth year] 87 y.o.  Admit date: 03/18/2020 Discharge date: 03/28/2020  Discharge Diagnoses:  Principal Problem:   Subdural hematoma ([place]) Failure to thrive Dementia Dysphagia Myelodysplastic syndrome/thrombocytopenia Seizure prophylaxis Hypothyroidism BPH with urinary retention/UTI Anemia COVID-19 2021  Discharged Condition: Guarded  Significant Diagnostic Studies: CT Head Wo Contrast  Result Date: 03/08/2020 CLINICAL DATA:  Mental status changes of unknown cause EXAM: CT HEAD WITHOUT CONTRAST TECHNIQUE: Contiguous axial images were obtained from the base of the skull through the vertex without intravenous contrast. Sagittal and coronal MPR images reconstructed from axial data set. COMPARISON:  None FINDINGS: Brain: Generalized atrophy. Normal ventricular morphology. No midline shift or mass effect. Small subdural hematoma identified along the LEFT tentorium, extending onto LEFT falx and anteriorly into the medial aspect of the LEFT middle cranial fossa. This measures up to 4 mm maximal thickness. Additional small amount of subdural blood is seen overlying the LEFT occipital lobe posteriorly. No evidence of acute infarction or mass lesion. No significant mass effect. Vascular: No hyperdense vessels. Atherosclerotic calcification of internal carotid arteries bilaterally at skull base Skull: Intact Sinuses/Orbits: Clear Other: N/A IMPRESSION: Small subdural hematoma along the LEFT tentorium, extending onto LEFT falx and anteriorly into the medial aspect of the LEFT middle cranial fossa as well as extending into the LEFT occipital region, measuring up to 4 mm thick. Additional small amount of subdural blood overlying the LEFT occipital lobe posteriorly. No significant mass effect or midline shift. Atrophy with small vessel chronic ischemic changes of deep cerebral white matter. No acute intraparenchymal abnormalities  identified. Critical Value/emergent results were called by telephone at the time of interpretation on 03/08/2020 at 11:08 am to provider DR. ANKIT NANAVATI , who verbally acknowledged these results. Electronically Signed   By: Lavonia Dana M.D.   On: 03/08/2020 11:08   DG Chest Portable 1 View  Result Date: 03/08/2020 CLINICAL DATA:  Altered mental status, fever, history dementia EXAM: PORTABLE CHEST 1 VIEW COMPARISON:  Portable exam 0923 hours compared to 12/12/2018 FINDINGS: Normal heart size, mediastinal contours, and pulmonary vascularity. Atherosclerotic calcification aorta. Lungs clear. No acute infiltrate, pleural effusion, or pneumothorax. Bones demineralized. IMPRESSION: No acute abnormalities. Aortic Atherosclerosis (ICD10-I70.0). Electronically Signed   By: Lavonia Dana M.D.   On: 03/08/2020 09:33   EEG adult  Result Date: 03/08/2020 Lora Havens, MD     03/08/2020  8:11 PM Patient Name: Shaun Hobbs MRN: [id number] Epilepsy Attending: Lora Havens Referring Physician/Provider: Dr Emelda Brothers Date: 03/08/2020 Duration: 22.56 mins Patient history: 87 y.o. man w/ MDS, PLT of 5 p/w AMS. [place] personally reviewed, which shows L convexity mixed density SDH with minimal brain compression, no midline shift. Had an episode of aphasia and some R sided shaking that resolved. EEG to evaluate for seizure Level of alertness: Awake AEDs during EEG study: None Technical aspects: This EEG study was done with scalp electrodes positioned according to the 10-20 International system of electrode placement. Electrical activity was acquired at a sampling rate of 500Hz  and reviewed with a high frequency filter of 70Hz  and a low frequency filter of 1Hz . EEG data were recorded continuously and digitally stored. Description: No posterior dominant rhythm was seen. EEG showed continuous generalized 3 to 6 Hz theta-delta slowing. Lateralized periodic discharges were noted at 1Hz  in left hemisphere.  Hyperventilation  and photic stimulation were not performed.   ABNORMALITY -Lateralized periodic discharges, left hemisphere -Continuous slow, generalized IMPRESSION: This study showed  evidence of epileptogenicity arising from left hemisphere due to underlying structural abnormality. Additionally there is moderate diffuse encephalopathy, nonspecific etiology. No seizures were seen throughout the recording. Primary team was notified. Shaun Hobbs   Overnight EEG with video  Result Date: 03/09/2020 Lora Havens, MD     03/10/2020  9:29 AM Patient Name: Shaun Hobbs MRN: [id number] Epilepsy Attending: Lora Havens Referring Physician/Provider: Dr Emelda Brothers Duration: 03/08/2020 1942 to 03/09/2020 1942  Patient history: 87 y.o.man w/ MDS, PLT of 5 p/w AMS.CTHpersonally reviewed, which shows L convexity mixed density SDH with minimal brain compression, no midline shift.Had an episode of aphasia and some R sided shaking that resolved. EEG to evaluate for seizure  Level of alertness: Awake, asleep  AEDs during EEG study: LEV  Technical aspects: This EEG study was done with scalp electrodes positioned according to the 10-20 International system of electrode placement. Electrical activity was acquired at a sampling rate of 500Hz  and reviewed with a high frequency filter of 70Hz  and a low frequency filter of 1Hz . EEG data were recorded continuously and digitally stored.  Description: No posterior dominant rhythm was seen. Sleep was characterized by sleep spindles (12 to 14 Hz), maximal frontocentral region.  EEG showed continuous generalized 3 to 6 Hz theta-delta slowing.  EEG initially showed lateralized periodic discharges at 1Hz  in left hemisphere.   Gradually EEG showed generalized periodic discharges with triphasic morphology at 1-1.5 Hz when patient was stimulated. Hyperventilation and photic stimulation were not performed.    ABNORMALITY -Lateralized periodic discharges, left hemisphere -Generalized  periodic discharges with triphasic morphology, generalized -Continuous slow, generalized  IMPRESSION: This study initially showed evidence of epileptogenicity arising from left hemisphere due to underlying structural abnormality. Gradually EEG showed generalized periodic discharges with triphasic morphology when patient was stimulated which is on the ictal-interictal continuum.  Additionally there is moderate diffuse encephalopathy, nonspecific etiology. No definite seizures were seen throughout the recording.  [place]:  Basic Metabolic Panel: No results for input(s): NA, K, CL, CO2, GLUCOSE, BUN, CREATININE, CALCIUM, MG, PHOS in the last 168 hours.  CBC: Recent Labs  Lab 03/21/20 0523 03/22/20 0458 03/23/20 0551 03/26/20 0508  WBC 3.0* 2.7* 3.5* 2.4*  NEUTROABS 1.6*  --   --  1.4*  HGB 7.8* 7.8* 7.7* 7.1*  HCT 24.6* 24.3* 23.3* 21.2*  MCV 98.4 97.2 95.5 96.4  PLT 22* 17* 38* 16*    CBG: No results for input(s): GLUCAP in the last 168 hours.  Brief HPI:   Shaun Hobbs is a 87 y.o. right-handed male with history of Covid 2021, myelodysplastic syndrome followed by Dr. Irene Limbo, Alzheimer's disease maintained on Aricept, hypothyroidism.  Patient lives with spouse reportedly independent prior to admission with decreased safety awareness.  Presented 03/08/2020 with altered mental status and generalized weakness.  Wife had noted generalized decline over the past week.  Cranial CT scan showed small SDH along the left tentorium extending onto left falx and anteriorly into the medial aspect of the middle cranial fossa as well as extending into the left occipital region measuring up to 4 mm in thickness.  Additional small amount of subdural blood overlying the left occipital lobe posteriorly.  No mass-effect or midline shift.  Admission chemistries lactic acid 0.9 sodium 128 glucose 122 hemoglobin 7.4 platelets less than 5 blood cultures no growth to date urinalysis negative nitrite.   Neurosurgery consulted recommended conservative care.  EEG negative for seizures.  There was moderate diffuse encephalopathy.  Maintained on Keppra for seizure prophylaxis.  Hospital course further complicated by dysphagia started on dysphagia #1 thin liquid diet.  Bouts of urinary retention Foley catheter tube placed anticipating voiding trial.  Palliative care continue to follow to establish goals of care.  Therapy evaluations completed due to patient's altered mental status decreased functional mobility was admitted for a comprehensive rehab program.   Hospital Course: Shaun Hobbs was admitted to rehab 03/18/2020 for inpatient therapies to consist of PT, ST and OT at least three hours five days a week. Past admission physiatrist, therapy team and rehab RN have worked together to provide customized collaborative inpatient rehab.  Pertaining to patient's nontraumatic left SDH continue to follow neurosurgery conservative care.  Maintained on Keppra for seizure prophylaxis.  Patient with noted myelodysplastic syndrome thrombocytopenia followed by hematology service Dr. Irene Limbo.  Orders were given from hematology services to transfuse with platelets less than 20 and patient was transfused 2 times while on rehab services in the past week and latest hemoglobin 7.3 and platelet count 52,000.  Palliative care continue to follow.  Synthroid ongoing for hypothyroidism.  Noted history of dementia continue on Aricept.  BPH with urinary retention Flomax as indicated he was also started on Keflex empirically for UTI with positive nitrite.  His diet has been advanced to dysphagia #2 thin liquid with minimal p.o. intake.   Blood pressures were monitored on TID basis and soft and monitored  Diabetes has been monitored with ac/hs CBG checks and SSI was use prn for tighter BS control.    Rehab course: During patient's stay in rehab weekly team conferences were held to monitor patient's progress, set goals and discuss  barriers to discharge. At admission, patient required.  Minimal guard sit to stand minimal guard stand pivot transfers minimal assist 150 feet rolling walker.  Minimal assist upper body dressing minimal assist lower body dressing minimal assist toilet transfers  Physical exam.  Blood pressure 106/60 pulse 64 temperature 98.4 respiration 21 oxygen saturation 95% room air Constitutional.  No acute distress HEENT Head.  Normocephalic and atraumatic Eyes.  Pupils round and reactive to light no discharge without nystagmus Neck.  Supple nontender no JVD without thyromegaly Cardiac regular rate rhythm no extra sounds or murmur heard Abdomen.  Soft nontender positive bowel sounds without rebound Musculoskeletal normal range of motion Neurologic.  Alert he would mostly stare limited verbal output moves all extremities exam limited due to patient's dementia  He/She  has had improvement in activity tolerance, balance, postural control as well as ability to compensate for deficits. He/She has had improvement in functional use RUE/LUE  and RLE/LLE as well as improvement in awareness.  Therapy is ongoing demonstrating significant cognitive impairments delay in processing impaired initiation impaired awareness impaired memory that affects his safety.  He was performing supine to sit with contact-guard assist sit to stand and stand pivot transfers using rolling walker contact-guard assist ambulating 150 feet rolling walker contact-guard assist.  As patient continued to be followed by palliative care Long discussions held with wife and family in regards to patient's quality of care family had requested to stop all transfusions that he had been receiving consistently for myelodysplastic syndrome and recommendations by palliative care to transfer to hospice care.       Disposition: Transfer to hospice care accepted at [facility] , to be transported on 03/28/2020    Diet: Dysphagia #2 thin liquids  Special  Instructions: All medication changes would be made as per hospice team  30-35 minutes were spent completing discharge summary and discharge planning     Follow-up Information    Caydon Feasel, Luanna Salk, MD Follow up.   Specialty: Physical Medicine and Rehabilitation Why: Office to call for appointment Contact information: [place] [address] [phone]        Brunetta Genera, MD Follow up.   Specialties: Hematology, Oncology Why: Call for appointment Contact information: [place] [address] [phone]        Judith Part, MD Follow up.   Specialty: Neurosurgery Why: Call for appointment Contact information: [place] [address] [phone]               Signed: Cathlyn Parsons 03/27/2020, 5:32 AM

## 2020-03-27 NOTE — Progress Notes (Signed)
Manufacturing engineer [facility]) Hospital Liaison note.    Received request from [place] for family interest in [facility]. Spoke with pt's son Lorenda Ishihara to confirm interest.  Chart and pt information under review by [facility] physician.  Hospice eligibility pending at this time.  [facility] is unable to offer a room today. Hospital Liaison will follow up tomorrow or sooner if a room becomes available. Please do not hesitate to call with questions.    Thank you for the opportunity to participate in this patient's care.  Domenic Moras, BSN, RN [facility] Liaison (listed on [place] under Hospice/[facility])    [phone]  (24h on call)

## 2020-03-27 NOTE — Progress Notes (Signed)
[facility] PHYSICAL MEDICINE & REHABILITATION PROGRESS NOTE   Subjective/Complaints:  Appreciate palliative care consult f/u , plt    ROS: Limited due to cognitive/behavioral   Objective:   No results found. Recent Labs    03/26/20 0508  WBC 2.4*  HGB 7.1*  HCT 21.2*  PLT 16*   No results for input(s): NA, K, CL, CO2, GLUCOSE, BUN, CREATININE, CALCIUM in the last 72 hours.  Intake/Output Summary (Last 24 hours) at 03/27/2020 0747 Last data filed at 03/27/2020 0600 Gross per 24 hour  Intake 351.23 ml  Output 700 ml  Net -348.77 ml        Physical Exam: Vital Signs Blood pressure (!) 109/59, pulse 66, temperature 98.4 F (36.9 C), temperature source Oral, resp. rate 16, height 5\' 10"  (1.778 m), weight 69 kg, SpO2 95 %.   General: No acute distress Mood and affect are appropriate Heart: Regular rate and rhythm no rubs murmurs or extra sounds Lungs: Clear to auscultation, breathing unlabored, no rales or wheezes Abdomen: Positive bowel sounds, soft nontender to palpation, nondistended Extremities: No clubbing, cyanosis, or edema Skin: No evidence of breakdown, no evidence of rash  GU: foley in place, urine pink/orange in color Musculoskeletal:     Cervical back: Normal range of motion and neck supple.     Comments: No edema or tenderness in extremities  Skin:    General: Skin is warm and dry.  Neurological:     Mental Status: He is alert.   delayed. Follows simple commands    Assessment/Plan: 1. Functional deficits which require 3+ hours per day of interdisciplinary therapy in a comprehensive inpatient rehab setting.  Physiatrist is providing close team supervision and 24 hour management of active medical problems listed below.  Physiatrist and rehab team continue to assess barriers to discharge/monitor patient progress toward functional and medical goals  Care Tool:  Bathing    Body parts bathed by patient: Right arm,Left arm,Chest,Abdomen,Right  upper leg,Left upper leg,Right lower leg,Left lower leg,Face,Front perineal area   Body parts bathed by helper: Buttocks Body parts n/a: Front perineal area,Buttocks   Bathing assist Assist Level: Minimal Assistance - Patient > 75%     Upper Body Dressing/Undressing Upper body dressing   What is the patient wearing?: Pull over shirt    Upper body assist Assist Level: Supervision/Verbal cueing    Lower Body Dressing/Undressing Lower body dressing      What is the patient wearing?: Pants,Incontinence brief     Lower body assist Assist for lower body dressing: Moderate Assistance - Patient 50 - 74%     Toileting Toileting    Toileting assist Assist for toileting: Minimal Assistance - Patient > 75%     Transfers Chair/bed transfer  Transfers assist     Chair/bed transfer assist level: Minimal Assistance - Patient > 75% Chair/bed transfer assistive device: Programmer, multimedia   Ambulation assist      Assist level: Minimal Assistance - Patient > 75% Assistive device: Walker-rolling Max distance: 75'   Walk 10 feet activity   Assist     Assist level: Contact Guard/Touching assist Assistive device: Walker-rolling   Walk 50 feet activity   Assist    Assist level: Contact Guard/Touching assist Assistive device: Walker-rolling    Walk 150 feet activity   Assist    Assist level: Contact Guard/Touching assist Assistive device: Walker-rolling    Walk 10 feet on uneven surface  activity   Assist     Assist level: Minimal  Assistance - Patient > 75% Assistive device: Hand held assist   Wheelchair     Assist Will patient use wheelchair at discharge?: No             Wheelchair 50 feet with 2 turns activity    Assist            Wheelchair 150 feet activity     Assist          Blood pressure (!) 109/59, pulse 66, temperature 98.4 F (36.9 C), temperature source Oral, resp. rate 16, height 5\' 10"  (1.778  m), weight 69 kg, SpO2 95 %.    Medical Problem List and Plan: 1.  Progressive weakness with altered mental status secondary to nontraumatic left SDH             -patient may shower             -ELOS/Goals: 10-14 days/supervision/min a             Continue CIR PT, OT, SLP, discussed functional prognosis with pt's wife via phone on 2/16, have indicated that pt will likely need 24/7 supervision going forward and min A for certain tasks, wife indicates difficulty in providing physical assist   Hold PT< OT for plt <20 , may resume after plt transfusion- recheck CBC today  Pt with increasing freq of plt transfusions indicating end stage myelodysplastic d/o  Awaiting hospice bed, change therapies to qd 2.  Antithrombotics: -DVT/anticoagulation: SCDs             -antiplatelet therapy: N/A 3. Pain Management: Tylenol as needed 4. Mood: Aricept 10 mg daily             -antipsychotic agents: N/A 5. Neuropsych: This patient is not capable of making decisions on his own behalf. 6. Skin/Wound Care: Routine skin checks 7. Fluids/Electrolytes/Nutrition: Routine in and outs             CMP reviewed, significant for hypoglycemia on 2/10.  8.  Dysphagia.  Dysphagia #1 thin liquid diet             Advance diet as tolerated  Discussed incorporating high protein foods as much as possible.  9.  Seizure prophylaxis.   -keppra decreased to 500mg  daily starting 2/12 10.  Myelodysplastic syndrome/thrombocytopenia.  Latest platelet count 52,000.  Follow-up outpatient Dr. Irene Limbo.  Continue Amicar 1250 mg 3 times daily  -transfuse for platelet ct <20k             2/13 Plt 17K, up to 38K 2/14  Requiring plt transfusions ~2x per week, wife requesting Palliative Medicine to follow up with her  15. Hypothyroidism: Synthroid 12.  BPH/urinary retention/hematuria.    Flomax 0.4 mg nightly.  urine is blood tinged no Hgb drop              still req I/O caths, which have often been traumatic   -foley replaced last night  2/12   -dc urecholine for now 13. Anemia: Hgb reviewed, 8.0 on 2/11, 7.8 2/12 and 2/13, 7.7 on 2/14 stable     14.  UTI + UA but no cx results- will do 5 d course of keflex  LOS: 9 days A FACE TO FACE EVALUATION WAS PERFORMED  Charlett Blake 03/27/2020, 7:47 AM

## 2020-03-27 NOTE — Progress Notes (Signed)
Occupational Therapy Discharge Summary  Patient Details  Name: Shaun Hobbs MRN: [id number] Date of Birth: 1928-08-28  Today's Date: 03/27/2020 OT Individual Time: 1450-1534 OT Individual Time Calculation (min): 44 min   Session Notes:  Pt completed bathing and dressing during session.  Supervision for supine to sit EOB with min assist for ambulation to the bathroom with hand held assist.  He was able to remove UB clothing with supervision and then worked on bathing with max instructional cueing and min assist overall.  He then transferred out to the EOB with min assist for dressing.  Mod assist for donning brief and pants with supervision for donning pullover shirt.  Finished session with pt transitioning to supine with supervision.  Pt's spouse present as well as Clinical biochemist.  Patient has met 2 of 11 long term goals due to improved activity tolerance and improved balance.  Patient to discharge at [facility] Assist level.  Patient's care partner unavailable to provide the necessary physical and cognitive assistance at discharge.    Reasons goals not met: Pt discharging sooner than originally expected to [facility] secondary to medical issues.  Needs min assist for transfers, LB selfcare, and dynamic standing balance.    Recommendation:  No further OT needs at this time as pt is discharging to Hospice services  Equipment: No equipment provided  Reasons for discharge: discharge from hospital  Patient/family agrees with progress made and goals achieved: Yes  OT Discharge Precautions/Restrictions  Precautions Precautions: Fall;Other (comment) Precaution Comments: impaired awareness and delayed verbal response Restrictions Weight Bearing Restrictions: No  Pain Pain Assessment Pain Scale: Faces Pain Score: 0-No pain ADL ADL Eating: Minimal assistance Where Assessed-Eating: Chair Grooming: Supervision/safety Where Assessed-Grooming: Edge of bed Upper Body Bathing:  Supervision/safety Where Assessed-Upper Body Bathing: Chair,Shower Lower Body Bathing: Minimal assistance Where Assessed-Lower Body Bathing: Chair,Shower Upper Body Dressing: Supervision/safety Where Assessed-Upper Body Dressing: Chair Lower Body Dressing: Minimal assistance Where Assessed-Lower Body Dressing: Edge of bed Toileting: Minimal assistance Where Assessed-Toileting: Bedside Commode Toilet Transfer: Minimal assistance Toilet Transfer Method: Counselling psychologist: Bedside commode,Raised toilet seat Tub/Shower Transfer: Not assessed Social research officer, government: Minimal assistance Social research officer, government Method: Heritage manager: Radio broadcast assistant Vision Baseline Vision/History: Wears glasses Wears Glasses: At all times Patient Visual Report: No change from baseline Vision Assessment?: No apparent visual deficits Perception  Perception: Not tested Comments: unable to assess completely d/t cognition Praxis Praxis: Impaired Praxis Impairment Details: Initiation;Motor planning Cognition Overall Cognitive Status: Impaired/Different from baseline Arousal/Alertness: Awake/alert Orientation Level: Disoriented to place;Disoriented to time;Disoriented to situation Attention: Focused;Sustained Focused Attention: Appears intact Sustained Attention: Impaired Sustained Attention Impairment: Verbal basic;Functional basic Selective Attention: Impaired Selective Attention Impairment: Verbal basic;Functional basic Memory: Impaired Memory Impairment: Storage deficit;Retrieval deficit Awareness: Impaired Awareness Impairment: Emergent impairment Problem Solving: Impaired Problem Solving Impairment: Functional basic;Verbal basic Safety/Judgment: Impaired Comments: Pt with delayed intiation to tasks physically and verbally. Sensation Sensation Light Touch: Appears Intact Hot/Cold: Appears Intact Proprioception: Appears Intact Stereognosis: Not  tested Additional Comments: Sensation not formally assessed but generalized based on being able to determine hot and cold in the shower as well as with functional use. Coordination Gross Motor Movements are Fluid and Coordinated: Yes (Slower movements overall but WFLs with functional use during selfcare tasks) Fine Motor Movements are Fluid and Coordinated: Yes Motor  Motor Motor: Other (comment) (generalized weakness) Motor - Skilled Clinical Observations: Generalized weakness Motor - Discharge Observations: generalized weakness Mobility  Bed Mobility Bed Mobility: Supine to Sit;Sit to Supine Supine  to Sit: Supervision/Verbal cueing Sit to Supine: Supervision/Verbal cueing Transfers Sit to Stand: Contact Guard/Touching assist Stand to Sit: Contact Guard/Touching assist  Trunk/Postural Assessment  Cervical Assessment Cervical Assessment: Exceptions to [facility] (forward head) Thoracic Assessment Thoracic Assessment: Exceptions to [facility] (thoracic rounding) Lumbar Assessment Lumbar Assessment: Exceptions to [facility] (posterior pelvic tilt) Postural Control Postural Control: Deficits on evaluation Righting Reactions: delayed and inadequate with poor balance recovery strategy as pt often reaching for support from items in environment Postural Limitations: decreased  Balance Balance Balance Assessed: Yes Static Sitting Balance Static Sitting - Balance Support: Feet supported Static Sitting - Level of Assistance: 5: Stand by assistance Dynamic Sitting Balance Dynamic Sitting - Balance Support: During functional activity Dynamic Sitting - Level of Assistance: 5: Stand by assistance Static Standing Balance Static Standing - Balance Support: During functional activity;Bilateral upper extremity supported Static Standing - Level of Assistance: 5: Stand by assistance Dynamic Standing Balance Dynamic Standing - Balance Support: During functional activity Dynamic Standing - Level of Assistance: 4:  Min assist Extremity/Trunk Assessment RUE Assessment RUE Assessment: Within Functional Limits Active Range of Motion (AROM) Comments: WFLS for gross assessment during selfcare tasks General Strength Comments: Strength at least 3+/5 throughout noted during functional use, but not formally assessed secondary to decreased cognition. LUE Assessment LUE Assessment: Within Functional Limits Active Range of Motion (AROM) Comments: AROM WFLs for selfcare tasks. General Strength Comments: Strength at least 3+/5 throughout noted during functional use, but not formally assessed secondary to decreased cognition.   Curt Oatis OTR/L 03/27/2020, 4:34 PM

## 2020-03-27 NOTE — Progress Notes (Signed)
Foley care and chlorhexidine bath done. Pt tolerated well. RN will continue to monitor.

## 2020-03-27 NOTE — Progress Notes (Signed)
Speech Language Pathology Discharge Summary  Patient Details  Name: Shaun Hobbs MRN: [id number] Date of Birth: November 12, 1928  Today's Date: 03/27/2020 SLP Individual Time: 0800-0900 SLP Individual Time Calculation (min): 60 min   Skilled Therapeutic Interventions:  Pt was seen for skilled ST targeting goals for dysphagia and cognition.  Upon arrival, pt was sitting up in bed, awake, alert, and agreeable to participate in treatment.  Pt presented with slowed processing and task initiation which required increased time for self feeding of breakfast meal.  Pt also needed min verbal cues to sustain his attention to meal.  Pt consumed dys 2 textures and thin liquids with no overt s/s of aspiration.  He required increased time and min verbal cues to clear residue from the oral cavity.  He also required encouragement to sit at the edge of the bed to complete oral care after his meal as he was noted to try to lay down immediately after coming to edge of bed.  Pt ws able to complete oral care with min assist verbal cues to attend to task as well as for task sequencing/organization following set up of necessary task items.  Pt's wife arrived and was educated regarding role of SLP and rationale for SLP interventions in response to her questions.  She voiced concerns regarding pt's limited PO intake to which SLP listened and provided support.  Per report, pt is awaiting placement at [facility] for residential hospice care as family has elected to pursue comfort measures.  As a result, no further ST needs are indicated at this time.        Patient has met 3 of 7 long term goals.  Patient to discharge at overall Mod;Max level.  Reasons goals not met: pt experienced a functional decline, now transitioning to residential hospice   Clinical Impression/Discharge Summary:   Pt has made limited gains while inpatient and is discharging having met 3 out of 7  long term goals.  Pt is currently mod-max for tasks due to  significant cognitive deficits and is consuming a dys 2, thin liquids diet with min assist for use of swallowing precautions.  Pt and family education is complete at this time as pt is now discharging to a residential hospice facility.    Care Partner:  Caregiver Able to Provide Assistance: Yes  Type of Caregiver Assistance:  (residential hospice)  Recommendation:  None  Rationale for SLP Follow Up: Other (comment) (n/a)   Equipment: none recommended by SLP   Reasons for discharge: Discharged from hospital;Change in medical status   Patient/Family Agrees with Progress Made and Goals Achieved: Yes    Basya Casavant, Selinda Orion 03/27/2020, 12:36 PM

## 2020-03-27 NOTE — Progress Notes (Signed)
Patient ID: Shaun Hobbs, male   DOB: 1928-07-29, 87 y.o.   MRN: [id number]   SW followed up with family to discuss new d/c plan. Family and patient in agreement with transition to hospice house at d/c. SW updated team.  QD therapy change. SW will update team once bed is ready for patient at [facility].   [place], [place]

## 2020-03-27 NOTE — Progress Notes (Signed)
Patient ID: [name], male   DOB: Jan 20, 1929, 87 y.o.   MRN: [id number]   SW followed up with patient spouse in room. Informed spouse transfer potentially will happen over the weekend. Spouse pleased. If patient does not d/c over weekend, sw will follow up with patient family and [place] on Monday. Spouse and family able to reach out to SW over weekend. Discharge packet left at nursing station if patient transfers. Please add completed discharge summary and schedule [facility] transportation at [phone] once transfer date and time is confirmed. For any additional questions or concerns please feel free to reach SW cell at [phone].  [place], [place]

## 2020-03-27 NOTE — Progress Notes (Signed)
Chaplain visited with patient and his wife. Patient is waiting for a bed at [facility]. Wife seemed very resigned to patient's condition and what is best for him. Chaplain offered prayer and emotional support.

## 2020-03-27 NOTE — Progress Notes (Signed)
Manufacturing engineer [facility])  Chart reviewed and eligibility confirmed for [facility]. Spoke with family to confirm interest and explain services. Family agreeable to transfer tomorrow morning. TOC aware.    Registration paperwork has been completed. Transportation can be arranged for pick up tomorrow after 9am.   RN please call report to [phone].  Please be sure the signed [facility] DNR for transports with the patient.   Thank you for the opportunity to participate in this patient's care.  Domenic Moras, BSN, RN [place] (listed on [facility] under Hospice[facility])    [phone] (liaison team line) [phone]    (24h on call)

## 2020-03-27 NOTE — Progress Notes (Signed)
Physical Therapy Discharge Summary  Patient Details  Name: Shaun Hobbs MRN: [id number] Date of Birth: 1928-02-10  Patient has met 3 of 9 long term goals due to improved balance.  Patient to discharge at an ambulatory level Supervision.   Patient's care partner unavailable and Patient is d/cing ot Hospice to provide the necessary physical and cognitive assistance at discharge.  Reasons goals not met: Patient with unexpected d/c to Hospice and is unable to continue progressing toward his goals.   Recommendation:  Patient will benefit from ongoing skilled PT services in d/c to Hospice to continue to advance safe functional mobility.   Equipment: d/c to Hospice  Reasons for discharge: change in medical status and dc to Hospice  Patient/family agrees with progress made and goals achieved: Yes  PT Discharge Precautions/Restrictions Precautions Precautions: Fall;Other (comment) Precaution Comments: impaired awareness and delayed verbal response Restrictions Weight Bearing Restrictions: No Vision/Perception  Perception Perception: Not tested Comments: unable to assess completely d/t cognition Praxis Praxis: Impaired Praxis Impairment Details: Initiation;Motor planning  Cognition Overall Cognitive Status: History of cognitive impairments - at baseline Arousal/Alertness: Awake/alert Orientation Level: Oriented to person Attention: Sustained Sustained Attention: Impaired Sustained Attention Impairment: Verbal basic;Functional basic Selective Attention: Impaired Memory: Impaired Memory Impairment: Storage deficit;Retrieval deficit Awareness: Impaired Awareness Impairment: Emergent impairment Problem Solving: Impaired Problem Solving Impairment: Functional basic;Verbal basic Safety/Judgment: Impaired Comments: Pt with delayed intiation to tasks physically and verbally. Sensation Sensation Light Touch: Not tested Hot/Cold: Not tested Proprioception: Not tested Stereognosis:  Not tested Additional Comments: Pt unable to follow directions for sensory testing. Coordination Gross Motor Movements are Fluid and Coordinated: Yes Fine Motor Movements are Fluid and Coordinated: Yes Motor  Motor Motor: Abnormal postural alignment and control;Other (comment) Motor - Skilled Clinical Observations: Generalized weakness Motor - Discharge Observations: generalized weakness  Mobility Bed Mobility Bed Mobility: Supine to Sit;Sit to Supine Supine to Sit: Supervision/Verbal cueing Sit to Supine: Supervision/Verbal cueing Transfers Transfers: Sit to Stand;Stand to Sit;Stand Pivot Transfers Sit to Stand: Supervision/Verbal cueing Stand to Sit: Supervision/Verbal cueing Stand Pivot Transfers: Contact Guard/Touching assist Stand Pivot Transfer Details: Verbal cues for sequencing;Visual cues/gestures for sequencing;Verbal cues for technique;Verbal cues for precautions/safety;Tactile cues for posture;Tactile cues for sequencing;Tactile cues for weight shifting;Tactile cues for placement Transfer (Assistive device): Rolling walker Locomotion  Gait Ambulation: Yes Gait Assistance: Contact Guard/Touching assist Gait Distance (Feet): 150 Feet Assistive device: Rolling walker Gait Assistance Details: Verbal cues for safe use of DME/AE;Verbal cues for sequencing;Verbal cues for gait pattern;Verbal cues for precautions/safety;Verbal cues for technique Gait Gait: Yes Gait Pattern: Impaired Gait Pattern: Wide base of support;Poor foot clearance - left;Poor foot clearance - right;Decreased step length - left;Decreased step length - right Stairs / Additional Locomotion Stairs: Yes Stairs Assistance: Contact Guard/Touching assist Stair Management Technique: Two rails Number of Stairs: 4 Height of Stairs: 6 Ramp: Contact Guard/touching assist Curb: Contact Guard/Touching assist Wheelchair Mobility Wheelchair Mobility: No  Trunk/Postural Assessment  Cervical Assessment Cervical  Assessment: Exceptions to [facility] (forward head) Thoracic Assessment Thoracic Assessment: Exceptions to [facility] (increased kyphosis) Lumbar Assessment Lumbar Assessment: Exceptions to [facility] (posterior pelvic tilt) Postural Control Postural Control: Deficits on evaluation Righting Reactions: delayed and inadequate with poor balance recovery strategy as pt often reaching for support from items in environment Postural Limitations: decreased  Balance Balance Balance Assessed: Yes Static Sitting Balance Static Sitting - Balance Support: Feet supported Static Sitting - Level of Assistance: 5: Stand by assistance Dynamic Sitting Balance Dynamic Sitting - Balance Support: During functional activity Dynamic Sitting -  Level of Assistance: 5: Stand by assistance Static Standing Balance Static Standing - Balance Support: During functional activity Static Standing - Level of Assistance: 4: Min assist Dynamic Standing Balance Dynamic Standing - Balance Support: During functional activity Dynamic Standing - Level of Assistance: 4: Min assist Extremity Assessment      RLE Assessment RLE Assessment: Exceptions to [facility] General Strength Comments: unable to follow commands to participate in formal assessment; however, demonstrates grossly at least 3+/5 to 4/5 functionally LLE Assessment LLE Assessment: Exceptions to [facility] Active Range of Motion (AROM) Comments: [facility] General Strength Comments: unable to follow commands to participate in formal assessment; however, demonstrates grossly at least 3+/5 to 4/5 functionally  Patient grossly supervision/CGA with minimal progress toward his goals due to his cognition/level of attention to tasks. Patient and family choosing to dc to Hospice at this time.   Debbora Dus 03/27/2020, 12:42 PM

## 2020-03-27 NOTE — Progress Notes (Addendum)
Daily Progress Note   Patient Name: Shaun Hobbs       Date: 03/27/2020 DOB: 01/10/1929  Age: 87 y.o. MRN#: [id number] Attending Physician: Charlett Blake, MD Primary Care Physician: Chesley Noon, MD Admit Date: 03/18/2020  Reason for Consultation/Follow-up: Disposition, Establishing goals of care, Non pain symptom management, Pain control, Psychosocial/spiritual support and Terminal Care  Subjective: Chart review performed. Received report from primary RN - no acute concerns.  Went to visit patient at bedside - wife/Frances was present. Patient was lying in bed awake, disoriented, and unable to participate in complex medical decision making today. Patient denied pain. When asked how he was feeling, stated "I'm fine." No signs or non-verbal gestures of pain or discomfort noted. No respiratory distress, increased work of breathing, or secretions noted. Patient sleeps on and off during my visit today.  Therapeutic listening provided to [name] as she discussed events between herself and some of the medical providers yesterday that caused emotional distress - emotional support and validation provided. Reviewed and clarified goals of care, which include transitioning to [facility] as soon as a bed is available as well as discontinuing all platelet and blood product transfusions. Questions about [employer] were answered. Informed [name] that Gregary Signs NP had sent referral for BP this morning - I expressed to [name] agreement with Gregary Signs that patient will likely be accepted for residential hospice now that transfusions have been stopped. We discussed the possible need to continue PT to stay on the CIR unit until BP bed is available - [name] states she is ok with this, but does agree that the  ultimate goal is comfort. She expressed that she hasn't seen much progress since his arrival to CIR, even noted a gradual decline. She reports he has been lethargic and his PO intake has been very minimal (just a couple of bites of food). Natural disease trajectory and expectations at EOL were reviewed - [name] expressed understanding.  Offered chaplain services - she was appreciative and agreeable. She expresses they are of [place].   All questions and concerns addressed. Encouraged to call with questions and/or concerns. PMT card provided.    Length of Stay: 9  Current Medications: Scheduled Meds:  . aminocaproic acid  1,250 mg Oral TID  . cephALEXin  250 mg Oral Q8H  . Chlorhexidine  Gluconate Cloth  6 each Topical Daily  . donepezil  10 mg Oral Daily  . feeding supplement  237 mL Oral BID BM  . levETIRAcetam  500 mg Oral Daily  . levothyroxine  100 mcg Oral QAC breakfast  . tamsulosin  0.4 mg Oral QHS    Continuous Infusions:   PRN Meds: acetaminophen  Physical Exam Vitals and nursing note reviewed.  Constitutional:      General: He is not in acute distress. Pulmonary:     Effort: No respiratory distress.  Skin:    General: Skin is warm and dry.  Neurological:     Mental Status: He is lethargic, disoriented and confused.     Motor: Weakness present.  Psychiatric:        Attention and Perception: Attention normal.        Behavior: Behavior is cooperative.        Cognition and Memory: Cognition is impaired. Memory is impaired.             Vital Signs: BP (!) 109/59   Pulse 66   Temp 98.4 F (36.9 C) (Oral)   Resp 16   Ht 5\' 10"  (1.778 m)   Wt 69 kg   SpO2 95%   BMI 21.83 kg/m  SpO2: SpO2: 95 % O2 Device: O2 Device: Room Air O2 Flow Rate:    Intake/output summary:   Intake/Output Summary (Last 24 hours) at 03/27/2020 1037 Last data filed at 03/27/2020 5701 Gross per 24 hour  Intake 321.23 ml  Output 700 ml  Net -378.77 ml   LBM: Last BM  Date: 03/23/20 Baseline Weight: Weight: 69 kg Most recent weight: Weight: 69 kg       Palliative Assessment/Data: PPS 40%      Patient Active Problem List   Diagnosis Date Noted  . Palliative care by specialist   . Goals of care, counseling/discussion   . Encounter for hospice care discussion   . DNI (do not intubate)   . Subdural hematoma ([place]) 03/08/2020  . Chronic ITP (idiopathic thrombocytopenia) (HCC) 10/24/2019  . Counseling regarding advance care planning and goals of care 10/24/2019  . Benign essential hypertension 02/26/2019  . Graves disease 02/26/2019  . History of lumbar laminectomy 02/26/2019  . Hypercholesterolemia 02/26/2019  . Lumbar back pain 02/26/2019  . Thrombocytopenia ([place]) 02/26/2019  . Pneumonia due to COVID-19 virus 11/25/2018  . Myelodysplasia (myelodysplastic syndrome) ([place]) 11/25/2018  . Dementia without behavioral disturbance ([place]) 11/25/2018  . Anemia with low platelet count ([place]) 11/14/2018  . Benign prostatic hyperplasia 11/14/2018  . Memory loss 11/14/2018  . Advance care planning 01/12/2018  . Chemotherapy induced diarrhea 05/09/2017  . Early onset Alzheimer's dementia without behavioral disturbance ([place]) 05/09/2017  . IFG (impaired fasting glucose) 05/09/2017  . Bilateral impacted cerumen 04/15/2016  . Dermatitis 02/23/2016  . Bilateral carotid bruits 12/09/2015  . DNR (do not resuscitate) 12/09/2015  . Other microscopic hematuria 12/09/2015  . Hypothyroidism 12/08/2015  . Vitamin D deficiency 12/08/2015    Palliative Care Assessment & Plan   Patient Profile: 87 y.o. male  with past medical history of myelodysplastic syndrome, Alzheimer's dementia, and hypothyroidism who initially presented to the emergency department on 03/08/20 with altered mental status and generalized weakness. In the ED, platelets were < 5 and CT scan revealed subdural hematoma. Neurosurgery did not recommend surgical intervention and patient was admitted to  Internal Medicine teaching service for further management of SDH and acute encephalopathy. Hospital course was complicated by dysphagia and urinary  retention. Patient was seen by PMT and goal of care was to improve functional status with the hope of returning home. Patient was admitted to CIR on 03/18/2020 for decreased functional mobility.   PMT was contacted 03/26/20 by patient's daughter [name] requesting we be re-consulted to assist with goals of care.  Assessment: Functional deficits Progressive weakness Altered mental status Dementia Myelodysplastic syndrom    Recommendations/Plan:  Continue gentle therapies - goal is primarily comfort at this point  No further platelet or blood product infusions   Continue DNR/DNI as previously documented - durable DNR form already on shadow chart for discharge  Transfer to [facility] when bed is available - TOC and [place] liaison notified and TOC consult previously placed  Chaplain consulted for: emotional support, EOL transition   PMT will continue to follow peripherally. If there are any imminent needs please call the service directly  Goals of Care and Additional Recommendations:  Limitations on Scope of Treatment: Avoid Hospitalization, No Artificial Feeding, No Blood Transfusions, No Diagnostics, No IV Fluids and No Tracheostomy  Code Status:    Code Status Orders  (From admission, onward)         Start     Ordered   03/18/20 1603  Do not attempt resuscitation (DNR)  Continuous       Question Answer Comment  In the event of cardiac or respiratory ARREST Do not call a "code blue"   In the event of cardiac or respiratory ARREST Do not perform Intubation, CPR, defibrillation or ACLS   In the event of cardiac or respiratory ARREST Use medication by any route, position, wound care, and other measures to relive pain and suffering. May use oxygen, suction and manual treatment of airway obstruction as needed for comfort.      03/18/20  1602        Code Status History    Date Active Date Inactive Code Status Order ID Comments User Context   03/18/2020 1547 03/18/2020 1602 Full Code [id number]  Elizabeth Sauer Inpatient   03/08/2020 1452 03/18/2020 1538 DNR [id number]  Maudie Mercury, MD ED   11/25/2018 2320 11/30/2018 1612 DNR [id number]  Barb Merino, MD ED   11/25/2018 1747 11/25/2018 2320 DNR [id number]  Barb Merino, MD ED   Advance Care Planning Activity       Prognosis:   < 2 weeks now that transfusions have been stopped (last one 03/26/20)  Discharge Planning:  Hospice facility  Care plan was discussed with primary RN, TOC, [place] liaison, Dr. Letta Pate, patient's wife  Thank you for allowing the Palliative Medicine Team to assist in the care of this patient.   Total Time 50 minutes Prolonged Time Billed  no       Greater than 50%  of this time was spent counseling and coordinating care related to the above assessment and plan.  Lin Landsman, NP  Please contact Palliative Medicine Team phone at [phone] for questions and concerns.

## 2020-03-28 NOTE — Progress Notes (Signed)
Report given to [facility] from [facility]. Dr. Ranell Patrick made aware.

## 2020-03-28 NOTE — Progress Notes (Addendum)
Manufacturing engineer [facility]) [facility].  Registration paperwork has been completed. Transportation can be arranged for pick up tomorrow after 10 am.   RN please call report to [phone]--bed is assigned when report is called.  Please be sure the signed [facility] DNR for transports with the patient.   Please update family when transport arrives so they know when to expect his arrival at [facility].  Venia Carbon RN, BSN, [facility] Liaison

## 2020-03-28 NOTE — Progress Notes (Signed)
[name] PHYSICAL MEDICINE & REHABILITATION PROGRESS NOTE   Subjective/Complaints: D/c to home hospice today Patient and nurse has no concerns   ROS: Limited due to cognitive/behavioral   Objective:   No results found. Recent Labs    03/26/20 0508 03/27/20 1019  WBC 2.4* 2.8*  HGB 7.1* 7.3*  HCT 21.2* 22.8*  PLT 16* 52*   No results for input(s): NA, K, CL, CO2, GLUCOSE, BUN, CREATININE, CALCIUM in the last 72 hours.  Intake/Output Summary (Last 24 hours) at 03/28/2020 0855 Last data filed at 03/28/2020 0730 Gross per 24 hour  Intake 417 ml  Output 750 ml  Net -333 ml        Physical Exam: Vital Signs Blood pressure 119/63, pulse 61, temperature 98.3 F (36.8 C), resp. rate 20, height 5\' 10"  (1.778 m), weight 69 kg, SpO2 96 %. Gen: no distress, normal appearing HEENT: oral mucosa pink and moist, NCAT Cardio: Reg rate Chest: normal effort, normal rate of breathing Abd: soft, non-distended Ext: no edema Psych: pleasant, normal affect Skin: intact GU: foley in place, urine pink/orange in color Musculoskeletal:     Cervical back: Normal range of motion and neck supple.     Comments: No edema or tenderness in extremities  Skin:    General: Skin is warm and dry.  Neurological:     Mental Status: He is alert.   delayed. Follows simple commands    Assessment/Plan: 1. Functional deficits which require 3+ hours per day of interdisciplinary therapy in a comprehensive inpatient rehab setting.  Physiatrist is providing close team supervision and 24 hour management of active medical problems listed below.  Physiatrist and rehab team continue to assess barriers to discharge/monitor patient progress toward functional and medical goals  Care Tool:  Bathing    Body parts bathed by patient: Right arm,Left arm,Chest,Abdomen,Right upper leg,Left upper leg,Right lower leg,Left lower leg,Face,Front perineal area   Body parts bathed by helper: Buttocks Body parts n/a:  Front perineal area,Buttocks   Bathing assist Assist Level: Minimal Assistance - Patient > 75%     Upper Body Dressing/Undressing Upper body dressing   What is the patient wearing?: Pull over shirt    Upper body assist Assist Level: Supervision/Verbal cueing    Lower Body Dressing/Undressing Lower body dressing      What is the patient wearing?: Pants,Incontinence brief     Lower body assist Assist for lower body dressing: Moderate Assistance - Patient 50 - 74%     Toileting Toileting    Toileting assist Assist for toileting: Minimal Assistance - Patient > 75%     Transfers Chair/bed transfer  Transfers assist     Chair/bed transfer assist level: Minimal Assistance - Patient > 75% Chair/bed transfer assistive device: Programmer, multimedia   Ambulation assist      Assist level: Minimal Assistance - Patient > 75% Assistive device: No Device Max distance: 10'   Walk 10 feet activity   Assist     Assist level: Contact Guard/Touching assist Assistive device: Walker-rolling   Walk 50 feet activity   Assist    Assist level: Contact Guard/Touching assist Assistive device: Walker-rolling    Walk 150 feet activity   Assist    Assist level: Contact Guard/Touching assist Assistive device: Walker-rolling    Walk 10 feet on uneven surface  activity   Assist     Assist level: Minimal Assistance - Patient > 75% Assistive device: Hand held assist   Wheelchair     Assist  Will patient use wheelchair at discharge?: No             Wheelchair 50 feet with 2 turns activity    Assist            Wheelchair 150 feet activity     Assist          Blood pressure 119/63, pulse 61, temperature 98.3 F (36.8 C), resp. rate 20, height 5\' 10"  (1.778 m), weight 69 kg, SpO2 96 %.    Medical Problem List and Plan: 1.  Progressive weakness with altered mental status secondary to nontraumatic left SDH              -patient may shower             -ELOS/Goals: 10-14 days/supervision/min a             D/c with home hospice today Hold PT< OT for plt <20 , may resume after plt transfusion- recheck CBC today  Pt with increasing freq of plt transfusions indicating end stage myelodysplastic d/o 2.  Antithrombotics: -DVT/anticoagulation: SCDs             -antiplatelet therapy: N/A 3. Pain Management: Tylenol as needed 4. Mood: Aricept 10 mg daily             -antipsychotic agents: N/A 5. Neuropsych: This patient is not capable of making decisions on his own behalf. 6. Skin/Wound Care: Routine skin checks 7. Fluids/Electrolytes/Nutrition: Routine in and outs             CMP reviewed, significant for hypoglycemia on 2/10.  8.  Dysphagia.  Dysphagia #1 thin liquid diet             Advance diet as tolerated  Discussed incorporating high protein foods as much as possible.  9.  Seizure prophylaxis.   -keppra decreased to 500mg  daily starting 2/12 10.  Myelodysplastic syndrome/thrombocytopenia.  Latest platelet count 52,000.  Follow-up outpatient Dr. Irene Hobbs.  Continue Amicar 1250 mg 3 times daily  -transfuse for platelet ct <20k             2/13 Plt 17K, up to 38K 2/14  Requiring plt transfusions ~2x per week, wife requesting Palliative Medicine to follow up with her  63. Hypothyroidism: Synthroid 12.  BPH/urinary retention/hematuria.    Flomax 0.4 mg nightly.  urine is blood tinged no Hgb drop              still req I/O caths, which have often been traumatic   -foley replaced last night 2/12   -dc urecholine for now 13. Anemia: Hgb reviewed, 8.0 on 2/11, 7.8 2/12 and 2/13, 7.7 on 2/14 stable    14.  UTI + UA but no cx results- continue 5 d course of keflex    LOS: 10 days A FACE TO FACE EVALUATION WAS PERFORMED  Shaun Hobbs Shaun Hobbs 03/28/2020, 8:55 AM

## 2020-03-28 NOTE — TOC Transition Note (Signed)
Patient will discharge to: [facility] Discharge date: 03/28/20 Transport by: Corey Harold  CSW informed RN of DC and covering attending. CSW verified DNR paperwork and discharge paperwork complete. CSW contacted PTAR for patient discharge after 10:00a.  Daphine Deutscher, LCSW, [place] Social Worker II Emergency Department, [place], [place] [phone]

## 2020-03-28 NOTE — Progress Notes (Signed)
Patient discharged to [facility] per PT[facility] ; wife in room; Report given to receiving RN.

## 2020-03-30 NOTE — Progress Notes (Signed)
Inpatient Rehabilitation Care Coordinator Discharge Note  The overall goal for the admission was met for:   Discharge location: Yes, Hospice House ([place])  Length of Stay: Yes  Discharge activity level: Yes  Home/community participation: Yes  Services provided included: MD, RD, PT, OT, SLP, RN, CM, TR, Pharmacy, Neuropsych and SW  Financial Services: Medicare  Choices offered to/list presented LX:[id number]   Follow-up services arranged: Other: D/c to hospice   Comments (or additional information):  Patient/Family verbalized understanding of follow-up arrangements: Yes  Individual responsible for coordination of the follow-up plan: [name] [phone]  Confirmed correct DME delivered: Dyanne Iha 03/30/2020    Dyanne Iha

## 2020-04-07 ENCOUNTER — Inpatient Hospital Stay: Payer: Medicare Other

## 2020-04-07 DEATH — deceased

## 2020-04-21 ENCOUNTER — Other Ambulatory Visit: Payer: Medicare Other

## 2020-05-05 ENCOUNTER — Ambulatory Visit: Payer: Medicare Other | Admitting: Hematology

## 2020-05-05 ENCOUNTER — Other Ambulatory Visit: Payer: Medicare Other

## 2020-06-12 ENCOUNTER — Ambulatory Visit: Payer: Medicare Other | Admitting: Podiatry

## 2021-05-08 IMAGING — CT CT HEAD W/O CM
4 series · 16 of 47 positions shown, 18 images · non-contrast
Comparison: None

CLINICAL DATA: Mental status changes of unknown cause

EXAM:
CT HEAD WITHOUT CONTRAST
TECHNIQUE: Contiguous axial images were obtained from the base of the skull
through the vertex without intravenous contrast. Sagittal and
coronal MPR images reconstructed from axial data set.

[Series 3: head bone · axial · 0.46mm/px · z∈[-140,-104]mm · 3 of 89 slices shown]
[im 9/89  bone]
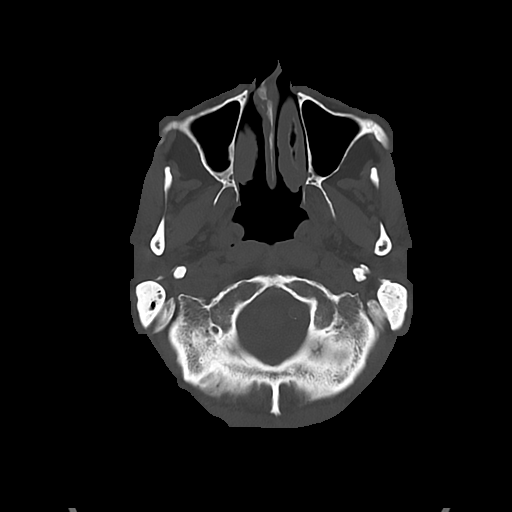
[im 18/89  bone]
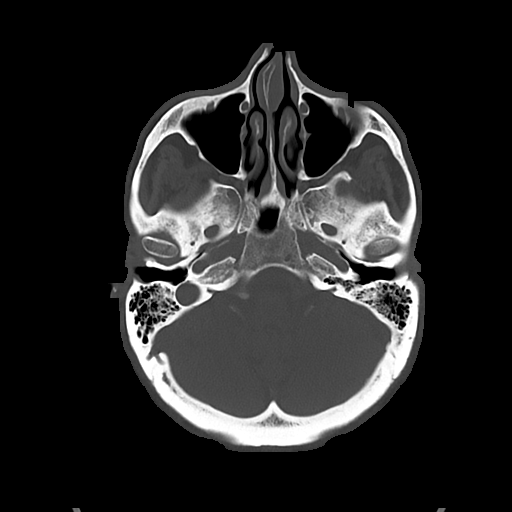
[im 27/89  bone]
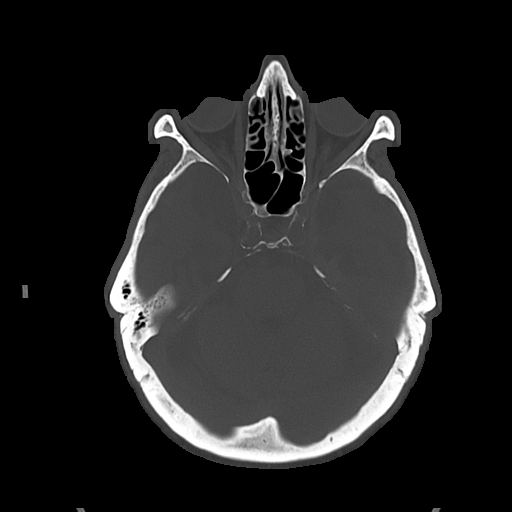

[Series 4: head wo · axial · 0.46mm/px · z∈[-136,-6]mm · 7 of 36 slices shown, 9 images]
[im 5/36  brain]
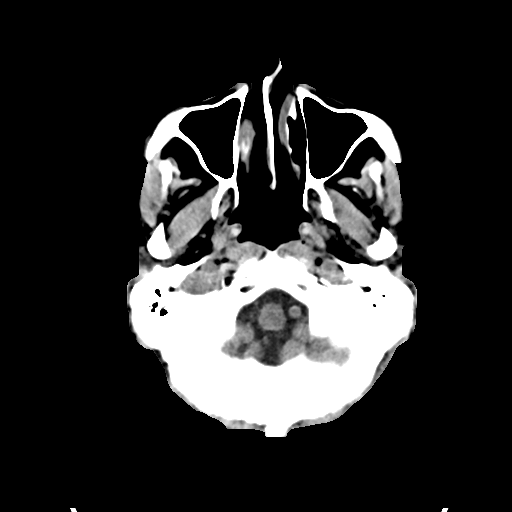
[im 5/36  bone]
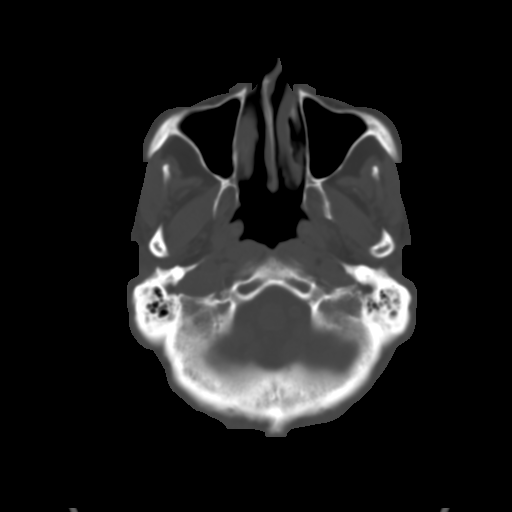
[im 9/36  brain]
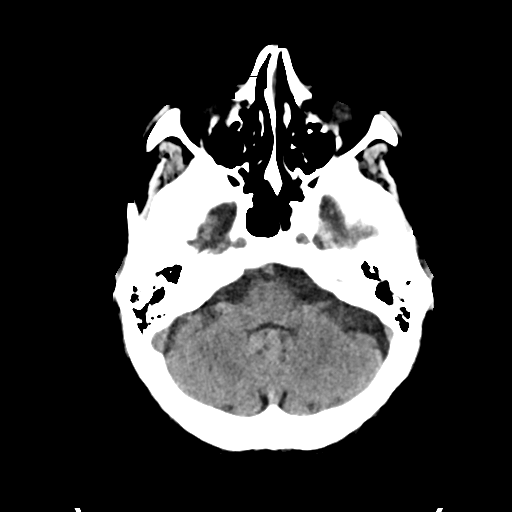
[im 14/36  brain]
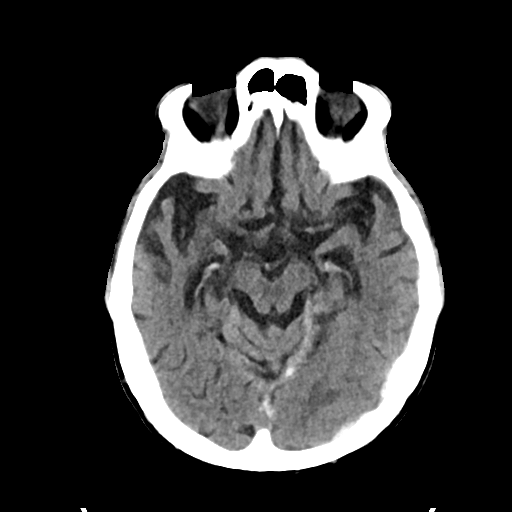
[im 18/36  brain]
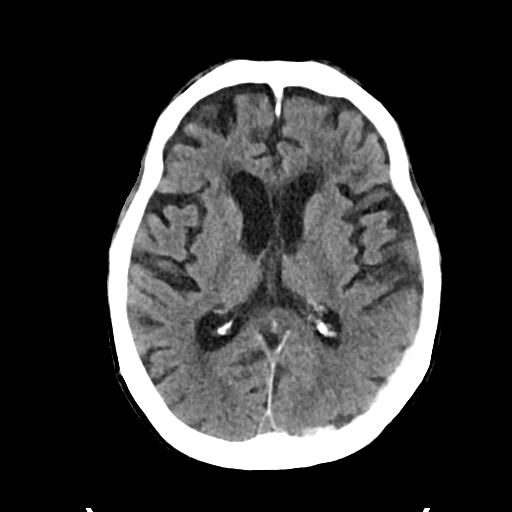
[im 22/36  brain]
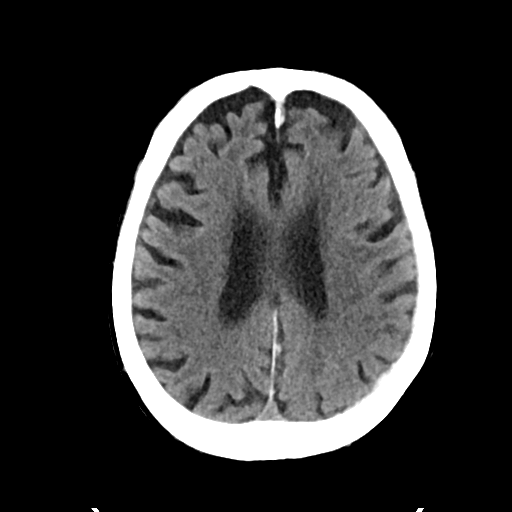
[im 22/36  bone]
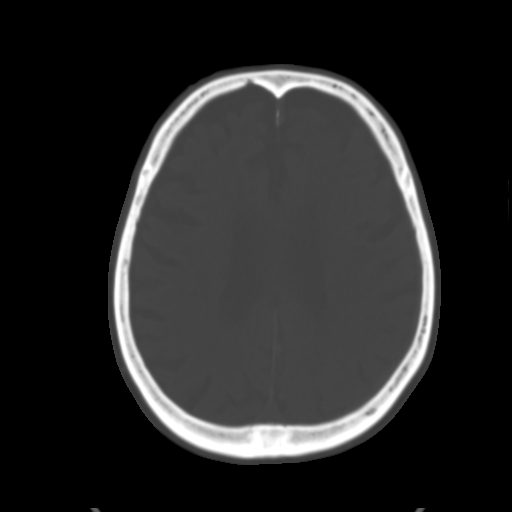
[im 27/36  brain]
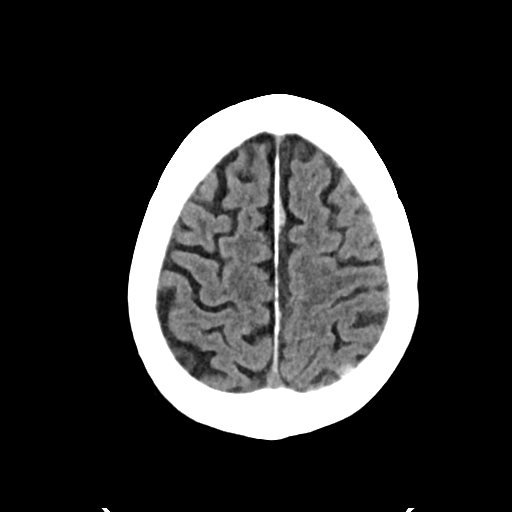
[im 31/36  brain]
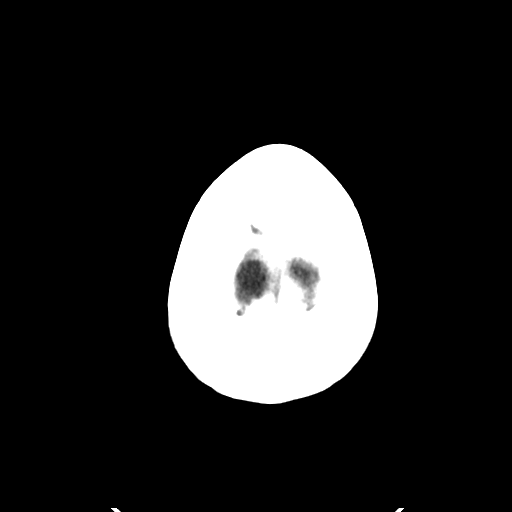

[Series 5: cor soft · coronal · 0.40mm/px · 3 of 79 slices shown]
[im 27/79  brain]
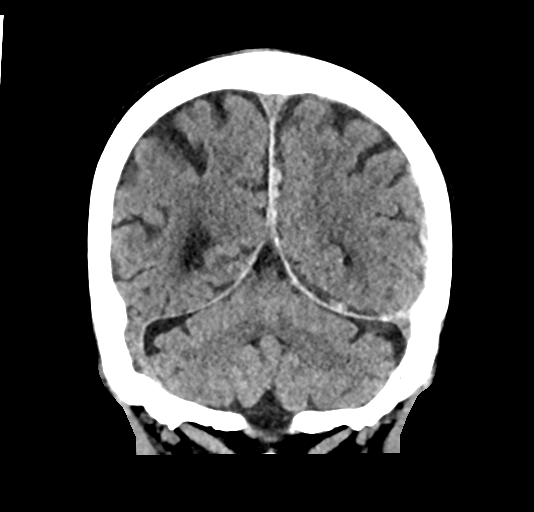
[im 35/79  brain]
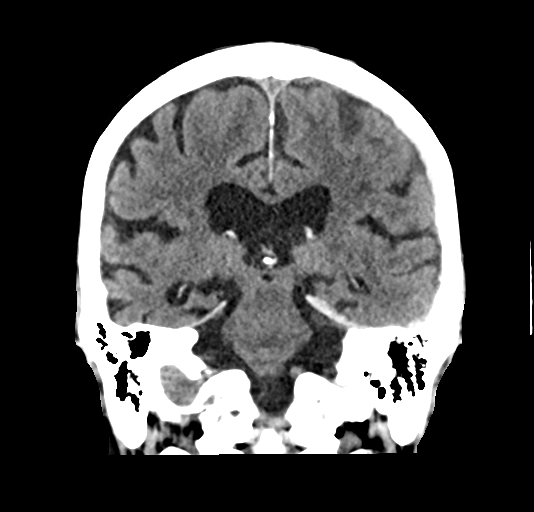
[im 44/79  brain]
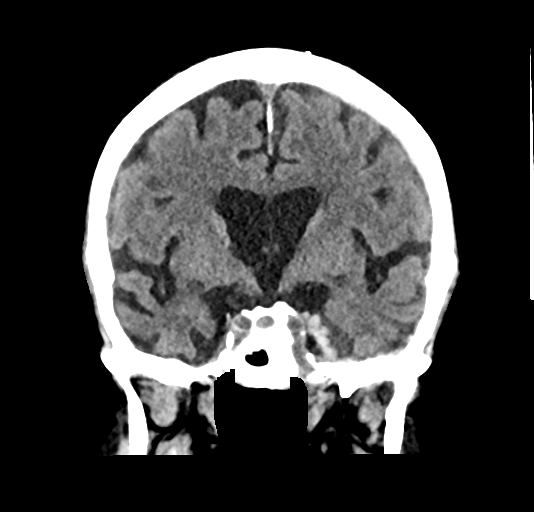

[Series 6: sag soft · sagittal · 0.37mm/px · 3 of 60 slices shown]
[im 20/60  brain]
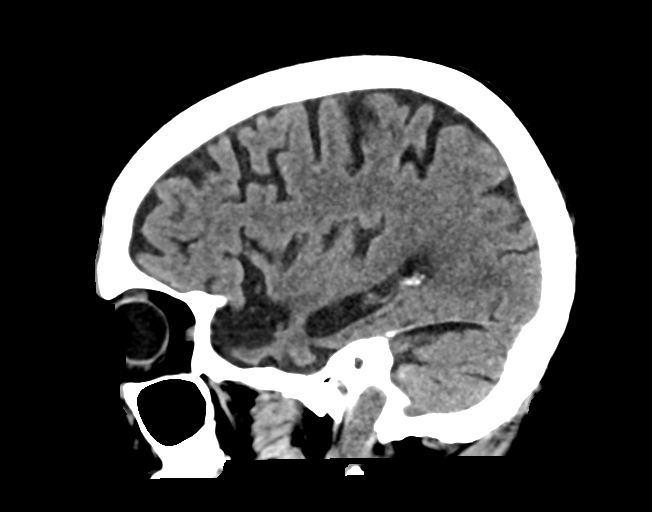
[im 30/60  brain]
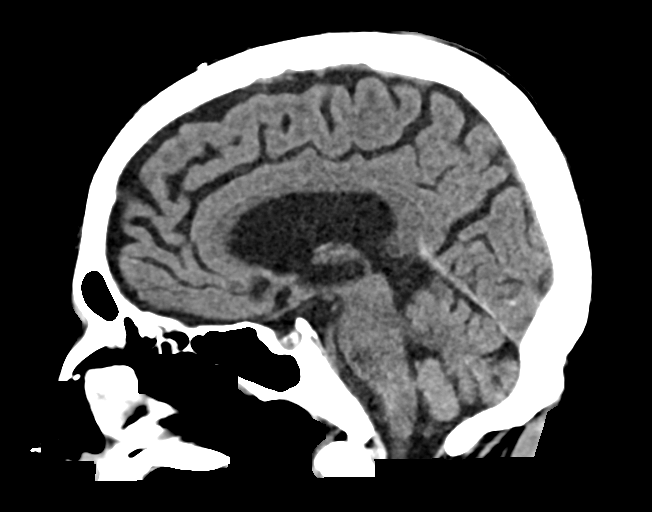
[im 40/60  brain]
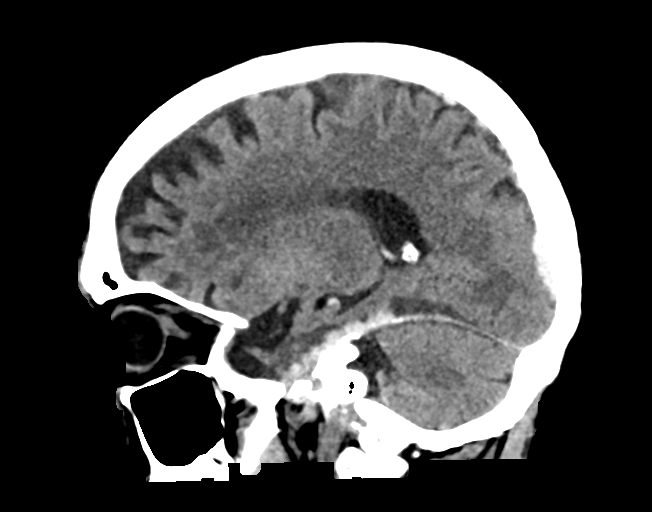

[16 of 47 positions shown; findings below may reference images not displayed]

FINDINGS: Brain: Generalized atrophy. Normal ventricular morphology. No
midline shift or mass effect. Small subdural hematoma identified
along the LEFT tentorium, extending onto LEFT falx and anteriorly
into the medial aspect of the LEFT middle cranial fossa. This
measures up to 4 mm maximal thickness. Additional small amount of
subdural blood is seen overlying the LEFT occipital lobe
posteriorly. No evidence of acute infarction or mass lesion. No
significant mass effect.

Vascular: No hyperdense vessels. Atherosclerotic calcification of
internal carotid arteries bilaterally at skull base

Skull: Intact

Sinuses/Orbits: Clear

Other: N/A
IMPRESSION: Small subdural hematoma along the LEFT tentorium, extending onto
LEFT falx and anteriorly into the medial aspect of the LEFT middle
cranial fossa as well as extending into the LEFT occipital region,
measuring up to 4 mm thick.

Additional small amount of subdural blood overlying the LEFT
occipital lobe posteriorly.

No significant mass effect or midline shift.

Atrophy with small vessel chronic ischemic changes of deep cerebral
white matter.

No acute intraparenchymal abnormalities identified.

Critical Value/emergent results were called by telephone at the time
of interpretation on 03/08/2020 at [DATE] to provider DR. NICARETTA
MORTOZA , who verbally acknowledged these results.

## 2021-05-08 IMAGING — DX DG CHEST 1V PORT
1 series · 1 of 1 positions shown · non-contrast
Comparison: Portable exam 0519 hours compared to 12/12/2018

CLINICAL DATA: Altered mental status, fever, history dementia

EXAM:
PORTABLE CHEST 1 VIEW

[chest]
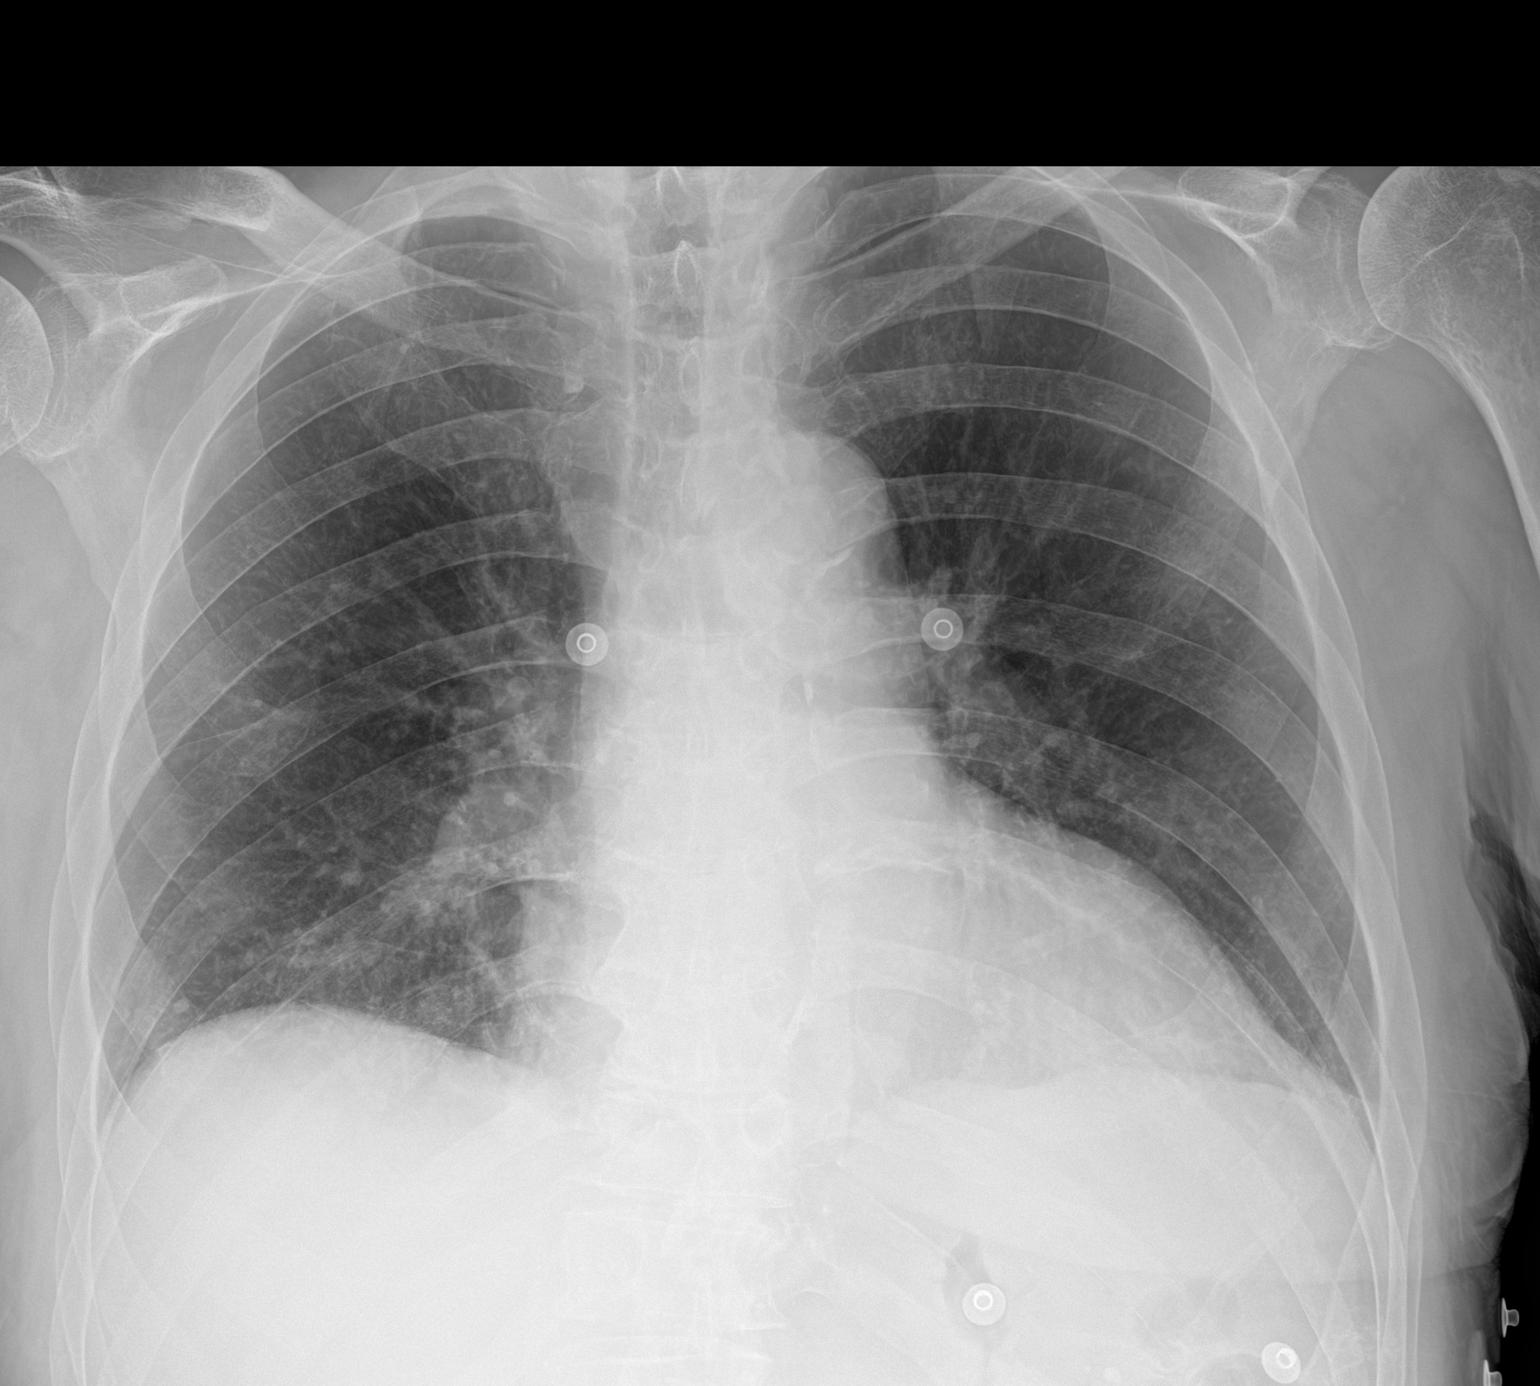

[1 of 1 positions shown; findings below may reference images not displayed]

FINDINGS: Normal heart size, mediastinal contours, and pulmonary vascularity.

Atherosclerotic calcification aorta.

Lungs clear.

No acute infiltrate, pleural effusion, or pneumothorax.

Bones demineralized.
IMPRESSION: No acute abnormalities.

Aortic Atherosclerosis ([id number]).
